# Patient Record
Sex: Male | Born: 1955 | Race: Black or African American | Hispanic: No | Marital: Single | Smoking: Never smoker
Health system: Southern US, Community
[De-identification: ages and names within clinical notes are randomized; demographics above are authoritative.]

## PROBLEM LIST (undated history)

## (undated) DIAGNOSIS — K219 Gastro-esophageal reflux disease without esophagitis: Secondary | ICD-10-CM

## (undated) DIAGNOSIS — G709 Myoneural disorder, unspecified: Secondary | ICD-10-CM

## (undated) DIAGNOSIS — E119 Type 2 diabetes mellitus without complications: Secondary | ICD-10-CM

## (undated) DIAGNOSIS — E785 Hyperlipidemia, unspecified: Secondary | ICD-10-CM

## (undated) DIAGNOSIS — M109 Gout, unspecified: Secondary | ICD-10-CM

## (undated) DIAGNOSIS — I10 Essential (primary) hypertension: Secondary | ICD-10-CM

## (undated) DIAGNOSIS — B20 Human immunodeficiency virus [HIV] disease: Secondary | ICD-10-CM

## (undated) DIAGNOSIS — G629 Polyneuropathy, unspecified: Secondary | ICD-10-CM

## (undated) DIAGNOSIS — R7301 Impaired fasting glucose: Secondary | ICD-10-CM

## (undated) DIAGNOSIS — R011 Cardiac murmur, unspecified: Secondary | ICD-10-CM

## (undated) DIAGNOSIS — I440 Atrioventricular block, first degree: Secondary | ICD-10-CM

## (undated) DIAGNOSIS — R739 Hyperglycemia, unspecified: Secondary | ICD-10-CM

## (undated) DIAGNOSIS — K429 Umbilical hernia without obstruction or gangrene: Secondary | ICD-10-CM

## (undated) DIAGNOSIS — N189 Chronic kidney disease, unspecified: Secondary | ICD-10-CM

## (undated) DIAGNOSIS — N342 Other urethritis: Secondary | ICD-10-CM

## (undated) DIAGNOSIS — Q8901 Asplenia (congenital): Secondary | ICD-10-CM

## (undated) HISTORY — DX: Asplenia (congenital): Q89.01

## (undated) HISTORY — DX: Human immunodeficiency virus (HIV) disease: B20

## (undated) HISTORY — DX: Impaired fasting glucose: R73.01

## (undated) HISTORY — DX: Hyperglycemia, unspecified: R73.9

## (undated) HISTORY — DX: Other urethritis: N34.2

## (undated) HISTORY — PX: COLONOSCOPY: SHX174

## (undated) HISTORY — DX: Hyperlipidemia, unspecified: E78.5

## (undated) HISTORY — DX: Type 2 diabetes mellitus without complications: E11.9

## (undated) HISTORY — DX: Chronic kidney disease, unspecified: N18.9

## (undated) HISTORY — DX: Gastro-esophageal reflux disease without esophagitis: K21.9

## (undated) HISTORY — DX: Essential (primary) hypertension: I10

## (undated) HISTORY — DX: Myoneural disorder, unspecified: G70.9

---

## 1991-11-24 ENCOUNTER — Encounter (INDEPENDENT_AMBULATORY_CARE_PROVIDER_SITE_OTHER): Payer: Self-pay | Admitting: *Deleted

## 1991-11-24 ENCOUNTER — Encounter: Payer: Self-pay | Admitting: Infectious Disease

## 1991-11-24 LAB — CONVERTED CEMR LAB
CD4 Count: 30 microliters
CD4 T Cell Abs: 30

## 1992-01-24 DIAGNOSIS — Z21 Asymptomatic human immunodeficiency virus [HIV] infection status: Secondary | ICD-10-CM

## 1992-01-24 DIAGNOSIS — B20 Human immunodeficiency virus [HIV] disease: Secondary | ICD-10-CM

## 1992-01-24 HISTORY — DX: Human immunodeficiency virus (HIV) disease: B20

## 1992-01-24 HISTORY — DX: Asymptomatic human immunodeficiency virus (hiv) infection status: Z21

## 1997-05-06 ENCOUNTER — Encounter: Admission: RE | Admit: 1997-05-06 | Discharge: 1997-05-06 | Payer: Self-pay | Admitting: Infectious Diseases

## 1997-05-18 ENCOUNTER — Encounter: Admission: RE | Admit: 1997-05-18 | Discharge: 1997-05-18 | Payer: Self-pay | Admitting: Infectious Diseases

## 1997-06-03 ENCOUNTER — Encounter: Admission: RE | Admit: 1997-06-03 | Discharge: 1997-06-03 | Payer: Self-pay | Admitting: Infectious Diseases

## 1997-08-12 ENCOUNTER — Encounter: Admission: RE | Admit: 1997-08-12 | Discharge: 1997-08-12 | Payer: Self-pay | Admitting: Infectious Diseases

## 1997-08-26 ENCOUNTER — Encounter: Admission: RE | Admit: 1997-08-26 | Discharge: 1997-08-26 | Payer: Self-pay | Admitting: Infectious Diseases

## 1997-10-07 ENCOUNTER — Encounter: Admission: RE | Admit: 1997-10-07 | Discharge: 1997-10-07 | Payer: Self-pay | Admitting: Infectious Diseases

## 1997-10-28 ENCOUNTER — Encounter: Admission: RE | Admit: 1997-10-28 | Discharge: 1997-10-28 | Payer: Self-pay | Admitting: Infectious Diseases

## 1997-12-09 ENCOUNTER — Encounter: Admission: RE | Admit: 1997-12-09 | Discharge: 1997-12-09 | Payer: Self-pay | Admitting: Infectious Diseases

## 1998-01-23 HISTORY — PX: SPLENECTOMY: SUR1306

## 1998-01-25 ENCOUNTER — Encounter: Admission: RE | Admit: 1998-01-25 | Discharge: 1998-01-25 | Payer: Self-pay | Admitting: Infectious Diseases

## 1998-02-22 ENCOUNTER — Encounter: Admission: RE | Admit: 1998-02-22 | Discharge: 1998-02-22 | Payer: Self-pay | Admitting: Infectious Diseases

## 1998-04-12 ENCOUNTER — Encounter: Admission: RE | Admit: 1998-04-12 | Discharge: 1998-04-12 | Payer: Self-pay | Admitting: Infectious Diseases

## 1998-05-31 ENCOUNTER — Ambulatory Visit (HOSPITAL_COMMUNITY): Admission: RE | Admit: 1998-05-31 | Discharge: 1998-05-31 | Payer: Self-pay | Admitting: Infectious Diseases

## 1998-06-14 ENCOUNTER — Encounter: Admission: RE | Admit: 1998-06-14 | Discharge: 1998-06-14 | Payer: Self-pay | Admitting: Infectious Diseases

## 1998-08-24 ENCOUNTER — Encounter: Admission: RE | Admit: 1998-08-24 | Discharge: 1998-08-24 | Payer: Self-pay | Admitting: Infectious Diseases

## 1998-08-24 ENCOUNTER — Ambulatory Visit (HOSPITAL_COMMUNITY): Admission: RE | Admit: 1998-08-24 | Discharge: 1998-08-24 | Payer: Self-pay | Admitting: Infectious Diseases

## 1998-09-06 ENCOUNTER — Encounter: Admission: RE | Admit: 1998-09-06 | Discharge: 1998-09-06 | Payer: Self-pay | Admitting: Internal Medicine

## 1998-11-04 ENCOUNTER — Ambulatory Visit (HOSPITAL_COMMUNITY): Admission: RE | Admit: 1998-11-04 | Discharge: 1998-11-04 | Payer: Self-pay | Admitting: Infectious Diseases

## 1998-11-04 ENCOUNTER — Encounter: Admission: RE | Admit: 1998-11-04 | Discharge: 1998-11-04 | Payer: Self-pay | Admitting: Hematology and Oncology

## 1998-11-22 ENCOUNTER — Encounter: Admission: RE | Admit: 1998-11-22 | Discharge: 1998-11-22 | Payer: Self-pay | Admitting: Infectious Diseases

## 1999-01-24 HISTORY — PX: LIPOSUCTION HEAD / NECK: SUR831

## 1999-01-31 ENCOUNTER — Ambulatory Visit (HOSPITAL_COMMUNITY): Admission: RE | Admit: 1999-01-31 | Discharge: 1999-01-31 | Payer: Self-pay | Admitting: Internal Medicine

## 1999-01-31 ENCOUNTER — Encounter: Admission: RE | Admit: 1999-01-31 | Discharge: 1999-01-31 | Payer: Self-pay | Admitting: Infectious Diseases

## 1999-03-21 ENCOUNTER — Encounter: Admission: RE | Admit: 1999-03-21 | Discharge: 1999-03-21 | Payer: Self-pay | Admitting: Infectious Diseases

## 1999-03-23 ENCOUNTER — Encounter: Admission: RE | Admit: 1999-03-23 | Discharge: 1999-03-23 | Payer: Self-pay | Admitting: Infectious Diseases

## 1999-04-18 ENCOUNTER — Ambulatory Visit (HOSPITAL_COMMUNITY): Admission: RE | Admit: 1999-04-18 | Discharge: 1999-04-18 | Payer: Self-pay | Admitting: Infectious Diseases

## 1999-04-18 ENCOUNTER — Encounter: Admission: RE | Admit: 1999-04-18 | Discharge: 1999-04-18 | Payer: Self-pay | Admitting: Infectious Diseases

## 1999-05-11 ENCOUNTER — Encounter: Admission: RE | Admit: 1999-05-11 | Discharge: 1999-05-11 | Payer: Self-pay | Admitting: Infectious Diseases

## 1999-07-18 ENCOUNTER — Ambulatory Visit (HOSPITAL_COMMUNITY): Admission: RE | Admit: 1999-07-18 | Discharge: 1999-07-18 | Payer: Self-pay | Admitting: Infectious Diseases

## 1999-07-18 ENCOUNTER — Encounter: Admission: RE | Admit: 1999-07-18 | Discharge: 1999-07-18 | Payer: Self-pay | Admitting: Infectious Diseases

## 1999-08-01 ENCOUNTER — Encounter: Admission: RE | Admit: 1999-08-01 | Discharge: 1999-08-01 | Payer: Self-pay | Admitting: Infectious Diseases

## 1999-09-28 ENCOUNTER — Encounter: Admission: RE | Admit: 1999-09-28 | Discharge: 1999-09-28 | Payer: Self-pay | Admitting: Infectious Diseases

## 1999-09-28 ENCOUNTER — Ambulatory Visit (HOSPITAL_COMMUNITY): Admission: RE | Admit: 1999-09-28 | Discharge: 1999-09-28 | Payer: Self-pay | Admitting: Infectious Diseases

## 1999-10-10 ENCOUNTER — Encounter: Admission: RE | Admit: 1999-10-10 | Discharge: 1999-10-10 | Payer: Self-pay | Admitting: Infectious Diseases

## 1999-11-21 ENCOUNTER — Encounter: Admission: RE | Admit: 1999-11-21 | Discharge: 1999-11-21 | Payer: Self-pay | Admitting: Infectious Diseases

## 1999-12-12 ENCOUNTER — Ambulatory Visit (HOSPITAL_COMMUNITY): Admission: RE | Admit: 1999-12-12 | Discharge: 1999-12-12 | Payer: Self-pay | Admitting: Infectious Diseases

## 1999-12-12 ENCOUNTER — Encounter: Admission: RE | Admit: 1999-12-12 | Discharge: 1999-12-12 | Payer: Self-pay | Admitting: Infectious Diseases

## 1999-12-26 ENCOUNTER — Encounter: Admission: RE | Admit: 1999-12-26 | Discharge: 1999-12-26 | Payer: Self-pay | Admitting: Infectious Diseases

## 2000-01-02 ENCOUNTER — Encounter: Admission: RE | Admit: 2000-01-02 | Discharge: 2000-01-02 | Payer: Self-pay | Admitting: Infectious Diseases

## 2000-01-09 ENCOUNTER — Encounter: Admission: RE | Admit: 2000-01-09 | Discharge: 2000-01-09 | Payer: Self-pay | Admitting: Internal Medicine

## 2000-01-18 ENCOUNTER — Encounter: Admission: RE | Admit: 2000-01-18 | Discharge: 2000-01-18 | Payer: Self-pay | Admitting: Internal Medicine

## 2000-02-06 ENCOUNTER — Ambulatory Visit (HOSPITAL_COMMUNITY): Admission: RE | Admit: 2000-02-06 | Discharge: 2000-02-06 | Payer: Self-pay | Admitting: Infectious Diseases

## 2000-02-06 ENCOUNTER — Encounter: Admission: RE | Admit: 2000-02-06 | Discharge: 2000-02-06 | Payer: Self-pay | Admitting: Infectious Diseases

## 2000-02-20 ENCOUNTER — Encounter: Admission: RE | Admit: 2000-02-20 | Discharge: 2000-02-20 | Payer: Self-pay | Admitting: Infectious Diseases

## 2000-04-23 ENCOUNTER — Ambulatory Visit (HOSPITAL_COMMUNITY): Admission: RE | Admit: 2000-04-23 | Discharge: 2000-04-23 | Payer: Self-pay | Admitting: Infectious Diseases

## 2000-04-23 ENCOUNTER — Encounter: Admission: RE | Admit: 2000-04-23 | Discharge: 2000-04-23 | Payer: Self-pay | Admitting: Internal Medicine

## 2000-05-07 ENCOUNTER — Encounter: Admission: RE | Admit: 2000-05-07 | Discharge: 2000-05-07 | Payer: Self-pay | Admitting: Infectious Diseases

## 2000-08-06 ENCOUNTER — Ambulatory Visit (HOSPITAL_COMMUNITY): Admission: RE | Admit: 2000-08-06 | Discharge: 2000-08-06 | Payer: Self-pay | Admitting: Infectious Diseases

## 2000-08-06 ENCOUNTER — Encounter: Admission: RE | Admit: 2000-08-06 | Discharge: 2000-08-06 | Payer: Self-pay | Admitting: Infectious Diseases

## 2000-08-27 ENCOUNTER — Encounter: Admission: RE | Admit: 2000-08-27 | Discharge: 2000-08-27 | Payer: Self-pay | Admitting: Infectious Diseases

## 2000-10-22 ENCOUNTER — Encounter: Admission: RE | Admit: 2000-10-22 | Discharge: 2000-10-22 | Payer: Self-pay | Admitting: Infectious Diseases

## 2000-10-22 ENCOUNTER — Ambulatory Visit (HOSPITAL_COMMUNITY): Admission: RE | Admit: 2000-10-22 | Discharge: 2000-10-22 | Payer: Self-pay | Admitting: Infectious Diseases

## 2000-11-05 ENCOUNTER — Encounter: Admission: RE | Admit: 2000-11-05 | Discharge: 2000-11-05 | Payer: Self-pay | Admitting: Infectious Diseases

## 2000-11-27 ENCOUNTER — Encounter: Admission: RE | Admit: 2000-11-27 | Discharge: 2000-11-27 | Payer: Self-pay | Admitting: Infectious Diseases

## 2000-11-27 ENCOUNTER — Ambulatory Visit (HOSPITAL_COMMUNITY): Admission: RE | Admit: 2000-11-27 | Discharge: 2000-11-27 | Payer: Self-pay | Admitting: Infectious Diseases

## 2001-01-08 ENCOUNTER — Encounter: Admission: RE | Admit: 2001-01-08 | Discharge: 2001-01-08 | Payer: Self-pay | Admitting: Infectious Diseases

## 2001-02-19 ENCOUNTER — Ambulatory Visit (HOSPITAL_COMMUNITY): Admission: RE | Admit: 2001-02-19 | Discharge: 2001-02-19 | Payer: Self-pay | Admitting: Infectious Diseases

## 2001-03-26 ENCOUNTER — Encounter: Admission: RE | Admit: 2001-03-26 | Discharge: 2001-03-26 | Payer: Self-pay | Admitting: Infectious Diseases

## 2001-05-06 ENCOUNTER — Encounter: Admission: RE | Admit: 2001-05-06 | Discharge: 2001-05-06 | Payer: Self-pay | Admitting: Infectious Diseases

## 2001-05-06 ENCOUNTER — Ambulatory Visit (HOSPITAL_COMMUNITY): Admission: RE | Admit: 2001-05-06 | Discharge: 2001-05-06 | Payer: Self-pay | Admitting: Infectious Diseases

## 2001-07-02 ENCOUNTER — Encounter: Admission: RE | Admit: 2001-07-02 | Discharge: 2001-07-02 | Payer: Self-pay | Admitting: Infectious Diseases

## 2001-09-30 ENCOUNTER — Encounter: Admission: RE | Admit: 2001-09-30 | Discharge: 2001-09-30 | Payer: Self-pay | Admitting: Infectious Diseases

## 2001-09-30 ENCOUNTER — Ambulatory Visit (HOSPITAL_COMMUNITY): Admission: RE | Admit: 2001-09-30 | Discharge: 2001-09-30 | Payer: Self-pay | Admitting: Infectious Diseases

## 2001-10-14 ENCOUNTER — Encounter: Admission: RE | Admit: 2001-10-14 | Discharge: 2001-10-14 | Payer: Self-pay | Admitting: Infectious Diseases

## 2001-11-08 ENCOUNTER — Encounter: Admission: RE | Admit: 2001-11-08 | Discharge: 2001-11-08 | Payer: Self-pay | Admitting: Internal Medicine

## 2002-01-13 ENCOUNTER — Ambulatory Visit (HOSPITAL_COMMUNITY): Admission: RE | Admit: 2002-01-13 | Discharge: 2002-01-13 | Payer: Self-pay | Admitting: Infectious Diseases

## 2002-01-13 ENCOUNTER — Encounter: Admission: RE | Admit: 2002-01-13 | Discharge: 2002-01-13 | Payer: Self-pay | Admitting: Infectious Diseases

## 2002-02-10 ENCOUNTER — Encounter: Admission: RE | Admit: 2002-02-10 | Discharge: 2002-02-10 | Payer: Self-pay | Admitting: Infectious Diseases

## 2002-06-10 ENCOUNTER — Encounter: Admission: RE | Admit: 2002-06-10 | Discharge: 2002-06-10 | Payer: Self-pay | Admitting: Infectious Diseases

## 2002-06-10 ENCOUNTER — Encounter (INDEPENDENT_AMBULATORY_CARE_PROVIDER_SITE_OTHER): Payer: Self-pay | Admitting: Infectious Diseases

## 2002-06-30 ENCOUNTER — Encounter: Admission: RE | Admit: 2002-06-30 | Discharge: 2002-06-30 | Payer: Self-pay | Admitting: Infectious Diseases

## 2002-10-06 ENCOUNTER — Encounter: Admission: RE | Admit: 2002-10-06 | Discharge: 2002-10-06 | Payer: Self-pay | Admitting: Infectious Diseases

## 2002-10-06 ENCOUNTER — Ambulatory Visit (HOSPITAL_COMMUNITY): Admission: RE | Admit: 2002-10-06 | Discharge: 2002-10-06 | Payer: Self-pay | Admitting: Infectious Diseases

## 2002-10-06 ENCOUNTER — Encounter (INDEPENDENT_AMBULATORY_CARE_PROVIDER_SITE_OTHER): Payer: Self-pay | Admitting: Infectious Diseases

## 2002-10-13 ENCOUNTER — Encounter: Admission: RE | Admit: 2002-10-13 | Discharge: 2002-10-13 | Payer: Self-pay | Admitting: Infectious Diseases

## 2002-11-07 ENCOUNTER — Encounter: Admission: RE | Admit: 2002-11-07 | Discharge: 2002-11-07 | Payer: Self-pay | Admitting: Internal Medicine

## 2003-01-12 ENCOUNTER — Encounter (INDEPENDENT_AMBULATORY_CARE_PROVIDER_SITE_OTHER): Payer: Self-pay | Admitting: Infectious Diseases

## 2003-01-12 ENCOUNTER — Encounter: Admission: RE | Admit: 2003-01-12 | Discharge: 2003-01-12 | Payer: Self-pay | Admitting: Infectious Diseases

## 2003-01-12 ENCOUNTER — Ambulatory Visit (HOSPITAL_COMMUNITY): Admission: RE | Admit: 2003-01-12 | Discharge: 2003-01-12 | Payer: Self-pay | Admitting: Infectious Diseases

## 2003-01-26 ENCOUNTER — Encounter: Admission: RE | Admit: 2003-01-26 | Discharge: 2003-01-26 | Payer: Self-pay | Admitting: Infectious Diseases

## 2003-05-20 ENCOUNTER — Ambulatory Visit (HOSPITAL_COMMUNITY): Admission: RE | Admit: 2003-05-20 | Discharge: 2003-05-20 | Payer: Self-pay | Admitting: Infectious Diseases

## 2003-06-17 ENCOUNTER — Encounter: Admission: RE | Admit: 2003-06-17 | Discharge: 2003-06-17 | Payer: Self-pay | Admitting: Infectious Diseases

## 2003-10-26 ENCOUNTER — Ambulatory Visit (HOSPITAL_COMMUNITY): Admission: RE | Admit: 2003-10-26 | Discharge: 2003-10-26 | Payer: Self-pay | Admitting: Infectious Diseases

## 2003-10-26 ENCOUNTER — Ambulatory Visit: Payer: Self-pay | Admitting: Infectious Diseases

## 2003-11-09 ENCOUNTER — Ambulatory Visit: Payer: Self-pay | Admitting: Infectious Diseases

## 2004-03-21 ENCOUNTER — Ambulatory Visit (HOSPITAL_COMMUNITY): Admission: RE | Admit: 2004-03-21 | Discharge: 2004-03-21 | Payer: Self-pay | Admitting: Infectious Diseases

## 2004-03-21 ENCOUNTER — Ambulatory Visit: Payer: Self-pay | Admitting: Infectious Diseases

## 2004-04-04 ENCOUNTER — Ambulatory Visit: Payer: Self-pay | Admitting: Infectious Diseases

## 2004-10-10 ENCOUNTER — Encounter (INDEPENDENT_AMBULATORY_CARE_PROVIDER_SITE_OTHER): Payer: Self-pay | Admitting: *Deleted

## 2004-10-10 ENCOUNTER — Ambulatory Visit: Payer: Self-pay | Admitting: Infectious Diseases

## 2004-10-10 ENCOUNTER — Ambulatory Visit (HOSPITAL_COMMUNITY): Admission: RE | Admit: 2004-10-10 | Discharge: 2004-10-10 | Payer: Self-pay | Admitting: Infectious Diseases

## 2004-10-10 LAB — CONVERTED CEMR LAB
CD4 Count: 800 uL
HIV 1 RNA Quant: 153 {copies}/mL

## 2004-10-24 ENCOUNTER — Ambulatory Visit: Payer: Self-pay | Admitting: Infectious Diseases

## 2004-11-29 ENCOUNTER — Emergency Department: Payer: Self-pay | Admitting: Emergency Medicine

## 2005-03-13 ENCOUNTER — Ambulatory Visit: Payer: Self-pay | Admitting: Infectious Diseases

## 2005-03-13 ENCOUNTER — Encounter (INDEPENDENT_AMBULATORY_CARE_PROVIDER_SITE_OTHER): Payer: Self-pay | Admitting: *Deleted

## 2005-03-13 LAB — CONVERTED CEMR LAB
CD4 Count: 860 microliters
HIV 1 RNA Quant: 399 copies/mL

## 2005-03-27 ENCOUNTER — Ambulatory Visit: Payer: Self-pay | Admitting: Infectious Diseases

## 2005-04-17 ENCOUNTER — Ambulatory Visit: Payer: Self-pay | Admitting: Infectious Diseases

## 2005-09-18 ENCOUNTER — Encounter (INDEPENDENT_AMBULATORY_CARE_PROVIDER_SITE_OTHER): Payer: Self-pay | Admitting: *Deleted

## 2005-09-18 ENCOUNTER — Ambulatory Visit: Payer: Self-pay | Admitting: Infectious Diseases

## 2005-09-18 ENCOUNTER — Encounter: Admission: RE | Admit: 2005-09-18 | Discharge: 2005-09-18 | Payer: Self-pay | Admitting: Infectious Diseases

## 2005-09-18 LAB — CONVERTED CEMR LAB
CD4 Count: 700 microliters
HIV 1 RNA Quant: 127 copies/mL

## 2005-10-02 ENCOUNTER — Ambulatory Visit: Payer: Self-pay | Admitting: Infectious Diseases

## 2005-10-03 ENCOUNTER — Emergency Department (HOSPITAL_COMMUNITY): Admission: EM | Admit: 2005-10-03 | Discharge: 2005-10-04 | Payer: Self-pay | Admitting: Emergency Medicine

## 2005-10-06 ENCOUNTER — Ambulatory Visit: Payer: Self-pay | Admitting: Internal Medicine

## 2005-10-06 ENCOUNTER — Encounter (INDEPENDENT_AMBULATORY_CARE_PROVIDER_SITE_OTHER): Payer: Self-pay | Admitting: Infectious Diseases

## 2006-03-19 ENCOUNTER — Encounter (INDEPENDENT_AMBULATORY_CARE_PROVIDER_SITE_OTHER): Payer: Self-pay | Admitting: *Deleted

## 2006-03-19 LAB — CONVERTED CEMR LAB: RPR Titer: 1:4 {titer}

## 2006-04-01 ENCOUNTER — Encounter (INDEPENDENT_AMBULATORY_CARE_PROVIDER_SITE_OTHER): Payer: Self-pay | Admitting: *Deleted

## 2006-04-02 ENCOUNTER — Telehealth (INDEPENDENT_AMBULATORY_CARE_PROVIDER_SITE_OTHER): Payer: Self-pay | Admitting: Infectious Diseases

## 2006-04-02 ENCOUNTER — Ambulatory Visit: Payer: Self-pay | Admitting: Infectious Diseases

## 2006-04-02 ENCOUNTER — Encounter: Admission: RE | Admit: 2006-04-02 | Discharge: 2006-04-02 | Payer: Self-pay | Admitting: Infectious Diseases

## 2006-04-16 ENCOUNTER — Ambulatory Visit (HOSPITAL_COMMUNITY): Admission: RE | Admit: 2006-04-16 | Discharge: 2006-04-16 | Payer: Self-pay | Admitting: Infectious Diseases

## 2006-04-16 ENCOUNTER — Ambulatory Visit: Payer: Self-pay | Admitting: Infectious Diseases

## 2006-04-16 DIAGNOSIS — B20 Human immunodeficiency virus [HIV] disease: Secondary | ICD-10-CM | POA: Insufficient documentation

## 2006-10-01 ENCOUNTER — Ambulatory Visit: Payer: Self-pay | Admitting: Infectious Disease

## 2006-10-01 ENCOUNTER — Encounter: Admission: RE | Admit: 2006-10-01 | Discharge: 2006-10-01 | Payer: Self-pay | Admitting: Infectious Disease

## 2006-10-01 LAB — CONVERTED CEMR LAB
ALT: 14 units/L (ref 0–53)
AST: 19 units/L (ref 0–37)
Albumin: 4 g/dL (ref 3.5–5.2)
Alkaline Phosphatase: 65 units/L (ref 39–117)
BUN: 16 mg/dL (ref 6–23)
Basophils Absolute: 0 10*3/uL (ref 0.0–0.1)
Basophils Relative: 1 % (ref 0–1)
CO2: 25 meq/L (ref 19–32)
Calcium: 9 mg/dL (ref 8.4–10.5)
Chloride: 104 meq/L (ref 96–112)
Creatinine, Ser: 1.3 mg/dL (ref 0.40–1.50)
Eosinophils Absolute: 0.2 10*3/uL (ref 0.0–0.7)
Eosinophils Relative: 3 % (ref 0–5)
Glucose, Bld: 97 mg/dL (ref 70–99)
HCT: 41.1 % (ref 39.0–52.0)
HIV 1 RNA Quant: 654 copies/mL — ABNORMAL HIGH (ref <50)
HIV-1 RNA Quant, Log: 2.82 — ABNORMAL HIGH (ref <1.70)
Hemoglobin: 13.4 g/dL (ref 13.0–17.0)
Lymphocytes Relative: 53 % — ABNORMAL HIGH (ref 12–46)
Lymphs Abs: 3.2 10*3/uL (ref 0.7–3.3)
MCHC: 32.6 g/dL (ref 30.0–36.0)
MCV: 99 fL (ref 78.0–100.0)
Monocytes Absolute: 0.7 10*3/uL (ref 0.2–0.7)
Monocytes Relative: 11 % (ref 3–11)
Neutro Abs: 2 10*3/uL (ref 1.7–7.7)
Neutrophils Relative %: 33 % — ABNORMAL LOW (ref 43–77)
Platelets: 473 10*3/uL — ABNORMAL HIGH (ref 150–400)
Potassium: 3.7 meq/L (ref 3.5–5.3)
RBC: 4.15 M/uL — ABNORMAL LOW (ref 4.22–5.81)
RDW: 14.3 % — ABNORMAL HIGH (ref 11.5–14.0)
Sodium: 141 meq/L (ref 135–145)
Total Bilirubin: 0.4 mg/dL (ref 0.3–1.2)
Total Protein: 8 g/dL (ref 6.0–8.3)
WBC: 6 10*3/uL (ref 4.0–10.5)

## 2006-10-08 ENCOUNTER — Encounter (INDEPENDENT_AMBULATORY_CARE_PROVIDER_SITE_OTHER): Payer: Self-pay | Admitting: *Deleted

## 2006-10-15 ENCOUNTER — Ambulatory Visit: Payer: Self-pay | Admitting: Infectious Disease

## 2006-10-15 DIAGNOSIS — N259 Disorder resulting from impaired renal tubular function, unspecified: Secondary | ICD-10-CM | POA: Insufficient documentation

## 2006-10-15 DIAGNOSIS — I10 Essential (primary) hypertension: Secondary | ICD-10-CM | POA: Insufficient documentation

## 2006-10-15 LAB — CONVERTED CEMR LAB
Creatinine, Urine: 161.6 mg/dL
HIV 1 RNA Quant: 284 copies/mL — ABNORMAL HIGH (ref <50)
HIV-1 RNA Quant, Log: 2.45 — ABNORMAL HIGH (ref <1.70)
Microalb Creat Ratio: 14.4 mg/g (ref 0.0–30.0)
Microalb, Ur: 2.32 mg/dL — ABNORMAL HIGH (ref 0.00–1.89)

## 2006-12-07 LAB — HM COLONOSCOPY: HM Colonoscopy: NORMAL

## 2006-12-14 ENCOUNTER — Encounter: Admission: RE | Admit: 2006-12-14 | Discharge: 2006-12-14 | Payer: Self-pay | Admitting: Infectious Disease

## 2006-12-14 ENCOUNTER — Ambulatory Visit: Payer: Self-pay | Admitting: Infectious Disease

## 2006-12-14 LAB — CONVERTED CEMR LAB
ALT: 15 units/L (ref 0–53)
AST: 19 units/L (ref 0–37)
Albumin: 4.1 g/dL (ref 3.5–5.2)
Alkaline Phosphatase: 79 units/L (ref 39–117)
BUN: 15 mg/dL (ref 6–23)
Basophils Absolute: 0.1 10*3/uL (ref 0.0–0.1)
Basophils Relative: 1 % (ref 0–1)
CO2: 25 meq/L (ref 19–32)
Calcium: 9.1 mg/dL (ref 8.4–10.5)
Chloride: 101 meq/L (ref 96–112)
Cholesterol: 149 mg/dL (ref 0–200)
Creatinine, Ser: 1.25 mg/dL (ref 0.40–1.50)
Eosinophils Absolute: 0.5 10*3/uL (ref 0.2–0.7)
Eosinophils Relative: 8 % — ABNORMAL HIGH (ref 0–5)
Glucose, Bld: 92 mg/dL (ref 70–99)
HCT: 42.3 % (ref 39.0–52.0)
HDL: 35 mg/dL — ABNORMAL LOW (ref >39)
HIV 1 RNA Quant: <50 copies/mL (ref <50)
HIV-1 RNA Quant, Log: <1.7 (ref <1.70)
Hemoglobin: 14 g/dL (ref 13.0–17.0)
LDL Cholesterol: 94 mg/dL (ref 0–99)
Lymphocytes Relative: 44 % (ref 12–46)
Lymphs Abs: 2.9 10*3/uL (ref 0.7–4.0)
MCHC: 33.1 g/dL (ref 30.0–36.0)
MCV: 100.2 fL — ABNORMAL HIGH (ref 78.0–100.0)
Monocytes Absolute: 1 10*3/uL (ref 0.1–1.0)
Monocytes Relative: 15 % — ABNORMAL HIGH (ref 3–12)
Neutro Abs: 2.1 10*3/uL (ref 1.7–7.7)
Neutrophils Relative %: 32 % — ABNORMAL LOW (ref 43–77)
Platelets: 413 10*3/uL — ABNORMAL HIGH (ref 150–400)
Potassium: 4.4 meq/L (ref 3.5–5.3)
RBC: 4.22 M/uL (ref 4.22–5.81)
RDW: 14.8 % (ref 11.5–15.5)
Sodium: 138 meq/L (ref 135–145)
Total Bilirubin: 0.5 mg/dL (ref 0.3–1.2)
Total CHOL/HDL Ratio: 4.3
Total Protein: 8.4 g/dL — ABNORMAL HIGH (ref 6.0–8.3)
Triglycerides: 99 mg/dL (ref <150)
VLDL: 20 mg/dL (ref 0–40)
WBC: 6.5 10*3/uL (ref 4.0–10.5)

## 2006-12-31 ENCOUNTER — Ambulatory Visit: Payer: Self-pay | Admitting: Infectious Disease

## 2006-12-31 DIAGNOSIS — F528 Other sexual dysfunction not due to a substance or known physiological condition: Secondary | ICD-10-CM | POA: Insufficient documentation

## 2007-01-07 ENCOUNTER — Ambulatory Visit: Payer: Self-pay | Admitting: Infectious Disease

## 2007-01-07 LAB — CONVERTED CEMR LAB
BUN: 18 mg/dL (ref 6–23)
CO2: 23 meq/L (ref 19–32)
Calcium: 9.2 mg/dL (ref 8.4–10.5)
Chloride: 104 meq/L (ref 96–112)
Creatinine, Ser: 1.22 mg/dL (ref 0.40–1.50)
Glucose, Bld: 83 mg/dL (ref 70–99)
Potassium: 4.6 meq/L (ref 3.5–5.3)
Sodium: 139 meq/L (ref 135–145)

## 2007-04-24 ENCOUNTER — Telehealth (INDEPENDENT_AMBULATORY_CARE_PROVIDER_SITE_OTHER): Payer: Self-pay | Admitting: *Deleted

## 2007-04-29 ENCOUNTER — Encounter (INDEPENDENT_AMBULATORY_CARE_PROVIDER_SITE_OTHER): Payer: Self-pay | Admitting: *Deleted

## 2007-05-13 ENCOUNTER — Telehealth (INDEPENDENT_AMBULATORY_CARE_PROVIDER_SITE_OTHER): Payer: Self-pay | Admitting: *Deleted

## 2007-05-13 ENCOUNTER — Encounter (INDEPENDENT_AMBULATORY_CARE_PROVIDER_SITE_OTHER): Payer: Self-pay | Admitting: *Deleted

## 2007-06-25 ENCOUNTER — Ambulatory Visit: Payer: Self-pay | Admitting: Infectious Disease

## 2007-06-25 ENCOUNTER — Encounter: Admission: RE | Admit: 2007-06-25 | Discharge: 2007-06-25 | Payer: Self-pay | Admitting: Infectious Disease

## 2007-06-25 LAB — CONVERTED CEMR LAB
ALT: 22 units/L (ref 0–53)
AST: 25 units/L (ref 0–37)
Albumin: 4.3 g/dL (ref 3.5–5.2)
Alkaline Phosphatase: 58 units/L (ref 39–117)
BUN: 18 mg/dL (ref 6–23)
Basophils Absolute: 0.1 10*3/uL (ref 0.0–0.1)
Basophils Relative: 1 % (ref 0–1)
CO2: 22 meq/L (ref 19–32)
Calcium: 9.1 mg/dL (ref 8.4–10.5)
Chloride: 107 meq/L (ref 96–112)
Creatinine, Ser: 1.34 mg/dL (ref 0.40–1.50)
Eosinophils Absolute: 0.3 10*3/uL (ref 0.0–0.7)
Eosinophils Relative: 4 % (ref 0–5)
Glucose, Bld: 86 mg/dL (ref 70–99)
HCT: 39.2 % (ref 39.0–52.0)
HIV 1 RNA Quant: 127 copies/mL — ABNORMAL HIGH (ref <50)
HIV-1 RNA Quant, Log: 2.1 — ABNORMAL HIGH (ref <1.70)
Hemoglobin: 13 g/dL (ref 13.0–17.0)
Lymphocytes Relative: 53 % — ABNORMAL HIGH (ref 12–46)
Lymphs Abs: 4.4 10*3/uL — ABNORMAL HIGH (ref 0.7–4.0)
MCHC: 33.2 g/dL (ref 30.0–36.0)
MCV: 100.5 fL — ABNORMAL HIGH (ref 78.0–100.0)
Monocytes Absolute: 0.8 10*3/uL (ref 0.1–1.0)
Monocytes Relative: 9 % (ref 3–12)
Neutro Abs: 2.7 10*3/uL (ref 1.7–7.7)
Neutrophils Relative %: 33 % — ABNORMAL LOW (ref 43–77)
Platelets: 394 10*3/uL (ref 150–400)
Potassium: 4.2 meq/L (ref 3.5–5.3)
RBC: 3.9 M/uL — ABNORMAL LOW (ref 4.22–5.81)
RDW: 15.1 % (ref 11.5–15.5)
Sodium: 141 meq/L (ref 135–145)
Testosterone: 557.94 ng/dL (ref 350–890)
Total Bilirubin: 0.5 mg/dL (ref 0.3–1.2)
Total Protein: 7.7 g/dL (ref 6.0–8.3)
WBC: 8.3 10*3/uL (ref 4.0–10.5)

## 2007-07-15 ENCOUNTER — Ambulatory Visit: Payer: Self-pay | Admitting: Infectious Disease

## 2007-07-15 LAB — CONVERTED CEMR LAB
Chlamydia, Swab/Urine, PCR: NEGATIVE
GC Probe Amp, Urine: NEGATIVE

## 2007-09-06 ENCOUNTER — Encounter: Payer: Self-pay | Admitting: Infectious Disease

## 2007-10-14 ENCOUNTER — Encounter: Payer: Self-pay | Admitting: Infectious Disease

## 2007-10-21 ENCOUNTER — Ambulatory Visit: Payer: Self-pay | Admitting: Infectious Disease

## 2007-10-30 DIAGNOSIS — A528 Late syphilis, latent: Secondary | ICD-10-CM | POA: Insufficient documentation

## 2007-11-04 ENCOUNTER — Ambulatory Visit: Payer: Self-pay | Admitting: Infectious Disease

## 2007-11-05 ENCOUNTER — Ambulatory Visit (HOSPITAL_COMMUNITY): Admission: RE | Admit: 2007-11-05 | Discharge: 2007-11-05 | Payer: Self-pay | Admitting: Infectious Disease

## 2007-11-06 ENCOUNTER — Ambulatory Visit (HOSPITAL_COMMUNITY): Admission: RE | Admit: 2007-11-06 | Discharge: 2007-11-06 | Payer: Self-pay | Admitting: Infectious Disease

## 2007-11-13 ENCOUNTER — Telehealth (INDEPENDENT_AMBULATORY_CARE_PROVIDER_SITE_OTHER): Payer: Self-pay | Admitting: *Deleted

## 2007-11-18 ENCOUNTER — Ambulatory Visit: Payer: Self-pay | Admitting: Infectious Disease

## 2007-11-25 ENCOUNTER — Ambulatory Visit: Payer: Self-pay | Admitting: Infectious Disease

## 2007-12-02 ENCOUNTER — Ambulatory Visit: Payer: Self-pay | Admitting: Infectious Disease

## 2007-12-25 ENCOUNTER — Ambulatory Visit: Payer: Self-pay | Admitting: Infectious Disease

## 2007-12-25 LAB — CONVERTED CEMR LAB
ALT: 32 units/L (ref 0–53)
AST: 24 units/L (ref 0–37)
Albumin: 4.4 g/dL (ref 3.5–5.2)
Alkaline Phosphatase: 63 units/L (ref 39–117)
BUN: 15 mg/dL (ref 6–23)
Basophils Absolute: 0.1 10*3/uL (ref 0.0–0.1)
Basophils Relative: 1 % (ref 0–1)
CO2: 23 meq/L (ref 19–32)
Calcium: 9.9 mg/dL (ref 8.4–10.5)
Chloride: 105 meq/L (ref 96–112)
Creatinine, Ser: 1.21 mg/dL (ref 0.40–1.50)
Eosinophils Absolute: 0.6 10*3/uL (ref 0.0–0.7)
Eosinophils Relative: 8 % — ABNORMAL HIGH (ref 0–5)
Glucose, Bld: 85 mg/dL (ref 70–99)
HCT: 39.4 % (ref 39.0–52.0)
HIV 1 RNA Quant: 125 copies/mL — ABNORMAL HIGH (ref <48)
HIV-1 RNA Quant, Log: 2.1 — ABNORMAL HIGH (ref <1.68)
Hemoglobin: 13.3 g/dL (ref 13.0–17.0)
Lymphocytes Relative: 54 % — ABNORMAL HIGH (ref 12–46)
Lymphs Abs: 3.8 10*3/uL (ref 0.7–4.0)
MCHC: 33.8 g/dL (ref 30.0–36.0)
MCV: 100.5 fL — ABNORMAL HIGH (ref 78.0–100.0)
Monocytes Absolute: 0.9 10*3/uL (ref 0.1–1.0)
Monocytes Relative: 13 % — ABNORMAL HIGH (ref 3–12)
Neutro Abs: 1.7 10*3/uL (ref 1.7–7.7)
Neutrophils Relative %: 24 % — ABNORMAL LOW (ref 43–77)
Platelets: 351 10*3/uL (ref 150–400)
Potassium: 4.7 meq/L (ref 3.5–5.3)
RBC: 3.92 M/uL — ABNORMAL LOW (ref 4.22–5.81)
RDW: 14.6 % (ref 11.5–15.5)
Sodium: 138 meq/L (ref 135–145)
Total Bilirubin: 0.4 mg/dL (ref 0.3–1.2)
Total Protein: 7.6 g/dL (ref 6.0–8.3)
WBC: 7.1 10*3/uL (ref 4.0–10.5)

## 2008-01-02 ENCOUNTER — Ambulatory Visit: Payer: Self-pay | Admitting: Infectious Disease

## 2008-03-14 ENCOUNTER — Telehealth (INDEPENDENT_AMBULATORY_CARE_PROVIDER_SITE_OTHER): Payer: Self-pay | Admitting: Licensed Clinical Social Worker

## 2008-06-04 ENCOUNTER — Ambulatory Visit: Payer: Self-pay | Admitting: Infectious Disease

## 2008-06-04 LAB — CONVERTED CEMR LAB
ALT: 42 units/L (ref 0–53)
AST: 40 units/L — ABNORMAL HIGH (ref 0–37)
Albumin: 4.4 g/dL (ref 3.5–5.2)
Alkaline Phosphatase: 61 units/L (ref 39–117)
BUN: 15 mg/dL (ref 6–23)
Basophils Absolute: 0.1 10*3/uL (ref 0.0–0.1)
Basophils Relative: 1 % (ref 0–1)
CO2: 23 meq/L (ref 19–32)
Calcium: 9.5 mg/dL (ref 8.4–10.5)
Chloride: 106 meq/L (ref 96–112)
Cholesterol: 181 mg/dL (ref 0–200)
Creatinine, Ser: 1.33 mg/dL (ref 0.40–1.50)
Eosinophils Absolute: 0.4 10*3/uL (ref 0.0–0.7)
Eosinophils Relative: 5 % (ref 0–5)
GFR calc Af Amer: >60 mL/min (ref >60)
GFR calc non Af Amer: 57 mL/min — ABNORMAL LOW (ref >60)
Glucose, Bld: 91 mg/dL (ref 70–99)
HCT: 40.4 % (ref 39.0–52.0)
HDL: 35 mg/dL — ABNORMAL LOW (ref >39)
HIV 1 RNA Quant: 365 copies/mL — ABNORMAL HIGH (ref <48)
HIV-1 RNA Quant, Log: 2.56 — ABNORMAL HIGH (ref <1.68)
Hemoglobin: 13.9 g/dL (ref 13.0–17.0)
LDL Cholesterol: 125 mg/dL — ABNORMAL HIGH (ref 0–99)
Lymphocytes Relative: 60 % — ABNORMAL HIGH (ref 12–46)
Lymphs Abs: 4.8 10*3/uL — ABNORMAL HIGH (ref 0.7–4.0)
MCHC: 34.4 g/dL (ref 30.0–36.0)
MCV: 99.5 fL (ref 78.0–100.0)
Monocytes Absolute: 0.8 10*3/uL (ref 0.1–1.0)
Monocytes Relative: 10 % (ref 3–12)
Neutro Abs: 1.9 10*3/uL (ref 1.7–7.7)
Neutrophils Relative %: 24 % — ABNORMAL LOW (ref 43–77)
Platelets: 349 10*3/uL (ref 150–400)
Potassium: 4.4 meq/L (ref 3.5–5.3)
RBC: 4.06 M/uL — ABNORMAL LOW (ref 4.22–5.81)
RDW: 14.8 % (ref 11.5–15.5)
Sodium: 139 meq/L (ref 135–145)
Total Bilirubin: 0.3 mg/dL (ref 0.3–1.2)
Total CHOL/HDL Ratio: 5.2
Total Protein: 7.8 g/dL (ref 6.0–8.3)
Triglycerides: 106 mg/dL (ref <150)
VLDL: 21 mg/dL (ref 0–40)
WBC: 8 10*3/uL (ref 4.0–10.5)

## 2008-06-18 ENCOUNTER — Ambulatory Visit: Payer: Self-pay | Admitting: Infectious Disease

## 2008-06-18 DIAGNOSIS — R209 Unspecified disturbances of skin sensation: Secondary | ICD-10-CM | POA: Insufficient documentation

## 2008-06-18 LAB — CONVERTED CEMR LAB

## 2008-09-08 ENCOUNTER — Ambulatory Visit: Payer: Self-pay | Admitting: Infectious Disease

## 2008-09-08 LAB — CONVERTED CEMR LAB
ALT: 27 units/L (ref 0–53)
AST: 26 units/L (ref 0–37)
Albumin: 4.4 g/dL (ref 3.5–5.2)
Alkaline Phosphatase: 55 units/L (ref 39–117)
BUN: 16 mg/dL (ref 6–23)
Basophils Absolute: 0 10*3/uL (ref 0.0–0.1)
Basophils Relative: 0 % (ref 0–1)
CO2: 22 meq/L (ref 19–32)
Calcium: 9.8 mg/dL (ref 8.4–10.5)
Chloride: 106 meq/L (ref 96–112)
Cholesterol: 185 mg/dL (ref 0–200)
Creatinine, Ser: 1.31 mg/dL (ref 0.40–1.50)
Eosinophils Absolute: 0.3 10*3/uL (ref 0.0–0.7)
Eosinophils Relative: 3 % (ref 0–5)
Glucose, Bld: 83 mg/dL (ref 70–99)
HCT: 43.9 % (ref 39.0–52.0)
HDL: 39 mg/dL — ABNORMAL LOW (ref >39)
HIV 1 RNA Quant: 404 copies/mL — ABNORMAL HIGH (ref <48)
HIV-1 RNA Quant, Log: 2.61 — ABNORMAL HIGH (ref <1.68)
Hemoglobin: 14 g/dL (ref 13.0–17.0)
LDL Cholesterol: 129 mg/dL — ABNORMAL HIGH (ref 0–99)
Lymphocytes Relative: 45 % (ref 12–46)
Lymphs Abs: 4.3 10*3/uL — ABNORMAL HIGH (ref 0.7–4.0)
MCHC: 31.9 g/dL (ref 30.0–36.0)
MCV: 102.1 fL — ABNORMAL HIGH (ref >=78.0)
Monocytes Absolute: 0.9 10*3/uL (ref 0.1–1.0)
Monocytes Relative: 9 % (ref 3–12)
Neutro Abs: 4.1 10*3/uL (ref 1.7–7.7)
Neutrophils Relative %: 43 % (ref 43–77)
Platelets: 372 10*3/uL (ref 150–400)
Potassium: 5 meq/L (ref 3.5–5.3)
RBC: 4.3 M/uL (ref 4.22–5.81)
RDW: 14.6 % (ref 11.5–15.5)
Sodium: 141 meq/L (ref 135–145)
Total Bilirubin: 0.4 mg/dL (ref 0.3–1.2)
Total CHOL/HDL Ratio: 4.7
Total Protein: 7.8 g/dL (ref 6.0–8.3)
Triglycerides: 83 mg/dL (ref <150)
VLDL: 17 mg/dL (ref 0–40)
WBC: 9.5 10*3/uL (ref 4.0–10.5)

## 2008-09-21 ENCOUNTER — Ambulatory Visit: Payer: Self-pay | Admitting: Infectious Disease

## 2008-09-21 LAB — CONVERTED CEMR LAB
Chlamydia, Swab/Urine, PCR: NEGATIVE
GC Probe Amp, Urine: NEGATIVE

## 2008-09-29 ENCOUNTER — Ambulatory Visit: Payer: Self-pay | Admitting: Internal Medicine

## 2008-10-01 ENCOUNTER — Telehealth: Payer: Self-pay | Admitting: Internal Medicine

## 2008-10-13 ENCOUNTER — Ambulatory Visit: Payer: Self-pay | Admitting: Internal Medicine

## 2008-11-02 ENCOUNTER — Ambulatory Visit: Payer: Self-pay | Admitting: Infectious Disease

## 2008-11-02 LAB — CONVERTED CEMR LAB
ALT: 24 units/L (ref 0–53)
AST: 23 units/L (ref 0–37)
Albumin: 4.3 g/dL (ref 3.5–5.2)
Alkaline Phosphatase: 49 units/L (ref 39–117)
BUN: 19 mg/dL (ref 6–23)
Basophils Absolute: 0.1 10*3/uL (ref 0.0–0.1)
Basophils Relative: 1 % (ref 0–1)
CO2: 21 meq/L (ref 19–32)
Calcium: 9.5 mg/dL (ref 8.4–10.5)
Chloride: 106 meq/L (ref 96–112)
Creatinine, Ser: 1.35 mg/dL (ref 0.40–1.50)
Eosinophils Absolute: 0.4 10*3/uL (ref 0.0–0.7)
Eosinophils Relative: 6 % — ABNORMAL HIGH (ref 0–5)
Glucose, Bld: 148 mg/dL — ABNORMAL HIGH (ref 70–99)
HCT: 42.2 % (ref 39.0–52.0)
HIV 1 RNA Quant: 51 copies/mL — ABNORMAL HIGH (ref <48)
HIV-1 RNA Quant, Log: 1.71 — ABNORMAL HIGH (ref <1.68)
Hemoglobin: 14.1 g/dL (ref 13.0–17.0)
Lymphocytes Relative: 57 % — ABNORMAL HIGH (ref 12–46)
Lymphs Abs: 3.9 10*3/uL (ref 0.7–4.0)
MCHC: 33.4 g/dL (ref 30.0–36.0)
MCV: 99.1 fL (ref >=78.0)
Monocytes Absolute: 0.6 10*3/uL (ref 0.1–1.0)
Monocytes Relative: 9 % (ref 3–12)
Neutro Abs: 2 10*3/uL (ref 1.7–7.7)
Neutrophils Relative %: 29 % — ABNORMAL LOW (ref 43–77)
Platelets: 346 10*3/uL (ref 150–400)
Potassium: 4.5 meq/L (ref 3.5–5.3)
RBC: 4.26 M/uL (ref 4.22–5.81)
RDW: 13.7 % (ref 11.5–15.5)
RPR Ser Ql: REACTIVE — AB
RPR Titer: 1:2 {titer}
Sodium: 141 meq/L (ref 135–145)
T pallidum Antibodies (TP-PA): 29.7 — ABNORMAL HIGH (ref <1.0)
Total Bilirubin: 0.3 mg/dL (ref 0.3–1.2)
Total Protein: 7.5 g/dL (ref 6.0–8.3)
WBC: 6.9 10*3/uL (ref 4.0–10.5)

## 2008-11-26 ENCOUNTER — Ambulatory Visit: Payer: Self-pay | Admitting: Infectious Disease

## 2009-03-01 ENCOUNTER — Ambulatory Visit: Payer: Self-pay | Admitting: Infectious Disease

## 2009-03-01 LAB — CONVERTED CEMR LAB
ALT: 34 units/L (ref 0–53)
AST: 31 units/L (ref 0–37)
Albumin: 4.2 g/dL (ref 3.5–5.2)
Alkaline Phosphatase: 50 units/L (ref 39–117)
BUN: 22 mg/dL (ref 6–23)
Basophils Absolute: 0.1 10*3/uL (ref 0.0–0.1)
Basophils Relative: 1 % (ref 0–1)
CO2: 22 meq/L (ref 19–32)
Calcium: 9.9 mg/dL (ref 8.4–10.5)
Chloride: 105 meq/L (ref 96–112)
Creatinine, Ser: 1.37 mg/dL (ref 0.40–1.50)
Eosinophils Absolute: 0.4 10*3/uL (ref 0.0–0.7)
Eosinophils Relative: 6 % — ABNORMAL HIGH (ref 0–5)
Glucose, Bld: 88 mg/dL (ref 70–99)
HCT: 44.6 % (ref 39.0–52.0)
HIV 1 RNA Quant: 341 copies/mL — ABNORMAL HIGH (ref <48)
HIV-1 RNA Quant, Log: 2.53 — ABNORMAL HIGH (ref <1.68)
Hemoglobin: 14.9 g/dL (ref 13.0–17.0)
Lymphocytes Relative: 59 % — ABNORMAL HIGH (ref 12–46)
Lymphs Abs: 4.6 10*3/uL — ABNORMAL HIGH (ref 0.7–4.0)
MCHC: 33.4 g/dL (ref 30.0–36.0)
MCV: 94.9 fL (ref >=78.0)
Monocytes Absolute: 0.9 10*3/uL (ref 0.1–1.0)
Monocytes Relative: 11 % (ref 3–12)
Neutro Abs: 1.8 10*3/uL (ref 1.7–7.7)
Neutrophils Relative %: 23 % — ABNORMAL LOW (ref 43–77)
Platelets: 365 10*3/uL (ref 150–400)
Potassium: 4.5 meq/L (ref 3.5–5.3)
RBC: 4.7 M/uL (ref 4.22–5.81)
RDW: 14 % (ref 11.5–15.5)
RPR Ser Ql: REACTIVE — AB
RPR Titer: 1:8 {titer}
Sodium: 141 meq/L (ref 135–145)
T pallidum Antibodies (TP-PA): 48.2 — ABNORMAL HIGH (ref <1.0)
Total Bilirubin: 0.3 mg/dL (ref 0.3–1.2)
Total Protein: 7.7 g/dL (ref 6.0–8.3)
WBC: 7.8 10*3/uL (ref 4.0–10.5)

## 2009-03-02 ENCOUNTER — Ambulatory Visit: Payer: Self-pay | Admitting: Infectious Disease

## 2009-03-09 ENCOUNTER — Ambulatory Visit: Payer: Self-pay | Admitting: Infectious Disease

## 2009-03-11 ENCOUNTER — Ambulatory Visit: Payer: Self-pay | Admitting: Infectious Disease

## 2009-03-11 DIAGNOSIS — A523 Neurosyphilis, unspecified: Secondary | ICD-10-CM | POA: Insufficient documentation

## 2009-03-12 ENCOUNTER — Encounter: Payer: Self-pay | Admitting: Infectious Disease

## 2009-03-13 ENCOUNTER — Encounter: Payer: Self-pay | Admitting: Infectious Disease

## 2009-03-16 ENCOUNTER — Ambulatory Visit: Payer: Self-pay | Admitting: Infectious Disease

## 2009-10-08 ENCOUNTER — Encounter: Payer: Self-pay | Admitting: Infectious Disease

## 2009-10-12 ENCOUNTER — Ambulatory Visit: Payer: Self-pay | Admitting: Infectious Disease

## 2009-10-12 LAB — CONVERTED CEMR LAB
ALT: 33 units/L (ref 0–53)
AST: 26 units/L (ref 0–37)
Albumin: 4.4 g/dL (ref 3.5–5.2)
Alkaline Phosphatase: 53 units/L (ref 39–117)
BUN: 12 mg/dL (ref 6–23)
Basophils Absolute: 0.1 10*3/uL (ref 0.0–0.1)
Basophils Relative: 1 % (ref 0–1)
CO2: 25 meq/L (ref 19–32)
Calcium: 9.8 mg/dL (ref 8.4–10.5)
Chloride: 107 meq/L (ref 96–112)
Cholesterol: 237 mg/dL — ABNORMAL HIGH (ref 0–200)
Creatinine, Ser: 1.44 mg/dL (ref 0.40–1.50)
Eosinophils Absolute: 0.4 10*3/uL (ref 0.0–0.7)
Eosinophils Relative: 5 % (ref 0–5)
Glucose, Bld: 76 mg/dL (ref 70–99)
HCT: 40.8 % (ref 39.0–52.0)
HDL: 41 mg/dL (ref >39)
HIV 1 RNA Quant: <20 copies/mL (ref <20)
HIV-1 RNA Quant, Log: <1.3 (ref <1.30)
Hemoglobin: 13.9 g/dL (ref 13.0–17.0)
LDL Cholesterol: 163 mg/dL — ABNORMAL HIGH (ref 0–99)
Lymphocytes Relative: 66 % — ABNORMAL HIGH (ref 12–46)
Lymphs Abs: 5.4 10*3/uL — ABNORMAL HIGH (ref 0.7–4.0)
MCHC: 34.1 g/dL (ref 30.0–36.0)
MCV: 92.5 fL (ref 78.0–100.0)
Monocytes Absolute: 0.8 10*3/uL (ref 0.1–1.0)
Monocytes Relative: 10 % (ref 3–12)
Neutro Abs: 1.6 10*3/uL — ABNORMAL LOW (ref 1.7–7.7)
Neutrophils Relative %: 19 % — ABNORMAL LOW (ref 43–77)
Platelets: 350 10*3/uL (ref 150–400)
Potassium: 4.2 meq/L (ref 3.5–5.3)
RBC: 4.41 M/uL (ref 4.22–5.81)
RDW: 14.6 % (ref 11.5–15.5)
Sodium: 143 meq/L (ref 135–145)
Total Bilirubin: 0.4 mg/dL (ref 0.3–1.2)
Total CHOL/HDL Ratio: 5.8
Total Protein: 7.6 g/dL (ref 6.0–8.3)
Triglycerides: 163 mg/dL — ABNORMAL HIGH (ref <150)
VLDL: 33 mg/dL (ref 0–40)
WBC: 8.3 10*3/uL (ref 4.0–10.5)

## 2009-11-01 ENCOUNTER — Ambulatory Visit: Payer: Self-pay | Admitting: Infectious Disease

## 2009-11-01 DIAGNOSIS — E785 Hyperlipidemia, unspecified: Secondary | ICD-10-CM | POA: Insufficient documentation

## 2009-11-01 LAB — CONVERTED CEMR LAB
Chlamydia, Swab/Urine, PCR: NEGATIVE
Cholesterol, target level: 200 mg/dL
GC Probe Amp, Urine: NEGATIVE
HDL goal, serum: 40 mg/dL
LDL Goal: 100 mg/dL
RPR Ser Ql: REACTIVE — AB
RPR Titer: 1:4 {titer}
T pallidum Antibodies (TP-PA): >8 — ABNORMAL HIGH (ref <0.90)

## 2009-12-14 ENCOUNTER — Encounter (INDEPENDENT_AMBULATORY_CARE_PROVIDER_SITE_OTHER): Payer: Self-pay | Admitting: *Deleted

## 2010-02-20 LAB — CONVERTED CEMR LAB
ALT: 23 units/L (ref 0–53)
ALT: 23 units/L (ref 0–53)
AST: 24 units/L (ref 0–37)
AST: 26 units/L (ref 0–37)
Albumin: 4.3 g/dL (ref 3.5–5.2)
Albumin: 4.4 g/dL (ref 3.5–5.2)
Alkaline Phosphatase: 61 units/L (ref 39–117)
Alkaline Phosphatase: 71 units/L (ref 39–117)
BUN: 16 mg/dL (ref 6–23)
BUN: 18 mg/dL (ref 6–23)
Basophils Absolute: 0 10*3/uL (ref 0.0–0.1)
Basophils Absolute: 0.1 10*3/uL (ref 0.0–0.1)
Basophils Relative: 1 % (ref 0–1)
Basophils Relative: 1 % (ref 0–1)
Bilirubin Urine: NEGATIVE
CD4 % Helper T Cell: 20 %
CD4 Count: 450 microliters
CO2: 21 meq/L (ref 19–32)
CO2: 27 meq/L (ref 19–32)
Calcium: 9.3 mg/dL (ref 8.4–10.5)
Calcium: 9.6 mg/dL (ref 8.4–10.5)
Chloride: 104 meq/L (ref 96–112)
Chloride: 96 meq/L (ref 96–112)
Cholesterol: 168 mg/dL (ref 0–200)
Cholesterol: 198 mg/dL (ref 0–200)
Creatinine, Ser: 1.42 mg/dL (ref 0.40–1.50)
Creatinine, Ser: 1.49 mg/dL (ref 0.40–1.50)
Eosinophils Absolute: 0.1 10*3/uL (ref 0.0–0.7)
Eosinophils Absolute: 0.4 10*3/uL (ref 0.0–0.7)
Eosinophils Relative: 2 % (ref 0–5)
Eosinophils Relative: 3 % (ref 0–5)
Glucose, Bld: 100 mg/dL — ABNORMAL HIGH (ref 70–99)
Glucose, Bld: 89 mg/dL (ref 70–99)
HCT: 39.3 % (ref 39.0–52.0)
HCT: 45.9 % (ref 39.0–52.0)
HDL: 28 mg/dL — ABNORMAL LOW (ref >39)
HDL: 38 mg/dL — ABNORMAL LOW (ref >39)
HIV 1 RNA Quant: 179 copies/mL — ABNORMAL HIGH (ref <50)
HIV 1 RNA Quant: <50 copies/mL (ref <50)
HIV-1 RNA Quant, Log: 2.25 — ABNORMAL HIGH (ref <1.70)
HIV-1 RNA Quant, Log: <1.7 (ref <1.70)
Hemoglobin, Urine: NEGATIVE
Hemoglobin: 13 g/dL (ref 13.0–17.0)
Hemoglobin: 15.3 g/dL (ref 13.0–17.0)
Ketones, ur: NEGATIVE mg/dL
LDL Cholesterol: 109 mg/dL — ABNORMAL HIGH (ref 0–99)
LDL Cholesterol: 77 mg/dL (ref 0–99)
Leukocytes, UA: NEGATIVE
Lymphocytes Relative: 47 % — ABNORMAL HIGH (ref 12–46)
Lymphocytes Relative: 54 % — ABNORMAL HIGH (ref 12–46)
Lymphs Abs: 2.4 10*3/uL (ref 0.7–3.3)
Lymphs Abs: 5.5 10*3/uL — ABNORMAL HIGH (ref 0.7–4.0)
MCHC: 33.1 g/dL (ref 30.0–36.0)
MCHC: 33.3 g/dL (ref 30.0–36.0)
MCV: 98.7 fL (ref 78.0–100.0)
MCV: 98.7 fL (ref 78.0–100.0)
Monocytes Absolute: 0.6 10*3/uL (ref 0.2–0.7)
Monocytes Absolute: 1.1 10*3/uL — ABNORMAL HIGH (ref 0.1–1.0)
Monocytes Relative: 10 % (ref 3–12)
Monocytes Relative: 14 % — ABNORMAL HIGH (ref 3–11)
Neutro Abs: 1.3 10*3/uL — ABNORMAL LOW (ref 1.7–7.7)
Neutro Abs: 4.5 10*3/uL (ref 1.7–7.7)
Neutrophils Relative %: 29 % — ABNORMAL LOW (ref 43–77)
Neutrophils Relative %: 39 % — ABNORMAL LOW (ref 43–77)
Nitrite: NEGATIVE
Platelets: 397 10*3/uL (ref 150–400)
Platelets: 416 10*3/uL — ABNORMAL HIGH (ref 150–400)
Potassium: 4.2 meq/L (ref 3.5–5.3)
Potassium: 4.6 meq/L (ref 3.5–5.3)
Protein, ur: 30 mg/dL — AB
RBC: 3.98 M/uL — ABNORMAL LOW (ref 4.22–5.81)
RBC: 4.65 M/uL (ref 4.22–5.81)
RDW: 13.7 % (ref 11.5–14.0)
RDW: 13.7 % (ref 11.5–15.5)
RPR Ser Ql: REACTIVE — AB
RPR Ser Ql: REACTIVE — AB
RPR Titer: 1:1 {titer}
RPR Titer: 1:8 {titer}
Sodium: 138 meq/L (ref 135–145)
Sodium: 141 meq/L (ref 135–145)
Specific Gravity, Urine: 1.026 (ref 1.005–1.03)
Total Bilirubin: 0.3 mg/dL (ref 0.3–1.2)
Total Bilirubin: 0.7 mg/dL (ref 0.3–1.2)
Total CHOL/HDL Ratio: 5.2
Total CHOL/HDL Ratio: 6
Total Protein: 7.9 g/dL (ref 6.0–8.3)
Total Protein: 8.5 g/dL — ABNORMAL HIGH (ref 6.0–8.3)
Triglycerides: 254 mg/dL — ABNORMAL HIGH (ref <150)
Triglycerides: 313 mg/dL — ABNORMAL HIGH (ref <150)
Urine Glucose: NEGATIVE mg/dL
Urobilinogen, UA: 0.2 (ref 0.0–1.0)
VLDL: 51 mg/dL — ABNORMAL HIGH (ref 0–40)
VLDL: 63 mg/dL — ABNORMAL HIGH (ref 0–40)
WBC: 11.6 10*3/uL — ABNORMAL HIGH (ref 4.0–10.5)
WBC: 4.4 10*3/uL (ref 4.0–10.5)
pH: 6 (ref 5.0–8.0)

## 2010-02-24 NOTE — Miscellaneous (Signed)
Summary: Immunizations  Clinical Lists Changes

## 2010-02-24 NOTE — Assessment & Plan Note (Signed)
Summary: <Adam Becker> F/U/VS   CC:  f/u labs.  History of Present Illness: 55 year old AA male with HIV  very healthy CD4 count but who had persistent low grade viremia in the 100 to 400 range o intensified  regimen to etravirine, ritonavir boosted darunavir and raltegravir. 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 He has had syphiis and titer had come up before and he had had normal WBC on LP, negative VDRL, normal protein but positive CSF FTA-Abs. I had decided then not to treat him for Neurosyphiis but treate him with IM PCN and his RPR had gone down to [facility] but now it is reactive again with titer of 1:8. He has no new neuro ssx other than his crhonic paresthesias in his hands and he has started on IM PCN again. We discussed possibly rx with IV PCN now vs seeeing how titers respond to his 3 IM injections. If they fail to resond or come up again he will need IV PCN x 2 wks. 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 Take 1 Tablet By Mouth Once A Day 2)  Prezista 400 Mg Tabs (Darunavir Ethanolate) .... Take Two Tablets Once Daily With Norvir 3)  Isentress 400 Mg Tabs (Raltegravir Potassium) .... Take 1 Tablet By Mouth Two Times A Day 4)  Norvir 100 Mg  Caps (Ritonavir) .... Take 1 Capsule By Mouth Once A Day With Two Prezista Tablets 5)  Valtrex 1 Gm Tabs (Valacyclovir Hcl) .... Take 1 Tablet By Mouth Two Times A Day For 5 Days 6)  Viagra 50 Mg Tabs (Sildenafil Citrate) .... Take One Half To One Tablet One Hour Prior To Sexual Activity 7)  Intelence 100 Mg Tabs (Etravirine) .... Take Two Tablets Twice Daily 8)  Bicillin L-A 2400000 Unit/32ml Susp (Penicillin G Benzathine) .... 2.18million Units Im in Clinic Once Weekly X 3 Doses  Current Medications (verified): 1)  Lisinopril 20 Mg  Tabs (Lisinopril) .... Take 1 Tablet By Mouth Once A Day 2)  Prezista 400 Mg Tabs (Darunavir Ethanolate) .... Take Two Tablets Once Daily With Norvir 3)  Isentress 400 Mg Tabs (Raltegravir Potassium) .... Take 1 Tablet By Mouth Two Times A Day 4)  Norvir 100 Mg Caps (Ritonavir) .... Take 1 Capsule By Mouth Once A Day With Two Prezista Tablets 5)  Valtrex 1 Gm Tabs (Valacyclovir Hcl) .... Take 1 Tablet By Mouth Two Times A Day For 5 Days 6)  Viagra 50 Mg Tabs (Sildenafil Citrate) .... Take One Half To One Tablet One Hour Prior To Sexual Activity 7)  Intelence 100 Mg Tabs (Etravirine) .... Take Two Tablets Twice Daily 8)  Bicillin L-A 2400000 Unit/42ml Susp (Penicillin G Benzathine) .... 2.40million Units Im in Clinic  Once Weekly X 3 Doses  Allergies (verified): No Known Drug Allergies   Preventive Screening-Counseling & Management  Alcohol-Tobacco     Alcohol drinks/day: 0     Alcohol type: wine     Smoking Status: never     Passive Smoke Exposure: no  Caffeine-Diet-Exercise     Caffeine use/day: 2     Does Patient Exercise: yes     Type of exercise: workout at gym     Times/week: 4  Hep-HIV-STD-Contraception     HIV Risk: no  Safety-Violence-Falls     Seat Belt Use: 100      Sexual History:  currently monogamous.        Drug Use:  never.        Blood Transfusions:  no.     Current Allergies (reviewed today): No known allergies  Vital  Signs:  Patient profile:   55 year old male Height:      73 inches (185.42 cm) Weight:      225 pounds (102.27 kg) BMI:     29.79 Temp:     97.3 degrees F (36.28 degrees C) oral Pulse rate:   80 / minute BP sitting:   139 / 84  (left arm)  Vitals Entered By: Starleen Arms CMA (March 11, 2009 2:31 PM) CC: f/u labs Is Patient Diabetic? No Pain Assessment Patient in pain? no      Nutritional Status BMI of 25 - 29 = overweight Nutritional Status Detail nl  Does patient need assistance? Functional Status Self care Ambulation Normal   Physical Exam  General:  alert.  well-developed, well-nourished, and well-hydrated.   Head:  normocephalic.  atraumatic and no abnormalities observed.   Eyes:  vision grossly intact, pupils equal, pupils round, and pupils reactive to light.   Ears:  no external deformities.   Nose:  no external deformity.   Mouth:  good dentition.  pharynx pink and moist, no erythema, and no exudates.   Neck:  supple and full ROM.   Lungs:  normal respiratory effort, no accessory muscle use, no crackles, and no wheezes.   Heart:  normal rate, regular rhythm, no murmur, no gallop, and no rub.   Abdomen:  soft, non-tender, normal bowel sounds, and no distention.   Msk:  normal ROM.  no joint deformities.   Extremities:  No clubbing, cyanosis, edema, or deformity noted with normal full range of motion of all joints.   Neurologic:  alert & oriented X3,strength normal in all extremitiesgait normal.   Skin:  no rashes.  no suspicious lesions.   Psych:  Oriented X3, memory intact for recent and remote, normally interactive, and good eye contact.          Medication Adherence: 03/11/2009   Adherence to medications reviewed with patient. Counseling to provide adequate adherence provided   Prevention For Positives: 03/11/2009   Safe sex practices discussed with patient. Condoms offered.   Education Materials Provided: 03/11/2009 Safe sex practices discussed with  patient. Condoms offered.                          Impression & Recommendations:  Problem # 1:  HIV INFECTION (ICD-042) STable Viral load, CD4 count very healthy His updated medication list for this problem includes:    Valtrex 1 Gm Tabs (Valacyclovir hcl) .Marland Kitchen... Take 1 tablet by mouth two times a day for 5 days    Bicillin L-a 2400000 Unit/51ml Susp (Penicillin g benzathine) .Marland KitchenMarland KitchenMarland KitchenMarland Kitchen  2.29million units im in clinic once weekly x 3 doses  Orders: Est. Patient Level IV (99214)Future Orders: T-CD4SP ([facility]) (CD4SP) ... 06/14/2009 T-HIV Viral Load [phone]) ... 06/14/2009 T-CBC w/Diff ([id number]) ... 06/14/2009 T-Comprehensive Metabolic Panel [phone]) ... 06/14/2009 T-RPR (Syphilis) [phone]) ... 06/14/2009  Problem # 2:  LATE SYPHILIS, LATENT (ICD-096) See HPi. We will retreat him with 3 IM doses of PCN but if his RPR comes up again he is getting 2 weeks of IV PCN followed by one dose of IM PCN. He is agreeable to this approach. No point in repeating the spinal tap at this point. Orders: Est. Patient Level IV (28413)  Problem # 3:  NEUROSYPHILIS (ICD-094.9) see above discussion. He does not cleraly have neurosyphilis. He id have CSF FTA aB POSITIVE BUT wbc normal, protein nml and VDRL negative. BUt with his RPR having gone up for second time it is high on my radar. If it goes up again he gets rx for NEurosyphilis Orders: Est. Patient Level IV (24401)  Problem # 4:  PARESTHESIA, HANDS (ICD-782.0) Not clearly related to his syphliis, stable. More likely due to his HIV>  Problem # 5:  HYPERTENSION, BENIGN ESSENTIAL (ICD-401.1) Recheck BP next visit, May need to increase his ACEI His updated medication list for this problem includes    Lisinopril 20 Mg Tabs (Lisinopril) .Marland Kitchen... Take 1 tablet by mouth once a day  Orders: Est. Patient Level IV (99214)  BP today: 139/84 Prior BP: 115/75 (11/26/2008)  Prior 10 Yr Risk Heart Disease: 9 % (06/18/2008)  Labs  Reviewed: K+: 4.5 (03/01/2009) Creat: : 1.37 (03/01/2009)   Chol: 185 (09/08/2008)   HDL: 39 (09/08/2008)   LDL: 129 (09/08/2008)   TG: 83 (09/08/2008)  Other Orders: Future Orders: T-Lipid Profile ([id number]) ... 06/14/2009  Patient Instructions: 1)  rtc to see Dr. Zenaida Niece dam in 4 months   Process Orders Check Orders Results:     [facility]: ABN not required for this insurance Tests Sent for requisitioning (March 12, 2009 7:11 PM):     06/14/2009: [facility] -- T-HIV Viral Load [phone] (signed)     06/14/2009: [facility] -- T-CBC w/Diff [[id number]] (signed)     06/14/2009: [facility] -- T-Comprehensive Metabolic Panel [[id number]] (signed)     06/14/2009: [facility] -- T-Lipid Profile [phone] (signed)     06/14/2009: [facility] -- T-RPR (Syphilis) [phone] (signed)   Prevention & Chronic Care Immunizations   Influenza vaccine: Fluvax 3+  (11/26/2008)    Tetanus booster: Not documented    Pneumococcal vaccine: Not documented  Colorectal Screening   Hemoccult: Not documented    Colonoscopy: Location:  [facility].    (10/13/2008)   Colonoscopy due: 10/2013  Other Screening   PSA: Not documented   Smoking status: never  (03/11/2009)  Lipids   Total Cholesterol: 185  (09/08/2008)   LDL: 129  (09/08/2008)   LDL Direct: Not documented   HDL: 39  (09/08/2008)   Triglycerides: 83  (09/08/2008)  Hypertension   Last Blood Pressure: 139 / 84  (03/11/2009)   Serum creatinine: 1.37  (03/01/2009)   Serum potassium 4.5  (03/01/2009) CMP ordered   Self-Management Support :    Hypertension self-management support: Not documented         Medication Adherence: 03/11/2009   Adherence to medications reviewed with patient. Counseling to provide adequate adherence provided    Prevention For Positives: 03/11/2009   Safe sex practices  discussed with patient.  Condoms offered.

## 2010-02-24 NOTE — Progress Notes (Signed)
Summary: Pt request for viagra  Phone Note Call from Patient   Caller: Patient Reason for Call: Talk to Doctor Details for Reason: Patient requested viagra Summary of Call: I will give Mr. Klonowski viagra, provided he practices safe sex and uses condoms with all forms of sexual intercourse. I will give him four pill prescription. Initial call taken by: Acey Lav MD,  March 14, 2008 3:39 PM  Follow-up for Phone Call        Phone Call Completed, Rx Called In    New/Updated Medications: VIAGRA 50 MG TABS (SILDENAFIL CITRATE) take one half to one tablet one hour prior to sexual activity   Prescriptions: VIAGRA 50 MG TABS (SILDENAFIL CITRATE) take one half to one tablet one hour prior to sexual activity  #4 x 3   Entered and Authorized by:   Acey Lav MD   Signed by:   Paulette Blanch Dam MD on 03/14/2008   Method used:   [facility] to        [facility] [phone][facility] (retail)       [address]       [place], [place]  [address]       Ph: [phone]       Fax: [phone]   RxID:   [id number]

## 2010-02-24 NOTE — Assessment & Plan Note (Signed)
Summary: reassigned 042   Chief Complaint:  f/u ov.  History of Present Illness: 55 year old [place] American male on [place] with most recent Cd4 count of  79, hiv viral load of 654 earlier this month. He claims to have been nearly 100% adherent to this regimen. He has not had any GI illness that would have interfered with absorption of his medications per his account. His only other medication is HCTZ.  He is currently single living by himself. He does not use tobacco and rarely drinks alcohol. He is not currently employed. He has been sexually active with two other men during the past two months. They were aware of his HIV status and they used condoms with both receptive and insertive intercourse. The patient otherwise is without complaints today.     Family History:    No hx of early CAD  Social History:    Never Smoked    Alcohol use-yes    Drug use-no    Regular exercise-yes   Risk Factors:  Tobacco use:  never Passive smoke exposure:  no Drug use:  no HIV high-risk behavior:  no Caffeine use:  2 drinks per day Alcohol use:  yes    Type:  wine    Drinks per day:  <1 Exercise:  yes    Times per week:  4    Type:  workout at gym Seatbelt use:  100 %  Family History Risk Factors:    Family History of MI in females < 74 years old:  no    Family History of MI in males < 49 years old:  no   Review of Systems  The patient denies anorexia, fever, weight loss, hoarseness, chest pain, dyspnea on exhertion, peripheral edema, melena, hematochezia, severe indigestion/heartburn, hematuria, muscle weakness, and depression.     Vital Signs:  Patient Profile:   55 Years Old Male Height:     73 inches Weight:      205.3 pounds BMI:     27.18 Temp:     98.6 degrees F oral Pulse rate:   69 / minute BP sitting:   129 / 88  (right arm)  Pt. in pain?   no  Vitals Entered By: Tomasita Morrow RN (October 15, 2006 10:20 AM)              Is Patient Diabetic? No  Have you ever been in a relationship where you felt threatened, hurt or afraid?No  Domestic Violence Intervention none  Does patient need assistance? Functional Status Self care Ambulation Normal     Physical Exam  General:     alert and well-developed.   Head:     normocephalic and atraumatic.   Eyes:     vision grossly intact.   Ears:     no external deformities.   Nose:     no external deformity.   Mouth:     no lesions and no aphthous ulcers.   Neck:     supple.   Chest Wall:     no deformities.   Breasts:     no gynecomastia.   Lungs:     normal respiratory effort, no accessory muscle use, normal breath sounds, no crackles, and no wheezes.   Heart:     normal rate, regular rhythm, no murmur, no gallop, and no rub.   Abdomen:     soft, non-tender, normal bowel sounds, no distention, no masses, and no rebound tenderness.   Extremities:  No clubbing, cyanosis, edema, or deformity noted with normal full range of motion of all joints.   Neurologic:     alert & oriented X3.   Skin:     no rashes.   Psych:     Oriented X3.      Impression & Recommendations:  Problem # 1:  HIV INFECTION (ICD-042) I am concerned by his rising viral load. I suspect that despite what he says that he has had a period of noncompliance that caused a rise in his viral load that we are now seeing. I am nonetheless checking a viral load today and will get a genotype if it is sufficienty elevated in case he is developing resistance. I will also check an HLAB 701 allele to make sure that I can in the future use abacavir with him. The reason for chaning to an abacavir based regmen is his history of borderline renal function which would make tenofovir less desirable. For now I am going to contnue him on kaletra two tabs two times a day.  His updated medication list for this problem includes:    Truvada 200-300 Mg Tabs (Emtricitabine-tenofovir) ..... Once daily    Kaletra 200-50 Mg Tabs  (Lopinavir-ritonavir) .Marland Kitchen..Marland Kitchen Two twice a day  Orders: T-HIV1 Quant rflx Ultra or Genotype ([id number]) T- * Misc. Laboratory test [phone]) T-Urine Microalbumin w/creat. ratio (01027 / [id number]) Consultation Level IV [id number])  Future Orders: T-Comprehensive Metabolic Panel ([id number]) ... 11/26/2006 T-Lipid Profile [id number]) ... 11/26/2006 T-CBC w/Diff ([id number]) ... 11/26/2006 T-CD4 ([id number]) ... 11/26/2006 T-HIV Viral Load [id number]) ... 11/26/2006   Problem # 2:  HYPERTENSION, BENIGN ESSENTIAL (ICD-401.1) I am checking a microalbumin to creatinine ratio today. If he has evidence of microalbunuria I am going to start him on an ACEI (I may do this is any case and if needed titrate downt the HCTZ while upping the ACEI to maximum dose. His updated medication list for this problem include    Hydrochlorothiazide 25 Mg Tabs (Hydrochlorothiazide) ..... Marland Kitchenqd  Orders: T-Urine Microalbumin w/creat. ratio (76283 / [id number]) Consultation Level IV (37106)   Problem # 3:  IMPAIRED RENAL FUNCTION NOS (ICD-588.9) checking microalbumin to cratinine as above and will add in acei  Problem # 4:  PREVENTIVE HEALTH CARE (ICD-V70.0) Will recheck fasting lipid at next visit. May start him on fish oil to try to bring up his HDL which is low. His trigyclerides were also elevated at last check  Problem # 5:  VACCINE AGAINST INFLUENZA (ICD-V04.81) Assessment: New  Orders: Flu Vaccine 100yrs + (26948) Admin 1st Vaccine (54627)    Patient Instructions: 1)  Please schedule a follow-up appointment in 2 months.    ]  Influenza Vaccine    Vaccine Type: Fluvax 3+    Site: right deltoid    Mfr: Novartis    Dose: 0.5 ml    Route: IM    Given by: Tomasita Morrow RN    Exp. Date: 07/23/2007    Lot #: [id number]    VIS given: 07/22/04 version given October 15, 2006.  Flu Vaccine Consent Questions    Do you have a history of severe allergic reactions to this vaccine? no    Any prior  history of allergic reactions to egg and/or gelatin? no    Do you have a sensitivity to the preservative Thimersol? no    Do you have a past history of Guillan-Barre Syndrome? no    Do you currently have an acute febrile illness? no    Have you  ever had a severe reaction to latex? no    Vaccine information given and explained to patient? yes

## 2010-02-24 NOTE — Miscellaneous (Signed)
Summary: Orders Update - labs  Clinical Lists Changes  Problems: Added new problem of ENCOUNTER FOR LONG-TERM USE OF OTHER MEDICATIONS (ICD-V58.69) Orders: Added new Test order of T-Lipid Profile [phone]) - Signed Added new Test order of T-CBC w/Diff [phone]) - Signed Added new Test order of T-CD4SP [facility]) (CD4SP) - Signed Added new Test order of T-Comprehensive Metabolic Panel [phone]) - Signed Added new Test order of T-HIV Viral Load [phone]) - Signed     Process Orders Check Orders Results:     [facility]: ABN not required for this insurance Order queued for requisitioning for [facility]: October 08, 2009 3:07 PM  Tests Sent for requisitioning (October 08, 2009 3:07 PM):     10/12/2009: [facility] -- T-Lipid Profile [phone] (signed)     10/12/2009: [facility] -- T-CBC w/Diff [[id number]] (signed)     10/12/2009: [facility] -- T-Comprehensive Metabolic Panel [[id number]] (signed)     10/12/2009: [facility] -- T-HIV Viral Load [phone] (signed)

## 2010-02-24 NOTE — Miscellaneous (Signed)
Summary: previsit  Clinical Lists Changes  Medications: Added new medication of MOVIPREP 100 GM  SOLR (PEG-KCL-NACL-NASULF-NA ASC-C) As directed - Signed Rx of MOVIPREP 100 GM  SOLR (PEG-KCL-NACL-NASULF-NA ASC-C) As directed;  #1 x 0;  Signed;  Entered by: Clide Cliff RN;  Authorized by: Hart Carwin MD;  Method used: [id number] [address] [place], [place], [place]  [address], Ph: [phone], Fax: [phone]    Prescriptions: MOVIPREP 100 GM  SOLR (PEG-KCL-NACL-NASULF-NA ASC-C) As directed  #1 x 0   Entered by:   Clide Cliff RN   Authorized by:   Hart Carwin MD   Signed by:   Clide Cliff RN on 09/29/2008   Method used:   Electronically to        [facility] [phone]* (retail)       [address]       [place], [place]  [address]       Ph: [phone]       Fax: [phone]   RxID:   [id number]

## 2010-02-24 NOTE — Procedures (Signed)
Summary: Colonoscopy   Colonoscopy  Procedure date:  10/13/2008  Findings:      Location:  [facility].    Procedures Next Due Date:    Colonoscopy: 10/2013 COLONOSCOPY PROCEDURE REPORT  PATIENT:  Adam, Becker  MR#:  [id number] BIRTHDATE:   [birth year], 53 yrs. old   GENDER:   male  ENDOSCOPIST:   Hedwig Morton. Juanda Chance, MD Referred by: Leodis Liverpool, M.D.  PROCEDURE DATE:  10/13/2008 PROCEDURE:  Higher-risk screening colonoscopy G0105   ASA CLASS:   Class I INDICATIONS: Elevated Risk Screening   MEDICATIONS:    Versed 7 mg, Fentanyl 75 mcg  DESCRIPTION OF PROCEDURE:   After the risks benefits and alternatives of the procedure were thoroughly explained, informed consent was obtained.  Digital rectal exam was performed and revealed no rectal masses.   The LB [id number] endoscope was introduced through the anus and advanced to the cecum, which was identified by both the appendix and ileocecal valve, without limitations.  The quality of the prep was good, using MoviPrep.  The instrument was then slowly withdrawn as the colon was fully examined. <<PROCEDUREIMAGES>>          <<OLD IMAGES>>  FINDINGS:  No polyps or cancers were seen (see image1, image2, image3, image4, and image5).  This was otherwise a normal examination of the colon.   Retroflexed views in the rectum revealed no abnormalities.    The scope was then withdrawn from the patient and the procedure completed.  COMPLICATIONS:   None  ENDOSCOPIC IMPRESSION:  1) No polyps or cancers  2) Otherwise normal examination RECOMMENDATIONS:  1) high fiber diet  REPEAT EXAM:   In 5 year(s) for.   _______________________________ Hedwig Morton. Juanda Chance, MD  CC:

## 2010-02-24 NOTE — Progress Notes (Signed)
Summary: Approved for NCADA{  Patient was approved for NCADAP. [facility] to be [facility] to patient for Wednesday. ...................................................................Paulo Fruit  May 13, 2007 9:26 AM

## 2010-02-24 NOTE — Miscellaneous (Signed)
Summary: RW Update  Clinical Lists Changes  Observations: Added new observation of YEARAIDSPOS: 1993  (03/13/2009 10:16)

## 2010-02-24 NOTE — Assessment & Plan Note (Signed)
Summary: FU OV/VS   History of Present Illness: 55 year old AA male with HIV on [place] and truvada  who had developed Resistance (see genotype) with now intermediate S to tenofovir, 184 V with R to emtricitabine but increased sensitivty conferred from this mutation to TNF, no R  to [place]. ON repeat check of his  labs his cd4 count is 670 and his viral load is undetectable.  withHis main complaint today was "losing his nature" i.e. erectile dysfunction. He is able to have an erection but is not able to sustain it very long. This seems to have begun as a problem once he started HCTZ. He claims that he uses condoms when he has any form of sexual intercourse. He denies depression. The remainder of his ROS is negative.    Past Medical History:    HIV with R profile noted in emr    ERectile dysfunction    HTN   Family History:    Reviewed history from 10/15/2006 and no changes required:       No hx of early CAD  Social History:    Reviewed history from 10/15/2006 and no changes required:       Never Smoked       Alcohol use-yes       Drug use-no       Regular exercise-yes   Risk Factors: Tobacco use:  never Passive smoke exposure:  no Drug use:  no HIV high-risk behavior:  no Caffeine use:  2 drinks per day Alcohol use:  yes    Type:  wine    Drinks per day:  <1 Exercise:  yes    Times per week:  4    Type:  workout at gym Seatbelt use:  100 %  Family History Risk Factors:    Family History of MI in females < 53 years old:  no    Family History of MI in males < 42 years old:  no    Vital Signs:  Patient Profile:   55 Years Old Male Height:     73 inches (185.42 cm) Weight:      207 pounds (94.09 kg) BMI:     27.41 Temp:     98.3 degrees F (36.83 degrees C) oral Pulse rate:   79 / minute BP sitting:   141 / 88  (left arm)  Pt. in pain?   no  Vitals Entered By: Starleen Arms (December 31, 2006 9:29 AM)              Is Patient Diabetic? No Nutritional  Status BMI of 25 - 29 = overweight  Does patient need assistance? Functional Status Self care Ambulation Normal     Physical Exam  General:     alert.   Head:     normocephalic.   Eyes:     vision grossly intact, pupils equal, pupils round, and pupils reactive to light.   Ears:     no external deformities.   Nose:     no external deformity.   Mouth:     good dentition.   Chest Wall:     no deformities.   Lungs:     normal respiratory effort, no crackles, and no wheezes.   Heart:     normal rate, regular rhythm, no murmur, no gallop, and no rub.   Abdomen:     soft, non-tender, normal bowel sounds, and no distention.   Genitalia:     uncircumcised, no varicocele,  and no scrotal masses.   Extremities:     No clubbing, cyanosis, edema, or deformity noted with normal full range of motion of all joints.   Neurologic:     alert & oriented X3.   Skin:     turgor normal and no rashes.   Psych:     Oriented X3.      Impression & Recommendations:  Problem # 1:  HIV INFECTION (ICD-042) I have reviewed his case with both Orlene Erm and Hetty Ely from [place]. They had suggested plan of either continuing patient on current regimen vs enrolling him in OPTIONS trial at [facility] vs placing him on salvage regimen, with darunavir boosted with ritonavir, raltegravir and etravirine. Have discussed options with patient and we are encouraged by his undetectagble viral load for now so willcontinue with current ARV for now. His updated medication list for this problem includes:    Truvada 200-300 Mg Tabs (Emtricitabine-tenofovir) ..... Once daily    Kaletra 200-50 Mg Tabs (Lopinavir-ritonavir) .Marland Kitchen..Marland Kitchen Two twice a day  Orders: Est. Patient Level IV (16109)  Future Orders: T-Comprehensive Metabolic Panel ([id number]) ... 03/25/2007 T-CBC w/Diff ([id number]) ... 03/25/2007 T-CD4 ([id number]) ... 03/25/2007 T-HIV Viral Load [id number]) ... 03/25/2007   Problem # 2:  HYPERTENSION, BENIGN  ESSENTIAL (ICD-401.1) While I am unfamilar with ED as SE of hctz there is si some mention of it in the literature. Therefore I will switch him to ACEI which I will titrate up with lab draw and repeat bmp next week. The following medications were removed from the medication list:    Hydrochlorothiazide 25 Mg Tabs (Hydrochlorothiazide) ..... Marland Kitchenqd  His updated medication list for this problem includes:    Lisinopril 20 Mg Tabs (Lisinopril) .Marland Kitchen... Take 1 tablet by mouth once a day  Orders: Est. Patient Level IV (24401)  Future Orders: T-Basic Metabolic Panel [id number]) ... 01/04/2007   Problem # 3:  ERECTILE DYSFUNCTION, MILD (ICD-302.72) POssibly due to hctz, will change to ACEI. Check testosterone at next visit though no dec muscle bulk or dec shaving frequency. He does not wish for urology referral Orders: Est. Patient Level IV (03474)  Future Orders: T-Testosterone; Total [phone]) ... 03/25/2007   Problem # 4:  PREVENTIVE HEALTH CARE (ICD-V70.0) check fasting lipids next visit  gave hep a vaccine today  Medications Added to Medication List This Visit: 1)  Lisinopril 20 Mg Tabs (Lisinopril) .... Take 1 tablet by mouth once a day  Other Orders: Hepatitis A Vaccine (Adult Dose) (43329) Admin 1st Vaccine (51884)   History of Present Illness: 55 year old AA male with HIV on [place] and [place]  who had developed Resistance (see genotype) with now intermediate S to tenofovir, 184 V with R to emtricitabine but increased sensitivty conferred from this mutation to TNF, no R  to [place]. ON repeat check of his  labs his cd4 count is 670 and his viral load is undetectable.  withHis main complaint today was "losing his nature" i.e. erectile dysfunction. He is able to have an erection but is not able to sustain it very long. This seems to have begun as a problem once he started HCTZ. He claims that he uses condoms when he has any form of sexual intercourse. He denies depression. The  remainder of his ROS is negative.   Patient Instructions: 1)  Please schedule a follow-up appointment in 3 months. 2)  Please comein fasting for your next visit.    Prescriptions: LISINOPRIL 20 MG  TABS (LISINOPRIL) Take 1 tablet  by mouth once a day  #31 x 12   Entered and Authorized by:   Acey Lav MD   Signed by:   Paulette Blanch Dam MD on 12/31/2006   Method used:   Print then Give to Patient   RxID:   [id number]  ]    Hepatitis A Vaccine # 1    Vaccine Type: HepA    Site: right deltoid    Mfr: havrix    Dose: 0.5 ml    Route: IM    Given by: Starleen Arms    Exp. Date: [id number]    Lot #: [id number]    VIS given: 04/12/04 version given December 31, 2006.

## 2010-02-24 NOTE — Miscellaneous (Signed)
Summary: RW - AIDS Year  Clinical Lists Changes  Observations: Added new observation of YEARAIDSPOS: 1993  (09/06/2007 14:55) Added new observation of HIV STATUS: CDC-defined AIDS  (09/06/2007 14:55)

## 2010-02-24 NOTE — Assessment & Plan Note (Signed)
Summary: 2:15 resfrom 12/16kam   Chief Complaint:  f/u.  History of Present Illness: 55 year old AA male with HIV  cd4 910, vl 125 on new regimen of truvada, raltegravir, ritonavir boosted darunavir. He claims to not be missing doses. His appetite is better. I was wanting to also swap in etravirine but this was to complicated regimen for his liking. 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 He has been retreated for syphilis as well in interim. He claims to only have sex with one partner, claiming that he is using condoms with all forms of sexual intercourse. Condoms were offered condoms but did not want them today.  He denies depression. He has no other complaints today. Flu Vaccine Consent Questions     Do you have a history of severe allergic reactions to this vaccine? no    Any prior history of allergic reactions to egg and/or gelatin? no    Do you have a sensitivity to the preservative Thimersol? no    Do you have a past history of Guillan-Barre Syndrome? no    Do you currently have an acute febrile illness? no    Have you ever had a severe reaction to latex? no    Vaccine information given and explained to patient? yes    Are you currently pregnant? no   Do you have Asthma? no   Lot Number: [id number]   Exp Date:05/19/2009   Site Given left Deltoid Starleen Arms CMA  January 02, 2008 2:35 PM    Updated Prior Medication List: TRUVADA 200-300 MG TABS (EMTRICITABINE-TENOFOVIR) once daily LISINOPRIL 20 MG  TABS (LISINOPRIL) Take 1 tablet by mouth once a day BICILLIN L-A 2400000 UNIT/4ML SUSP (PENICILLIN G BENZATHINE) 2.4 million UNITS IM once weekly for three doses PREZISTA 400 MG TABS (DARUNAVIR ETHANOLATE) take two  tablets once daily with norvir ISENTRESS 400 MG TABS (RALTEGRAVIR POTASSIUM) Take 1 tablet by mouth two times a day NORVIR 100 MG CAPS (RITONAVIR) Take 1 capsule by mouth once a day with two prezista tablets VALTREX 1 GM TABS (VALACYCLOVIR HCL) Take 1 tablet by mouth two times a day for 5 days  Current Allergies: No known allergies     Risk Factors: Tobacco use:  never Passive smoke exposure:  no Drug use:  no HIV high-risk behavior:  no Caffeine use:  2 drinks per day Alcohol use:  yes    Type:  wine    Drinks per day:  <1 Exercise:  yes    Times per week:  4    Type:  workout at gym Seatbelt use:  100 %  Family History Risk Factors:    Family History of MI in females < 82 years old:  no    Family History of MI in males < 44 years old:  no   Review of Systems  The patient denies anorexia, fever, weight loss, weight gain, vision loss, decreased hearing, hoarseness, chest pain, syncope, dyspnea on exertion, peripheral edema, prolonged cough, headaches, hemoptysis, abdominal pain, melena, hematochezia, severe indigestion/heartburn, hematuria, incontinence, genital sores, muscle weakness, suspicious skin lesions, transient blindness, difficulty walking, depression, unusual weight change, abnormal bleeding, and angioedema.     Vital Signs:  Patient Profile:   54 Years Old Male Height:     73 inches (185.42 cm) Weight:      211.50 pounds (96.14 kg) BMI:     28.00 Temp:     98.4 degrees F (36.89 degrees C) oral Pulse rate:   74 / minute BP sitting:   126 / 80  (left arm)  Pt. in pain?   no  Vitals Entered By: Starleen Arms CMA (January 02, 2008 2:04 PM)              Is Patient Diabetic? No Nutritional Status BMI of 25 - 29 = overweight Nutritional Status Detail nl  Have you ever been in a relationship where you felt threatened, hurt or afraid?No   Does patient need assistance? Functional Status Self care Ambulation Normal     Physical Exam  General:      alert.   Head:     normocephalic.   Eyes:     vision grossly intact, pupils equal, pupils round, and pupils reactive to light.   Ears:     no external deformities.   Nose:     no external deformity.   Mouth:     good dentition.   Chest Wall:     no deformities.   Breasts:     no gynecomastia.   Lungs:     normal respiratory effort, no crackles, and no wheezes.   Heart:     normal rate, regular rhythm, no murmur, no gallop, and no rub.   Abdomen:     soft, non-tender, normal bowel sounds, and no distention.   Neurologic:     alert & oriented X3, cranial nerves II-XII intact, strength normal in all extremities    Impression & Recommendations:  Problem # 1:  HIV INFECTION (ICD-042)  His updated medication list for this problem includes:    Truvada 200-300 Mg Tabs (Emtricitabine-tenofovir) ..... Once daily    Bicillin L-a 2400000 Unit/7ml Susp (Penicillin g benzathine) .Marland Kitchen... 2.4 million units im once weekly for three doses    Prezista 400 Mg Tabs (Darunavir ethanolate) .Marland Kitchen... Take two tablets once daily with norvir    Isentress 400 Mg Tabs (Raltegravir potassium) .Marland Kitchen... Take 1 tablet by mouth two times a day    Norvir 100 Mg Caps (Ritonavir) .Marland Kitchen... Take 1 capsule by mouth once a day with two prezista tablets    Valtrex 1 Gm Tabs (Valacyclovir hcl) .Marland Kitchen... Take 1 tablet by mouth two times a day for 5 days  Orders: Est. Patient Level IV (56213)  Future Orders: T-HIV Viral Load ([id number]) ... 04/01/2008 T-CD4 ([id number]) ... 04/01/2008 T-CBC w/Diff ([id number]) ... 04/01/2008 T-Lipid Profile [id number]) ... 04/01/2008 T-Comprehensive Metabolic Panel [id number]) ... 04/01/2008   Problem # 2:  HYPERTENSION, BENIGN ESSENTIAL (ICD-401.1) Assessment: Improved  His updated medication list for this problem includes:    Lisinopril 20 Mg Tabs (Lisinopril) .Marland Kitchen... Take 1 tablet by mouth once a day  Orders: Est. Patient Level IV (32951)   Problem # 3:  PREVENTIVE HEALTH  CARE (ICD-V70.0) H1n1 and fluvax, hep a vax. Will discuss colonoscopy.  Other Orders: H1N1 vaccine (O8416) Influenza A (H1N1) w/ Phy couseling (S0630)     Patient Instructions: 1)  Please schedule a follow-up appointment in 4 months. 2)  fAST FOR LABS   ]

## 2010-02-24 NOTE — Miscellaneous (Signed)
Summary: Lab Rpt: CD4  Clinical Lists Changes  Observations: Added new observation of CD4 COUNT: 450 microliters (04/02/2006 16:12) Added new observation of CD4 %: 20 % (04/02/2006 16:12)

## 2010-02-24 NOTE — Progress Notes (Signed)
Summary: Prep change  ---- Converted from flag ---- ---- 09/30/2008 6:36 PM, Hart Carwin MD wrote: Miralax prep, please  ---- 09/30/2008 1:50 PM, Wyona Almas RN wrote: Dr. Juanda Chance, Mr. Davoli is requesting a less expensive prep for his colonoscopy on 9/21 at 1:30PM.  Which prep would you like me to order for him.  Originally Movi prep was ordered.  Thank you, Alvino Chapel ------------------------------

## 2010-02-24 NOTE — Miscellaneous (Signed)
Summary: RW  Clinical Lists Changes  Observations: Added new observation of ABNLIPIDS: No (04/02/2006 17:26) Added new observation of LASTLIPIDPRO: 04/02/2006 (04/02/2006 17:26) Added new observation of SYPHL_POS: Yes (04/02/2006 17:26) Added new observation of SYPHLSCREEN: 04/02/2006 (04/02/2006 17:26)

## 2010-02-24 NOTE — Assessment & Plan Note (Signed)
Summary: EST-CK/CFB   Chief Complaint:  f/u.  History of Present Illness: 55 year old AA male with HIV on [place]  who had developed Resistance (see genotype) with now intermediate S to tenofovir, 184 V with R to emtricitabine but increased sensitivty conferred from this mutation to TNF, no R  to [place]. He was with undetectable viral load last visit, this month 127. HIs cd4 stil healthy at near 700. He states that he will occasionally miss an evening do. se of kaletra. He is sexually active with his HIV positive partner with receptive and insertive anal intercourse. He states taht he uses condoms 100% of thetime.Marland Kitchen He denies depression. The remainder of his ROS is negative.    Current Allergies: No known allergies   Past Medical History:    Reviewed history from 12/31/2006 and no changes required:       HIV with R profile noted in emr       ERectile dysfunction       HTN   Family History:    Reviewed history from 10/15/2006 and no changes required:       No hx of early CAD  Social History:    Never Smoked    Alcohol use-yes    Drug use-no    Regular exercise-yes    Occupation: on disability   Risk Factors: Tobacco use:  never Passive smoke exposure:  no Drug use:  no HIV high-risk behavior:  no Caffeine use:  2 drinks per day Alcohol use:  yes    Type:  wine    Drinks per day:  <1 Exercise:  yes    Times per week:  4    Type:  workout at gym Seatbelt use:  100 %  Family History Risk Factors:    Family History of MI in females < 48 years old:  no    Family History of MI in males < 22 years old:  no   Review of Systems  The patient denies anorexia, fever, weight loss, weight gain, vision loss, decreased hearing, chest pain, syncope, dyspnea on exertion, peripheral edema, prolonged cough, headaches, hemoptysis, abdominal pain, melena, hematochezia, and depression.     Vital Signs:  Patient Profile:   55 Years Old Male Height:     73 inches (185.42  cm) Weight:      204.06 pounds (92.75 kg) BMI:     27.02 Temp:     97.2 degrees F (36.22 degrees C) oral Pulse rate:   54 / minute BP sitting:   123 / 81  (left arm)  Pt. in pain?   no  Vitals Entered By: Starleen Arms CMA (July 15, 2007 9:07 AM)              Is Patient Diabetic? No Nutritional Status BMI of 25 - 29 = overweight  Does patient need assistance? Functional Status Self care Ambulation Normal     Physical Exam  General:     alert.   Head:     normocephalic.   Eyes:     vision grossly intact, pupils equal, pupils round, and pupils reactive to light.   Nose:     no external deformity.   Lungs:     normal respiratory effort, no crackles, and no wheezes.   Heart:     normal rate, regular rhythm, no murmur, no gallop, and no rub.   Abdomen:     soft, non-tender, normal bowel sounds, and no distention.   Extremities:  No clubbing, cyanosis, edema, or deformity noted with normal full range of motion of all joints.      Impression & Recommendations:  Problem # 1:  HIV INFECTION (ICD-042)  His updated medication list for this problem includes:    Truvada 200-300 Mg Tabs (Emtricitabine-tenofovir) ..... Once daily    Kaletra 200-50 Mg Tabs (Lopinavir-ritonavir) .Marland Kitchen..Marland Kitchen Two twice a day  Orders: T-RPR (Syphilis) ([id number]) T-GC Probe, urine ([id number]) Est. Patient Level IV (30865)  Future Orders: T-HIV Viral Load ([id number]) ... 10/13/2007 T-CD4 ([id number]) ... 10/13/2007 T-Comprehensive Metabolic Panel [id number]) ... 10/13/2007 T-Lipid Profile [id number]) ... 10/13/2007 T-Lipid Profile [id number]) ... 10/13/2007   Problem # 2:  ERECTILE DYSFUNCTION, MILD (ICD-302.72) Assessment: Improved Patient feels he is back to baseline.  ? truly due to HCTZ Orders: Est. Patient Level IV (18841)   Problem # 3:  HYPERTENSION, BENIGN ESSENTIAL (ICD-401.1) Wel controlled on ACEI His updated medication list for this problem includes:     Lisinopril 20 Mg Tabs (Lisinopril) .Marland Kitchen... Take 1 tablet by mouth once a day  Orders: Est. Patient Level IV (66063)   Problem # 4:  PREVENTIVE HEALTH CARE (ICD-V70.0) will think abouyt colonoscopy. flp next time. Orders: Est. Patient Level IV (01601)   Other Orders: Hepatitis A Vaccine (Adult Dose) (09323) Admin 1st Vaccine (55732) T-Chlamydia  Probe, urine [phone])     Patient Instructions: 1)  Please schedule a follow-up appointment in 4  months. 2)  Please come in fasting 10 hours prior to  your next blood draw.  3)  We will give you a heP A vaccine today.   ]  Hepatitis A Vaccine # 2    Vaccine Type: HepA    Site: right deltoid    Mfr: havrix    Dose: 0.5 ml    Route: IM    Given by: Starleen Arms CMA    Exp. Date: 07/17/2007    Lot #: [id number]    VIS given: 04/12/04 version given July 15, 2007.   Appended Document: Orders Update    Clinical Lists Changes  Orders: Added new Test order of T-CBC w/Diff [id number]) - Signed

## 2010-02-24 NOTE — Letter (Signed)
Summary: Adam Becker  Adam Becker   Imported By: Dorice Lamas 04/10/2006 14:14:47  _____________________________________________________________________  External Attachment:    Type:   Image     Comment:   External Document

## 2010-02-24 NOTE — Assessment & Plan Note (Signed)
Summary: F/U OV/VS   Visit Type:  Follow-up Primary Provider:  Paulette Blanch Dam MD  CC:  follow-up visit, Lipid Management, and Hypertension Management.  History of Present Illness: 55 year old AA male with HIV  very healthy CD4 count but who had persistent low grade viremia in the 100 to 400 range o intensified  regimen to etravirine, ritonavir boosted darunavir and raltegravir. 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 Since move to our new space his VL has become undetectable and his cd4 count has remained quite healthy well above 1000  . He did have syphiis and titer had come up. He had normal WBC on LP, negative VDRL, normal protein but positive CSF FTA-Abs. 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  I spent greater than 45 minutes with this pt including greater than 50% of face to face time counselling the pt. In additon to ARV regimen, HIV we discussed his hypertension and elevated cholesterol and needs to intervene for both of these conditions. He remains sexually inactive for past year after breakup with partner.   Hypertension History:      Positive major cardiovascular risk factors include male age 28 years old or older, hyperlipidemia, and hypertension.  Negative major cardiovascular risk factors include negative family history for ischemic heart disease and non-tobacco-user status.        Further assessment for target organ damage reveals no history of ASHD, stroke/TIA, or peripheral vascular disease.    Lipid Management History:      Positive NCEP/ATP III risk factors include male age 16 years old or older and hypertension.  Negative NCEP/ATP III risk factors include no family  history for ischemic heart disease, non-tobacco-user status, no ASHD (atherosclerotic heart disease), no prior stroke/TIA, no peripheral vascular disease, and no history of aortic aneurysm.    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 Take 1 Tablet By Mouth Once A Day 2)  Prezista 400 Mg Tabs (Darunavir Ethanolate) .... Take Two Tablets Once Daily With Norvir 3)  Isentress 400 Mg Tabs (Raltegravir Potassium) .... Take 1 Tablet By Mouth Two Times A Day 4)  Norvir 100 Mg Caps (Ritonavir) .... Take 1 Capsule By Mouth Once A Day With Two Prezista Tablets 5)  Valtrex 1 Gm Tabs (Valacyclovir Hcl) .... Take 1 Tablet By Mouth Two Times A Day For 5 Days 6)  Viagra 50 Mg Tabs (Sildenafil Citrate) .... Take One Half To One Tablet One Hour Prior To Sexual Activity 7)  Intelence 100 Mg Tabs (Etravirine) .... Take Two Tablets Twice Daily 8)  Bicillin L-A 2400000 Unit/45ml Susp (Penicillin G Benzathine) .... 2.35million  Units Im in Clinic Once Weekly X 3 Doses  Current Medications (verified): 1)  Prezista 400 Mg Tabs (Darunavir Ethanolate) .... Take Two Tablets Once Daily With Norvir 2)  Isentress 400 Mg Tabs (Raltegravir Potassium) .... Take 1 Tablet By Mouth Two Times A Day 3)  Norvir 100 Mg Caps (Ritonavir) .... Take 1 Capsule By Mouth Once A Day With Two Prezista Tablets 4)  Valtrex 1 Gm Tabs (Valacyclovir Hcl) .... Take 1 Tablet By Mouth Two Times A Day For 5  Days 5)  Viagra 50 Mg Tabs (Sildenafil Citrate) .... Take One Half To One Tablet One Hour Prior To Sexual Activity 6)  Intelence 100 Mg Tabs (Etravirine) .... Take Two Tablets Twice Daily 7)  Bicillin L-A 2400000 Unit/13ml Susp (Penicillin G Benzathine) .... 2.77million Units Im in Clinic Once Weekly X 3 Doses 8)  Lisinopril-Hydrochlorothiazide 20-12.5 Mg Tabs (Lisinopril-Hydrochlorothiazide) .... Take 1 Tablet By Mouth Once A Day 9)  Pravachol 40 Mg Tabs (Pravastatin Sodium) .... Take 1 Tablet By Mouth Once A Day  Allergies (verified): No Known Drug Allergies     Preventive Screening-Counseling & Management  Alcohol-Tobacco     Alcohol drinks/day: 0     Alcohol type: wine     Smoking Status: never     Passive Smoke Exposure: no  Caffeine-Diet-Exercise     Caffeine use/day: coffee and tea     Does Patient Exercise: yes     Type of exercise: workout at gym     Times/week: 4  Safety-Violence-Falls     Seat Belt Use: yes   Current Allergies (reviewed today): No known allergies  Review of Systems  The patient denies anorexia, fever, weight loss, weight gain, vision loss, decreased hearing, hoarseness, chest pain, syncope, dyspnea on exertion, peripheral edema, prolonged cough, headaches, hemoptysis, abdominal pain, melena, hematochezia, severe indigestion/heartburn, hematuria, incontinence, genital sores, muscle weakness, suspicious skin lesions, transient blindness, difficulty walking, depression, unusual weight change, abnormal bleeding, and enlarged lymph nodes.    Vital Signs:  Patient profile:   56 year old male Height:      73 inches (185.42 cm) Weight:      227 pounds (103.18 kg) BMI:     30.06 Temp:     97.8 degrees F (36.56 degrees C) oral Pulse rate:   71 / minute BP sitting:   146 / 100  (left arm)  Vitals Entered By: Baxter Hire) (November 01, 2009 3:28 PM) CC: follow-up visit, Lipid Management, Hypertension Management Pain Assessment Patient in pain? no       Nutritional Status BMI of > 30 = obese Nutritional Status Detail appetite is good per patient  Have you ever been in a relationship where you felt threatened, hurt or afraid?No   Does patient need assistance? Functional Status Self care Ambulation Normal        Medication Adherence: 11/01/2009   Adherence to medications reviewed with patient. Counseling to provide adequate adherence provided   Prevention For Positives: 11/01/2009   Safe sex practices discussed with patient. Condoms offered.   Education Materials Provided: 11/01/2009 Safe sex practices discussed with patient. Condoms offered.                          Physical Exam  General:  alert.  well-developed, well-nourished, and well-hydrated.   Head:  normocephalic.  atraumatic and no abnormalities observed.   Eyes:  vision grossly intact, pupils equal, pupils round, and pupils reactive to light.   Ears:  no external deformities.   Nose:  no external deformity.   Mouth:  good dentition.  pharynx pink and moist, no erythema, and no exudates.   Neck:  supple and full ROM.   Lungs:  normal respiratory effort, no accessory muscle use, no crackles, and no wheezes.   Heart:  normal rate, regular rhythm, no murmur, no gallop, and no rub.   Abdomen:  soft, non-tender, normal bowel sounds, and no distention.   Msk:  normal ROM.  no joint deformities.   Extremities:  No clubbing, cyanosis, edema, or deformity noted with normal full range of motion of all joints.   Neurologic:  alert & oriented X3,strength normal in all extremitiesgait normal.   Skin:  no rashes.  no suspicious lesions.   Psych:  Oriented X3, memory intact for recent and remote, normally interactive, and good eye contact.     Impression & Recommendations:  Problem # 1:  HIV INFECTION (ICD-042) Current regimen is wokring great. I told him he can take the etravirine once daily if he so chooses. Could back down to a less intense regimen, but I think  this is not desirable at this point. While our prior lab was imperfect in how it stored blood for viral loads and hence led to higher VL than would have been detected, he did have a gentype with fairly resistant virus found when viral load was in 600s. His updated medication list for this problem includes:    Valtrex 1 Gm Tabs (Valacyclovir hcl) .Marland Kitchen... Take 1 tablet by mouth two times a day for 5 days    Bicillin L-a 2400000 Unit/39ml Susp (Penicillin g benzathine) .Marland Kitchen... 2.77million units im in clinic once weekly x 3 doses  Orders: T-GC Probe, urine ([id number]) T-Chlamydia  Probe, urine ([id number]) Est. Patient Level V (99215)Future Orders: T-HIV Viral Load ([id number]) ... 02/23/2010 T-CBC w/Diff ([id number]) ... 02/23/2010 T-Comprehensive Metabolic Panel [phone]) ... 02/23/2010 T-RPR (Syphilis) [phone]) ... 02/23/2010 T-Lipid Profile [phone]) ... 02/23/2010 T-CD4SP ([facility]) (CD4SP) ... 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 Patient Level V (51884)  Problem # 3:  HYPERTENSION, BENIGN ESSENTIAL (ICD-401.1)  Add in HCTZ as combo pill The following medications were removed from the medication list:    Lisinopril 20 Mg Tabs (Lisinopril) .Marland Kitchen... Take 1 tablet by mouth once a day His updated medication list for this problem includes:    Lisinopril-hydrochlorothiazide 20-12.5 Mg Tabs (Lisinopril-hydrochlorothiazide) .Marland Kitchen... Take 1 tablet by mouth once a day  Orders: Est. Patient Level V (16606)  Problem # 4:  HYPERLIPIDEMIA (ICD-272.4)  add in statin to opitimize his LDL His updated medication list for this problem includes:    Pravachol 40 Mg Tabs (Pravastatin sodium) .Marland Kitchen... Take 1 tablet by mouth once a day  Orders: Est. Patient Level V (30160)  Medications Added to Medication List This Visit: 1)  Lisinopril-hydrochlorothiazide 20-12.5 Mg  Tabs (Lisinopril-hydrochlorothiazide) .... Take 1 tablet by mouth once a day 2)  Pravachol 40 Mg Tabs (Pravastatin sodium) .... Take 1 tablet by mouth once a day  Other Orders: Influenza Vaccine NON MCR (10932)  Hypertension Assessment/Plan:      The patient's hypertensive risk group is category B: At least one risk factor (excluding diabetes) with no target organ damage.  His calculated 10 year risk of coronary heart disease is 22 %.  Today's blood pressure is 146/100.  His blood pressure goal is < 140/90.  Lipid Assessment/Plan:      Based on NCEP/ATP III, the patient's risk factor category is "0-1 risk factors".  The patient's lipid goals are as follows: Total cholesterol goal is 200; LDL cholesterol goal is 100; HDL cholesterol goal is 40; Triglyceride goal is 150.  His LDL cholesterol goal has not been met.    Patient Instructions: 1)  rtc in February    Immunizations Administered:  Influenza Vaccine # 1:    Vaccine Type: Fluvax Non-MCR    Site: right deltoid    Mfr: Novartis    Dose: 0.5 ml    Route: IM    Given by: Kathi Simpers CMA(AAMA)    Exp. Date: 04/24/2010    Lot #: [id number]    VIS given: 08/17/09 version given November 01, 2009.  Flu Vaccine Consent Questions:    Do you have a history of severe allergic reactions to this vaccine? no    Any prior history of allergic reactions to egg and/or gelatin? no    Do you have a sensitivity to the preservative Thimersol? no    Do you have a past history of Guillan-Barre Syndrome? no    Do you currently have an acute febrile illness? no    Have you ever had a severe reaction to latex? no    Vaccine information given and explained to patient? yes Prescriptions: INTELENCE 100 MG TABS (ETRAVIRINE) take two tablets twice daily  #360 Tablet x 4   Entered and Authorized by:   Acey Lav MD   Signed by:   Paulette Blanch Dam MD on 11/01/2009   Method used:   Electronically to        [facility] [phone]* (retail)       [address]       [place], [place]  [address]       Ph: [phone]       Fax: [phone]   RxID:   [id number] VALTREX 1 GM TABS (VALACYCLOVIR HCL) Take 1 tablet by mouth two times a day for 5 days  #10 Tablet x 2   Entered and Authorized by:   Acey Lav MD   Signed by:   Paulette Blanch Dam MD on 11/01/2009   Method used:   Electronically to        [facility] [phone]* (retail)       [address]       [place], [place]  [address]       Ph: [phone]       Fax: [phone]   RxID:   [id number] NORVIR 100 MG CAPS (RITONAVIR) Take 1 capsule by mouth once a day with two prezista tablets  #90 x 4   Entered and Authorized by:   Acey Lav MD   Signed by:   Paulette Blanch Dam MD on 11/01/2009   Method used:   Electronically to        [facility] [phone]* (retail)       [address]       [place], [place]  [address]       Ph: [phone]       Fax: [phone]   RxID:   [id number] ISENTRESS 400 MG TABS (RALTEGRAVIR POTASSIUM) Take 1 tablet by mouth two times a day  #180 x  3   Entered and Authorized by:   Acey Lav MD   Signed by:   Paulette Blanch Dam MD on 11/01/2009   Method used:   Electronically to        [facility] [phone]* (retail)       [address]       [place], [place]  [address]       Ph: [phone]       Fax: [phone]   RxID:   [id number] PREZISTA 400 MG TABS (DARUNAVIR ETHANOLATE) take two tablets once daily with norvir  #180 x 3   Entered and Authorized by:   Acey Lav MD   Signed by:   Paulette Blanch Dam MD on 11/01/2009   Method used:   Electronically to        [facility] [phone]* (retail)       [address]       [place], [place]  [address]       Ph: [phone]       Fax: [phone]   RxID:   [id number] PRAVACHOL 40 MG TABS (PRAVASTATIN SODIUM) Take 1 tablet by mouth once a day  #30 x 11   Entered and Authorized by:   Acey Lav MD   Signed by:   Paulette Blanch Dam MD on 11/01/2009   Method used:   Electronically to        [facility] [phone]* (retail)       [address]       [place], [place]  [address]       Ph: [phone]       Fax: [phone]   RxID:   [id number] LISINOPRIL-HYDROCHLOROTHIAZIDE 20-12.5 MG TABS (LISINOPRIL-HYDROCHLOROTHIAZIDE) Take 1 tablet by mouth once a day  #30 x 11   Entered and Authorized by:   Acey Lav MD   Signed by:   Paulette Blanch Dam MD on 11/01/2009   Method used:   Electronically to        [facility] [phone]* (retail)       [address]       [place], [place]  [address]       Ph: [phone]       Fax: [phone]   RxID:   [id number]

## 2010-02-24 NOTE — Assessment & Plan Note (Signed)
Summary: Bicillin -LA #2   Nurse Visit    Prior Medications: TRUVADA 200-300 MG TABS (EMTRICITABINE-TENOFOVIR) once daily KALETRA 200-50 MG TABS (LOPINAVIR-RITONAVIR) two twice a day LISINOPRIL 20 MG  TABS (LISINOPRIL) Take 1 tablet by mouth once a day BICILLIN L-A 2400000 UNIT/4ML SUSP (PENICILLIN G BENZATHINE) 2.4 million UNITS IM once weekly for three doses Current Allergies: No known allergies     Medication Administration  Injection # 1:    Medication: Penicillin G 1.2 million units    Diagnosis: LATE SYPHILIS, LATENT (ICD-096)    Route: IM    Site: RUOQ gluteus    Exp Date: 07/12    Lot #: [id number]    Mfr: Dylynn Ketner-pharm    Patient tolerated injection without complications    Given by: Tomasita Morrow RN (November 25, 2007 10:51 AM)  Injection # 2:    Medication: Penicillin G 1.2 million units    Diagnosis: LATE SYPHILIS, LATENT (ICD-096)    Route: IM    Site: LUOQ gluteus    Exp Date: 7-12    Lot #: [id number]    Mfr: Chemika Nightengale-Pharm    Given by: Tomasita Morrow RN (November 25, 2007 10:52 AM)  Orders Added: 1)  Penicillin G 1.2 million units [J0570] 2)  Penicillin G 1.2 million units [J0570] 3)  Admin of Therapeutic Inj  intramuscular or subcutaneous Lepidus.Putnam    ]  Medication Administration  Injection # 1:    Medication: Penicillin G 1.2 million units    Diagnosis: LATE SYPHILIS, LATENT (ICD-096)    Route: IM    Site: RUOQ gluteus    Exp Date: 07/12    Lot #: [id number]    Mfr: Chaynce Schafer-pharm    Patient tolerated injection without complications    Given by: Tomasita Morrow RN (November 25, 2007 10:51 AM)  Injection # 2:    Medication: Penicillin G 1.2 million units    Diagnosis: LATE SYPHILIS, LATENT (ICD-096)    Route: IM    Site: LUOQ gluteus    Exp Date: 7-12    Lot #: [id number]    Mfr: Varsha Knock-Pharm    Given by: Tomasita Morrow RN (November 25, 2007 10:52 AM)  Orders Added: 1)  Penicillin G 1.2 million units [J0570] 2)  Penicillin G 1.2 million units [J0570] 3)  Admin of Therapeutic  Inj  intramuscular or subcutaneous [29562]

## 2010-02-24 NOTE — Assessment & Plan Note (Signed)
Summary: resfrom 9/1kam   History of Present Illness: 55 year old AA male with HIV  very healthy CD4 count but with persistent low grade viremia in the 100 to 400 range on on of truvada, raltegravir, once daily ritonavir boosted darunavir. . 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 We again discussed intensifying his regien and he agreed to have etravirine added to his raltegravir and norvir boosted prezista. We also discussed going potentially to twice daily prezista but he wished to intensify with etravirine, raltegravir and prezista/norvir once daily for now. He had no other significant complaints. He did wish to proceed with screening colonoscopy referral.  Hypertension History:      Positive major cardiovascular risk factors include male age 58 years old or older and hypertension.  Negative major cardiovascular risk factors include negative family history for ischemic heart disease and non-tobacco-user status.    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 Once Daily 2)  Lisinopril 20 Mg  Tabs (Lisinopril) .... Take 1 Tablet By Mouth Once A Day 3)  Bicillin L-A 2400000 Unit/68ml Susp (Penicillin G Benzathine) .... 2.4 Million Units Im Once Weekly For Three Doses 4)  Prezista 400 Mg Tabs (Darunavir  Ethanolate) .... Take Two Tablets Once Daily With Norvir 5)  Isentress 400 Mg Tabs (Raltegravir Potassium) .... Take 1 Tablet By Mouth Two Times A Day 6)  Norvir 100 Mg Caps (Ritonavir) .... Take 1 Capsule By Mouth Once A Day With Two Prezista Tablets 7)  Valtrex 1 Gm Tabs (Valacyclovir Hcl) .... Take 1 Tablet By Mouth Two Times A Day For 5 Days 8)  Viagra 50 Mg Tabs (Sildenafil Citrate) .... Take One Half To One Tablet One Hour Prior To Sexual Activity  Current Medications (verified): 1)  Lisinopril 20 Mg  Tabs (Lisinopril) .... Take 1 Tablet By Mouth Once A Day 2)  Prezista 400 Mg Tabs (Darunavir Ethanolate) .... Take Two Tablets Once Daily With Norvir 3)  Isentress 400 Mg Tabs (Raltegravir Potassium) .... Take 1 Tablet By Mouth Two Times A Day 4)  Norvir 100 Mg Caps (Ritonavir) .... Take 1 Capsule By Mouth Once A Day With Two Prezista Tablets 5)  Valtrex 1 Gm Tabs (Valacyclovir Hcl) .... Take 1 Tablet By Mouth Two Times A Day For 5 Days 6)  Viagra 50 Mg Tabs (Sildenafil Citrate) .... Take One Half To One Tablet One Hour Prior To Sexual Activity 7)  Intelence 100 Mg Tabs (Etravirine) .... Take Two Tablets Twice Daily  Allergies (verified): No Known Drug Allergies     Current Allergies (reviewed today): No known allergies  Family History: No hx of early CAD Dad with colon cancer in 5s  Social History: Reviewed history from 07/15/2007 and no changes required. Never Smoked Alcohol use-yes Drug use-no Regular exercise-yes Occupation: on  disability  Review of Systems  The patient denies anorexia, fever, weight loss, weight gain, vision loss, decreased hearing, hoarseness, chest pain, syncope, dyspnea on exertion, peripheral edema, prolonged cough, headaches, hemoptysis, abdominal pain, melena, hematochezia, muscle weakness, suspicious skin lesions, transient blindness, and depression.    Vital Signs:  Patient profile:   55 year old male Height:      73 inches (185.42 cm)  Weight:      213.38 pounds (96.99 kg) BMI:     28.25 Temp:     97.1 degrees F (36.17 degrees C) oral Pulse rate:   72 / minute BP sitting:   126 / 79  (left arm)  Vitals Entered By: Starleen Arms CMA (September 21, 2008 4:20 PM)  Physical Exam  General:  alert.   Head:  normocephalic.   Eyes:  vision grossly intact, pupils equal, pupils round, and pupils reactive to light.   Ears:  no external deformities.   Nose:  no external deformity.   Mouth:  good dentition.   Lungs:  normal respiratory effort, no crackles, and no wheezes.   Heart:  normal rate, regular rhythm, no murmur, no gallop, and no rub.   Abdomen:  soft, non-tender, normal bowel sounds, and no distention.   Neurologic:  alert & oriented X3, cranial nerves II-XII intact, strength normal in all extremities   Impression & Recommendations:  Problem # 1:  HIV INFECTION (ICD-042) Will intensify therapy with etravirine. He also was not taking his prezista properly taking 400mg  two times a day instead of 800mg  daily with norvir. If he is not suppressed on the new regimen I will switch to twice daily prezista and norvir  in place of his once daily prescribved prezista with norvir The following medications were removed from the medication list:    Bicillin L-a 2400000 Unit/86ml Susp (Penicillin g benzathine) .Marland Kitchen... 2.4 million units im once weekly for three doses His updated medication list for this problem includes:    Valtrex 1 Gm Tabs (Valacyclovir hcl) .Marland Kitchen... Take 1 tablet by mouth two times a day for 5 days  Orders: T-GC Probe, urine [phone]) T-Chlamydia  Probe, urine ([id number]) Est. Patient Level IV (99214)Future Orders: T-HIV Viral Load ([id number]) ... 11/02/2008 T-CBC w/Diff ([id number]) ... 11/02/2008 T-Comprehensive Metabolic Panel [id number]) ... 11/02/2008 T-RPR (Syphilis) [id number]) ... 11/02/2008  Diagnostics Reviewed:  HIV: HIV positive - not AIDS (11/18/2007)   CD4: 980 (09/09/2008)   CD4 %:  20 (04/02/2006) WBC: 9.5 (09/08/2008)   Hgb: 14.0 (09/08/2008)   HCT: 43.9 (09/08/2008)   Platelets: 372 (09/08/2008) HIV genotype: REPORT (10/15/2006)   HIV-1 RNA: 404 (09/08/2008)   HBSAg: No (03/19/2006)  Problem # 2:  NEOPLASM, MALIGNANT, COLON, FAMILY HX, FATHER (ICD-V16.0) refer for colonoscopy Orders: Screening Colonoscopy (Colonscpy - Screen) Est. Patient Level IV (25956)  Problem # 3:  HYPERTENSION, BENIGN ESSENTIAL (ICD-401.1)  AT goal His updated medication list for this problem includes:    Lisinopril 20 Mg Tabs (Lisinopril) .Marland Kitchen... Take 1 tablet by mouth once a day  Orders: Est. Patient Level IV (38756)  Problem # 4:  LATE SYPHILIS, LATENT (ICD-096)  check RPR  Orders: Est. Patient Level IV (43329)  Medications Added to Medication List This Visit: 1)  Intelence 100 Mg Tabs (Etravirine) .... Take two tablets twice daily  Hypertension Assessment/Plan:      The patient's hypertensive risk group is category B: At least one risk factor (excluding diabetes) with no target organ damage.  His calculated 10 year risk of coronary heart  disease is 9 %.  Today's blood pressure is 126/79.  His blood pressure goal is < 140/90.       Medication Adherence: 09/21/2008   Adherence to medications reviewed with patient. Counseling to provide adequate adherence provided   Prevention For Positives: 09/21/2008   Safe sex practices discussed with patient. Condoms offered.   Education Materials Provided: 09/21/2008 Safe sex practices discussed with patient. Condoms offered.                           Patient Instructions: 1)  This is how your new medicines should be taken 2)  Intelence (etravirine) TAKE TWO TABLETS (WHITE ONES) TWICE DAILY 3)  ISENTRESS (raltegravir-Pink ones) TAKE ONE TABLET TWICE  DAILY 4)  PREZISTA 400MG  (Darunavir-Light Orange) TAKE TWO TABLETS ONCE A A DAY WITH 5)  NORVIR (ritonavir) 100mg  (white tablet) ONCE DAILY 6)  Make fu appt with Daiva Eves in 2 months  Prescriptions: INTELENCE 100 MG TABS (ETRAVIRINE) take two tablets twice daily  #360 x 12   Entered and Authorized by:   Acey Lav MD   Signed by:   Paulette Blanch Dam MD on 09/21/2008   Method used:   Print then Give to Patient   RxID:   [id number] NORVIR 100 MG CAPS (RITONAVIR) Take 1 capsule by mouth once a day with two prezista tablets  #90 x 4   Entered and Authorized by:   Acey Lav MD   Signed by:   Paulette Blanch Dam MD on 09/21/2008   Method used:   Electronically to        [facility] [phone]* (retail)       [address]       [place], [place]  [address]       Ph: [phone]       Fax: [phone]   RxID:   [id number] ISENTRESS 400 MG TABS (RALTEGRAVIR POTASSIUM) Take 1 tablet by mouth two times a day  #180 x 4   Entered and Authorized by:   Acey Lav MD   Signed by:   Paulette Blanch Dam MD on 09/21/2008   Method used:   Electronically to        [facility] [phone]* (retail)       [address]       [place], [place]  [address]       Ph: [phone]       Fax: [phone]   RxID:   [id number] PREZISTA 400 MG TABS (DARUNAVIR ETHANOLATE) take two tablets once daily with norvir  #180 x 4   Entered and Authorized by:   Acey Lav MD   Signed by:   Paulette Blanch Dam MD on 09/21/2008   Method used:   Electronically to        [facility] [phone]* (retail)       [address]       [place], [place]  [address]       Ph: [phone]       Fax: [phone]   RxID:   [id number] INTELENCE 100 MG TABS (ETRAVIRINE) take two tablets twice daily  #360 x 12   Entered and Authorized by:   Acey Lav MD   Signed by:  Acey Lav MD on 09/21/2008   Method used:   Electronically to        [facility] [phone]* (retail)       [address]       [place], [place]  [address]       Ph: [phone]       Fax: [phone]   RxID:   [id number]  Process Orders  Check Orders Results:     [facility]: ABN not required for this insurance Tests Sent for requisitioning (September 23, 2008 8:14 PM):     11/02/2008: [facility] -- T-HIV Viral Load [phone] (signed)     11/02/2008: [facility] -- T-CBC w/Diff [[id number]] (signed)     11/02/2008: [facility] -- T-Comprehensive Metabolic Panel [[id number]] (signed)     11/02/2008: [facility] -- T-RPR (Syphilis) [phone] (signed)     09/21/2008: [facility] -- T-GC Probe, urine [phone] (signed)     09/21/2008: [facility] -- [employer], urine [phone] (signed)

## 2010-02-24 NOTE — Miscellaneous (Signed)
Summary: RW Update  Clinical Lists Changes  Observations: Added new observation of YEARAIDSPOS: 2010  (03/12/2009 15:20) Added new observation of HIV STATUS: CDC-defined AIDS  (03/12/2009 15:20)

## 2010-02-24 NOTE — Assessment & Plan Note (Signed)
Summary: INJ/CH  Nurse Visit   Vitals Entered By: Jennet Maduro RN (March 16, 2009 2:55 PM)  Allergies: No Known Drug Allergies  Medication Administration  Injection # 1:    Medication: Bicillin LA 1.2 million units Injection    Diagnosis: LATE SYPHILIS, LATENT (ICD-096)    Route: IM    Site: RUOQ gluteus    Exp Date: 11/24/2011    Lot #: [id number]    Mfr: Brooke Dare    Patient tolerated injection without complications    Given by: Jennet Maduro RN (March 16, 2009 2:56 PM)  Injection # 2:    Medication: Bicillin LA 1.2 million units Injection    Diagnosis: LATE SYPHILIS, LATENT (ICD-096)    Route: IM    Site: LUOQ gluteus    Exp Date: 11/24/2011    Lot #: [id number]    Mfr: Brooke Dare    Patient tolerated injection without complications    Given by: Jennet Maduro RN (March 16, 2009 2:57 PM)  Orders Added: 1)  Bicillin LA 1.2 million units Injection [J0570] 2)  Bicillin LA 1.2 million units Injection [J0570] 3)  Admin of Therapeutic Inj  intramuscular or subcutaneous [96372]   Medication Administration  Injection # 1:    Medication: Bicillin LA 1.2 million units Injection    Diagnosis: LATE SYPHILIS, LATENT (ICD-096)    Route: IM    Site: RUOQ gluteus    Exp Date: 11/24/2011    Lot #: [id number]    Mfr: Brooke Dare    Patient tolerated injection without complications    Given by: Jennet Maduro RN (March 16, 2009 2:56 PM)  Injection # 2:    Medication: Bicillin LA 1.2 million units Injection    Diagnosis: LATE SYPHILIS, LATENT (ICD-096)    Route: IM    Site: LUOQ gluteus    Exp Date: 11/24/2011    Lot #: [id number]    Mfr: Brooke Dare    Patient tolerated injection without complications    Given by: Jennet Maduro RN (March 16, 2009 2:57 PM)  Orders Added: 1)  Bicillin LA 1.2 million units Injection [J0570] 2)  Bicillin LA 1.2 million units Injection [J0570] 3)  Admin of Therapeutic Inj  intramuscular or subcutaneous [[id number]]

## 2010-02-24 NOTE — Letter (Signed)
Summary: OV  OV   Imported By: Dorice Lamas 04/10/2006 14:13:30  _____________________________________________________________________  External Attachment:    Type:   Image     Comment:   External Document

## 2010-02-24 NOTE — Progress Notes (Signed)
Summary: OV 04/16/2006  OV 04/16/2006   Imported By: Olena Leatherwood 04/17/2006 09:22:47  _____________________________________________________________________  External Attachment:    Type:   Image     Comment:   External Document

## 2010-02-24 NOTE — Miscellaneous (Signed)
Summary: HIPAA Restrictions  HIPAA Restrictions   Imported By: Florinda Marker 10/22/2007 15:03:38  _____________________________________________________________________  External Attachment:    Type:   Image     Comment:   External Document

## 2010-02-24 NOTE — Assessment & Plan Note (Signed)
Summary: BICILLIN INJECTION #2  Nurse Visit   Vitals Entered By: Jennet Maduro RN (March 09, 2009 2:36 PM) CC: Bicillin LA #2   Allergies: No Known Drug Allergies  Medication Administration  Injection # 1:    Medication: Bicillin LA 1.2 million units Injection    Diagnosis: LATE SYPHILIS, LATENT (ICD-096)    Route: IM    Site: RUOQ gluteus    Exp Date: 11/24/2011    Lot #: [id number]    Mfr: king    Patient tolerated injection without complications    Given by: Jennet Maduro RN (March 09, 2009 2:40 PM)  Injection # 2:    Medication: Bicillin LA 1.2 million units Injection    Diagnosis: LATE SYPHILIS, LATENT (ICD-096)    Route: IM    Site: LUOQ gluteus    Exp Date: 11/24/2011    Lot #: [id number]    Mfr: king    Patient tolerated injection without complications    Given by: Jennet Maduro RN (March 09, 2009 2:41 PM)  Orders Added: 1)  Bicillin LA 1.2 million units Injection [J0570] 2)  Bicillin LA 1.2 million units Injection [J0570] 3)  Admin of Therapeutic Inj  intramuscular or subcutaneous [96372]   Medication Administration  Injection # 1:    Medication: Bicillin LA 1.2 million units Injection    Diagnosis: LATE SYPHILIS, LATENT (ICD-096)    Route: IM    Site: RUOQ gluteus    Exp Date: 11/24/2011    Lot #: [id number]    Mfr: king    Patient tolerated injection without complications    Given by: Jennet Maduro RN (March 09, 2009 2:40 PM)  Injection # 2:    Medication: Bicillin LA 1.2 million units Injection    Diagnosis: LATE SYPHILIS, LATENT (ICD-096)    Route: IM    Site: LUOQ gluteus    Exp Date: 11/24/2011    Lot #: [id number]    Mfr: king    Patient tolerated injection without complications    Given by: Jennet Maduro RN (March 09, 2009 2:41 PM)  Orders Added: 1)  Bicillin LA 1.2 million units Injection [J0570] 2)  Bicillin LA 1.2 million units Injection [J0570] 3)  Admin of Therapeutic Inj  intramuscular or subcutaneous [29562]

## 2010-02-24 NOTE — Miscellaneous (Signed)
Summary: Orders Update  Clinical Lists Changes  Orders: Added new Test order of T-CD4SP [facility]) (CD4SP) - Signed

## 2010-02-24 NOTE — Assessment & Plan Note (Signed)
Summary: PCN INJ/VS  Nurse Visit    Prior Medications: TRUVADA 200-300 MG TABS (EMTRICITABINE-TENOFOVIR) once daily KALETRA 200-50 MG TABS (LOPINAVIR-RITONAVIR) two twice a day LISINOPRIL 20 MG  TABS (LISINOPRIL) Take 1 tablet by mouth once a day BICILLIN L-A 2400000 UNIT/4ML SUSP (PENICILLIN G BENZATHINE) 2.4 million UNITS IM once weekly for three doses Current Allergies: No known allergies     Medication Administration  Injection # 1:    Medication: Bicillin CR 1.2 million units Injection    Diagnosis: LATE SYPHILIS, LATENT (ICD-096)    Route: IM    Site: RUOQ gluteus    Exp Date: 07/25/2010    Lot #: [id number]    Mfr: king pharm    Patient tolerated injection without complications    Given by: Jennet Maduro RN (December 02, 2007 10:33 AM)  Injection # 2:    Medication: Bicillin CR 1.2 million units Injection    Diagnosis: LATE SYPHILIS, LATENT (ICD-096)    Route: IM    Site: LUOQ gluteus    Exp Date: 07/25/2010    Lot #: [id number]    Mfr: king pharm    Patient tolerated injection without complications    Given by: Jennet Maduro RN (December 02, 2007 10:34 AM)  Orders Added: 1)  Bicillin CR 1.2 million units Injection [J0540] 2)  Bicillin CR 1.2 million units Injection [J0540] 3)  Admin of Therapeutic Inj  intramuscular or subcutaneous Lepidus.[place]    ]

## 2010-02-24 NOTE — Assessment & Plan Note (Signed)
Summary: f/u ov/   Chief Complaint:  f/u ov  lab results.  History of Present Illness: 55 year old AA male with HIV on [place] and truvada  with genotype roughly a year ago showing TAMS, M41L, D67N, K70R, L74V, V75A,  T215Y, K219Q  a M184V, making him I to Tenofovir, R to rest of NRTI, and as well as NNRTI mutation of  G190E, G190Q, making him R to efavirens and nevirapene with low level activity of etravirine, Protease Gene: L10I, D30N, N88D with slight R to ATV. His viral load had been undetectable and then up to 127 in June now 179, his cd4 count is 1180. He claims near 100% adherence to his current regimen. I had previously considered changing him to salvage regimen of darunavir/ritonavir, raltegavir and etravirine but had opted to keep him on his current regimen because of his low viral load. In the interim he had a RPR pop back up from 1:1 to 1:8. Lp failed to show leukocytosis, elevated protein or VDRL. FTA ab was positive but this is high sens but low spec and without the protein being elevated not good enough for neurosyphilis. I am starting him on IM PCN qweekly for this for 3 weeks. I spent 20 minutes discussing changing his ARV from [place] to etravirine, ritonavir boosted darunavir and raltegravir. I also offered him the option of simply adding in the raltegraivr to his [place]. He will consider this change and I will call him again to discuss this. He will make f/u appt with me within the next month.Marland Kitchen    Updated Prior Medication List: TRUVADA 200-300 MG TABS (EMTRICITABINE-TENOFOVIR) once daily KALETRA 200-50 MG TABS (LOPINAVIR-RITONAVIR) two twice a day LISINOPRIL 20 MG  TABS (LISINOPRIL) Take 1 tablet by mouth once a day BICILLIN L-A 2400000 UNIT/4ML SUSP (PENICILLIN G BENZATHINE) 2.4 million UNITS IM once weekly for three doses  Current Allergies (reviewed today): No known allergies   Past Medical History:    HIV with R profile noted in emr    ERectile  dysfunction    HTN    Late Syphilis    Risk Factors: Tobacco use:  never Passive smoke exposure:  no Drug use:  no HIV high-risk behavior:  no Caffeine use:  2 drinks per day Alcohol use:  yes    Type:  wine    Drinks per day:  <1 Exercise:  yes    Times per week:  4    Type:  workout at gym Seatbelt use:  100 %  Family History Risk Factors:    Family History of MI in females < 36 years old:  no    Family History of MI in males < 3 years old:  no   Review of Systems  The patient denies anorexia, fever, weight loss, weight gain, vision loss, decreased hearing, hoarseness, chest pain, syncope, dyspnea on exertion, peripheral edema, prolonged cough, headaches, hemoptysis, abdominal pain, melena, hematochezia, severe indigestion/heartburn, hematuria, incontinence, genital sores, muscle weakness, and suspicious skin lesions.     Vital Signs:  Patient Profile:   55 Years Old Male Height:     73 inches (185.42 cm) Weight:      209.0 pounds BMI:     27.67 Temp:     97.3 degrees F oral Pulse rate:   75 / minute BP sitting:   127 / 82  (left arm)  Pt. in pain?   no  Vitals Entered By: Tomasita Morrow RN (November 18, 2007 3:42 PM)  Is Patient Diabetic? No Nutritional Status Detail normal  Have you ever been in a relationship where you felt threatened, hurt or afraid?No  Domestic Violence Intervention none  Does patient need assistance? Functional Status Self care Ambulation Normal     Physical Exam  General:     alert.   Eyes:     vision grossly intact, pupils equal, pupils round, and pupils reactive to light.   Ears:     no external deformities.   Nose:     no external deformity.   Mouth:     good dentition.   Lungs:     normal respiratory effort, no crackles, and no wheezes.   Heart:     normal rate, regular rhythm, no murmur, no gallop, and no rub.   Abdomen:     soft, non-tender, normal bowel sounds, and no distention.   Neurologic:      alert & oriented X3, cranial nerves II-XII intact, strength normal in all extremities    Impression & Recommendations:  Problem # 1:  HIV INFECTION (ICD-042) AGain options are changing to etravirine, raltegravir and ritonavir boosted darunavir vs simply adding raltegravir to his [place] and truvada regimen. I am concerned that with [place], truvada alone he only has ONE FULLY active drug and a second partially active drug in TNF. I would like to give him 3 active drugs. His updated medication list for this problem includes:    Truvada 200-300 Mg Tabs (Emtricitabine-tenofovir) ..... Once daily    Kaletra 200-50 Mg Tabs (Lopinavir-ritonavir) .Marland Kitchen..Marland Kitchen Two twice a day    Bicillin L-a 2400000 Unit/35ml Susp (Penicillin g benzathine) .Marland Kitchen... 2.4 million units im once weekly for three doses  Orders: Est. Patient Level III (62952)  Future Orders: T-CD4 ([id number]) ... 12/30/2007 T-HIV Viral Load [phone]) ... 12/30/2007 T-CBC w/Diff ([id number]) ... 12/30/2007 T-Comprehensive Metabolic Panel [id number]) ... 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 Patient Level III (18841)   Problem # 3:  HYPERTENSION, BENIGN ESSENTIAL (ICD-401.1)  His updated medication list for this problem includes:    Lisinopril 20 Mg Tabs (Lisinopril) .Marland Kitchen... Take 1 tablet by mouth once a day  Orders: Est. Patient Level III (66063)      Patient Instructions: 1)  I would like to change you to the following regimen that has THREE fully active drugs against your virus: 2)  Raltegravir 400mg  tablet twice daily 3)  Darunavir 400mg  tablet take two tablets once daily with 4)  Ritonavir 100mg  one tablet daily and 5)  Etravirine 100mg  two tablets twice daily (this one could be dissovled in H20 and drank if you then put new water and down second glass) 6)  Please make f.u appt to see Dr Daiva Eves in  4 to 6 weeks 7)  You need shot of PCN today, next week and week after that   ]  Appended Document: Orders Update  I am changin Mr. Maute to darunavir, ritonavir, raltegravir and truvada. 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  Clinical Lists Changes  Medications: Removed medication of KALETRA 200-50 MG TABS (LOPINAVIR-RITONAVIR) two twice a day Added new medication of PREZISTA 400 MG TABS (DARUNAVIR ETHANOLATE) take two tablets once daily with norvir - Signed Added new medication of ISENTRESS 400 MG TABS (RALTEGRAVIR POTASSIUM) Take 1 tablet by mouth two times a  day - Signed Added new medication of NORVIR 100 MG CAPS (RITONAVIR) Take 1 capsule by mouth once a day with two prezista tablets - Signed Added new medication of VALTREX 1 GM TABS (VALACYCLOVIR HCL) Take 1 tablet by mouth two times a day for 5 days - Signed Rx of PREZISTA 400 MG TABS (DARUNAVIR ETHANOLATE) take two tablets once daily with norvir;  #180 x 4;  Signed;  Entered by: Acey Lav MD;  Authorized by: Paulette Blanch Dam MD;  Method used: [facility] to [facility] [address] [place], [place], [place]  [address], Ph: [phone], Fax: [phone] Rx of ISENTRESS 400 MG TABS (RALTEGRAVIR POTASSIUM) Take 1 tablet by mouth two times a day;  #180 x 4;  Signed;  Entered by: Acey Lav MD;  Authorized by: Paulette Blanch Dam MD;  Method used: Electronically to [facility]. [facility]*, [address], [place], [place], [place]  [address], Ph: [phone], Fax: [phone] Rx of TRUVADA 200-300 MG TABS (EMTRICITABINE-TENOFOVIR) once daily;  #90 x 4;  Signed;  Entered by: Acey Lav MD;  Authorized by: Paulette Blanch Dam MD;  Method used: Electronically to [facility]. [facility]*, [address] [place], [place], [place]  [address], Ph: [phone], Fax: [phone] Rx of NORVIR 100 MG CAPS (RITONAVIR) Take 1 capsule by mouth once a day with two prezista  tablets;  #90 x 4;  Signed;  Entered by: Acey Lav MD;  Authorized by: Paulette Blanch Dam MD;  Method used: Electronically to [facility]*, [address], [place], [place], [place]  [address], Ph: [phone], Fax: [phone] Rx of VALTREX 1 GM TABS (VALACYCLOVIR HCL) Take 1 tablet by mouth two times a day for 5 days;  #10 x 4;  Signed;  Entered by: Acey Lav MD;  Authorized by: Paulette Blanch Dam MD;  Method used: Electronically to [facility]. [facility]*, [address] [place], [place], [place]  [address], Ph: [phone], Fax: [phone] Rx of VALTREX 1 GM TABS (VALACYCLOVIR HCL) Take 1 tablet by mouth two times a day for 5 days;  #10 x 4;  Signed;  Entered by: Acey Lav MD;  Authorized by: Paulette Blanch Dam MD;  Method used: Electronically to [facility]. #[facility]*, [address] [place], [place], [place]  [address], Ph: [phone], Fax: [phone]    Prescriptions: VALTREX 1 GM TABS (VALACYCLOVIR HCL) Take 1 tablet by mouth two times a day for 5 days  #10 x 4   Entered and Authorized by:   Acey Lav MD   Signed by:   Paulette Blanch Dam MD on 12/02/2007   Method used:   Electronically to        [facility]. [phone]* (retail)       [address]       [place], [place]  [address]       Ph: [phone]       Fax: [phone]   RxID:   [id number] VALTREX 1 GM TABS (VALACYCLOVIR HCL) Take 1 tablet by mouth two times a day for 5 days  #10 x 4   Entered and Authorized by:   Acey Lav MD   Signed by:   Paulette Blanch Dam MD on 12/02/2007   Method used:   Electronically to  [facility] [phone]* (retail)       [address]       [place], [place]  [address]       Ph: [phone]       Fax: [phone]   RxID:   [id number] NORVIR 100 MG CAPS (RITONAVIR) Take 1 capsule by mouth once a day with two prezista tablets  #90 x 4   Entered and Authorized by:   Acey Lav MD   Signed by:   Paulette Blanch Dam MD on  12/02/2007   Method used:   Electronically to        [facility] [phone]* (retail)       [address]       [place], [place]  [address]       Ph: [phone]       Fax: [phone]   RxID:   [id number] TRUVADA 200-300 MG TABS (EMTRICITABINE-TENOFOVIR) once daily  #90 x 4   Entered and Authorized by:   Acey Lav MD   Signed by:   Paulette Blanch Dam MD on 12/02/2007   Method used:   Electronically to        [facility] [phone]* (retail)       [address]       [place], [place]  [address]       Ph: [phone]       Fax: [phone]   RxID:   [id number] ISENTRESS 400 MG TABS (RALTEGRAVIR POTASSIUM) Take 1 tablet by mouth two times a day  #180 x 4   Entered and Authorized by:   Acey Lav MD   Signed by:   Paulette Blanch Dam MD on 12/02/2007   Method used:   Electronically to        [facility] [phone]* (retail)       [address]       [place], [place]  [address]       Ph: [phone]       Fax: [phone]   RxID:   [id number] PREZISTA 400 MG TABS (DARUNAVIR ETHANOLATE) take two tablets once daily with norvir  #180 x 4   Entered and Authorized by:   Acey Lav MD   Signed by:   Paulette Blanch Dam MD on 12/02/2007   Method used:   Electronically to        [facility] [phone]* (retail)       [address]       [place], [place]  [address]       Ph: [phone]       Fax: [phone]   RxID:   [id number]

## 2010-02-24 NOTE — Miscellaneous (Signed)
  Clinical Lists Changes 

## 2010-02-24 NOTE — Progress Notes (Signed)
Summary: walk in request   Phone Note Call from Patient   Caller: Patient Details for Reason: Walk in Summary of Call: Pt walked into clinic c/o chest congestion with some r sided discomfort with coughing. prod cough  1 1/2 weeks, no fever Initial call taken by: Tomasita Morrow RN,  April 02, 2006 10:26 AM  New/Updated Medications: Sandria Senter ER 8-10 MG/5ML LQCR (CHLORPHENIRAMINE-HYDROCODONE) take 5 cc by mouth every 12 hours as needed for cough  New/Updated Medications: TUSSIONEX PENNKINETIC ER 8-10 MG/5ML LQCR (CHLORPHENIRAMINE-HYDROCODONE) take 5 cc by mouth every 12 hours as needed for cough  Prescriptions: TUSSIONEX PENNKINETIC ER 8-10 MG/5ML LQCR (CHLORPHENIRAMINE-HYDROCODONE) take 5 cc by mouth every 12 hours as needed for cough  #0 x 0   Entered by:   Tomasita Morrow RN   Authorized by:   Lenn Sink MD   Signed by:   Tomasita Morrow RN on 04/02/2006   Method used:   Telephoned to ...       [facility] [address]       [address]       [place], [place]  [address]       Ph: [phone]       Fax: [phone]   RxID:   [id number]

## 2010-02-24 NOTE — Assessment & Plan Note (Signed)
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)    Route: IM    Site: LUOQ gluteus    Exp Date: 09/2011    Lot #: [id number]    Mfr: king    Patient tolerated injection without complications    Given by: Starleen Arms CMA (March 02, 2009 4:01 PM)  Orders Added: 1)  Bicillin LA 1.2 million units Injection [J0570] 2)  Bicillin LA 1.2 million units Injection [J0570] 3)  Admin of Therapeutic Inj  intramuscular or subcutaneous [96372] 4)  Admin of Therapeutic Inj  intramuscular or subcutaneous [96372]   Medication Administration  Injection # 1:    Medication: Bicillin LA 1.2 million units Injection    Diagnosis: LATE SYPHILIS, LATENT (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)    Route: IM    Site: RUOQ gluteus    Exp Date: 09/2011    Lot #: [id number]    Mfr: king    Patient tolerated injection without complications    Given by: Starleen Arms CMA (March 02, 2009 4:00 PM)  Injection # 2:    Medication: Bicillin LA 1.2 million units Injection    Diagnosis: LATE SYPHILIS, 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

## 2010-02-24 NOTE — Miscellaneous (Signed)
Summary: RW - AIDS Years  Clinical Lists Changes  Observations: Added new observation of YEARAIDSPOS: 1993  (11/24/1991 14:28) Added new observation of HIV STATUS: CDC-defined AIDS  (11/24/1991 14:28)

## 2010-02-24 NOTE — Miscellaneous (Signed)
Summary: clinical update/ryan white  Clinical Lists Changes  Observations: Added new observation of AIDSDAP: Yes 2009 (05/13/2007 9:01)

## 2010-02-24 NOTE — Assessment & Plan Note (Signed)
Summary: BP CK ONLY/AN LABS/PER TAMIKA TO CK BP/VS   Chief Complaint:  bp check only.        Vital Signs:  Patient Profile:   55 Years Old Male Height:     73 inches (185.42 cm) BP sitting:   116 / 72  (left arm)  Pt. in pain?   no  Vitals Entered By: Starleen Arms (January 07, 2007 8:59 AM)                    Impression & Recommendations:  Problem # 1:  HYPERTENSION, BENIGN ESSENTIAL (ICD-401.1) Well controlled on current regimen. K 4.6 cr 1.22 tolerating ACEI His updated medication list for this problem includes:    Lisinopril 20 Mg Tabs (Lisinopril) .Marland Kitchen... Take 1 tablet by mouth once a day  Orders: T-Basic Metabolic Panel [phone])      ]

## 2010-04-07 LAB — T-HELPER CELL (CD4) - (RCID CLINIC ONLY)
CD4 % Helper T Cell: 25 % — ABNORMAL LOW (ref 33–55)
CD4 T Cell Abs: 1470 uL (ref 400–2700)

## 2010-04-15 LAB — T-HELPER CELL (CD4) - (RCID CLINIC ONLY)
CD4 % Helper T Cell: 23 % — ABNORMAL LOW (ref 33–55)
CD4 T Cell Abs: 1060 uL (ref 400–2700)

## 2010-04-20 ENCOUNTER — Other Ambulatory Visit: Payer: Medicare Other

## 2010-04-20 DIAGNOSIS — B20 Human immunodeficiency virus [HIV] disease: Secondary | ICD-10-CM

## 2010-04-20 LAB — COMPLETE METABOLIC PANEL WITH GFR
ALT: 30 U/L (ref 0–53)
AST: 31 U/L (ref 0–37)
Albumin: 4.5 g/dL (ref 3.5–5.2)
Alkaline Phosphatase: 63 U/L (ref 39–117)
BUN: 25 mg/dL — ABNORMAL HIGH (ref 6–23)
CO2: 21 mEq/L (ref 19–32)
Calcium: 9.6 mg/dL (ref 8.4–10.5)
Chloride: 108 mEq/L (ref 96–112)
Creat: 1.54 mg/dL — ABNORMAL HIGH (ref 0.40–1.50)
GFR, Est African American: 57 mL/min — ABNORMAL LOW (ref >60)
GFR, Est Non African American: 47 mL/min — ABNORMAL LOW (ref >60)
Glucose, Bld: 96 mg/dL (ref 70–99)
Potassium: 4.8 mEq/L (ref 3.5–5.3)
Sodium: 140 mEq/L (ref 135–145)
Total Bilirubin: 0.4 mg/dL (ref 0.3–1.2)
Total Protein: 7.4 g/dL (ref 6.0–8.3)

## 2010-04-20 LAB — CBC WITH DIFFERENTIAL/PLATELET
Basophils Absolute: 0.1 10*3/uL (ref 0.0–0.1)
Basophils Relative: 1 % (ref 0–1)
Eosinophils Absolute: 0.4 10*3/uL (ref 0.0–0.7)
Eosinophils Relative: 4 % (ref 0–5)
HCT: 37.2 % — ABNORMAL LOW (ref 39.0–52.0)
Hemoglobin: 12.9 g/dL — ABNORMAL LOW (ref 13.0–17.0)
Lymphocytes Relative: 46 % (ref 12–46)
Lymphs Abs: 4.1 10*3/uL — ABNORMAL HIGH (ref 0.7–4.0)
MCH: 32 pg (ref 26.0–34.0)
MCHC: 34.7 g/dL (ref 30.0–36.0)
MCV: 92.3 fL (ref 78.0–100.0)
Monocytes Absolute: 0.9 10*3/uL (ref 0.1–1.0)
Monocytes Relative: 10 % (ref 3–12)
Neutro Abs: 3.4 10*3/uL (ref 1.7–7.7)
Neutrophils Relative %: 39 % — ABNORMAL LOW (ref 43–77)
Platelets: 397 10*3/uL (ref 150–400)
RBC: 4.03 MIL/uL — ABNORMAL LOW (ref 4.22–5.81)
RDW: 14.5 % (ref 11.5–15.5)
WBC: 8.8 10*3/uL (ref 4.0–10.5)

## 2010-04-20 LAB — RPR TITER: RPR Titer: 1:2 {titer}

## 2010-04-20 LAB — LIPID PANEL
Cholesterol: 117 mg/dL (ref 0–200)
HDL: 28 mg/dL — ABNORMAL LOW (ref >39)
LDL Cholesterol: 68 mg/dL (ref 0–99)
Total CHOL/HDL Ratio: 4.2 Ratio
Triglycerides: 105 mg/dL (ref <150)
VLDL: 21 mg/dL (ref 0–40)

## 2010-04-20 LAB — RPR: RPR Ser Ql: REACTIVE — AB

## 2010-04-21 LAB — T-HELPER CELL (CD4) - (RCID CLINIC ONLY)
CD4 % Helper T Cell: 21 % — ABNORMAL LOW (ref 33–55)
CD4 T Cell Abs: 940 uL (ref 400–2700)

## 2010-04-21 LAB — T.PALLIDUM AB, TOTAL: T pallidum Antibodies (TP-PA): >8 S/CO — ABNORMAL HIGH (ref <0.90)

## 2010-04-22 LAB — HIV-1 RNA QUANT-NO REFLEX-BLD
HIV 1 RNA Quant: <20 copies/mL (ref <20)
HIV-1 RNA Quant, Log: <1.3 {Log} (ref <1.30)

## 2010-04-28 LAB — T-HELPER CELL (CD4) - (RCID CLINIC ONLY)
CD4 % Helper T Cell: 24 % — ABNORMAL LOW (ref 33–55)
CD4 T Cell Abs: 1020 uL (ref 400–2700)

## 2010-04-30 LAB — T-HELPER CELL (CD4) - (RCID CLINIC ONLY)
CD4 % Helper T Cell: 23 % — ABNORMAL LOW (ref 33–55)
CD4 T Cell Abs: 980 uL (ref 400–2700)

## 2010-05-03 LAB — T-HELPER CELL (CD4) - (RCID CLINIC ONLY)
CD4 % Helper T Cell: 24 % — ABNORMAL LOW (ref 33–55)
CD4 T Cell Abs: 1100 uL (ref 400–2700)

## 2010-05-04 ENCOUNTER — Ambulatory Visit (INDEPENDENT_AMBULATORY_CARE_PROVIDER_SITE_OTHER): Payer: No Typology Code available for payment source | Admitting: Infectious Disease

## 2010-05-04 ENCOUNTER — Encounter: Payer: Self-pay | Admitting: Infectious Disease

## 2010-05-04 VITALS — BP 134/89 | HR 56 | Temp 97.5°F | Ht 72.0 in | Wt 222.0 lb

## 2010-05-04 DIAGNOSIS — A528 Late syphilis, latent: Secondary | ICD-10-CM

## 2010-05-04 DIAGNOSIS — Z21 Asymptomatic human immunodeficiency virus [HIV] infection status: Secondary | ICD-10-CM

## 2010-05-04 DIAGNOSIS — E785 Hyperlipidemia, unspecified: Secondary | ICD-10-CM

## 2010-05-04 DIAGNOSIS — B20 Human immunodeficiency virus [HIV] disease: Secondary | ICD-10-CM

## 2010-05-04 MED ORDER — ETRAVIRINE 200 MG PO TABS: 200.0000 mg | ORAL_TABLET | Freq: Two times a day (BID) | ORAL | Status: DC

## 2010-05-04 NOTE — Assessment & Plan Note (Signed)
LDL at goal HDL is depressed you work on improving intake of fish and also increasing exercise

## 2010-05-04 NOTE — Progress Notes (Signed)
  Subjective:    Patient ID: Adam Becker, male    DOB: [birth year], 55 y.o.   MRN: [id number]  HPI  55 year old AA male with HIV very healthy CD4 count but who had persistent low grade viremia in the 100 to 400 range o intensified regimen to etravirine, ritonavir boosted darunavir and raltegravir. Marland KitchenHis cumulative genotype showed, M41L, D67N, K70R, L74V, V75A, T215Y, K219Q a M184V, making him I to Tenofovir, R to rest of NRTI, and as well as NNRTI mutation of G190E, G190Q, making him R to efavirens and nevirapene with low level activity of etravirine, Protease Gene: L10I, D30N, N88D with slight R to ATV. Since move to our new space his VL has become undetectable and his cd4 count has remained quite healthy well above 1000 He did have syphiis and titer had come up. He had normal WBC on LP, negative VDRL, normal protein but positive CSF FTA-Abs. I treated him for late syphilis with 3xim PCN. He continues to do quite well and has no specific complaints today.  Review of Systems As per history present also at 12 organisms is negative.    Objective:   Physical Exam [name] returns for previous as his ACT discloses some lipoatrophy changes in them and the face. Oropharynx clear without any lesions. Trivascular exam reveals a regular rate and rhythm no murmurs caps rubs heard lungs clear to auscultation bilaterally without wheezes or rales, his abdomen soft nondistended nontender slightly protuberant his extremities are without edema his neurological exam is nonfocal to       Assessment & Plan:  HIV INFECTION Excellent control. I will change his I. and intelence to two  200 mg tablets twice daily  LATE SYPHILIS, LATENT He was worked up for neurosyphilis and not found no evidence of neurosyphilis. I think his are low RPR titer is his serofast  date  HYPERLIPIDEMIA LDL at goal HDL is depressed you work on improving intake of fish and also increasing exercise

## 2010-05-04 NOTE — Assessment & Plan Note (Signed)
Excellent control. I will change his I. and intelence to two  200 mg tablets twice daily

## 2010-05-04 NOTE — Assessment & Plan Note (Signed)
He was worked up for neurosyphilis and not found no evidence of neurosyphilis. I think his are low RPR titer is his serofast  date

## 2010-05-04 NOTE — Patient Instructions (Signed)
GREAT JOB! RTC IN 6 MONTHS I HAVE CHANGED YOUR INTELENCE TO 200MG  TABLET TWICE DAILY REPLACLING THE TWO 100MG  TABLETS TWICE DAILY

## 2010-07-07 ENCOUNTER — Other Ambulatory Visit: Payer: Self-pay | Admitting: *Deleted

## 2010-07-07 DIAGNOSIS — F528 Other sexual dysfunction not due to a substance or known physiological condition: Secondary | ICD-10-CM

## 2010-07-07 MED ORDER — SILDENAFIL CITRATE 50 MG PO TABS: 25.0000 mg | ORAL_TABLET | ORAL | Status: DC | PRN

## 2010-10-10 ENCOUNTER — Other Ambulatory Visit: Payer: Self-pay | Admitting: *Deleted

## 2010-10-10 DIAGNOSIS — I10 Essential (primary) hypertension: Secondary | ICD-10-CM

## 2010-10-10 DIAGNOSIS — E7800 Pure hypercholesterolemia, unspecified: Secondary | ICD-10-CM

## 2010-10-10 MED ORDER — LISINOPRIL-HYDROCHLOROTHIAZIDE 20-12.5 MG PO TABS: 1.0000 | ORAL_TABLET | Freq: Every day | ORAL | Status: DC

## 2010-10-10 MED ORDER — PRAVASTATIN SODIUM 40 MG PO TABS: 40.0000 mg | ORAL_TABLET | Freq: Every day | ORAL | Status: DC

## 2010-10-20 LAB — T-HELPER CELL (CD4) - (RCID CLINIC ONLY)
CD4 % Helper T Cell: 18 — ABNORMAL LOW
CD4 T Cell Abs: 770

## 2010-10-24 LAB — T-HELPER CELL (CD4) - (RCID CLINIC ONLY)
CD4 % Helper T Cell: 24 — ABNORMAL LOW
CD4 T Cell Abs: 1180

## 2010-10-25 LAB — CSF CULTURE: Culture: NO GROWTH

## 2010-10-25 LAB — CSF CELL COUNT WITH DIFFERENTIAL
RBC Count, CSF: 304 — ABNORMAL HIGH
Tube #: 3
WBC, CSF: 1

## 2010-10-25 LAB — CSF CULTURE W GRAM STAIN

## 2010-10-25 LAB — PROTEIN AND GLUCOSE, CSF
Glucose, CSF: 51
Total  Protein, CSF: 40

## 2010-10-25 LAB — VDRL, CSF: VDRL Quant, CSF: NONREACTIVE

## 2010-10-25 LAB — MISCELLANEOUS TEST: Miscellaneous Test Results: REACTIVE

## 2010-10-28 LAB — T-HELPER CELL (CD4) - (RCID CLINIC ONLY)
CD4 % Helper T Cell: 23 % — ABNORMAL LOW (ref 33–55)
CD4 T Cell Abs: 910 uL (ref 400–2700)

## 2010-11-01 LAB — T-HELPER CELL (CD4) - (RCID CLINIC ONLY)
CD4 % Helper T Cell: 25 — ABNORMAL LOW
CD4 T Cell Abs: 670

## 2010-11-03 ENCOUNTER — Other Ambulatory Visit (INDEPENDENT_AMBULATORY_CARE_PROVIDER_SITE_OTHER): Payer: No Typology Code available for payment source

## 2010-11-03 ENCOUNTER — Other Ambulatory Visit: Payer: Self-pay | Admitting: Infectious Disease

## 2010-11-03 DIAGNOSIS — Z113 Encounter for screening for infections with a predominantly sexual mode of transmission: Secondary | ICD-10-CM

## 2010-11-03 DIAGNOSIS — B20 Human immunodeficiency virus [HIV] disease: Secondary | ICD-10-CM

## 2010-11-04 ENCOUNTER — Telehealth: Payer: Self-pay | Admitting: Licensed Clinical Social Worker

## 2010-11-04 DIAGNOSIS — B20 Human immunodeficiency virus [HIV] disease: Secondary | ICD-10-CM

## 2010-11-04 LAB — CBC WITH DIFFERENTIAL/PLATELET
Basophils Absolute: 0.1 10*3/uL (ref 0.0–0.1)
Basophils Relative: 1 % (ref 0–1)
Eosinophils Absolute: 0.4 10*3/uL (ref 0.0–0.7)
Eosinophils Relative: 6 % — ABNORMAL HIGH (ref 0–5)
HCT: 37.6 % — ABNORMAL LOW (ref 39.0–52.0)
Hemoglobin: 12.4 g/dL — ABNORMAL LOW (ref 13.0–17.0)
Lymphocytes Relative: 54 % — ABNORMAL HIGH (ref 12–46)
Lymphs Abs: 4.2 10*3/uL — ABNORMAL HIGH (ref 0.7–4.0)
MCH: 31.9 pg (ref 26.0–34.0)
MCHC: 33 g/dL (ref 30.0–36.0)
MCV: 96.7 fL (ref 78.0–100.0)
Monocytes Absolute: 0.7 10*3/uL (ref 0.1–1.0)
Monocytes Relative: 9 % (ref 3–12)
Neutro Abs: 2.4 10*3/uL (ref 1.7–7.7)
Neutrophils Relative %: 31 % — ABNORMAL LOW (ref 43–77)
Platelets: 360 10*3/uL (ref 150–400)
RBC: 3.89 MIL/uL — ABNORMAL LOW (ref 4.22–5.81)
RDW: 14.5 % (ref 11.5–15.5)
WBC: 7.8 10*3/uL (ref 4.0–10.5)

## 2010-11-04 LAB — COMPREHENSIVE METABOLIC PANEL
ALT: 27 U/L (ref 0–53)
AST: 26 U/L (ref 0–37)
Albumin: 4.6 g/dL (ref 3.5–5.2)
Alkaline Phosphatase: 49 U/L (ref 39–117)
BUN: 25 mg/dL — ABNORMAL HIGH (ref 6–23)
CO2: 24 mEq/L (ref 19–32)
Calcium: 9.4 mg/dL (ref 8.4–10.5)
Chloride: 106 mEq/L (ref 96–112)
Creat: 1.45 mg/dL — ABNORMAL HIGH (ref 0.50–1.35)
Glucose, Bld: 79 mg/dL (ref 70–99)
Potassium: 4.8 mEq/L (ref 3.5–5.3)
Sodium: 141 mEq/L (ref 135–145)
Total Bilirubin: 0.3 mg/dL (ref 0.3–1.2)
Total Protein: 7.6 g/dL (ref 6.0–8.3)

## 2010-11-04 LAB — RPR: RPR Ser Ql: REACTIVE — AB

## 2010-11-04 LAB — T-HELPER CELL (CD4) - (RCID CLINIC ONLY)
CD4 % Helper T Cell: 22 — ABNORMAL LOW
CD4 % Helper T Cell: 28 % — ABNORMAL LOW (ref 33–55)
CD4 T Cell Abs: 790
CD4 T Cell Abs: 1210 uL (ref 400–2700)

## 2010-11-04 LAB — T.PALLIDUM AB, TOTAL: T pallidum Antibodies (TP-PA): >8 S/CO — ABNORMAL HIGH (ref <0.90)

## 2010-11-04 LAB — RPR TITER: RPR Titer: 1:2 {titer}

## 2010-11-04 MED ORDER — RITONAVIR 100 MG PO CAPS: 100.0000 mg | ORAL_CAPSULE | Freq: Every day | ORAL | Status: DC

## 2010-11-04 NOTE — Telephone Encounter (Signed)
rx

## 2010-11-07 LAB — HIV-1 RNA QUANT-NO REFLEX-BLD
HIV 1 RNA Quant: 29 copies/mL — ABNORMAL HIGH (ref <20)
HIV-1 RNA Quant, Log: 1.46 {Log} — ABNORMAL HIGH (ref <1.30)

## 2010-11-15 ENCOUNTER — Ambulatory Visit (INDEPENDENT_AMBULATORY_CARE_PROVIDER_SITE_OTHER): Payer: No Typology Code available for payment source | Admitting: Infectious Disease

## 2010-11-15 ENCOUNTER — Encounter: Payer: Self-pay | Admitting: Infectious Disease

## 2010-11-15 VITALS — BP 124/83 | HR 63 | Temp 97.7°F | Wt 219.0 lb

## 2010-11-15 DIAGNOSIS — B20 Human immunodeficiency virus [HIV] disease: Secondary | ICD-10-CM

## 2010-11-15 DIAGNOSIS — Z21 Asymptomatic human immunodeficiency virus [HIV] infection status: Secondary | ICD-10-CM

## 2010-11-15 DIAGNOSIS — A528 Late syphilis, latent: Secondary | ICD-10-CM

## 2010-11-15 DIAGNOSIS — E785 Hyperlipidemia, unspecified: Secondary | ICD-10-CM

## 2010-11-15 DIAGNOSIS — Z23 Encounter for immunization: Secondary | ICD-10-CM

## 2010-11-15 MED ORDER — ETRAVIRINE 200 MG PO TABS: 200.0000 mg | ORAL_TABLET | Freq: Two times a day (BID) | ORAL | Status: DC

## 2010-11-15 MED ORDER — RITONAVIR 100 MG PO TABS: 100.0000 mg | ORAL_TABLET | Freq: Every day | ORAL | Status: DC

## 2010-11-15 MED ORDER — DARUNAVIR ETHANOLATE 400 MG PO TABS: 800.0000 mg | ORAL_TABLET | Freq: Every day | ORAL | Status: DC

## 2010-11-16 NOTE — Assessment & Plan Note (Signed)
Sp rx, follow RPR

## 2010-11-16 NOTE — Assessment & Plan Note (Signed)
pravachol

## 2010-11-16 NOTE — Assessment & Plan Note (Signed)
Superb control with twice daily intelence, isentress and boosted prezista

## 2010-11-16 NOTE — Progress Notes (Signed)
  Subjective:    Patient ID: Adam Becker, male    DOB: 1955-08-09, 55 y.o.   MRN: [id number]  HPI  Adam Becker is a 55 y.o. African American male who is doing superbly well on their antiviral salvage regimen, with undetectable viral load and health cd4 count. He is doing very well and withotu specific complaints.   Review of Systems  Constitutional: Negative for fever, chills, diaphoresis, activity change, appetite change, fatigue and unexpected weight change.  HENT: Negative for congestion, sore throat, rhinorrhea, sneezing, trouble swallowing and sinus pressure.   Eyes: Negative for photophobia and visual disturbance.  Respiratory: Negative for cough, chest tightness, shortness of breath, wheezing and stridor.   Cardiovascular: Negative for chest pain, palpitations and leg swelling.  Gastrointestinal: Negative for nausea, vomiting, abdominal pain, diarrhea, constipation, blood in stool, abdominal distention and anal bleeding.  Genitourinary: Negative for dysuria, hematuria, flank pain and difficulty urinating.  Musculoskeletal: Negative for myalgias, back pain, joint swelling, arthralgias and gait problem.  Skin: Negative for color change, pallor, rash and wound.  Neurological: Negative for dizziness, tremors, weakness and light-headedness.  Hematological: Negative for adenopathy. Does not bruise/bleed easily.  Psychiatric/Behavioral: Negative for behavioral problems, confusion, sleep disturbance, dysphoric mood, decreased concentration and agitation.       Objective:   Physical Exam  Constitutional: He is oriented to person, place, and time. He appears well-developed and well-nourished. No distress.  HENT:  Head: Normocephalic and atraumatic.  Mouth/Throat: Oropharynx is clear and moist. No oropharyngeal exudate.  Eyes: Conjunctivae and EOM are normal. Pupils are equal, round, and reactive to light. No scleral icterus.  Neck: Normal range of motion. Neck supple. No JVD  present.  Cardiovascular: Normal rate, regular rhythm and normal heart sounds.  Exam reveals no gallop and no friction rub.   No murmur heard. Pulmonary/Chest: Effort normal and breath sounds normal. No respiratory distress. He has no wheezes. He has no rales. He exhibits no tenderness.  Abdominal: He exhibits no distension and no mass. There is no tenderness. There is no rebound and no guarding.  Musculoskeletal: He exhibits no edema and no tenderness.  Lymphadenopathy:    He has no cervical adenopathy.  Neurological: He is alert and oriented to person, place, and time. He has normal reflexes. He exhibits normal muscle tone. Coordination normal.  Skin: Skin is warm and dry. He is not diaphoretic. No erythema. No pallor.  Psychiatric: He has a normal mood and affect. His behavior is normal. Judgment and thought content normal.          Assessment & Plan:  HIV INFECTION Superb control with twice daily intelence, isentress and boosted prezista  LATE SYPHILIS, LATENT Sp rx, follow RPR  HYPERLIPIDEMIA pravachol

## 2010-12-07 ENCOUNTER — Other Ambulatory Visit: Payer: Self-pay | Admitting: Licensed Clinical Social Worker

## 2010-12-07 DIAGNOSIS — B20 Human immunodeficiency virus [HIV] disease: Secondary | ICD-10-CM

## 2010-12-07 MED ORDER — RALTEGRAVIR POTASSIUM 400 MG PO TABS: 400.0000 mg | ORAL_TABLET | Freq: Two times a day (BID) | ORAL | Status: DC

## 2010-12-07 MED ORDER — DARUNAVIR ETHANOLATE 400 MG PO TABS: 800.0000 mg | ORAL_TABLET | Freq: Every day | ORAL | Status: DC

## 2010-12-14 ENCOUNTER — Other Ambulatory Visit: Payer: Self-pay | Admitting: Licensed Clinical Social Worker

## 2010-12-14 DIAGNOSIS — B20 Human immunodeficiency virus [HIV] disease: Secondary | ICD-10-CM

## 2010-12-14 MED ORDER — RITONAVIR 100 MG PO TABS: 100.0000 mg | ORAL_TABLET | Freq: Every day | ORAL | Status: DC

## 2011-01-10 ENCOUNTER — Other Ambulatory Visit: Payer: Self-pay | Admitting: *Deleted

## 2011-01-10 DIAGNOSIS — B20 Human immunodeficiency virus [HIV] disease: Secondary | ICD-10-CM

## 2011-01-10 MED ORDER — RALTEGRAVIR POTASSIUM 400 MG PO TABS: 400.0000 mg | ORAL_TABLET | Freq: Two times a day (BID) | ORAL | Status: DC

## 2011-01-12 ENCOUNTER — Other Ambulatory Visit: Payer: Self-pay | Admitting: *Deleted

## 2011-01-12 DIAGNOSIS — B20 Human immunodeficiency virus [HIV] disease: Secondary | ICD-10-CM

## 2011-01-12 MED ORDER — ETRAVIRINE 100 MG PO TABS: 200.0000 mg | ORAL_TABLET | Freq: Two times a day (BID) | ORAL | Status: DC

## 2011-01-12 NOTE — Telephone Encounter (Signed)
[facility]  Intelence [facility]

## 2011-01-13 ENCOUNTER — Other Ambulatory Visit: Payer: Self-pay | Admitting: *Deleted

## 2011-01-13 DIAGNOSIS — B20 Human immunodeficiency virus [HIV] disease: Secondary | ICD-10-CM

## 2011-01-13 MED ORDER — DARUNAVIR ETHANOLATE 400 MG PO TABS: 800.0000 mg | ORAL_TABLET | Freq: Every day | ORAL | Status: DC

## 2011-05-22 ENCOUNTER — Other Ambulatory Visit: Payer: Self-pay | Admitting: Licensed Clinical Social Worker

## 2011-05-22 DIAGNOSIS — I10 Essential (primary) hypertension: Secondary | ICD-10-CM

## 2011-05-22 DIAGNOSIS — E7800 Pure hypercholesterolemia, unspecified: Secondary | ICD-10-CM

## 2011-05-22 MED ORDER — PRAVASTATIN SODIUM 40 MG PO TABS: 40.0000 mg | ORAL_TABLET | Freq: Every day | ORAL | Status: DC

## 2011-05-22 MED ORDER — LISINOPRIL-HYDROCHLOROTHIAZIDE 20-12.5 MG PO TABS: 1.0000 | ORAL_TABLET | Freq: Every day | ORAL | Status: DC

## 2011-06-07 ENCOUNTER — Other Ambulatory Visit (HOSPITAL_COMMUNITY)
Admission: RE | Admit: 2011-06-07 | Discharge: 2011-06-07 | Disposition: A | Discharge status: Home / Self Care | Payer: No Typology Code available for payment source | Source: Ambulatory Visit | Attending: Infectious Disease | Admitting: Infectious Disease

## 2011-06-07 ENCOUNTER — Other Ambulatory Visit: Payer: No Typology Code available for payment source

## 2011-06-07 ENCOUNTER — Other Ambulatory Visit: Payer: Self-pay | Admitting: *Deleted

## 2011-06-07 DIAGNOSIS — Z113 Encounter for screening for infections with a predominantly sexual mode of transmission: Secondary | ICD-10-CM

## 2011-06-07 DIAGNOSIS — B20 Human immunodeficiency virus [HIV] disease: Secondary | ICD-10-CM

## 2011-06-08 LAB — CBC WITH DIFFERENTIAL/PLATELET
Basophils Absolute: 0.1 10*3/uL (ref 0.0–0.1)
Basophils Relative: 1 % (ref 0–1)
Eosinophils Absolute: 0.4 10*3/uL (ref 0.0–0.7)
Eosinophils Relative: 4 % (ref 0–5)
HCT: 36.5 % — ABNORMAL LOW (ref 39.0–52.0)
Hemoglobin: 12.1 g/dL — ABNORMAL LOW (ref 13.0–17.0)
Lymphocytes Relative: 57 % — ABNORMAL HIGH (ref 12–46)
Lymphs Abs: 4.6 10*3/uL — ABNORMAL HIGH (ref 0.7–4.0)
MCH: 31 pg (ref 26.0–34.0)
MCHC: 33.2 g/dL (ref 30.0–36.0)
MCV: 93.6 fL (ref 78.0–100.0)
Monocytes Absolute: 0.6 10*3/uL (ref 0.1–1.0)
Monocytes Relative: 7 % (ref 3–12)
Neutro Abs: 2.6 10*3/uL (ref 1.7–7.7)
Neutrophils Relative %: 31 % — ABNORMAL LOW (ref 43–77)
Platelets: 425 10*3/uL — ABNORMAL HIGH (ref 150–400)
RBC: 3.9 MIL/uL — ABNORMAL LOW (ref 4.22–5.81)
RDW: 14.5 % (ref 11.5–15.5)
WBC: 8.2 10*3/uL (ref 4.0–10.5)

## 2011-06-08 LAB — COMPLETE METABOLIC PANEL WITH GFR
ALT: 26 U/L (ref 0–53)
AST: 31 U/L (ref 0–37)
Albumin: 4 g/dL (ref 3.5–5.2)
Alkaline Phosphatase: 50 U/L (ref 39–117)
BUN: 22 mg/dL (ref 6–23)
CO2: 27 mEq/L (ref 19–32)
Calcium: 9.5 mg/dL (ref 8.4–10.5)
Chloride: 103 mEq/L (ref 96–112)
Creat: 1.73 mg/dL — ABNORMAL HIGH (ref 0.50–1.35)
GFR, Est African American: 51 mL/min — ABNORMAL LOW
GFR, Est Non African American: 44 mL/min — ABNORMAL LOW
Glucose, Bld: 88 mg/dL (ref 70–99)
Potassium: 4.2 mEq/L (ref 3.5–5.3)
Sodium: 138 mEq/L (ref 135–145)
Total Bilirubin: 0.3 mg/dL (ref 0.3–1.2)
Total Protein: 7.5 g/dL (ref 6.0–8.3)

## 2011-06-08 LAB — T-HELPER CELL (CD4) - (RCID CLINIC ONLY)
CD4 % Helper T Cell: 26 % — ABNORMAL LOW (ref 33–55)
CD4 T Cell Abs: 1150 uL (ref 400–2700)

## 2011-06-08 LAB — RPR TITER: RPR Titer: 1:64 {titer} — AB

## 2011-06-08 LAB — LIPID PANEL
Cholesterol: 146 mg/dL (ref 0–200)
HDL: 38 mg/dL — ABNORMAL LOW (ref >39)
LDL Cholesterol: 81 mg/dL (ref 0–99)
Total CHOL/HDL Ratio: 3.8 Ratio
Triglycerides: 134 mg/dL (ref <150)
VLDL: 27 mg/dL (ref 0–40)

## 2011-06-08 LAB — HIV-1 RNA QUANT-NO REFLEX-BLD
HIV 1 RNA Quant: 25 copies/mL — ABNORMAL HIGH (ref <20)
HIV-1 RNA Quant, Log: 1.4 {Log} — ABNORMAL HIGH (ref <1.30)

## 2011-06-08 LAB — RPR: RPR Ser Ql: REACTIVE — AB

## 2011-06-09 ENCOUNTER — Telehealth: Payer: Self-pay | Admitting: Infectious Disease

## 2011-06-09 LAB — T.PALLIDUM AB, TOTAL: T pallidum Antibodies (TP-PA): 6.4 S/CO — ABNORMAL HIGH (ref <0.90)

## 2011-06-09 NOTE — Telephone Encounter (Signed)
Tamika can you call Adam Becker and find out if he has been having unprotected sex or not? If he hasnt we need to go down road of ruling out Neurosyphilis. IF he has been UNSAFE then I am OK to repeat 3 IM PCN shots again

## 2011-06-12 ENCOUNTER — Ambulatory Visit (INDEPENDENT_AMBULATORY_CARE_PROVIDER_SITE_OTHER): Payer: No Typology Code available for payment source | Admitting: *Deleted

## 2011-06-12 DIAGNOSIS — A539 Syphilis, unspecified: Secondary | ICD-10-CM

## 2011-06-12 MED ORDER — PENICILLIN G BENZATHINE 1200000 UNIT/2ML IM SUSP
2.4000 10*6.[IU] | Freq: Once | INTRAMUSCULAR | Status: AC
  Administered 2011-06-12: 2.4 10*6.[IU] via INTRAMUSCULAR

## 2011-06-21 ENCOUNTER — Ambulatory Visit (INDEPENDENT_AMBULATORY_CARE_PROVIDER_SITE_OTHER): Payer: No Typology Code available for payment source | Admitting: Infectious Disease

## 2011-06-21 ENCOUNTER — Encounter: Payer: Self-pay | Admitting: Infectious Disease

## 2011-06-21 VITALS — BP 128/81 | HR 87 | Temp 97.4°F | Ht 72.0 in | Wt 222.0 lb

## 2011-06-21 DIAGNOSIS — N259 Disorder resulting from impaired renal tubular function, unspecified: Secondary | ICD-10-CM

## 2011-06-21 DIAGNOSIS — Z0000 Encounter for general adult medical examination without abnormal findings: Secondary | ICD-10-CM

## 2011-06-21 DIAGNOSIS — B20 Human immunodeficiency virus [HIV] disease: Secondary | ICD-10-CM

## 2011-06-21 DIAGNOSIS — A539 Syphilis, unspecified: Secondary | ICD-10-CM

## 2011-06-21 DIAGNOSIS — A528 Late syphilis, latent: Secondary | ICD-10-CM

## 2011-06-21 MED ORDER — PENICILLIN G BENZATHINE 1200000 UNIT/2ML IM SUSP
1.2000 10*6.[IU] | Freq: Once | INTRAMUSCULAR | Status: AC
  Administered 2011-06-21: 1.2 10*6.[IU] via INTRAMUSCULAR

## 2011-06-21 MED ORDER — ETRAVIRINE 200 MG PO TABS: 400.0000 mg | ORAL_TABLET | Freq: Every day | ORAL | Status: DC

## 2011-06-21 MED ORDER — PNEUMOCOCCAL 13-VAL CONJ VACC IM SUSP: 0.5000 mL | INTRAMUSCULAR | Status: AC

## 2011-06-21 NOTE — Assessment & Plan Note (Signed)
Perfect control 

## 2011-06-21 NOTE — Progress Notes (Signed)
  Subjective:    Patient ID: Adam Becker, male    DOB: 03/19/1955, 56 y.o.   MRN: [id number]  HPI  56 year old man with HIV doing Very well on antiviral salvage regimen, of ISENTRESS, INTELENCE AND NORVIR BOOSTED PREZISTA with undetectable viral load and health cd4 count. 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 He states that condoms broke on several occasions with intercourse. He has had two sexual partners since we last saw him this fall. He is otherwise doing well. He has had PCN IM x2 so far. I have emphasized utmost importance of using condoms with partners and knowing status of partners HIV, syphilis and other STDs.    Review of Systems  Constitutional: Negative for fever, chills, diaphoresis, activity change, appetite change, fatigue and unexpected weight change.  HENT: Negative for congestion, sore throat, rhinorrhea, sneezing, trouble swallowing and sinus pressure.   Eyes: Negative for photophobia and visual disturbance.  Respiratory: Negative for cough, chest tightness, shortness of breath, wheezing and stridor.   Cardiovascular: Negative for chest pain, palpitations and leg swelling.  Gastrointestinal: Negative for nausea, vomiting, abdominal pain, diarrhea, constipation, blood in stool, abdominal distention and anal bleeding.  Genitourinary: Negative for dysuria, hematuria, flank pain and difficulty urinating.  Musculoskeletal: Negative for myalgias, back pain, joint swelling, arthralgias and gait problem.  Skin: Negative for color change, pallor, rash and wound.  Neurological: Negative for dizziness, tremors, weakness and light-headedness.  Hematological: Negative for adenopathy. Does not bruise/bleed easily.  Psychiatric/Behavioral: Negative for behavioral problems, confusion, sleep disturbance, dysphoric mood, decreased concentration and agitation.       Objective:   Physical Exam  Constitutional: He is oriented to person, place, and time. He  appears well-developed and well-nourished. No distress.  HENT:  Head: Normocephalic and atraumatic.  Mouth/Throat: Oropharynx is clear and moist. No oropharyngeal exudate.  Eyes: Conjunctivae and EOM are normal. Pupils are equal, round, and reactive to light. No scleral icterus.  Neck: Normal range of motion. Neck supple. No JVD present.  Cardiovascular: Normal rate, regular rhythm and normal heart sounds.  Exam reveals no gallop and no friction rub.   No murmur heard. Pulmonary/Chest: Effort normal and breath sounds normal. No respiratory distress. He has no wheezes. He has no rales. He exhibits no tenderness.  Abdominal: He exhibits no distension and no mass. There is no tenderness. There is no rebound and no guarding.  Musculoskeletal: He exhibits no edema and no tenderness.  Lymphadenopathy:    He has no cervical adenopathy.  Neurological: He is alert and oriented to person, place, and time. He has normal reflexes. He exhibits normal muscle tone. Coordination normal.  Skin: Skin is warm and dry. He is not diaphoretic. No erythema. No pallor.  Psychiatric: He has a normal mood and affect. His behavior is normal. Judgment and thought content normal.          Assessment & Plan:  HIV INFECTION Perfect control!  LATE SYPHILIS, LATENT Treating again with IM PCN.  IMPAIRED RENAL FUNCTION NOS Creatinine a little worse. He is not on any nephrotoxic ARVs

## 2011-06-21 NOTE — Assessment & Plan Note (Signed)
Creatinine a little worse. He is not on any nephrotoxic ARVs

## 2011-06-21 NOTE — Assessment & Plan Note (Signed)
Treating again with IM PCN.

## 2011-06-28 ENCOUNTER — Ambulatory Visit (INDEPENDENT_AMBULATORY_CARE_PROVIDER_SITE_OTHER): Payer: No Typology Code available for payment source | Admitting: *Deleted

## 2011-06-28 DIAGNOSIS — A539 Syphilis, unspecified: Secondary | ICD-10-CM

## 2011-06-28 MED ORDER — PENICILLIN G BENZATHINE 1200000 UNIT/2ML IM SUSP
1.2000 10*6.[IU] | Freq: Once | INTRAMUSCULAR | Status: AC
  Administered 2011-06-28: 1.2 10*6.[IU] via INTRAMUSCULAR

## 2011-06-28 NOTE — Progress Notes (Signed)
I gave him extra large condoms & suggested he use lube to prevent breakage to avoid stds. Tolerated injections well

## 2011-11-20 ENCOUNTER — Telehealth: Payer: Self-pay | Admitting: *Deleted

## 2011-11-20 NOTE — Telephone Encounter (Signed)
Patient c/o vomiting and diarrhea x 3 days, no PCP, given appt for tomorrow with Dr. Orvan Falconer. Wendall Mola CMA

## 2011-11-21 ENCOUNTER — Ambulatory Visit (INDEPENDENT_AMBULATORY_CARE_PROVIDER_SITE_OTHER): Payer: No Typology Code available for payment source | Admitting: Internal Medicine

## 2011-11-21 VITALS — BP 103/69 | HR 92 | Temp 97.6°F | Ht 72.0 in | Wt 213.5 lb

## 2011-11-21 DIAGNOSIS — K529 Noninfective gastroenteritis and colitis, unspecified: Secondary | ICD-10-CM

## 2011-11-21 DIAGNOSIS — B20 Human immunodeficiency virus [HIV] disease: Secondary | ICD-10-CM

## 2011-11-21 DIAGNOSIS — K5289 Other specified noninfective gastroenteritis and colitis: Secondary | ICD-10-CM

## 2011-11-21 LAB — CBC
HCT: 42.2 % (ref 39.0–52.0)
Hemoglobin: 14.5 g/dL (ref 13.0–17.0)
MCH: 31.9 pg (ref 26.0–34.0)
MCHC: 34.4 g/dL (ref 30.0–36.0)
MCV: 92.7 fL (ref 78.0–100.0)
Platelets: 361 10*3/uL (ref 150–400)
RBC: 4.55 MIL/uL (ref 4.22–5.81)
RDW: 13.4 % (ref 11.5–15.5)
WBC: 7.4 10*3/uL (ref 4.0–10.5)

## 2011-11-21 MED ORDER — PROMETHAZINE HCL 12.5 MG PO TABS: 12.5000 mg | ORAL_TABLET | Freq: Four times a day (QID) | ORAL | Status: DC | PRN

## 2011-11-21 NOTE — Progress Notes (Signed)
Patient ID: Adam Becker, male   DOB: 01-01-1956, 55 y.o.   MRN: [id number]     [facility] for Infectious Disease  Patient Active Problem List  Diagnosis  . HIV INFECTION  . NEUROSYPHILIS  . LATE SYPHILIS, LATENT  . HYPERLIPIDEMIA  . ERECTILE DYSFUNCTION, MILD  . HYPERTENSION, BENIGN ESSENTIAL  . IMPAIRED RENAL FUNCTION NOS  . PARESTHESIA, HANDS  . Acute gastroenteritis    Patient's Medications  New Prescriptions   PROMETHAZINE (PHENERGAN) 12.5 MG TABLET    Take 1 tablet (12.5 mg total) by mouth every 6 (six) hours as needed for nausea.  Previous Medications   DARUNAVIR (PREZISTA) 400 MG TABLET    Take 2 tablets (800 mg total) by mouth daily. With Norvir   ETRAVIRINE (INTELENCE) 200 MG TABS    Take 400 mg by mouth daily.   LISINOPRIL-HYDROCHLOROTHIAZIDE (PRINZIDE,ZESTORETIC) 20-12.5 MG PER TABLET    Take 1 tablet by mouth daily.   PRAVASTATIN (PRAVACHOL) 40 MG TABLET    Take 1 tablet (40 mg total) by mouth daily.   RALTEGRAVIR (ISENTRESS) 400 MG TABLET    Take 1 tablet (400 mg total) by mouth 2 (two) times daily.   RITONAVIR (NORVIR) 100 MG TABS    Take 1 tablet (100 mg total) by mouth daily.   SILDENAFIL (VIAGRA) 50 MG TABLET    Take 0.5 tablets (25 mg total) by mouth as needed. Take half of tablet by mouth 1 hour before sexual activity.  Modified Medications   No medications on file  Discontinued Medications   No medications on file    Subjective: Adam Becker is seen on a work in basis. Five days ago he developed frequent watery diarrhea. The following day he also began to have some nausea and intermittent vomiting. He is not aware of any fever, chills or sweats. The diarrhea came about every hour for the first 72 hours and seems to have improved. He had one loose stool about 7:30 AM today. His nausea and vomiting have also improved. He tried eating some chicken last night but felt nauseous and stopped. He was able to eats some biscuits this morning without nausea or  vomiting. He has been able to keep down all of his medications and does not believe he is missed a single dose.  Objective: Temp: 97.6 F (36.4 C) (10/29 1336) Temp src: Oral (10/29 1336) BP: 103/69 mmHg (10/29 1336) Pulse Rate: 92  (10/29 1336)  General: He is alert and in no distress Oral: Moist mucous membranes with no lesions Skin: No rash Lungs: Clear Cor: Regular S1 and S2 and no murmurs Abdomen: Soft and nontender with normal bowel sounds  Lab Results HIV 1 RNA Quant (copies/mL)  Date Value  06/07/2011 25*  11/03/2010 29*  04/20/2010 <20      CD4 T Cell Abs (cmm)  Date Value  06/07/2011 1150   11/03/2010 1210   04/20/2010 940      Assessment: He probably has acute viral gastroenteritis that is resolving spontaneously. I will give him some low-dose Phenergan to take as needed and suggested that he stick with clear liquids, crackers and toast for the next 24 hours before advancing his diet. If severe diarrhea returns I asked him to give [place] a call so we can obtain appropriate stool specimens.  His adherence to his antiretroviral regimen appears to be very good. I will continue his current regimen and repeat lab work today in anticipation of his followup visit with Dr. Daiva Eves next  month.  I will repeat an RPR to check on his response to therapy for syphilis earlier this year.  Plan: 1. Phenergan as needed 2. Advance diet slowly 3. Continue current medications 4. Check lab work today in followup next month   Cliffton Asters, MD [facility] for Infectious Disease [facility] [phone] pager   [phone] cell 11/21/2011, 1:51 PM

## 2011-11-22 ENCOUNTER — Telehealth: Payer: Self-pay | Admitting: *Deleted

## 2011-11-22 ENCOUNTER — Other Ambulatory Visit: Payer: No Typology Code available for payment source

## 2011-11-22 DIAGNOSIS — R197 Diarrhea, unspecified: Secondary | ICD-10-CM

## 2011-11-22 LAB — COMPREHENSIVE METABOLIC PANEL
ALT: 29 U/L (ref 0–53)
AST: 32 U/L (ref 0–37)
Albumin: 4.3 g/dL (ref 3.5–5.2)
Alkaline Phosphatase: 58 U/L (ref 39–117)
BUN: 95 mg/dL — ABNORMAL HIGH (ref 6–23)
CO2: 16 mEq/L — ABNORMAL LOW (ref 19–32)
Calcium: 8.4 mg/dL (ref 8.4–10.5)
Chloride: 98 mEq/L (ref 96–112)
Creat: 6.91 mg/dL — ABNORMAL HIGH (ref 0.50–1.35)
Glucose, Bld: 89 mg/dL (ref 70–99)
Potassium: 4.3 mEq/L (ref 3.5–5.3)
Sodium: 132 mEq/L — ABNORMAL LOW (ref 135–145)
Total Bilirubin: 0.3 mg/dL (ref 0.3–1.2)
Total Protein: 8.4 g/dL — ABNORMAL HIGH (ref 6.0–8.3)

## 2011-11-22 LAB — RPR TITER: RPR Titer: 1:8 {titer}

## 2011-11-22 LAB — T-HELPER CELL (CD4) - (RCID CLINIC ONLY)
CD4 % Helper T Cell: 27 % — ABNORMAL LOW (ref 33–55)
CD4 T Cell Abs: 1010 uL (ref 400–2700)

## 2011-11-22 LAB — HIV-1 RNA QUANT-NO REFLEX-BLD
HIV 1 RNA Quant: <20 copies/mL (ref <20)
HIV-1 RNA Quant, Log: <1.3 {Log} (ref <1.30)

## 2011-11-22 LAB — RPR: RPR Ser Ql: REACTIVE — AB

## 2011-11-22 LAB — T.PALLIDUM AB, TOTAL: T pallidum Antibodies (TP-PA): >8 S/CO — ABNORMAL HIGH (ref <0.90)

## 2011-11-22 NOTE — Telephone Encounter (Signed)
RN requested that pt try to arrange transportation to [facility] to provide Stool Specimen per Dr. Blair Dolphin note from 11/21/11.  Pt to return call after finding transportation. Telephone order from Dr. Orvan Falconer.  Stool culture and sensitivity, C. Diff by PCR.

## 2011-11-23 LAB — CLOSTRIDIUM DIFFICILE BY PCR: Toxigenic C. Difficile by PCR: NOT DETECTED

## 2011-11-24 ENCOUNTER — Telehealth: Payer: Self-pay | Admitting: *Deleted

## 2011-11-24 DIAGNOSIS — N189 Chronic kidney disease, unspecified: Secondary | ICD-10-CM

## 2011-11-24 HISTORY — DX: Chronic kidney disease, unspecified: N18.9

## 2011-11-24 NOTE — Telephone Encounter (Signed)
Pt has tried Immodium and Kayopectate without success.  C.Diff was negative.  Culture results not complete.  RN will call pt on Monday, Nov. 4, 2013.

## 2011-11-26 ENCOUNTER — Inpatient Hospital Stay: Payer: Self-pay | Admitting: Internal Medicine

## 2011-11-26 LAB — CBC
HCT: 41.2 % (ref 40.0–52.0)
HGB: 14.4 g/dL (ref 13.0–18.0)
MCH: 32.2 pg (ref 26.0–34.0)
MCHC: 34.9 g/dL (ref 32.0–36.0)
MCV: 92 fL (ref 80–100)
Platelet: 411 10*3/uL (ref 150–440)
RBC: 4.47 10*6/uL (ref 4.40–5.90)
RDW: 13.5 % (ref 11.5–14.5)
WBC: 11.2 10*3/uL — ABNORMAL HIGH (ref 3.8–10.6)

## 2011-11-26 LAB — CK TOTAL AND CKMB (NOT AT ARMC)
CK, Total: 224 U/L (ref 35–232)
CK-MB: 2.7 ng/mL (ref 0.5–3.6)

## 2011-11-26 LAB — COMPREHENSIVE METABOLIC PANEL
Albumin: 3.9 g/dL (ref 3.4–5.0)
Alkaline Phosphatase: 82 U/L (ref 50–136)
Anion Gap: 13 (ref 7–16)
BUN: 159 mg/dL — ABNORMAL HIGH (ref 7–18)
Bilirubin,Total: 0.3 mg/dL (ref 0.2–1.0)
Calcium, Total: 8.9 mg/dL (ref 8.5–10.1)
Chloride: 109 mmol/L — ABNORMAL HIGH (ref 98–107)
Co2: 9 mmol/L — CL (ref 21–32)
Creatinine: 11.2 mg/dL — ABNORMAL HIGH (ref 0.60–1.30)
EGFR (African American): 5 — ABNORMAL LOW
EGFR (Non-African Amer.): 5 — ABNORMAL LOW
Glucose: 104 mg/dL — ABNORMAL HIGH (ref 65–99)
Osmolality: 315 (ref 275–301)
Potassium: 4.3 mmol/L (ref 3.5–5.1)
SGOT(AST): 37 U/L (ref 15–37)
SGPT (ALT): 34 U/L (ref 12–78)
Sodium: 131 mmol/L — ABNORMAL LOW (ref 136–145)
Total Protein: 9.6 g/dL — ABNORMAL HIGH (ref 6.4–8.2)

## 2011-11-26 LAB — BASIC METABOLIC PANEL
Anion Gap: 8 (ref 7–16)
BUN: 153 mg/dL — ABNORMAL HIGH (ref 7–18)
Calcium, Total: 8 mg/dL — ABNORMAL LOW (ref 8.5–10.1)
Chloride: 115 mmol/L — ABNORMAL HIGH (ref 98–107)
Co2: 13 mmol/L — ABNORMAL LOW (ref 21–32)
Creatinine: 9.03 mg/dL — ABNORMAL HIGH (ref 0.60–1.30)
EGFR (African American): 7 — ABNORMAL LOW
EGFR (Non-African Amer.): 6 — ABNORMAL LOW
Glucose: 103 mg/dL — ABNORMAL HIGH (ref 65–99)
Osmolality: 322 (ref 275–301)
Potassium: 3.9 mmol/L (ref 3.5–5.1)
Sodium: 136 mmol/L (ref 136–145)

## 2011-11-26 LAB — PHOSPHORUS: Phosphorus: 12 mg/dL — ABNORMAL HIGH (ref 2.5–4.9)

## 2011-11-26 LAB — TROPONIN I: Troponin-I: <0.02 ng/mL

## 2011-11-26 LAB — MAGNESIUM: Magnesium: 2.3 mg/dL

## 2011-11-26 LAB — STOOL CULTURE

## 2011-11-26 LAB — CLOSTRIDIUM DIFFICILE BY PCR

## 2011-11-27 ENCOUNTER — Telehealth: Payer: Self-pay | Admitting: *Deleted

## 2011-11-27 LAB — COMPREHENSIVE METABOLIC PANEL
Albumin: 3.3 g/dL — ABNORMAL LOW (ref 3.4–5.0)
Alkaline Phosphatase: 70 U/L (ref 50–136)
Anion Gap: 8 (ref 7–16)
BUN: 139 mg/dL — ABNORMAL HIGH (ref 7–18)
Bilirubin,Total: 0.4 mg/dL (ref 0.2–1.0)
Calcium, Total: 8.1 mg/dL — ABNORMAL LOW (ref 8.5–10.1)
Chloride: 114 mmol/L — ABNORMAL HIGH (ref 98–107)
Co2: 13 mmol/L — ABNORMAL LOW (ref 21–32)
Creatinine: 7.11 mg/dL — ABNORMAL HIGH (ref 0.60–1.30)
EGFR (African American): 9 — ABNORMAL LOW
EGFR (Non-African Amer.): 8 — ABNORMAL LOW
Glucose: 104 mg/dL — ABNORMAL HIGH (ref 65–99)
Osmolality: 316 (ref 275–301)
Potassium: 3.4 mmol/L — ABNORMAL LOW (ref 3.5–5.1)
SGOT(AST): 29 U/L (ref 15–37)
SGPT (ALT): 25 U/L (ref 12–78)
Sodium: 135 mmol/L — ABNORMAL LOW (ref 136–145)
Total Protein: 8.4 g/dL — ABNORMAL HIGH (ref 6.4–8.2)

## 2011-11-27 LAB — CBC WITH DIFFERENTIAL/PLATELET
Basophil #: 0.1 10*3/uL (ref 0.0–0.1)
Basophil %: 0.8 %
Eosinophil #: 0.4 10*3/uL (ref 0.0–0.7)
Eosinophil %: 5.2 %
HCT: 34.1 % — ABNORMAL LOW (ref 40.0–52.0)
HGB: 12.3 g/dL — ABNORMAL LOW (ref 13.0–18.0)
Lymphocyte #: 2.9 10*3/uL (ref 1.0–3.6)
Lymphocyte %: 36.1 %
MCH: 32.9 pg (ref 26.0–34.0)
MCHC: 36.2 g/dL — ABNORMAL HIGH (ref 32.0–36.0)
MCV: 91 fL (ref 80–100)
Monocyte #: 2.2 x10 3/mm — ABNORMAL HIGH (ref 0.2–1.0)
Monocyte %: 26.9 %
Neutrophil #: 2.5 10*3/uL (ref 1.4–6.5)
Neutrophil %: 31 %
Platelet: 391 10*3/uL (ref 150–440)
RBC: 3.75 10*6/uL — ABNORMAL LOW (ref 4.40–5.90)
RDW: 13 % (ref 11.5–14.5)
WBC: 8.1 10*3/uL (ref 3.8–10.6)

## 2011-11-27 NOTE — Telephone Encounter (Signed)
RN called pt to check on status of diarrhea.  Spoke with pt.  Pt hospitalized at [facility] 11/26/11 w/ continuing diarrhea, low blood pressure and renal insufficiency.  RN requested that the pt have the [facility] MD to send the discharge summary to Dr. Daiva Eves.  Pt stated that he would.

## 2011-11-28 LAB — RENAL FUNCTION PANEL
Albumin: 3.3 g/dL — ABNORMAL LOW (ref 3.4–5.0)
Anion Gap: 1 — ABNORMAL LOW (ref 7–16)
BUN: 126 mg/dL — ABNORMAL HIGH (ref 7–18)
Calcium, Total: 8.3 mg/dL — ABNORMAL LOW (ref 8.5–10.1)
Chloride: 116 mmol/L — ABNORMAL HIGH (ref 98–107)
Co2: 21 mmol/L (ref 21–32)
Creatinine: 4.7 mg/dL — ABNORMAL HIGH (ref 0.60–1.30)
EGFR (African American): 15 — ABNORMAL LOW
EGFR (Non-African Amer.): 13 — ABNORMAL LOW
Glucose: 105 mg/dL — ABNORMAL HIGH (ref 65–99)
Osmolality: 317 (ref 275–301)
Phosphorus: 6 mg/dL — ABNORMAL HIGH (ref 2.5–4.9)
Potassium: 3.4 mmol/L — ABNORMAL LOW (ref 3.5–5.1)
Sodium: 138 mmol/L (ref 136–145)

## 2011-11-29 LAB — BASIC METABOLIC PANEL
Anion Gap: 2 — ABNORMAL LOW (ref 7–16)
BUN: 94 mg/dL — ABNORMAL HIGH (ref 7–18)
Calcium, Total: 8.7 mg/dL (ref 8.5–10.1)
Chloride: 113 mmol/L — ABNORMAL HIGH (ref 98–107)
Co2: 26 mmol/L (ref 21–32)
Creatinine: 3.16 mg/dL — ABNORMAL HIGH (ref 0.60–1.30)
EGFR (African American): 24 — ABNORMAL LOW
EGFR (Non-African Amer.): 21 — ABNORMAL LOW
Glucose: 106 mg/dL — ABNORMAL HIGH (ref 65–99)
Osmolality: 311 (ref 275–301)
Potassium: 3.3 mmol/L — ABNORMAL LOW (ref 3.5–5.1)
Sodium: 141 mmol/L (ref 136–145)

## 2011-11-29 LAB — STOOL CULTURE

## 2011-11-29 NOTE — Telephone Encounter (Signed)
We should make sure he has FU with [place] in [facility]. Do we have openings with ANY docs here next 2 weeks?

## 2011-11-30 NOTE — Telephone Encounter (Signed)
Please offer him a follow up visit with me next week.

## 2011-12-04 ENCOUNTER — Other Ambulatory Visit: Payer: Self-pay | Admitting: Infectious Disease

## 2011-12-04 ENCOUNTER — Telehealth: Payer: Self-pay | Admitting: *Deleted

## 2011-12-04 NOTE — Telephone Encounter (Signed)
Pt admitted to [facility] for viral gastroenteritis, dehydration, hypotension and kidney problems.  Pt shared that he was discharged 11/30/11. He is feeling much better.  He stated that he lost approx. 13 pounds while ill.   He has [facility] appts with Dr. Duncan Dull and a nephrologist in [place].  RN advised pt to ask Dr. Darrick Huntsman if she would agree to become his PCP due to the pt living in [place].  Pt stated that he would think about it.  Pt has a f/u appt scheduled with Dr. Daiva Eves for 12/17/11.  RN advised to call [facility] or Dr. Darrick Huntsman if he has a recurrence of symptoms.  Pt verbalized understanding.

## 2011-12-06 ENCOUNTER — Other Ambulatory Visit: Payer: No Typology Code available for payment source

## 2011-12-07 ENCOUNTER — Ambulatory Visit (INDEPENDENT_AMBULATORY_CARE_PROVIDER_SITE_OTHER): Payer: No Typology Code available for payment source | Admitting: Internal Medicine

## 2011-12-07 ENCOUNTER — Encounter: Payer: Self-pay | Admitting: Internal Medicine

## 2011-12-07 VITALS — BP 128/74 | HR 77 | Temp 98.1°F | Resp 12 | Ht 71.5 in | Wt 219.5 lb

## 2011-12-07 DIAGNOSIS — K5289 Other specified noninfective gastroenteritis and colitis: Secondary | ICD-10-CM

## 2011-12-07 DIAGNOSIS — K529 Noninfective gastroenteritis and colitis, unspecified: Secondary | ICD-10-CM

## 2011-12-07 DIAGNOSIS — N189 Chronic kidney disease, unspecified: Secondary | ICD-10-CM

## 2011-12-07 DIAGNOSIS — Z23 Encounter for immunization: Secondary | ICD-10-CM

## 2011-12-07 DIAGNOSIS — I10 Essential (primary) hypertension: Secondary | ICD-10-CM

## 2011-12-07 DIAGNOSIS — N179 Acute kidney failure, unspecified: Secondary | ICD-10-CM

## 2011-12-07 DIAGNOSIS — E669 Obesity, unspecified: Secondary | ICD-10-CM

## 2011-12-07 NOTE — Progress Notes (Signed)
Patient ID: Adam Becker, male   DOB: [birth year], 56 y.o.   MRN: [id number]   Patient Active Problem List  Diagnosis  . HIV INFECTION  . NEUROSYPHILIS  . LATE SYPHILIS, LATENT  . HYPERLIPIDEMIA  . ERECTILE DYSFUNCTION, MILD  . HYPERTENSION, BENIGN ESSENTIAL  . IMPAIRED RENAL FUNCTION NOS  . PARESTHESIA, HANDS  . Acute gastroenteritis  . Acute on chronic renal failure  . Obesity (BMI 30.0-34.9)    Subjective:  CC:   Chief Complaint  Patient presents with  . Follow-up    HPI:   Adam Becker is a 56 y.o. male who presents as a new patient to establish primary care with the chief complaint of hospital discharge. He was recently discharged from [facility] on Nov 8.  He was admitted with dehdyration and acute renal failure on Nov 3 secondary to prolonged illness from a stomach virus.   No recent travel, sick contacts.  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  Treated himself for 6 days before he felt so weak that he went to the ER and was admitted.  His hospital stay was 5 days.  Had an ultrasound of kidneys.  .  Feels fine now,  appetie is good, stools are normal.  No nausea. bp meds were held at time of discharge/. (lisinopril and hctz)  Scheduled to see nephrology tomorrow .   He has a history of HIV diagnosed in 1995, managed by Dr. Daiva Eves.    Past Medical History  Diagnosis Date  . HIV infection   . Hypertension   . Hyperlipidemia     Past Surgical History  Procedure Date  . Splenectomy 2000    reason unclear "stopped working"    Family History  Problem Relation Age of Onset  . Cancer Mother   . Cancer Father     colon CA  . Birth defects Son     Breast     History   Social History  . Marital Status: Single    Spouse Name: N/A    Number of Children: N/A  . Years of Education: N/A   Occupational History  . Not on file.   Social  History Main Topics  . Smoking status: Never Smoker   . Smokeless tobacco: Never Used  . Alcohol Use: No  . Drug Use: No  . Sexually Active: Not on file     Comment: declined condoms   Other Topics Concern  . Not on file   Social History Narrative  . No narrative on file         @ALLHX @    Review of Systems:   The remainder of the review of systems was negative except those addressed in the HPI.       Objective:  BP 128/74  Pulse 77  Temp 98.1 F (36.7 C) (Oral)  Resp 12  Ht 5' 11.5" (1.816 m)  Wt 219 lb 8 oz (99.565 kg)  BMI 30.19 kg/m2  SpO2 99%  General appearance: alert, cooperative and appears stated age Ears: normal TM's and external ear canals both ears Throat: lips, mucosa, and tongue normal; teeth and gums normal Neck: no adenopathy, no carotid bruit, supple, symmetrical, trachea midline and thyroid not enlarged, symmetric, no tenderness/mass/nodules Back: symmetric, no curvature. ROM normal. No CVA tenderness. Lungs: clear to auscultation bilaterally Heart: regular rate and rhythm, S1, S2 normal, no murmur,  click, rub or gallop Abdomen: soft, non-tender; bowel sounds normal; no masses,  no organomegaly Pulses: 2+ and symmetric Skin: Skin color, texture, turgor normal. No rashes or lesions Lymph nodes: Cervical, supraclavicular, and axillary nodes normal.  Assessment and Plan:  Acute on chronic renal failure Secondary to ATN from severe dehydration, continued use of ACE/HCTZ and NSAIds resulting in severe metabolic acidosis requiring 3 days of aggressive hydration and  bicarbonate drip.  BUN/Cr was 159/11  And C02 was 9 at admission and 3.15 at discharge , BUN 94., C02 26.    ACE Inhibitor was not resumed at discharge from [facility],  Follow up with nephrology tomorrow for  Repeat evaluation.   Acute gastroenteritis His diarrhea was investigated by Orson Aloe.  He was treated empirically with cipro/flagyl until all stool studies were negative.   Opportunistic infectison were considered less likely given his high CD4 count. Marland Kitchen  HYPERTENSION, BENIGN ESSENTIAL previously treated with ACE/HCT which was held at admission and not resumed.  There is no indication for alternative treatment currently.   Updated Medication List Outpatient Encounter Prescriptions as of 12/07/2011  Medication Sig Dispense Refill  . darunavir (PREZISTA) 400 MG tablet Take 2 tablets (800 mg total) by mouth daily. With Norvir  60 tablet  11  . Etravirine (INTELENCE) 200 MG TABS Take 400 mg by mouth daily.  60 tablet  11  . pravastatin (PRAVACHOL) 40 MG tablet TAKE 1 TABLET (40 MG TOTAL) BY MOUTH DAILY.  30 tablet  6  . promethazine (PHENERGAN) 12.5 MG tablet Take 1 tablet (12.5 mg total) by mouth every 6 (six) hours as needed for nausea.  30 tablet  0  . raltegravir (ISENTRESS) 400 MG tablet Take 1 tablet (400 mg total) by mouth 2 (two) times daily.  60 tablet  11  . ritonavir (NORVIR) 100 MG TABS Take 1 tablet (100 mg total) by mouth daily.  30 tablet  11  . [DISCONTINUED] lisinopril-hydrochlorothiazide (PRINZIDE,ZESTORETIC) 20-12.5 MG per tablet TAKE 1 TABLET BY MOUTH DAILY.  30 tablet  6  . sildenafil (VIAGRA) 50 MG tablet Take 0.5 tablets (25 mg total) by mouth as needed. Take half of tablet by mouth 1 hour before sexual activity.  4 tablet  2     Orders Placed This Encounter  Procedures  . Flu vaccine greater than or equal to 3yo preservative free IM  . HM COLONOSCOPY    Return in about 6 months (around 06/05/2012).

## 2011-12-08 DIAGNOSIS — E872 Acidosis: Secondary | ICD-10-CM | POA: Diagnosis not present

## 2011-12-08 DIAGNOSIS — M109 Gout, unspecified: Secondary | ICD-10-CM | POA: Diagnosis not present

## 2011-12-08 DIAGNOSIS — I959 Hypotension, unspecified: Secondary | ICD-10-CM | POA: Diagnosis not present

## 2011-12-08 DIAGNOSIS — N170 Acute kidney failure with tubular necrosis: Secondary | ICD-10-CM | POA: Diagnosis not present

## 2011-12-08 DIAGNOSIS — N179 Acute kidney failure, unspecified: Secondary | ICD-10-CM | POA: Diagnosis not present

## 2011-12-08 DIAGNOSIS — E876 Hypokalemia: Secondary | ICD-10-CM | POA: Diagnosis not present

## 2011-12-09 DIAGNOSIS — N179 Acute kidney failure, unspecified: Secondary | ICD-10-CM | POA: Insufficient documentation

## 2011-12-09 DIAGNOSIS — N189 Chronic kidney disease, unspecified: Secondary | ICD-10-CM | POA: Insufficient documentation

## 2011-12-09 DIAGNOSIS — E669 Obesity, unspecified: Secondary | ICD-10-CM | POA: Insufficient documentation

## 2011-12-09 NOTE — Assessment & Plan Note (Addendum)
Secondary to ATN from severe dehydration, continued use of ACE/HCTZ and NSAIds resulting in severe metabolic acidosis requiring 3 days of aggressive hydration and  bicarbonate drip.  BUN/Cr was 159/11  And C02 was 9 at admission and 3.15 at discharge , BUN 94., C02 26.    ACE Inhibitor was not resumed at discharge from [facility],  Follow up with nephrology tomorrow for  Repeat evaluation.

## 2011-12-09 NOTE — Assessment & Plan Note (Signed)
His diarrhea was investigated by Orson Aloe.  He was treated empirically with cipro/flagyl until all stool studies were negative.  Opportunistic infectison were considered less likely given his high CD4 count. Marland Kitchen

## 2011-12-09 NOTE — Assessment & Plan Note (Addendum)
previously treated with ACE/HCT which was held at admission and not resumed.  There is no indication for alternative treatment currently.

## 2011-12-19 ENCOUNTER — Other Ambulatory Visit: Payer: Self-pay | Admitting: Infectious Disease

## 2011-12-20 ENCOUNTER — Ambulatory Visit (INDEPENDENT_AMBULATORY_CARE_PROVIDER_SITE_OTHER): Payer: No Typology Code available for payment source | Admitting: Infectious Disease

## 2011-12-20 VITALS — BP 153/97 | HR 75 | Temp 98.0°F | Wt 213.0 lb

## 2011-12-20 DIAGNOSIS — A084 Viral intestinal infection, unspecified: Secondary | ICD-10-CM

## 2011-12-20 DIAGNOSIS — B20 Human immunodeficiency virus [HIV] disease: Secondary | ICD-10-CM

## 2011-12-20 DIAGNOSIS — A088 Other specified intestinal infections: Secondary | ICD-10-CM

## 2011-12-20 DIAGNOSIS — I10 Essential (primary) hypertension: Secondary | ICD-10-CM

## 2011-12-20 DIAGNOSIS — N179 Acute kidney failure, unspecified: Secondary | ICD-10-CM

## 2011-12-20 DIAGNOSIS — N189 Chronic kidney disease, unspecified: Secondary | ICD-10-CM

## 2011-12-20 MED ORDER — DARUNAVIR ETHANOLATE 800 MG PO TABS: 800.0000 mg | ORAL_TABLET | Freq: Every day | ORAL | Status: DC

## 2011-12-20 MED ORDER — DOLUTEGRAVIR SODIUM 50 MG PO TABS: 50.0000 mg | ORAL_TABLET | Freq: Every day | ORAL | Status: DC

## 2011-12-20 MED ORDER — ETRAVIRINE 200 MG PO TABS: 400.0000 mg | ORAL_TABLET | Freq: Every day | ORAL | Status: DC

## 2011-12-20 NOTE — Patient Instructions (Signed)
YOUR NEW HIV REGIMEN IS AS FOLLOWS  PREZISTA 800MG  ONCE DAILY WITH  NORVIR 100MG  ONCE DAILY  INTELENCE 200MG  TABLET TAKE TWO TABLETS FOR 400MG  ONCE DAILY  AND  TAKE TIVICAY 50MG  ONE TABLET DAILY  STOP THE ISENTRESS

## 2011-12-20 NOTE — Progress Notes (Signed)
  Subjective:    Patient ID: Adam Becker, male    DOB: 04-28-1955, 56 y.o.   MRN: [id number]  HPI  56 year old with HIV perfectly controlled on his salvage regimen of twice daily isentress, intelence and once daily prezista, norvir returns for FU appt post hospitalization after he suffered from apparent viral GE and ARF. Dr. Orvan Falconer had worked him up in our clinic for various bacterial causes and these were all negative. He ultimately was admitted to [facility] and his bp meds were stopped and he was given IVF. His creatinine has returned to baseline and labs faxed to ous today from [facility] in [place] show Cr now at 1.54 , K 4.7, P 3.8. I have decided to simplify his salvage regimen further by subtituting TIVICAY for his isentress and making his regimen a once daily one.    Review of Systems  Constitutional: Negative for fever, chills, diaphoresis, activity change, appetite change, fatigue and unexpected weight change.  HENT: Negative for congestion, sore throat, rhinorrhea, sneezing, trouble swallowing and sinus pressure.   Eyes: Negative for photophobia and visual disturbance.  Respiratory: Negative for cough, chest tightness, shortness of breath, wheezing and stridor.   Cardiovascular: Negative for chest pain, palpitations and leg swelling.  Gastrointestinal: Negative for nausea, vomiting, abdominal pain, diarrhea, constipation, blood in stool, abdominal distention and anal bleeding.  Genitourinary: Negative for dysuria, hematuria, flank pain and difficulty urinating.  Musculoskeletal: Negative for myalgias, back pain, joint swelling, arthralgias and gait problem.  Skin: Negative for color change, pallor, rash and wound.  Neurological: Negative for dizziness, tremors, weakness and light-headedness.  Hematological: Negative for adenopathy. Does not bruise/bleed easily.  Psychiatric/Behavioral: Negative for behavioral problems, confusion, sleep disturbance, dysphoric mood,  decreased concentration and agitation.       Objective:   Physical Exam  Constitutional: He is oriented to person, place, and time. He appears well-developed and well-nourished. No distress.  HENT:  Head: Normocephalic and atraumatic.  Mouth/Throat: Oropharynx is clear and moist. No oropharyngeal exudate.  Eyes: Conjunctivae normal and EOM are normal. Pupils are equal, round, and reactive to light. No scleral icterus.  Neck: Normal range of motion. Neck supple. No JVD present.  Cardiovascular: Normal rate, regular rhythm and normal heart sounds.  Exam reveals no gallop and no friction rub.   No murmur heard. Pulmonary/Chest: Effort normal and breath sounds normal. No respiratory distress. He has no wheezes. He has no rales. He exhibits no tenderness.  Abdominal: He exhibits no distension and no mass. There is no tenderness. There is no rebound and no guarding.  Musculoskeletal: He exhibits no edema and no tenderness.  Lymphadenopathy:    He has no cervical adenopathy.  Neurological: He is alert and oriented to person, place, and time. He has normal reflexes. He exhibits normal muscle tone. Coordination normal.  Skin: Skin is warm and dry. He is not diaphoretic. No erythema. No pallor.  Psychiatric: He has a normal mood and affect. His behavior is normal. Judgment and thought content normal.          Assessment & Plan:  HIV: change to prezista, norvir and 400mg  of intelence once daily with TIvicay 50mg  once daily and dc isentress  Renal failure: resolved was secondary to acute GI illness, does have CKD and is followed by [facility]  HTN: Dr. Darrick Huntsman will decide what meds to reinstitute in  Future  VIral GE: resolved  CKD: see above, fu with Dr. Thedore Mins and Dr. Wynelle Link

## 2011-12-26 ENCOUNTER — Telehealth: Payer: Self-pay | Admitting: Internal Medicine

## 2011-12-26 ENCOUNTER — Other Ambulatory Visit: Payer: Self-pay

## 2011-12-26 DIAGNOSIS — I10 Essential (primary) hypertension: Secondary | ICD-10-CM | POA: Diagnosis not present

## 2011-12-26 DIAGNOSIS — B20 Human immunodeficiency virus [HIV] disease: Secondary | ICD-10-CM | POA: Diagnosis not present

## 2011-12-26 MED ORDER — PREDNISONE (PAK) 10 MG PO TABS: ORAL_TABLET | ORAL | Status: DC

## 2011-12-26 MED ORDER — HYDROCODONE-ACETAMINOPHEN 5-325 MG PO TABS: 1.0000 | ORAL_TABLET | Freq: Four times a day (QID) | ORAL | Status: DC | PRN

## 2011-12-26 NOTE — Telephone Encounter (Signed)
Rx called in to [facility]. 

## 2011-12-26 NOTE — Telephone Encounter (Signed)
Patient Information:  Caller Name: Crystian  Phone: [phone]  Patient: Adam Becker, Adam Becker  Gender: Male  DOB: November 03, 1955  Age: 56 Years  PCP: Duncan Dull (Adults only)   Symptoms  Reason For Call & Symptoms: Gout flare up right great toe, second toe.  States seen in office 12/07/11 and seen by nephrologist 12/08/11; told had elevated uric acid levels.  Reviewed Health History In EMR: Yes  Reviewed Medications In EMR: Yes  Reviewed Allergies In EMR: Yes  Reviewed Surgeries / Procedures: Yes  Date of Onset of Symptoms: Unknown  Guideline(s) Used:  Foot Pain  Disposition Per Guideline:   See Today in Office  Reason For Disposition Reached:   Swollen foot (EXCEPTIONs: localized bump from bunions, calluses, insect bite, sting)  Advice Given:  N/A  Office Follow Up:  Does the office need to follow up with this patient?: Yes  Instructions For The Office: appt management krs/can  RN Note:  Gout flare; taking allopurinol but states pain is intense and would like something for pain.  Advised per protocol to make appt for today; no appts available per Epic.  Info to office for staff/provider appt management.  May reach patient at [phone].  krs/can

## 2011-12-26 NOTE — Telephone Encounter (Signed)
Please call in the prednisone and vicodine rxs i added to EPIc for this patient.  Give him follow up appt.

## 2012-01-11 DIAGNOSIS — N2581 Secondary hyperparathyroidism of renal origin: Secondary | ICD-10-CM | POA: Diagnosis not present

## 2012-01-11 DIAGNOSIS — N179 Acute kidney failure, unspecified: Secondary | ICD-10-CM | POA: Diagnosis not present

## 2012-01-11 DIAGNOSIS — I10 Essential (primary) hypertension: Secondary | ICD-10-CM | POA: Diagnosis not present

## 2012-01-11 DIAGNOSIS — N183 Chronic kidney disease, stage 3 unspecified: Secondary | ICD-10-CM | POA: Diagnosis not present

## 2012-02-14 ENCOUNTER — Other Ambulatory Visit: Payer: No Typology Code available for payment source

## 2012-02-14 ENCOUNTER — Other Ambulatory Visit: Payer: Self-pay | Admitting: Infectious Disease

## 2012-02-14 DIAGNOSIS — B20 Human immunodeficiency virus [HIV] disease: Secondary | ICD-10-CM

## 2012-02-14 LAB — RPR TITER: RPR Titer: 1:8 {titer}

## 2012-02-14 LAB — COMPLETE METABOLIC PANEL WITH GFR
ALT: 23 U/L (ref 0–53)
AST: 28 U/L (ref 0–37)
Albumin: 4.2 g/dL (ref 3.5–5.2)
Alkaline Phosphatase: 54 U/L (ref 39–117)
BUN: 15 mg/dL (ref 6–23)
CO2: 26 mEq/L (ref 19–32)
Calcium: 9.5 mg/dL (ref 8.4–10.5)
Chloride: 106 mEq/L (ref 96–112)
Creat: 1.42 mg/dL — ABNORMAL HIGH (ref 0.50–1.35)
GFR, Est African American: 64 mL/min
GFR, Est Non African American: 55 mL/min — ABNORMAL LOW
Glucose, Bld: 92 mg/dL (ref 70–99)
Potassium: 4.4 mEq/L (ref 3.5–5.3)
Sodium: 140 mEq/L (ref 135–145)
Total Bilirubin: 0.4 mg/dL (ref 0.3–1.2)
Total Protein: 7.7 g/dL (ref 6.0–8.3)

## 2012-02-14 LAB — LIPID PANEL
Cholesterol: 191 mg/dL (ref 0–200)
HDL: 38 mg/dL — ABNORMAL LOW (ref >39)
LDL Cholesterol: 128 mg/dL — ABNORMAL HIGH (ref 0–99)
Total CHOL/HDL Ratio: 5 Ratio
Triglycerides: 124 mg/dL (ref <150)
VLDL: 25 mg/dL (ref 0–40)

## 2012-02-14 LAB — CBC WITH DIFFERENTIAL/PLATELET
Basophils Absolute: 0.1 10*3/uL (ref 0.0–0.1)
Basophils Relative: 1 % (ref 0–1)
Eosinophils Absolute: 0.3 10*3/uL (ref 0.0–0.7)
Eosinophils Relative: 5 % (ref 0–5)
HCT: 40.6 % (ref 39.0–52.0)
Hemoglobin: 14 g/dL (ref 13.0–17.0)
Lymphocytes Relative: 59 % — ABNORMAL HIGH (ref 12–46)
Lymphs Abs: 3.6 10*3/uL (ref 0.7–4.0)
MCH: 32 pg (ref 26.0–34.0)
MCHC: 34.5 g/dL (ref 30.0–36.0)
MCV: 92.9 fL (ref 78.0–100.0)
Monocytes Absolute: 0.8 10*3/uL (ref 0.1–1.0)
Monocytes Relative: 13 % — ABNORMAL HIGH (ref 3–12)
Neutro Abs: 1.3 10*3/uL — ABNORMAL LOW (ref 1.7–7.7)
Neutrophils Relative %: 22 % — ABNORMAL LOW (ref 43–77)
Platelets: 404 10*3/uL — ABNORMAL HIGH (ref 150–400)
RBC: 4.37 MIL/uL (ref 4.22–5.81)
RDW: 13.7 % (ref 11.5–15.5)
WBC: 6.1 10*3/uL (ref 4.0–10.5)

## 2012-02-14 LAB — RPR: RPR Ser Ql: REACTIVE — AB

## 2012-02-15 LAB — T.PALLIDUM AB, TOTAL: T pallidum Antibodies (TP-PA): >8 S/CO — ABNORMAL HIGH (ref <0.90)

## 2012-02-15 LAB — HIV-1 RNA QUANT-NO REFLEX-BLD
HIV 1 RNA Quant: <20 copies/mL (ref <20)
HIV-1 RNA Quant, Log: <1.3 {Log} (ref <1.30)

## 2012-02-15 LAB — T-HELPER CELL (CD4) - (RCID CLINIC ONLY)
CD4 % Helper T Cell: 23 % — ABNORMAL LOW (ref 33–55)
CD4 T Cell Abs: 880 uL (ref 400–2700)

## 2012-02-28 ENCOUNTER — Ambulatory Visit: Payer: No Typology Code available for payment source | Admitting: Infectious Disease

## 2012-02-29 ENCOUNTER — Encounter: Payer: Self-pay | Admitting: Infectious Disease

## 2012-02-29 ENCOUNTER — Ambulatory Visit (INDEPENDENT_AMBULATORY_CARE_PROVIDER_SITE_OTHER): Payer: No Typology Code available for payment source | Admitting: Infectious Disease

## 2012-02-29 VITALS — BP 124/85 | HR 78 | Temp 98.4°F | Ht 71.0 in | Wt 220.0 lb

## 2012-02-29 DIAGNOSIS — B20 Human immunodeficiency virus [HIV] disease: Secondary | ICD-10-CM

## 2012-02-29 DIAGNOSIS — N289 Disorder of kidney and ureter, unspecified: Secondary | ICD-10-CM

## 2012-02-29 DIAGNOSIS — Z23 Encounter for immunization: Secondary | ICD-10-CM

## 2012-02-29 DIAGNOSIS — I10 Essential (primary) hypertension: Secondary | ICD-10-CM

## 2012-02-29 NOTE — Progress Notes (Signed)
  Subjective:    Patient ID: Adam Becker, male    DOB: [birth year], 56 y.o.   MRN: [id number]  HPI  57 yearold man with HIV on prezista, norvir and 400mg  of intelence once daily with TIvicay 50mg  once daily salvage regimen with good virological control and healthy cd4 count. He has no specific complaints today. He is not sexually active. He has no problems with mood. He has followed up with Dr. Darrick Huntsman for PCP.   Review of Systems  Constitutional: Negative for fever, chills, diaphoresis, activity change, appetite change, fatigue and unexpected weight change.  HENT: Negative for congestion, sore throat, rhinorrhea, sneezing, trouble swallowing and sinus pressure.   Eyes: Negative for photophobia and visual disturbance.  Respiratory: Negative for cough, chest tightness, shortness of breath, wheezing and stridor.   Cardiovascular: Negative for chest pain, palpitations and leg swelling.  Gastrointestinal: Negative for nausea, vomiting, abdominal pain, diarrhea, constipation, blood in stool, abdominal distention and anal bleeding.  Genitourinary: Negative for dysuria, hematuria, flank pain and difficulty urinating.  Musculoskeletal: Negative for myalgias, back pain, joint swelling, arthralgias and gait problem.  Skin: Negative for color change, pallor, rash and wound.  Neurological: Negative for dizziness, tremors, weakness and light-headedness.  Hematological: Negative for adenopathy. Does not bruise/bleed easily.  Psychiatric/Behavioral: Negative for behavioral problems, confusion, sleep disturbance, dysphoric mood, decreased concentration and agitation.       Objective:   Physical Exam  Constitutional: He is oriented to person, place, and time. He appears well-developed and well-nourished. No distress.  HENT:  Head: Normocephalic and atraumatic.  Mouth/Throat: Oropharynx is clear and moist. No oropharyngeal exudate.  Eyes: Conjunctivae normal and EOM are normal. Pupils are equal, round,  and reactive to light. No scleral icterus.  Neck: Normal range of motion. Neck supple. No JVD present.  Cardiovascular: Normal rate, regular rhythm and normal heart sounds.  Exam reveals no gallop and no friction rub.   No murmur heard. Pulmonary/Chest: Effort normal and breath sounds normal. No respiratory distress. He has no wheezes. He has no rales. He exhibits no tenderness.  Abdominal: He exhibits no distension and no mass. There is no tenderness. There is no rebound and no guarding.  Musculoskeletal: He exhibits no edema and no tenderness.  Lymphadenopathy:    He has no cervical adenopathy.  Neurological: He is alert and oriented to person, place, and time. He exhibits normal muscle tone. Coordination normal.  Skin: Skin is warm and dry. He is not diaphoretic. No erythema. No pallor.  Psychiatric: He has a normal mood and affect. His behavior is normal. Judgment and thought content normal.          Assessment & Plan:  HIV: continue current salvage regimen. Consider ANY options to simplify regimen  HTN: BP improved

## 2012-08-31 ENCOUNTER — Emergency Department: Payer: Self-pay | Admitting: Emergency Medicine

## 2012-08-31 LAB — URIC ACID: Uric Acid: 6 mg/dL (ref 3.5–7.2)

## 2012-09-12 ENCOUNTER — Other Ambulatory Visit: Payer: Self-pay | Admitting: Infectious Disease

## 2012-09-18 ENCOUNTER — Ambulatory Visit (INDEPENDENT_AMBULATORY_CARE_PROVIDER_SITE_OTHER): Payer: No Typology Code available for payment source | Admitting: Internal Medicine

## 2012-09-18 ENCOUNTER — Encounter: Payer: Self-pay | Admitting: Internal Medicine

## 2012-09-18 VITALS — BP 106/78 | HR 66 | Temp 98.3°F | Resp 14 | Wt 217.8 lb

## 2012-09-18 DIAGNOSIS — E785 Hyperlipidemia, unspecified: Secondary | ICD-10-CM

## 2012-09-18 DIAGNOSIS — E669 Obesity, unspecified: Secondary | ICD-10-CM

## 2012-09-18 DIAGNOSIS — I10 Essential (primary) hypertension: Secondary | ICD-10-CM

## 2012-09-18 DIAGNOSIS — M109 Gout, unspecified: Secondary | ICD-10-CM

## 2012-09-18 DIAGNOSIS — N183 Chronic kidney disease, stage 3 unspecified: Secondary | ICD-10-CM

## 2012-09-18 MED ORDER — ALLOPURINOL 100 MG PO TABS: 100.0000 mg | ORAL_TABLET | Freq: Every day | ORAL | Status: DC

## 2012-09-18 MED ORDER — PREDNISONE (PAK) 10 MG PO TABS: ORAL_TABLET | ORAL | Status: DC

## 2012-09-18 NOTE — Patient Instructions (Addendum)
I am checking your kidney function and your uric acid level today  Continue to take allopurinol until you hear from me  Save the prednisone taper for the next attack.  Your gout attack occurred because of your kidney issues and your lapse in medication

## 2012-09-18 NOTE — Progress Notes (Signed)
Patient ID: Adam Becker, male   DOB: 1955/09/30, 57 y.o.   MRN: [id number]   Patient Active Problem List   Diagnosis Date Noted  . 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 Gout attack 09/20/2012  . Obesity (BMI 30.0-34.9) 12/09/2011  . HYPERLIPIDEMIA 11/01/2009  . NEUROSYPHILIS 03/11/2009  . PARESTHESIA, HANDS 06/18/2008  . LATE SYPHILIS, LATENT 10/30/2007  . ERECTILE DYSFUNCTION, MILD 12/31/2006  . HYPERTENSION, BENIGN ESSENTIAL 10/15/2006  . IMPAIRED RENAL FUNCTION NOS 10/15/2006  . HIV INFECTION 04/16/2006    Subjective:  CC:   Chief Complaint  Patient presents with  . Follow-up    6 months    HPI:   Adam Becker a 57 y.o. male who presents for followup on multiple chronic medical problems. He was last seen in November as a new patient following admission to [facility] for acute renal failure in the setting of dehydration and HIV. He has had nephrology and ID followup since then. He had a recent gout flare involving his left foot and left knee due to lapse in allopurinol use For a week.   Was treated in the ER 3 or 4 weekends ago with prednisone taper. Has resumed allopurinol as of 2 weeks ago. Symptoms have greatly improved.    Past Medical History  Diagnosis Date  . HIV infection   . Hypertension   . Hyperlipidemia   . Chronic kidney disease Nov 2013    admission for ATN cr 11, acidosis    Past Surgical History  Procedure Laterality Date  . Splenectomy  2000    reason unclear "stopped working"       The following portions of the patient's history were reviewed and updated as appropriate: Allergies, current medications, and problem list.    Review of Systems:   12 Pt  review of systems was negative except those addressed in the HPI,     History   Social History  . Marital Status: Single    Spouse Name: N/A    Number of Children: N/A  . Years of Education: N/A   Occupational History  . Not on file.   Social History  Main Topics  . Smoking status: Never Smoker   . Smokeless tobacco: Never Used  . Alcohol Use: No  . Drug Use: No  . Sexual Activity: Not Currently     Comment: declined condoms   Other Topics Concern  . Not on file   Social History Narrative  . No narrative on file    Objective:  Filed Vitals:   09/18/12 1355  BP: 106/78  Pulse: 66  Temp: 98.3 F (36.8 C)  Resp: 14     General appearance: alert, cooperative and appears stated age Neck: no adenopathy, no carotid bruit, supple, symmetrical, trachea midline and thyroid not enlarged, symmetric, no tenderness/mass/nodules Lungs: clear to auscultation bilaterally Heart: regular rate and rhythm, S1, S2 normal, no murmur, click, rub or gallop Abdomen: soft, non-tender; bowel sounds normal; no masses,  no organomegaly Pulses: 2+ and symmetric Skin: Skin color, texture, turgor normal. No rashes or lesions Lymph nodes: Cervical, supraclavicular, and axillary nodes normal. Foot exam:  Nails are well trimmed,  No callouses,  Sensation intact to microfilament.  No cellulitis or synovitis.   Assessment and Plan:  Chronic kidney disease (CKD), stage II (mild) Nephrology has resumed ACE inhibitor and added pravastatin. He has resumed allopurinol for prevention of gout after a temporary lapse. Creatinine appears to be stable at 1.5. Advised to  avoid nonsteroidal anti-inflammatories given his history of acute tubular necrosis.  HYPERLIPIDEMIA Now managed with pravastatin for low HDL mildly elevated LDL. Last LDL was 128 in January. He was not fasting today,  And will have a repea tfasting panel with next set of labs with ID followup  Gout attack His recent gout attack appears to have been triggered by abrupt discontinuation of allopurinol. He has resumed it as of 2-3 weeks ago. He also has mild renal impairment which may contribute to elevated uric acid levels. Diet discussed;  need for daily allopurinol. Advised to avoid nonsteroidal  anti-inflammatories for flares;will use prednisone instead.  HYPERTENSION, BENIGN ESSENTIAL Nephrology has resumed low-dose lisinopril and his blood pressure and kidney function are stable.  Obesity (BMI 30.0-34.9) I have addressed  BMI and recommended wt loss of 10% of body weight over the next 6 months using a low glycemic index diet and regular exercise a minimum of 5 days per week.   A total of 40 minutes was spent with patient more than half of which was spent in counseling, reviewing records from other prviders and coordination of care.   Updated Medication List Outpatient Encounter Prescriptions as of 09/18/2012  Medication Sig Dispense Refill  . allopurinol (ZYLOPRIM) 100 MG tablet Take 1 tablet (100 mg total) by mouth daily.  90 tablet  3  . Darunavir Ethanolate (PREZISTA) 800 MG tablet Take 1 tablet (800 mg total) by mouth daily.  30 tablet  11  . Dolutegravir Sodium (TIVICAY) 50 MG TABS Take 50 mg by mouth daily.  30 tablet  11  . Etravirine (INTELENCE) 200 MG TABS Take 400 mg by mouth daily.  60 tablet  11  . HYDROcodone-acetaminophen (NORCO/VICODIN) 5-325 MG per tablet Take 1 tablet by mouth every 6 (six) hours as needed for pain.  60 tablet  0  . lisinopril (PRINIVIL,ZESTRIL) 5 MG tablet Take 5 mg by mouth daily.      . NORVIR 100 MG capsule TAKE 1 CAPSULE BY MOUTH EVERY DAY  30 capsule  11  . pravastatin (PRAVACHOL) 40 MG tablet TAKE 1 TABLET BY MOUTH EVERY DAY  30 tablet  6  . [DISCONTINUED] allopurinol (ZYLOPRIM) 100 MG tablet Take 100 mg by mouth daily.      . predniSONE (STERAPRED UNI-PAK) 10 MG tablet 6 tablets on Day 1 , then reduce by 1 tablet daily until gone  21 tablet  0  . promethazine (PHENERGAN) 12.5 MG tablet Take 1 tablet (12.5 mg total) by mouth every 6 (six) hours as needed for nausea.  30 tablet  0  . sildenafil (VIAGRA) 50 MG tablet Take 0.5 tablets (25 mg total) by mouth as needed. Take half of tablet by mouth 1 hour before sexual activity.  4 tablet  2  .  [DISCONTINUED] predniSONE (STERAPRED UNI-PAK) 10 MG tablet 6 tablets on Day 1 , then reduce by 1 tablet daily until gone  21 tablet  0  . [DISCONTINUED] predniSONE (STERAPRED UNI-PAK) 10 MG tablet 10 mg. 6 tablets on Day 1 , then reduce by 1 tablet daily until gone       No facility-administered encounter medications on file as of 09/18/2012.     Orders Placed This Encounter  Procedures  . Uric acid  . Comprehensive metabolic panel    No Follow-up on file.

## 2012-09-19 LAB — COMPREHENSIVE METABOLIC PANEL
ALT: 31 U/L (ref 0–53)
AST: 29 U/L (ref 0–37)
Albumin: 4.1 g/dL (ref 3.5–5.2)
Alkaline Phosphatase: 64 U/L (ref 39–117)
BUN: 13 mg/dL (ref 6–23)
CO2: 28 mEq/L (ref 19–32)
Calcium: 10.1 mg/dL (ref 8.4–10.5)
Chloride: 99 mEq/L (ref 96–112)
Creatinine, Ser: 1.5 mg/dL (ref 0.4–1.5)
GFR: 62.27 mL/min (ref >60.00)
Glucose, Bld: 79 mg/dL (ref 70–99)
Potassium: 4 mEq/L (ref 3.5–5.1)
Sodium: 137 mEq/L (ref 135–145)
Total Bilirubin: 0.5 mg/dL (ref 0.3–1.2)
Total Protein: 8.1 g/dL (ref 6.0–8.3)

## 2012-09-19 LAB — URIC ACID: Uric Acid, Serum: 7.2 mg/dL (ref 4.0–7.8)

## 2012-09-20 ENCOUNTER — Encounter: Payer: Self-pay | Admitting: Internal Medicine

## 2012-09-20 DIAGNOSIS — M109 Gout, unspecified: Secondary | ICD-10-CM | POA: Insufficient documentation

## 2012-09-20 DIAGNOSIS — Z8739 Personal history of other diseases of the musculoskeletal system and connective tissue: Secondary | ICD-10-CM | POA: Insufficient documentation

## 2012-09-20 DIAGNOSIS — N182 Chronic kidney disease, stage 2 (mild): Secondary | ICD-10-CM | POA: Insufficient documentation

## 2012-09-20 NOTE — Assessment & Plan Note (Signed)
Now managed with pravastatin for low HDL mildly elevated LDL. Last LDL was 128 in January. He was not fasting today,  And will have a repea tfasting panel with next set of labs with ID followup

## 2012-09-20 NOTE — Assessment & Plan Note (Addendum)
I have addressed  BMI and recommended wt loss of 10% of body weight over the next 6 months using a low glycemic index diet and regular exercise a minimum of 5 days per week.   

## 2012-09-20 NOTE — Assessment & Plan Note (Addendum)
Nephrology has resumed ACE inhibitor and added pravastatin. He has resumed allopurinol for prevention of gout after a temporary lapse. Creatinine appears to be stable at 1.5. Advised to avoid nonsteroidal anti-inflammatories given his history of acute tubular necrosis.

## 2012-09-20 NOTE — Assessment & Plan Note (Signed)
Nephrology has resumed low-dose lisinopril and his blood pressure and kidney function are stable.

## 2012-09-20 NOTE — Assessment & Plan Note (Signed)
His recent gout attack appears to have been triggered by abrupt discontinuation of allopurinol. He has resumed it as of 2-3 weeks ago. He also has mild renal impairment which may contribute to elevated uric acid levels. Diet discussed;  need for daily allopurinol. Advised to avoid nonsteroidal anti-inflammatories for flares;will use prednisone instead.

## 2012-09-24 ENCOUNTER — Other Ambulatory Visit (INDEPENDENT_AMBULATORY_CARE_PROVIDER_SITE_OTHER): Payer: No Typology Code available for payment source

## 2012-09-24 DIAGNOSIS — Z79899 Other long term (current) drug therapy: Secondary | ICD-10-CM

## 2012-09-24 DIAGNOSIS — Z113 Encounter for screening for infections with a predominantly sexual mode of transmission: Secondary | ICD-10-CM

## 2012-09-24 DIAGNOSIS — B20 Human immunodeficiency virus [HIV] disease: Secondary | ICD-10-CM

## 2012-09-24 LAB — CBC WITH DIFFERENTIAL/PLATELET
Basophils Absolute: 0.1 10*3/uL (ref 0.0–0.1)
Basophils Relative: 1 % (ref 0–1)
Eosinophils Absolute: 0.3 10*3/uL (ref 0.0–0.7)
Eosinophils Relative: 5 % (ref 0–5)
HCT: 35.7 % — ABNORMAL LOW (ref 39.0–52.0)
Hemoglobin: 12.1 g/dL — ABNORMAL LOW (ref 13.0–17.0)
Lymphocytes Relative: 57 % — ABNORMAL HIGH (ref 12–46)
Lymphs Abs: 4.1 10*3/uL — ABNORMAL HIGH (ref 0.7–4.0)
MCH: 31 pg (ref 26.0–34.0)
MCHC: 33.9 g/dL (ref 30.0–36.0)
MCV: 91.5 fL (ref 78.0–100.0)
Monocytes Absolute: 0.7 10*3/uL (ref 0.1–1.0)
Monocytes Relative: 11 % (ref 3–12)
Neutro Abs: 1.9 10*3/uL (ref 1.7–7.7)
Neutrophils Relative %: 26 % — ABNORMAL LOW (ref 43–77)
Platelets: 311 10*3/uL (ref 150–400)
RBC: 3.9 MIL/uL — ABNORMAL LOW (ref 4.22–5.81)
RDW: 14.5 % (ref 11.5–15.5)
WBC: 7.1 10*3/uL (ref 4.0–10.5)

## 2012-09-24 LAB — COMPLETE METABOLIC PANEL WITH GFR
ALT: 24 U/L (ref 0–53)
AST: 24 U/L (ref 0–37)
Albumin: 4 g/dL (ref 3.5–5.2)
Alkaline Phosphatase: 58 U/L (ref 39–117)
BUN: 12 mg/dL (ref 6–23)
CO2: 29 mEq/L (ref 19–32)
Calcium: 9.4 mg/dL (ref 8.4–10.5)
Chloride: 105 mEq/L (ref 96–112)
Creat: 1.46 mg/dL — ABNORMAL HIGH (ref 0.50–1.35)
GFR, Est African American: 62 mL/min
GFR, Est Non African American: 53 mL/min — ABNORMAL LOW
Glucose, Bld: 95 mg/dL (ref 70–99)
Potassium: 3.8 mEq/L (ref 3.5–5.3)
Sodium: 141 mEq/L (ref 135–145)
Total Bilirubin: 0.3 mg/dL (ref 0.3–1.2)
Total Protein: 7.2 g/dL (ref 6.0–8.3)

## 2012-09-24 LAB — LIPID PANEL
Cholesterol: 123 mg/dL (ref 0–200)
HDL: 31 mg/dL — ABNORMAL LOW (ref >39)
LDL Cholesterol: 63 mg/dL (ref 0–99)
Total CHOL/HDL Ratio: 4 Ratio
Triglycerides: 143 mg/dL (ref <150)
VLDL: 29 mg/dL (ref 0–40)

## 2012-09-25 LAB — RPR

## 2012-09-25 LAB — HIV-1 RNA QUANT-NO REFLEX-BLD
HIV 1 RNA Quant: 27 copies/mL — ABNORMAL HIGH (ref <20)
HIV-1 RNA Quant, Log: 1.43 {Log} — ABNORMAL HIGH (ref <1.30)

## 2012-09-25 LAB — T-HELPER CELL (CD4) - (RCID CLINIC ONLY)
CD4 % Helper T Cell: 28 % — ABNORMAL LOW (ref 33–55)
CD4 T Cell Abs: 1090 /uL (ref 400–2700)

## 2012-10-03 ENCOUNTER — Encounter: Payer: Self-pay | Admitting: *Deleted

## 2012-10-04 NOTE — Addendum Note (Signed)
Addended by: Sherlene Shams on: 10/04/2012 01:18 PM   Modules accepted: Orders

## 2012-10-07 ENCOUNTER — Ambulatory Visit (INDEPENDENT_AMBULATORY_CARE_PROVIDER_SITE_OTHER): Payer: No Typology Code available for payment source | Admitting: Infectious Disease

## 2012-10-07 ENCOUNTER — Encounter: Payer: Self-pay | Admitting: Infectious Disease

## 2012-10-07 VITALS — BP 133/90 | HR 85 | Temp 98.3°F | Wt 220.0 lb

## 2012-10-07 DIAGNOSIS — B20 Human immunodeficiency virus [HIV] disease: Secondary | ICD-10-CM

## 2012-10-07 DIAGNOSIS — Z23 Encounter for immunization: Secondary | ICD-10-CM

## 2012-10-07 DIAGNOSIS — I10 Essential (primary) hypertension: Secondary | ICD-10-CM

## 2012-10-07 NOTE — Progress Notes (Signed)
  Subjective:    Patient ID: Adam Becker, male    DOB: October 05, 1955, 57 y.o.   MRN: [id number]  HPI   57 year old man with HIV on prezista, norvir and 400mg  of intelence once daily with TIvicay 50mg  once daily salvage regimen with good virological control and healthy cd4 count. He has no specific complaints today. He is not sexually active. He has no problems with mood. He has followed up with Dr. Darrick Huntsman for PCP. HIs BP is up a little today compared to when he saw Dr. Darrick Huntsman. His creatinine is stable back on the ACEI.   Review of Systems  Constitutional: Negative for fever, chills, diaphoresis, activity change, appetite change, fatigue and unexpected weight change.  HENT: Negative for congestion, sore throat, rhinorrhea, sneezing, trouble swallowing and sinus pressure.   Eyes: Negative for photophobia and visual disturbance.  Respiratory: Negative for cough, chest tightness, shortness of breath, wheezing and stridor.   Cardiovascular: Negative for chest pain, palpitations and leg swelling.  Gastrointestinal: Negative for nausea, vomiting, abdominal pain, diarrhea, constipation, blood in stool, abdominal distention and anal bleeding.  Genitourinary: Negative for dysuria, hematuria, flank pain and difficulty urinating.  Musculoskeletal: Negative for myalgias, back pain, joint swelling, arthralgias and gait problem.  Skin: Negative for color change, pallor, rash and wound.  Neurological: Negative for dizziness, tremors, weakness and light-headedness.  Hematological: Negative for adenopathy. Does not bruise/bleed easily.  Psychiatric/Behavioral: Negative for behavioral problems, confusion, sleep disturbance, dysphoric mood, decreased concentration and agitation.       Objective:   Physical Exam  Constitutional: He is oriented to person, place, and time. He appears well-developed and well-nourished. No distress.  HENT:  Head: Normocephalic and atraumatic.  Mouth/Throat: Oropharynx is clear  and moist. No oropharyngeal exudate.  Eyes: Conjunctivae and EOM are normal. Pupils are equal, round, and reactive to light. No scleral icterus.  Neck: Normal range of motion. Neck supple. No JVD present.  Cardiovascular: Normal rate, regular rhythm and normal heart sounds.  Exam reveals no gallop and no friction rub.   No murmur heard. Pulmonary/Chest: Effort normal and breath sounds normal. No respiratory distress. He has no wheezes. He has no rales. He exhibits no tenderness.  Abdominal: He exhibits no distension and no mass. There is no tenderness. There is no rebound and no guarding.  Musculoskeletal: He exhibits no edema and no tenderness.  Lymphadenopathy:    He has no cervical adenopathy.  Neurological: He is alert and oriented to person, place, and time. He exhibits normal muscle tone. Coordination normal.  Skin: Skin is warm and dry. He is not diaphoretic. No erythema. No pallor.  Psychiatric: He has a normal mood and affect. His behavior is normal. Judgment and thought content normal.          Assessment & Plan:  HIV: continue current salvage regimen. Consider ANY options to simplify regimen  HTN: BP improved at last visit with Dr. Darrick Huntsman  HCM: flu shot

## 2012-11-28 ENCOUNTER — Other Ambulatory Visit: Payer: Self-pay | Admitting: Infectious Disease

## 2012-12-06 ENCOUNTER — Other Ambulatory Visit: Payer: Self-pay | Admitting: Infectious Disease

## 2012-12-09 ENCOUNTER — Other Ambulatory Visit: Payer: Self-pay | Admitting: Infectious Disease

## 2012-12-14 ENCOUNTER — Other Ambulatory Visit: Payer: Self-pay | Admitting: Infectious Disease

## 2012-12-20 ENCOUNTER — Encounter (INDEPENDENT_AMBULATORY_CARE_PROVIDER_SITE_OTHER): Payer: Self-pay

## 2012-12-20 ENCOUNTER — Other Ambulatory Visit (INDEPENDENT_AMBULATORY_CARE_PROVIDER_SITE_OTHER): Payer: No Typology Code available for payment source

## 2012-12-20 DIAGNOSIS — N183 Chronic kidney disease, stage 3 unspecified: Secondary | ICD-10-CM

## 2012-12-20 DIAGNOSIS — M109 Gout, unspecified: Secondary | ICD-10-CM

## 2012-12-20 LAB — BASIC METABOLIC PANEL
BUN: 15 mg/dL (ref 6–23)
CO2: 29 mEq/L (ref 19–32)
Calcium: 9.7 mg/dL (ref 8.4–10.5)
Chloride: 104 mEq/L (ref 96–112)
Creatinine, Ser: 1.2 mg/dL (ref 0.4–1.5)
GFR: 80.48 mL/min (ref >60.00)
Glucose, Bld: 93 mg/dL (ref 70–99)
Potassium: 4.2 mEq/L (ref 3.5–5.1)
Sodium: 137 mEq/L (ref 135–145)

## 2012-12-20 LAB — URIC ACID: Uric Acid, Serum: 6.5 mg/dL (ref 4.0–7.8)

## 2012-12-30 ENCOUNTER — Telehealth: Payer: Self-pay | Admitting: Internal Medicine

## 2012-12-30 NOTE — Telephone Encounter (Signed)
Pt had another flare up of Gout and is wanting to know if something could be called into [facility] in [place]

## 2012-12-31 ENCOUNTER — Other Ambulatory Visit: Payer: Self-pay | Admitting: Internal Medicine

## 2012-12-31 MED ORDER — ALLOPURINOL 100 MG PO TABS: 200.0000 mg | ORAL_TABLET | Freq: Every day | ORAL | Status: DC

## 2012-12-31 MED ORDER — PREDNISONE (PAK) 10 MG PO TABS: ORAL_TABLET | ORAL | Status: DC

## 2012-12-31 NOTE — Telephone Encounter (Signed)
Patient stated left foot is not swollen but is waking him up and knows that it is his gout flaring up today. Patient stated can he increase the allopurinol to twice daily please advise?

## 2012-12-31 NOTE — Telephone Encounter (Signed)
Pt notified and verbalized understanding. Rx for prednisone faxed to pharmacy

## 2012-12-31 NOTE — Telephone Encounter (Signed)
He can increase the allopurinol to 200 mg daily (2 tablets daily) whichwill not help the current flare but will lower his uric  acid level.  rx for prednisone taper 6 day  Sent o pharamcy for gout flare.

## 2013-01-03 ENCOUNTER — Other Ambulatory Visit: Payer: Self-pay | Admitting: Infectious Disease

## 2013-01-06 ENCOUNTER — Other Ambulatory Visit: Payer: Self-pay | Admitting: Infectious Disease

## 2013-02-07 ENCOUNTER — Telehealth: Payer: Self-pay | Admitting: Internal Medicine

## 2013-02-07 NOTE — Telephone Encounter (Signed)
Pt came in to office requesting refill on allopurinol.  Rx is for 100 mg but pt states he is to take 200 mg daily.  States rx needs to be corrected.  He only has enough for 3 days.  [facility]

## 2013-02-07 NOTE — Telephone Encounter (Signed)
Script has been at pharmacy since 12/31/12 verified with pharmacy and patient aware.

## 2013-02-07 NOTE — Telephone Encounter (Signed)
Notified patient he has 3 refills and that 180 pills dispensed on 12/31/12 patient should still have medication

## 2013-02-07 NOTE — Telephone Encounter (Signed)
The patient is needing a new prescription the one that was sent to the pharmacy was for once a day and the patient is taking this medication twice daily.   allopurinol (ZYLOPRIM) 100 MG tablet 180 tablet 3 12/31/2012 Sig - Route: Take 2 tablets (200 mg total) by mouth daily. - Oral E-Prescribing Status: Receipt confirmed by pharmacy (12/31/2012 12:24 PM EST

## 2013-02-12 ENCOUNTER — Telehealth: Payer: Self-pay | Admitting: *Deleted

## 2013-02-26 NOTE — Telephone Encounter (Signed)
error 

## 2013-03-06 ENCOUNTER — Other Ambulatory Visit: Payer: Self-pay | Admitting: Internal Medicine

## 2013-03-06 NOTE — Telephone Encounter (Signed)
09/18/12 note states nephrologist lowered Lisinopril to low dose, his med list indicates on 5 mg?

## 2013-03-09 MED ORDER — LISINOPRIL 5 MG PO TABS: 5.0000 mg | ORAL_TABLET | Freq: Every day | ORAL | Status: DC

## 2013-03-25 ENCOUNTER — Other Ambulatory Visit: Payer: Medicare Other

## 2013-03-25 DIAGNOSIS — B20 Human immunodeficiency virus [HIV] disease: Secondary | ICD-10-CM | POA: Diagnosis not present

## 2013-03-25 LAB — CBC WITH DIFFERENTIAL/PLATELET
Basophils Absolute: 0.1 10*3/uL (ref 0.0–0.1)
Basophils Relative: 1 % (ref 0–1)
Eosinophils Absolute: 0.3 10*3/uL (ref 0.0–0.7)
Eosinophils Relative: 4 % (ref 0–5)
HCT: 44.1 % (ref 39.0–52.0)
Hemoglobin: 15.3 g/dL (ref 13.0–17.0)
Lymphocytes Relative: 62 % — ABNORMAL HIGH (ref 12–46)
Lymphs Abs: 5 10*3/uL — ABNORMAL HIGH (ref 0.7–4.0)
MCH: 32.1 pg (ref 26.0–34.0)
MCHC: 34.7 g/dL (ref 30.0–36.0)
MCV: 92.5 fL (ref 78.0–100.0)
Monocytes Absolute: 0.8 10*3/uL (ref 0.1–1.0)
Monocytes Relative: 10 % (ref 3–12)
Neutro Abs: 1.9 10*3/uL (ref 1.7–7.7)
Neutrophils Relative %: 23 % — ABNORMAL LOW (ref 43–77)
Platelets: 475 10*3/uL — ABNORMAL HIGH (ref 150–400)
RBC: 4.77 MIL/uL (ref 4.22–5.81)
RDW: 14.9 % (ref 11.5–15.5)
WBC: 8.1 10*3/uL (ref 4.0–10.5)

## 2013-03-26 LAB — COMPLETE METABOLIC PANEL WITH GFR
ALT: 33 U/L (ref 0–53)
AST: 26 U/L (ref 0–37)
Albumin: 4.5 g/dL (ref 3.5–5.2)
Alkaline Phosphatase: 75 U/L (ref 39–117)
BUN: 16 mg/dL (ref 6–23)
CO2: 28 mEq/L (ref 19–32)
Calcium: 10.2 mg/dL (ref 8.4–10.5)
Chloride: 103 mEq/L (ref 96–112)
Creat: 1.19 mg/dL (ref 0.50–1.35)
GFR, Est African American: 78 mL/min
GFR, Est Non African American: 68 mL/min
Glucose, Bld: 78 mg/dL (ref 70–99)
Potassium: 4.4 mEq/L (ref 3.5–5.3)
Sodium: 139 mEq/L (ref 135–145)
Total Bilirubin: 0.3 mg/dL (ref 0.2–1.2)
Total Protein: 8.6 g/dL — ABNORMAL HIGH (ref 6.0–8.3)

## 2013-03-26 LAB — T-HELPER CELL (CD4) - (RCID CLINIC ONLY)
CD4 % Helper T Cell: 24 % — ABNORMAL LOW (ref 33–55)
CD4 T Cell Abs: 1120 /uL (ref 400–2700)

## 2013-03-27 LAB — HIV-1 RNA QUANT-NO REFLEX-BLD
HIV 1 RNA Quant: <20 copies/mL (ref <20)
HIV-1 RNA Quant, Log: <1.3 {Log} (ref <1.30)

## 2013-04-08 ENCOUNTER — Ambulatory Visit (INDEPENDENT_AMBULATORY_CARE_PROVIDER_SITE_OTHER): Payer: No Typology Code available for payment source | Admitting: Infectious Disease

## 2013-04-08 ENCOUNTER — Encounter: Payer: Self-pay | Admitting: Infectious Disease

## 2013-04-08 VITALS — BP 141/93 | HR 71 | Temp 98.1°F | Ht 71.0 in | Wt 227.0 lb

## 2013-04-08 DIAGNOSIS — Q8901 Asplenia (congenital): Secondary | ICD-10-CM

## 2013-04-08 DIAGNOSIS — B20 Human immunodeficiency virus [HIV] disease: Secondary | ICD-10-CM

## 2013-04-08 DIAGNOSIS — I10 Essential (primary) hypertension: Secondary | ICD-10-CM

## 2013-04-08 DIAGNOSIS — Q8909 Congenital malformations of spleen: Secondary | ICD-10-CM

## 2013-04-08 DIAGNOSIS — E785 Hyperlipidemia, unspecified: Secondary | ICD-10-CM

## 2013-04-08 NOTE — Progress Notes (Signed)
  Subjective:    Patient ID: Adam Becker, male    DOB: [birth year], 58 y.o.   MRN: [id number]  HPI   58 year old man with HIV on prezista, norvir and 400mg  of intelence once daily with TIvicay 50mg  once daily salvage regimen with good virological control and healthy cd4 count. He has no specific complaints today. He is not sexually active. . He has followed up with Dr. Derrel Nip for PCP. HIs BP is up a little today compared to when he saw Dr. Derrel Nip. WE talked about going to [facility] once Medicare starts paying for it.  Review of Systems  Constitutional: Negative for fever, chills, diaphoresis, activity change, appetite change, fatigue and unexpected weight change.  HENT: Negative for congestion, rhinorrhea, sinus pressure, sneezing, sore throat and trouble swallowing.   Eyes: Negative for photophobia and visual disturbance.  Respiratory: Negative for cough, chest tightness, shortness of breath, wheezing and stridor.   Cardiovascular: Negative for chest pain, palpitations and leg swelling.  Gastrointestinal: Negative for nausea, vomiting, abdominal pain, diarrhea, constipation, blood in stool, abdominal distention and anal bleeding.  Genitourinary: Negative for dysuria, hematuria, flank pain and difficulty urinating.  Musculoskeletal: Negative for arthralgias, back pain, gait problem, joint swelling and myalgias.  Skin: Negative for color change, pallor, rash and wound.  Neurological: Negative for dizziness, tremors, weakness and light-headedness.  Hematological: Negative for adenopathy. Does not bruise/bleed easily.  Psychiatric/Behavioral: Negative for behavioral problems, confusion, sleep disturbance, dysphoric mood, decreased concentration and agitation.       Objective:   Physical Exam  Constitutional: He is oriented to person, place, and time. He appears well-developed and well-nourished. No distress.  HENT:  Head: Normocephalic and atraumatic.  Mouth/Throat: Oropharynx is clear  and moist. No oropharyngeal exudate.  Eyes: Conjunctivae and EOM are normal. Pupils are equal, round, and reactive to light. No scleral icterus.  Neck: Normal range of motion. Neck supple. No JVD present.  Cardiovascular: Normal rate, regular rhythm and normal heart sounds.  Exam reveals no gallop and no friction rub.   No murmur heard. Pulmonary/Chest: Effort normal and breath sounds normal. No respiratory distress. He has no wheezes. He has no rales. He exhibits no tenderness.  Abdominal: He exhibits no distension and no mass. There is no tenderness. There is no rebound and no guarding.  Musculoskeletal: He exhibits no edema and no tenderness.  Lymphadenopathy:    He has no cervical adenopathy.  Neurological: He is alert and oriented to person, place, and time. He exhibits normal muscle tone. Coordination normal.  Skin: Skin is warm and dry. He is not diaphoretic. No erythema. No pallor.  Psychiatric: He has a normal mood and affect. His behavior is normal. Judgment and thought content normal.          Assessment & Plan:  HIV: continue current salvage regimen. Change from PRZ/RTV to Prezcobix, Tivicay and Intelence  HTN: followed with Dr. Derrel Nip  Hyperlipidemia: at goal   HCM: flu shot  Asplenia: Needs redose of menactra and will give this at next visit.

## 2013-05-30 ENCOUNTER — Telehealth: Payer: Self-pay | Admitting: *Deleted

## 2013-05-30 ENCOUNTER — Telehealth: Payer: Self-pay | Admitting: Internal Medicine

## 2013-05-30 DIAGNOSIS — M109 Gout, unspecified: Secondary | ICD-10-CM

## 2013-05-30 MED ORDER — PREDNISONE (PAK) 10 MG PO TABS: ORAL_TABLET | ORAL | Status: DC

## 2013-05-30 MED ORDER — ALLOPURINOL 100 MG PO TABS: 200.0000 mg | ORAL_TABLET | Freq: Every day | ORAL | Status: DC

## 2013-05-30 NOTE — Telephone Encounter (Signed)
Refill sent.

## 2013-05-30 NOTE — Telephone Encounter (Signed)
Needing medication for Gout called to the pharmacy.

## 2013-05-30 NOTE — Telephone Encounter (Signed)
Okay to refill? 

## 2013-05-30 NOTE — Telephone Encounter (Signed)
If he is wanting refil on the prednisone for gout,  Ok to fill .

## 2013-05-30 NOTE — Telephone Encounter (Signed)
Refill Request  Prednisone 10 mg tab   #21  Take as directed

## 2013-06-03 ENCOUNTER — Other Ambulatory Visit: Payer: Self-pay | Admitting: *Deleted

## 2013-06-03 DIAGNOSIS — M109 Gout, unspecified: Secondary | ICD-10-CM

## 2013-06-06 ENCOUNTER — Other Ambulatory Visit: Payer: Self-pay | Admitting: Internal Medicine

## 2013-06-06 NOTE — Telephone Encounter (Signed)
Ok to refill prednisone 

## 2013-06-10 NOTE — Telephone Encounter (Signed)
Need to know prednisoen refill is requested.

## 2013-06-15 ENCOUNTER — Other Ambulatory Visit: Payer: Self-pay | Admitting: Infectious Disease

## 2013-07-21 ENCOUNTER — Encounter: Payer: Self-pay | Admitting: *Deleted

## 2013-08-11 ENCOUNTER — Encounter: Payer: Self-pay | Admitting: Internal Medicine

## 2013-09-04 ENCOUNTER — Other Ambulatory Visit: Payer: Self-pay | Admitting: Internal Medicine

## 2013-09-10 ENCOUNTER — Encounter: Payer: Self-pay | Admitting: Internal Medicine

## 2013-10-01 ENCOUNTER — Other Ambulatory Visit: Payer: Self-pay | Admitting: Internal Medicine

## 2013-10-22 ENCOUNTER — Other Ambulatory Visit (HOSPITAL_COMMUNITY)
Admission: RE | Admit: 2013-10-22 | Discharge: 2013-10-22 | Disposition: A | Discharge status: Home / Self Care | Payer: Medicare Other | Source: Ambulatory Visit | Attending: Infectious Diseases | Admitting: Infectious Diseases

## 2013-10-22 ENCOUNTER — Other Ambulatory Visit: Payer: Medicare Other

## 2013-10-22 DIAGNOSIS — E785 Hyperlipidemia, unspecified: Secondary | ICD-10-CM

## 2013-10-22 DIAGNOSIS — Z113 Encounter for screening for infections with a predominantly sexual mode of transmission: Secondary | ICD-10-CM | POA: Insufficient documentation

## 2013-10-22 DIAGNOSIS — Z79899 Other long term (current) drug therapy: Secondary | ICD-10-CM

## 2013-10-22 DIAGNOSIS — B20 Human immunodeficiency virus [HIV] disease: Secondary | ICD-10-CM | POA: Diagnosis not present

## 2013-10-22 LAB — CBC WITH DIFFERENTIAL/PLATELET
Basophils Absolute: 0.1 10*3/uL (ref 0.0–0.1)
Basophils Relative: 1 % (ref 0–1)
Eosinophils Absolute: 0.3 10*3/uL (ref 0.0–0.7)
Eosinophils Relative: 5 % (ref 0–5)
HCT: 40.9 % (ref 39.0–52.0)
Hemoglobin: 14.1 g/dL (ref 13.0–17.0)
Lymphocytes Relative: 58 % — ABNORMAL HIGH (ref 12–46)
Lymphs Abs: 3.5 10*3/uL (ref 0.7–4.0)
MCH: 31.9 pg (ref 26.0–34.0)
MCHC: 34.5 g/dL (ref 30.0–36.0)
MCV: 92.5 fL (ref 78.0–100.0)
Monocytes Absolute: 0.7 10*3/uL (ref 0.1–1.0)
Monocytes Relative: 11 % (ref 3–12)
Neutro Abs: 1.5 10*3/uL — ABNORMAL LOW (ref 1.7–7.7)
Neutrophils Relative %: 25 % — ABNORMAL LOW (ref 43–77)
Platelets: 370 10*3/uL (ref 150–400)
RBC: 4.42 MIL/uL (ref 4.22–5.81)
RDW: 14.7 % (ref 11.5–15.5)
WBC: 6.1 10*3/uL (ref 4.0–10.5)

## 2013-10-22 LAB — COMPLETE METABOLIC PANEL WITH GFR
ALT: 26 U/L (ref 0–53)
AST: 28 U/L (ref 0–37)
Albumin: 4.3 g/dL (ref 3.5–5.2)
Alkaline Phosphatase: 60 U/L (ref 39–117)
BUN: 12 mg/dL (ref 6–23)
CO2: 25 mEq/L (ref 19–32)
Calcium: 9.7 mg/dL (ref 8.4–10.5)
Chloride: 105 mEq/L (ref 96–112)
Creat: 1.28 mg/dL (ref 0.50–1.35)
GFR, Est African American: 71 mL/min
GFR, Est Non African American: 62 mL/min
Glucose, Bld: 93 mg/dL (ref 70–99)
Potassium: 4.1 mEq/L (ref 3.5–5.3)
Sodium: 140 mEq/L (ref 135–145)
Total Bilirubin: 0.6 mg/dL (ref 0.2–1.2)
Total Protein: 7.6 g/dL (ref 6.0–8.3)

## 2013-10-22 LAB — LIPID PANEL
Cholesterol: 163 mg/dL (ref 0–200)
HDL: 41 mg/dL (ref >39)
LDL Cholesterol: 100 mg/dL — ABNORMAL HIGH (ref 0–99)
Total CHOL/HDL Ratio: 4 Ratio
Triglycerides: 111 mg/dL (ref <150)
VLDL: 22 mg/dL (ref 0–40)

## 2013-10-22 LAB — RPR: RPR Ser Ql: REACTIVE — AB

## 2013-10-22 LAB — RPR TITER: RPR Titer: 1:1 {titer}

## 2013-10-22 LAB — HEPATITIS C ANTIBODY: HCV Ab: NEGATIVE

## 2013-10-23 ENCOUNTER — Telehealth: Payer: Self-pay

## 2013-10-23 DIAGNOSIS — M10371 Gout due to renal impairment, right ankle and foot: Secondary | ICD-10-CM

## 2013-10-23 LAB — MICROALBUMIN / CREATININE URINE RATIO
Creatinine, Urine: 137 mg/dL
Microalb Creat Ratio: 48.2 mg/g — ABNORMAL HIGH (ref 0.0–30.0)
Microalb, Ur: 6.6 mg/dL — ABNORMAL HIGH (ref <2.0)

## 2013-10-23 LAB — URINE CYTOLOGY ANCILLARY ONLY
Chlamydia: NEGATIVE
Neisseria Gonorrhea: NEGATIVE

## 2013-10-23 LAB — HIV-1 RNA QUANT-NO REFLEX-BLD
HIV 1 RNA Quant: <20 copies/mL (ref <20)
HIV-1 RNA Quant, Log: <1.3 {Log} (ref <1.30)

## 2013-10-23 LAB — T-HELPER CELL (CD4) - (RCID CLINIC ONLY)
CD4 % Helper T Cell: 24 % — ABNORMAL LOW (ref 33–55)
CD4 T Cell Abs: 920 /uL (ref 400–2700)

## 2013-10-23 LAB — FLUORESCENT TREPONEMAL AB(FTA)-IGG-BLD: Fluorescent Treponemal ABS: REACTIVE — AB

## 2013-10-23 MED ORDER — PREDNISONE (PAK) 10 MG PO TABS: ORAL_TABLET | ORAL | Status: DC

## 2013-10-23 NOTE — Telephone Encounter (Signed)
The patient called and is hoping to get a prescription of prednisone called in for a gout flare up he is experiencing.

## 2013-10-23 NOTE — Telephone Encounter (Signed)
Patient stated that his  right great toe is swollen and hurting in the joint patient stated that he had a couple of episodes per Year. Last prednisone taper was in May.

## 2013-10-23 NOTE — Telephone Encounter (Signed)
Please ask patient to transfer calls like this to Triage or to [facility]. I  Cannot call in medications without more info

## 2013-10-23 NOTE — Telephone Encounter (Signed)
[facility] [place]

## 2013-10-23 NOTE — Telephone Encounter (Signed)
Tried to reach patient for Symptoms unable to reach Please advise

## 2013-10-23 NOTE — Telephone Encounter (Signed)
Pt notified and verbalized understanding.

## 2013-10-24 ENCOUNTER — Ambulatory Visit (AMBULATORY_SURGERY_CENTER): Payer: Medicare Other | Admitting: *Deleted

## 2013-10-24 VITALS — Ht 71.0 in | Wt 230.2 lb

## 2013-10-24 DIAGNOSIS — Z800 Family history of malignant neoplasm of digestive organs: Secondary | ICD-10-CM

## 2013-10-24 MED ORDER — MOVIPREP 100 G PO SOLR: 1.0000 | Freq: Once | ORAL | Status: DC

## 2013-10-24 NOTE — Progress Notes (Signed)
No home 02 use. ewm No egg or soy allergy. ewm No problems with past sedation. ewm emmi video to pt's e mail. ewm

## 2013-10-30 ENCOUNTER — Other Ambulatory Visit: Payer: Self-pay | Admitting: Internal Medicine

## 2013-11-02 ENCOUNTER — Other Ambulatory Visit: Payer: Self-pay | Admitting: Internal Medicine

## 2013-11-05 ENCOUNTER — Encounter: Payer: Self-pay | Admitting: Infectious Disease

## 2013-11-05 ENCOUNTER — Ambulatory Visit (INDEPENDENT_AMBULATORY_CARE_PROVIDER_SITE_OTHER): Payer: No Typology Code available for payment source | Admitting: Infectious Disease

## 2013-11-05 VITALS — BP 152/102 | HR 63 | Temp 98.1°F | Wt 233.0 lb

## 2013-11-05 DIAGNOSIS — I10 Essential (primary) hypertension: Secondary | ICD-10-CM

## 2013-11-05 DIAGNOSIS — Q8901 Asplenia (congenital): Secondary | ICD-10-CM

## 2013-11-05 DIAGNOSIS — E785 Hyperlipidemia, unspecified: Secondary | ICD-10-CM

## 2013-11-05 DIAGNOSIS — B20 Human immunodeficiency virus [HIV] disease: Secondary | ICD-10-CM

## 2013-11-05 DIAGNOSIS — Z23 Encounter for immunization: Secondary | ICD-10-CM

## 2013-11-05 NOTE — Progress Notes (Signed)
  Subjective:    Patient ID: Adam Becker, male    DOB: 25-Nov-1955, 58 y.o.   MRN: [id number]  HPI   58 year old man with HIV on prezista, norvir and 400mg  of intelence once daily with TIvicay 50mg  once daily salvage regimen with good virological control and healthy cd4 count. He has no specific complaints today. He is not sexually active. . He has followed up with Dr. Derrel Nip for PCP. HIs BP is up a little today compared to when he saw Dr. Derrel Nip. WE talked about going to PREZCOBIX once Medicare starts paying for it, but today he preferred to just stay on current regimen.  Lab Results  Component Value Date   HIV1RNAQUANT <20 10/22/2013   Lab Results  Component Value Date   CD4TABS 920 10/22/2013   CD4TABS 1120 03/25/2013   CD4TABS 1090 09/24/2012     Review of Systems  Constitutional: Negative for fever, chills, diaphoresis, activity change, appetite change, fatigue and unexpected weight change.  HENT: Negative for congestion, rhinorrhea, sinus pressure, sneezing, sore throat and trouble swallowing.   Eyes: Negative for photophobia and visual disturbance.  Respiratory: Negative for cough, chest tightness, shortness of breath, wheezing and stridor.   Cardiovascular: Negative for chest pain, palpitations and leg swelling.  Gastrointestinal: Negative for nausea, vomiting, abdominal pain, diarrhea, constipation, blood in stool, abdominal distention and anal bleeding.  Genitourinary: Negative for dysuria, hematuria, flank pain and difficulty urinating.  Musculoskeletal: Negative for arthralgias, back pain, gait problem, joint swelling and myalgias.  Skin: Negative for color change, pallor, rash and wound.  Neurological: Negative for dizziness, tremors, weakness and light-headedness.  Hematological: Negative for adenopathy. Does not bruise/bleed easily.  Psychiatric/Behavioral: Negative for behavioral problems, confusion, sleep disturbance, dysphoric mood, decreased concentration and agitation.        Objective:   Physical Exam  Constitutional: He is oriented to person, place, and time. He appears well-developed and well-nourished. No distress.  HENT:  Head: Normocephalic and atraumatic.  Mouth/Throat: Oropharynx is clear and moist. No oropharyngeal exudate.  Eyes: Conjunctivae and EOM are normal. Pupils are equal, round, and reactive to light. No scleral icterus.  Neck: Normal range of motion. Neck supple. No JVD present.  Cardiovascular: Normal rate, regular rhythm and normal heart sounds.  Exam reveals no gallop and no friction rub.   No murmur heard. Pulmonary/Chest: Effort normal and breath sounds normal. No respiratory distress. He has no wheezes. He has no rales. He exhibits no tenderness.  Abdominal: He exhibits no distension and no mass. There is no tenderness. There is no rebound and no guarding.  Musculoskeletal: He exhibits no edema and no tenderness.  Lymphadenopathy:    He has no cervical adenopathy.  Neurological: He is alert and oriented to person, place, and time. He exhibits normal muscle tone. Coordination normal.  Skin: Skin is warm and dry. He is not diaphoretic. No erythema. No pallor.  Psychiatric: He has a normal mood and affect. His behavior is normal. Judgment and thought content normal.          Assessment & Plan:  HIV: continue current salvage regimen. Change from PRZ/RTV  (reconsider change to Prezcobix in future) Tivicay and Intelence  HTN: followed with Dr. Derrel Nip  Hyperlipidemia: at goal   HCM: flu shot  Asplenia: still Needs redose of menactra and neglected to give this today again

## 2013-11-07 ENCOUNTER — Encounter: Payer: Self-pay | Admitting: Internal Medicine

## 2013-11-07 ENCOUNTER — Ambulatory Visit (AMBULATORY_SURGERY_CENTER): Payer: Medicare Other | Admitting: Internal Medicine

## 2013-11-07 VITALS — BP 124/74 | HR 72 | Temp 96.8°F | Resp 19 | Ht 71.0 in | Wt 230.0 lb

## 2013-11-07 DIAGNOSIS — Z800 Family history of malignant neoplasm of digestive organs: Secondary | ICD-10-CM

## 2013-11-07 DIAGNOSIS — Z1211 Encounter for screening for malignant neoplasm of colon: Secondary | ICD-10-CM

## 2013-11-07 DIAGNOSIS — D125 Benign neoplasm of sigmoid colon: Secondary | ICD-10-CM | POA: Diagnosis not present

## 2013-11-07 DIAGNOSIS — K635 Polyp of colon: Secondary | ICD-10-CM

## 2013-11-07 LAB — HM COLONOSCOPY

## 2013-11-07 MED ORDER — SODIUM CHLORIDE 0.9 % IV SOLN: 500.0000 mL | INTRAVENOUS | Status: DC

## 2013-11-07 NOTE — Patient Instructions (Signed)
YOU HAD AN ENDOSCOPIC PROCEDURE TODAY AT THE [facility]: Refer to the procedure report that was given to you for any specific questions about what was found during the examination.  If the procedure report does not answer your questions, please call your gastroenterologist to clarify.  If you requested that your care partner not be given the details of your procedure findings, then the procedure report has been included in a sealed envelope for you to review at your convenience later.  YOU SHOULD EXPECT: Some feelings of bloating in the abdomen. Passage of more gas than usual.  Walking can help get rid of the air that was put into your GI tract during the procedure and reduce the bloating. If you had a lower endoscopy (such as a colonoscopy or flexible sigmoidoscopy) you may notice spotting of blood in your stool or on the toilet paper. If you underwent a bowel prep for your procedure, then you may not have a normal bowel movement for a few days.  DIET: Your first meal following the procedure should be a light meal and then it is ok to progress to your normal diet.  A half-sandwich or bowl of soup is an example of a good first meal.  Heavy or fried foods are harder to digest and may make you feel nauseous or bloated.  Likewise meals heavy in dairy and vegetables can cause extra gas to form and this can also increase the bloating.  Drink plenty of fluids but you should avoid alcoholic beverages for 24 hours.  ACTIVITY: Your care partner should take you home directly after the procedure.  You should plan to take it easy, moving slowly for the rest of the day.  You can resume normal activity the day after the procedure however you should NOT DRIVE or use heavy machinery for 24 hours (because of the sedation medicines used during the test).    SYMPTOMS TO REPORT IMMEDIATELY: A gastroenterologist can be reached at any hour.  During normal business hours, 8:30 AM to 5:00 PM Monday through Friday,  call [phone].  After hours and on weekends, please call the GI answering service at [phone] who will take a message and have the physician on call contact you.   Following lower endoscopy (colonoscopy or flexible sigmoidoscopy):  Excessive amounts of blood in the stool  Significant tenderness or worsening of abdominal pains  Swelling of the abdomen that is new, acute  Fever of 100F or higher  FOLLOW UP: If any biopsies were taken you will be contacted by phone or by letter within the next 1-3 weeks.  Call your gastroenterologist if you have not heard about the biopsies in 3 weeks.  Our staff will call the home number listed on your records the next business day following your procedure to check on you and address any questions or concerns that you may have at that time regarding the information given to you following your procedure. This is a courtesy call and so if there is no answer at the home number and we have not heard from you through the emergency physician on call, we will assume that you have returned to your regular daily activities without incident.  SIGNATURES/CONFIDENTIALITY: You and/or your care partner have signed paperwork which will be entered into your electronic medical record.  These signatures attest to the fact that that the information above on your After Visit Summary has been reviewed and is understood.  Full responsibility of the confidentiality of this   discharge information lies with you and/or your care-partner.  Recommendations Next colonoscopy in 5 years. Polyp handout provided to patient/caregiver

## 2013-11-07 NOTE — Progress Notes (Signed)
Called to room to assist during endoscopic procedure.  Patient ID and intended procedure confirmed with present staff. Received instructions for my participation in the procedure from the performing physician.  

## 2013-11-07 NOTE — Progress Notes (Signed)
Procedure ends, to recovery, report given and VSS. 

## 2013-11-07 NOTE — Op Note (Signed)
[place]  [address] [place], [id number]   COLONOSCOPY PROCEDURE REPORT  PATIENT: Adam Becker, Adam Becker  MR#: [id number] BIRTHDATE: 1955-04-10 , 67  yrs. old GENDER: male ENDOSCOPIST: Lafayette Dragon, MD REFERRED [id number] Derrel Nip, M.D. PROCEDURE DATE:  11/07/2013 PROCEDURE:   Colonoscopy with cold biopsy polypectomy First Screening Colonoscopy - Avg.  risk and is 50 yrs.  old or older - No.  Prior Negative Screening - Now for repeat screening. N/A Prior Negative Screening - Now for repeat screening.  Less than 10 yrs Prior Negative Screening - Now for repeat screening.  Above average risk  History of Adenoma - Now for follow-up colonoscopy  has been > or = to 3 yrs.  Polyps Removed Today? Yes. ASA CLASS:   Class I INDICATIONS:prior colonoscopy in September 2010.,father with colon cancer MEDICATIONS: Monitored anesthesia care and Propofol 200 mg IV  DESCRIPTION OF PROCEDURE:   After the risks benefits and alternatives of the procedure were thoroughly explained, informed consent was obtained.  The digital rectal exam revealed no abnormalities of the rectum.   The LB [id number]  endoscope was introduced through the anus and advanced to the cecum, which was identified by both the appendix and ileocecal valve. No adverse events experienced.   The quality of the prep was good, using MoviPrep  The instrument was then slowly withdrawn as the colon was fully examined.      COLON FINDINGS: A smooth sessile polyp measuring 4 mm in size was found in the sigmoid colon.  A polypectomy was performed with cold forceps.  The resection was complete, the polyp tissue was completely retrieved and sent to histology.  Retroflexed views revealed no abnormalities. The time to cecum=3 minutes 21 seconds. Withdrawal time=8 minutes 30 seconds.  The scope was withdrawn and the procedure completed. COMPLICATIONS: There were no immediate complications.  ENDOSCOPIC  IMPRESSION: Sessile polyp was found in the sigmoid colon; polypectomy was performed with cold forceps  RECOMMENDATIONS: Await pathology results recall colonoscopy in 5 years  eSigned:  Lafayette Dragon, MD 11/07/2013 12:40 PM   cc:

## 2013-11-07 NOTE — Progress Notes (Signed)
Dr. Olevia Perches informed that the patient ate green beans and cabbage for lunch on 11/06/13. Patient stating he is clear past prep. Rechecked bp at 1200 left manually 136/90.

## 2013-11-10 ENCOUNTER — Telehealth: Payer: Self-pay | Admitting: *Deleted

## 2013-11-10 ENCOUNTER — Telehealth: Payer: Self-pay | Admitting: Internal Medicine

## 2013-11-10 NOTE — Telephone Encounter (Signed)
The patient is completely out of medication.   allopurinol (ZYLOPRIM) 100 MG tablet

## 2013-11-10 NOTE — Telephone Encounter (Signed)
  Follow up Call-  Call back number 11/07/2013  Post procedure Call Back phone  # [phone]  Permission to leave phone message Yes     Patient questions:  Do you have a fever, pain , or abdominal swelling? No. Pain Score  0 *  Have you tolerated food without any problems? Yes.    Have you been able to return to your normal activities? Yes.    Do you have any questions about your discharge instructions: Diet   No. Medications  No. Follow up visit  No.  Do you have questions or concerns about your Care? No.  Actions: * If pain score is 4 or above: No action needed, pain <4.

## 2013-11-10 NOTE — Telephone Encounter (Signed)
Incorrect medication requested.

## 2013-11-11 ENCOUNTER — Encounter: Payer: Self-pay | Admitting: Internal Medicine

## 2013-11-11 ENCOUNTER — Ambulatory Visit (INDEPENDENT_AMBULATORY_CARE_PROVIDER_SITE_OTHER): Payer: No Typology Code available for payment source | Admitting: Internal Medicine

## 2013-11-11 VITALS — BP 144/90 | HR 86 | Temp 98.5°F | Resp 18 | Ht 71.0 in | Wt 231.5 lb

## 2013-11-11 DIAGNOSIS — I10 Essential (primary) hypertension: Secondary | ICD-10-CM

## 2013-11-11 DIAGNOSIS — E785 Hyperlipidemia, unspecified: Secondary | ICD-10-CM

## 2013-11-11 DIAGNOSIS — N182 Chronic kidney disease, stage 2 (mild): Secondary | ICD-10-CM

## 2013-11-11 MED ORDER — LISINOPRIL 10 MG PO TABS: 10.0000 mg | ORAL_TABLET | Freq: Every day | ORAL | Status: DC

## 2013-11-11 NOTE — Patient Instructions (Signed)
I am increasing your lisinopril dose to 10 mg daily   Please return for a nurse visit in one month to have your BP checked .  I will see you again in May/June   You have a viral  Syndrome .  The post nasal drip is causing your sore throat.  Lavage your sinuses twice daily with Simply saline nasal spray.  Use benadryl 25 mg every 8 hours for the post nasal drip and Afrin nasal spray every 12 hours  as needed for the congestion.  Gargle with salt water as needed for the sore throat.  Delsym is available OTC as a cough syrup  If the throat is no better  In 3 to 4 days OR  if you develop T > 100.4,  Green nasal discharge,  Or facial pain,  Call for an antibiotic.

## 2013-11-11 NOTE — Progress Notes (Signed)
Pre-visit discussion using our clinic review tool. No additional management support is needed unless otherwise documented below in the visit note.  

## 2013-11-11 NOTE — Progress Notes (Signed)
Patient ID: Adam Becker, male   DOB: April 07, 1955, 58 y.o.   MRN: [id number]   Patient Active Problem List   Diagnosis Date Noted  . 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 Gout attack 09/20/2012  . Obesity (BMI 30.0-34.9) 12/09/2011  . Hyperlipidemia 11/01/2009  . NEUROSYPHILIS 03/11/2009  . PARESTHESIA, HANDS 06/18/2008  . LATE SYPHILIS, LATENT 10/30/2007  . ERECTILE DYSFUNCTION, MILD 12/31/2006  . HYPERTENSION, BENIGN ESSENTIAL 10/15/2006  . IMPAIRED RENAL FUNCTION NOS 10/15/2006  . Human immunodeficiency virus (HIV) disease 04/16/2006    Subjective:  CC:   Chief Complaint  Patient presents with  . Follow-up    Medication,    HPI:   Adam Becker is a 58 y.o. male who presents for  follow up on chronic conditions including hypertension with CKD and hyperlipidemia, HIV with history of neurosyphilis.  He has been experiencing a Viral  syndrome since Sunday.  She ran out of BP medications several weeks ago,    Past Medical History  Diagnosis Date  . HIV infection   . Hypertension   . Hyperlipidemia   . Chronic kidney disease Nov 2013    admission for [facility] cr 11, acidosis    Past Surgical History  Procedure Laterality Date  . Splenectomy  2000    reason unclear "stopped working"  . Colonoscopy         The following portions of the patient's history were reviewed and updated as appropriate: Allergies, current medications, and problem list.    Review of Systems:   Patient denies headache, fevers, malaise, unintentional weight loss, skin rash, eye pain, sinus congestion and sinus pain, sore throat, dysphagia,  hemoptysis , cough, dyspnea, wheezing, chest pain, palpitations, orthopnea, edema, abdominal pain, nausea, melena, diarrhea, constipation, flank pain, dysuria, hematuria, urinary  Frequency, nocturia, numbness, tingling, seizures,  Focal weakness, Loss of consciousness,  Tremor, insomnia, depression, anxiety, and suicidal ideation.      History   Social History  . Marital Status: Single    Spouse Name: N/A    Number of Children: N/A  . Years of Education: N/A   Occupational History  . Not on file.   Social History Main Topics  . Smoking status: Never Smoker   . Smokeless tobacco: Never Used  . Alcohol Use: No  . Drug Use: No  . Sexual Activity: Not Currently     Comment: declined condoms   Other Topics Concern  . Not on file   Social History Narrative  . No narrative on file    Objective:  Filed Vitals:   11/11/13 1302  BP: 144/90  Pulse: 86  Temp: 98.5 F (36.9 C)  Resp: 18     General appearance: alert, cooperative and appears stated age Ears: normal TM's and external ear canals both ears Throat: lips, mucosa, and tongue normal; teeth and gums normal Neck: no adenopathy, no carotid bruit, supple, symmetrical, trachea midline and thyroid not enlarged, symmetric, no tenderness/mass/nodules Back: symmetric, no curvature. ROM normal. No CVA tenderness. Lungs: clear to auscultation bilaterally Heart: regular rate and rhythm, S1, S2 normal, no murmur, click, rub or gallop Abdomen: soft, non-tender; bowel sounds normal; no masses,  no organomegaly Pulses: 2+ and symmetric Skin: Skin color, texture, turgor normal. No rashes or lesions Lymph nodes: Cervical, supraclavicular, and axillary nodes normal.  Assessment and Plan:  HYPERTENSION, BENIGN ESSENTIAL Resume lisinopril.  Renal functio is improved.    Lab Results  Component Value Date   CREATININE 1.28 10/22/2013  Lab Results  Component Value Date   NA 140 10/22/2013   K 4.1 10/22/2013   CL 105 10/22/2013   CO2 25 10/22/2013     Chronic kidney disease (CKD), stage II (mild) Renal function has nearly normalized.   Hyperlipidemia Now managed with pravastatin for low HDL mildly elevated LDL. Last LDL was 128 in January. He was not fasting today,  And will have a repea tfasting panel with next set of labs with ID followup  Lab  Results  Component Value Date   CHOL 163 10/22/2013   HDL 41 10/22/2013   LDLCALC 100* 10/22/2013   TRIG 111 10/22/2013   CHOLHDL 4.0 10/22/2013   Lab Results  Component Value Date   ALT 26 10/22/2013   AST 28 10/22/2013   ALKPHOS 60 10/22/2013   BILITOT 0.6 10/22/2013      Updated Medication List Outpatient Encounter Prescriptions as of 11/11/2013  Medication Sig  . allopurinol (ZYLOPRIM) 100 MG tablet Take 2 tablets (200 mg total) by mouth daily.  . INTELENCE 200 MG TABS TAKE 2 TABLETS BY MOUTH DAILY  . lisinopril (PRINIVIL,ZESTRIL) 10 MG tablet Take 1 tablet (10 mg total) by mouth daily. NEEDS APPT BEFORE 10/31/13 FOR FURTHER REFILLS  . NORVIR 100 MG capsule TAKE 1 CAPSULE BY MOUTH EVERY DAY  . pravastatin (PRAVACHOL) 40 MG tablet TAKE 1 TABLET BY MOUTH EVERY DAY  . PREZISTA 800 MG tablet TAKE 1 TABLET BY MOUTH DAILY  . TIVICAY 50 MG tablet TAKE 1 TABLET BY MOUTH DAILY  . [DISCONTINUED] lisinopril (PRINIVIL,ZESTRIL) 5 MG tablet Take 1 tablet (5 mg total) by mouth daily. NEEDS APPT BEFORE 10/31/13 FOR FURTHER REFILLS  . predniSONE (STERAPRED UNI-PAK) 10 MG tablet      Orders Placed This Encounter  Procedures  . [facility] COLONOSCOPY    Return in about 4 weeks (around 12/09/2013).

## 2013-11-12 NOTE — Assessment & Plan Note (Signed)
Resume lisinopril.  Renal functio is improved.    Lab Results  Component Value Date   CREATININE 1.28 10/22/2013   Lab Results  Component Value Date   NA 140 10/22/2013   K 4.1 10/22/2013   CL 105 10/22/2013   CO2 25 10/22/2013

## 2013-11-12 NOTE — Assessment & Plan Note (Signed)
Renal function has nearly normalized.

## 2013-11-12 NOTE — Assessment & Plan Note (Signed)
Now managed with pravastatin for low HDL mildly elevated LDL. Last LDL was 128 in January. He was not fasting today,  And will have a repea tfasting panel with next set of labs with ID followup  Lab Results  Component Value Date   CHOL 163 10/22/2013   HDL 41 10/22/2013   LDLCALC 100* 10/22/2013   TRIG 111 10/22/2013   CHOLHDL 4.0 10/22/2013   Lab Results  Component Value Date   ALT 26 10/22/2013   AST 28 10/22/2013   ALKPHOS 60 10/22/2013   BILITOT 0.6 10/22/2013

## 2013-11-18 ENCOUNTER — Other Ambulatory Visit: Payer: Self-pay | Admitting: *Deleted

## 2013-11-18 DIAGNOSIS — B20 Human immunodeficiency virus [HIV] disease: Secondary | ICD-10-CM

## 2013-11-18 MED ORDER — DOLUTEGRAVIR SODIUM 50 MG PO TABS: ORAL_TABLET | ORAL | Status: DC

## 2013-11-18 MED ORDER — DARUNAVIR ETHANOLATE 800 MG PO TABS: ORAL_TABLET | ORAL | Status: DC

## 2013-11-18 MED ORDER — RITONAVIR 100 MG PO CAPS: ORAL_CAPSULE | ORAL | Status: DC

## 2013-11-18 MED ORDER — ETRAVIRINE 200 MG PO TABS: ORAL_TABLET | ORAL | Status: DC

## 2013-12-01 ENCOUNTER — Other Ambulatory Visit: Payer: Self-pay | Admitting: Licensed Clinical Social Worker

## 2013-12-01 DIAGNOSIS — B20 Human immunodeficiency virus [HIV] disease: Secondary | ICD-10-CM

## 2013-12-01 MED ORDER — RITONAVIR 100 MG PO CAPS: ORAL_CAPSULE | ORAL | Status: DC

## 2013-12-01 MED ORDER — ETRAVIRINE 200 MG PO TABS: ORAL_TABLET | ORAL | Status: DC

## 2013-12-04 ENCOUNTER — Other Ambulatory Visit: Payer: Self-pay | Admitting: Infectious Disease

## 2013-12-04 DIAGNOSIS — B20 Human immunodeficiency virus [HIV] disease: Secondary | ICD-10-CM

## 2013-12-05 ENCOUNTER — Other Ambulatory Visit: Payer: Self-pay | Admitting: *Deleted

## 2013-12-05 NOTE — Telephone Encounter (Signed)
Pharmacy mistake.  Already had refills for this medication.

## 2013-12-08 ENCOUNTER — Other Ambulatory Visit: Payer: Self-pay | Admitting: Internal Medicine

## 2013-12-08 ENCOUNTER — Ambulatory Visit: Payer: Medicare Other | Admitting: *Deleted

## 2013-12-08 VITALS — BP 126/76 | HR 72

## 2013-12-08 DIAGNOSIS — I10 Essential (primary) hypertension: Secondary | ICD-10-CM

## 2013-12-08 MED ORDER — LISINOPRIL 10 MG PO TABS: 10.0000 mg | ORAL_TABLET | Freq: Every day | ORAL | Status: DC

## 2013-12-08 NOTE — Progress Notes (Signed)
Pt presents for BP check. Doing well without complaints. Taking Lisinopril 10 mg daily. BP 126/76, HR 72. Advised pt to continue current regimen, that I would notify Dr. Derrel Nip with readings, and contact him if any changes needed,  verbalized understanding.

## 2013-12-09 ENCOUNTER — Telehealth: Payer: Self-pay | Admitting: Internal Medicine

## 2013-12-09 NOTE — Telephone Encounter (Signed)
emmi mailed  °

## 2013-12-10 ENCOUNTER — Other Ambulatory Visit: Payer: Self-pay | Admitting: Infectious Disease

## 2013-12-26 ENCOUNTER — Encounter: Payer: No Typology Code available for payment source | Admitting: Internal Medicine

## 2013-12-28 ENCOUNTER — Other Ambulatory Visit: Payer: Self-pay | Admitting: Infectious Disease

## 2013-12-28 DIAGNOSIS — E785 Hyperlipidemia, unspecified: Secondary | ICD-10-CM

## 2014-01-26 ENCOUNTER — Other Ambulatory Visit: Payer: Self-pay | Admitting: *Deleted

## 2014-01-26 MED ORDER — ALLOPURINOL 100 MG PO TABS: 200.0000 mg | ORAL_TABLET | Freq: Every day | ORAL | Status: DC

## 2014-04-01 ENCOUNTER — Other Ambulatory Visit: Payer: Self-pay | Admitting: *Deleted

## 2014-04-01 DIAGNOSIS — E785 Hyperlipidemia, unspecified: Secondary | ICD-10-CM

## 2014-04-01 MED ORDER — PRAVASTATIN SODIUM 40 MG PO TABS: 40.0000 mg | ORAL_TABLET | Freq: Every day | ORAL | Status: DC

## 2014-04-02 ENCOUNTER — Other Ambulatory Visit: Payer: Self-pay | Admitting: *Deleted

## 2014-04-02 MED ORDER — LISINOPRIL 10 MG PO TABS: 10.0000 mg | ORAL_TABLET | Freq: Every day | ORAL | Status: DC

## 2014-04-21 ENCOUNTER — Encounter: Payer: Self-pay | Admitting: Internal Medicine

## 2014-04-21 ENCOUNTER — Ambulatory Visit (INDEPENDENT_AMBULATORY_CARE_PROVIDER_SITE_OTHER): Payer: Medicare Other | Admitting: Internal Medicine

## 2014-04-21 VITALS — BP 124/82 | HR 74 | Temp 98.4°F | Resp 16 | Ht 71.0 in | Wt 225.0 lb

## 2014-04-21 DIAGNOSIS — N183 Chronic kidney disease, stage 3 (moderate): Secondary | ICD-10-CM

## 2014-04-21 DIAGNOSIS — M79671 Pain in right foot: Secondary | ICD-10-CM | POA: Diagnosis not present

## 2014-04-21 DIAGNOSIS — N189 Chronic kidney disease, unspecified: Secondary | ICD-10-CM

## 2014-04-21 DIAGNOSIS — N185 Chronic kidney disease, stage 5: Secondary | ICD-10-CM

## 2014-04-21 DIAGNOSIS — E669 Obesity, unspecified: Secondary | ICD-10-CM | POA: Diagnosis not present

## 2014-04-21 DIAGNOSIS — N181 Chronic kidney disease, stage 1: Secondary | ICD-10-CM

## 2014-04-21 DIAGNOSIS — I129 Hypertensive chronic kidney disease with stage 1 through stage 4 chronic kidney disease, or unspecified chronic kidney disease: Secondary | ICD-10-CM

## 2014-04-21 DIAGNOSIS — I10 Essential (primary) hypertension: Secondary | ICD-10-CM | POA: Diagnosis not present

## 2014-04-21 DIAGNOSIS — Z0000 Encounter for general adult medical examination without abnormal findings: Secondary | ICD-10-CM

## 2014-04-21 DIAGNOSIS — Z125 Encounter for screening for malignant neoplasm of prostate: Secondary | ICD-10-CM

## 2014-04-21 DIAGNOSIS — N182 Chronic kidney disease, stage 2 (mild): Secondary | ICD-10-CM

## 2014-04-21 DIAGNOSIS — J301 Allergic rhinitis due to pollen: Secondary | ICD-10-CM | POA: Diagnosis not present

## 2014-04-21 DIAGNOSIS — E785 Hyperlipidemia, unspecified: Secondary | ICD-10-CM

## 2014-04-21 DIAGNOSIS — N184 Chronic kidney disease, stage 4 (severe): Secondary | ICD-10-CM

## 2014-04-21 MED ORDER — MELOXICAM 15 MG PO TABS: 15.0000 mg | ORAL_TABLET | Freq: Every day | ORAL | Status: DC

## 2014-04-21 NOTE — Assessment & Plan Note (Addendum)
Improved control on current regimen.  Repeat renal function is due  Lab Results  Component Value Date   CREATININE 1.28 10/22/2013   Lab Results  Component Value Date   NA 140 10/22/2013   K 4.1 10/22/2013   CL 105 10/22/2013   CO2 25 10/22/2013

## 2014-04-21 NOTE — Progress Notes (Signed)
Patient ID: Adam Becker, male   DOB: 09/16/1955, 59 y.o.   MRN: [id number]  The patient is here for annual  wellness examination and management of other chronic and acute problems including 6 MONTH FOLLOW UP ON Hypertension hyperlipidemia and obesity. He is scheduled  for fasting labs next week , prior to his next ID appt.  He feels generally well except for foot pain.  He is taking his medications as prescribed and denies LE edema.  orthostasis and muscle pain.  Has lost weight intentionally through reduction in portion size and increased vegetable intake .  He has not been exercising regularly but has a physically active job and has been having some foot pain. Having some plantar foot pain at the anterior edge of heel . Most tender when he first gets up and puts weight on it.  , better once he is up and walking around.  There is no history of fall or unusual activity.      The risk factors are reflected in the social history.  The roster of all physicians providing medical care to patient - is listed in the Snapshot section of the chart.  Activities of daily living:  The patient is 100% independent in all ADLs: dressing, toileting, feeding as well as independent mobility  Home safety : The patient has smoke detectors in the home. They wear seatbelts.  There are no firearms at home. There is no violence in the home.   There is no risks for hepatitis, STDs or HIV. There is no   history of blood transfusion. They have no travel history to infectious disease endemic areas of the world.  The patient has seen their dentist in the last six month. They have seen their eye doctor in the last year. They admit to slight hearing difficulty with regard to whispered voices and some television programs.  They have deferred audiologic testing in the last year.  They do not  have excessive sun exposure. Discussed the need for sun protection: hats, long sleeves and use of sunscreen if there is significant sun  exposure.   Diet: the importance of a healthy diet is discussed. They do have a healthy diet.  The benefits of regular aerobic exercise were discussed. She walks 4 times per week ,  20 minutes.   Depression screen: there are no signs or vegative symptoms of depression- irritability, change in appetite, anhedonia, sadness/tearfullness.  Cognitive assessment: the patient manages all their financial and personal affairs and is actively engaged. They could relate day,date,year and events; recalled 2/3 objects at 3 minutes; performed clock-face test normally.  The following portions of the patient's history were reviewed and updated as appropriate: allergies, current medications, past family history, past medical history,  past surgical history, past social history  and problem list.  Visual acuity was not assessed per patient preference since she has regular follow up with her ophthalmologist. Hearing and body mass index were assessed and reviewed.   During the course of the visit the patient was educated and counseled about appropriate screening and preventive services including : fall prevention , diabetes screening, nutrition counseling, colorectal cancer screening, and recommended immunizations.   Review of Systems:  Patient denies headache, fevers, malaise, unintentional weight loss, skin rash, eye pain, sinus congestion and sinus pain, sore throat, dysphagia,  hemoptysis , cough, dyspnea, wheezing, chest pain, palpitations, orthopnea, edema, abdominal pain, nausea, melena, diarrhea, constipation, flank pain, dysuria, hematuria, urinary  Frequency, nocturia, numbness, tingling, seizures,  Focal weakness,  Loss of consciousness,  Tremor, insomnia, depression, anxiety, and suicidal ideation.      Objective:  BP 124/82 mmHg  Pulse 74  Temp(Src) 98.4 F (36.9 C) (Oral)  Resp 16  Ht 5\' 11"  (1.803 m)  Wt 225 lb (102.059 kg)  BMI 31.39 kg/m2  SpO2 96%  General appearance: alert,  cooperative and appears stated age Ears: normal TM's and external ear canals both ears Throat: lips, mucosa, and tongue normal; teeth and gums normal Neck: no adenopathy, no carotid bruit, supple, symmetrical, trachea midline and thyroid not enlarged, symmetric, no tenderness/mass/nodules Back: symmetric, no curvature. ROM normal. No CVA tenderness. Lungs: clear to auscultation bilaterally Heart: regular rate and rhythm, S1, S2 normal, no murmur, click, rub or gallop Abdomen: soft, non-tender; bowel sounds normal; no masses,  no organomegaly Pulses: 2+ and symmetric Skin: Skin color, texture, turgor normal. No rashes or lesions Lymph nodes: Cervical, supraclavicular, and axillary nodes normal.   Assessment and Plan:    Problem List Items Addressed This Visit      Unprioritized   Visit for preventive health examination    Annual wellness  exam was done as well as a comprehensive physical exam and management of acute and chronic conditions .  During the course of the visit the patient was educated and counseled about appropriate screening and preventive services including :  diabetes screening, lipid analysis with projected  10 year  risk for CAD , nutrition counseling, colorectal cancer screening, and recommended immunizations.  Printed recommendations for health maintenance screenings was given.       Pain, heel    Suspect bone spur vs early plantar fasciitis.  Trial of meloxicam.  If not tolerated or if cr changes with next weeks labs..  Will dc NSAID, order plain films vs  podiatry evaluation offered.   Lab Results  Component Value Date   CREATININE 1.28 10/22/2013         Obesity (BMI 30.0-34.9)    I have congratulated him in reduction of   BMI and encouraged  Continued weight loss with goal of 10% of body weight over the next 6 months using a low glycemic index diet and regular exercise a minimum of 5 days per week.  Body mass index is 31.39 kg/(m^2).  Wt Readings from Last 3  Encounters:  04/21/14 225 lb (102.059 kg)  11/11/13 231 lb 8 oz (105.008 kg)  11/07/13 230 lb (104.327 kg)        Nephropathy hypertensive    Vs other secondary causes including HIV>  Continue  Lisinopril and tight BP control      HYPERTENSION, BENIGN ESSENTIAL - Primary    Improved control on current regimen.  Repeat renal function is due  Lab Results  Component Value Date   CREATININE 1.28 10/22/2013   Lab Results  Component Value Date   NA 140 10/22/2013   K 4.1 10/22/2013   CL 105 10/22/2013   CO2 25 10/22/2013         Hyperlipidemia    Now managed with pravastatin for low HDL mildly elevated LDL. Last LDL was 128 in January. He was not fasting today,  And will have a repeat fasting panel with next set of labs with ID followup  Lab Results  Component Value Date   CHOL 163 10/22/2013   HDL 41 10/22/2013   LDLCALC 100* 10/22/2013   TRIG 111 10/22/2013   CHOLHDL 4.0 10/22/2013   Lab Results  Component Value Date   ALT 26 10/22/2013  AST 28 10/22/2013   ALKPHOS 60 10/22/2013   BILITOT 0.6 10/22/2013           Allergic rhinitis due to allergen    Trial of zyrtec        Other Visit Diagnoses    Screening for prostate cancer        Relevant Orders    PSA, Medicare

## 2014-04-21 NOTE — Patient Instructions (Addendum)
Your heel pain  May be due to a bone spur or due to plantar fasciitis   Let's try meloxicam once  Daily  IN THE EVENING, (ANTI INFLAMMATORY)   OK TO ADD UP TO 2000 MG TYLENOL DAILY BUT O MOTRIN OR ALEVE   OK TO USE ZYRTEC DAILY FOR ALLERGIC RHINITIS (THE GENERIC IS CETIRIZINE) AND SAVE THE TYLENOL SINUS FOR WHEN YOU ARE CONGESTED   Health Maintenance A healthy lifestyle and preventative care can promote health and wellness.  Maintain regular health, dental, and eye exams.  Eat a healthy diet. Foods like vegetables, fruits, whole grains, low-fat dairy products, and lean protein foods contain the nutrients you need and are low in calories. Decrease your intake of foods high in solid fats, added sugars, and salt. Get information about a proper diet from your health care provider, if necessary.  Regular physical exercise is one of the most important things you can do for your health. Most adults should get at least 150 minutes of moderate-intensity exercise (any activity that increases your heart rate and causes you to sweat) each week. In addition, most adults need muscle-strengthening exercises on 2 or more days a week.   Maintain a healthy weight. The body mass index (BMI) is a screening tool to identify possible weight problems. It provides an estimate of body fat based on height and weight. Your health care provider can find your BMI and can help you achieve or maintain a healthy weight. For males 20 years and older:  A BMI below 18.5 is considered underweight.  A BMI of 18.5 to 24.9 is normal.  A BMI of 25 to 29.9 is considered overweight.  A BMI of 30 and above is considered obese.  Maintain normal blood lipids and cholesterol by exercising and minimizing your intake of saturated fat. Eat a balanced diet with plenty of fruits and vegetables. Blood tests for lipids and cholesterol should begin at age 46 and be repeated every 5 years. If your lipid or cholesterol levels are high, you are  over age 3, or you are at high risk for heart disease, you may need your cholesterol levels checked more frequently.Ongoing high lipid and cholesterol levels should be treated with medicines if diet and exercise are not working.  If you smoke, find out from your health care provider how to quit. If you do not use tobacco, do not start.  Lung cancer screening is recommended for adults aged 66-80 years who are at high risk for developing lung cancer because of a history of smoking. A yearly low-dose CT scan of the lungs is recommended for people who have at least a 30-pack-year history of smoking and are current smokers or have quit within the past 15 years. A pack year of smoking is smoking an average of 1 pack of cigarettes a day for 1 year (for example, a 30-pack-year history of smoking could mean smoking 1 pack a day for 30 years or 2 packs a day for 15 years). Yearly screening should continue until the smoker has stopped smoking for at least 15 years. Yearly screening should be stopped for people who develop a health problem that would prevent them from having lung cancer treatment.  If you choose to drink alcohol, do not have more than 2 drinks per day. One drink is considered to be 12 oz (360 mL) of beer, 5 oz (150 mL) of wine, or 1.5 oz (45 mL) of liquor.  Avoid the use of street drugs. Do not  share needles with anyone. Ask for help if you need support or instructions about stopping the use of drugs.  High blood pressure causes heart disease and increases the risk of stroke. Blood pressure should be checked at least every 1-2 years. Ongoing high blood pressure should be treated with medicines if weight loss and exercise are not effective.  If you are 49-67 years old, ask your health care provider if you should take aspirin to prevent heart disease.  Diabetes screening involves taking a blood sample to check your fasting blood sugar level. This should be done once every 3 years after age 83 if  you are at a normal weight and without risk factors for diabetes. Testing should be considered at a younger age or be carried out more frequently if you are overweight and have at least 1 risk factor for diabetes.  Colorectal cancer can be detected and often prevented. Most routine colorectal cancer screening begins at the age of 58 and continues through age 66. However, your health care provider may recommend screening at an earlier age if you have risk factors for colon cancer. On a yearly basis, your health care provider may provide home test kits to check for hidden blood in the stool. A small camera at the end of a tube may be used to directly examine the colon (sigmoidoscopy or colonoscopy) to detect the earliest forms of colorectal cancer. Talk to your health care provider about this at age 66 when routine screening begins. A direct exam of the colon should be repeated every 5-10 years through age 86, unless early forms of precancerous polyps or small growths are found.  People who are at an increased risk for hepatitis B should be screened for this virus. You are considered at high risk for hepatitis B if:  You were born in a country where hepatitis B occurs often. Talk with your health care provider about which countries are considered high risk.  Your parents were born in a high-risk country and you have not received a shot to protect against hepatitis B (hepatitis B vaccine).  You have HIV or AIDS.  You use needles to inject street drugs.  You live with, or have sex with, someone who has hepatitis B.  You are a man who has sex with other men (MSM).  You get hemodialysis treatment.  You take certain medicines for conditions like cancer, organ transplantation, and autoimmune conditions.  Hepatitis C blood testing is recommended for all people born from [birth year] through 1964 and any individual with known risk factors for hepatitis C.  Healthy men should no longer receive prostate-specific  antigen (PSA) blood tests as part of routine cancer screening. Talk to your health care provider about prostate cancer screening.  Testicular cancer screening is not recommended for adolescents or adult males who have no symptoms. Screening includes self-exam, a health care provider exam, and other screening tests. Consult with your health care provider about any symptoms you have or any concerns you have about testicular cancer.  Practice safe sex. Use condoms and avoid high-risk sexual practices to reduce the spread of sexually transmitted infections (STIs).  You should be screened for STIs, including gonorrhea and chlamydia if:  You are sexually active and are younger than 24 years.  You are older than 24 years, and your health care provider tells you that you are at risk for this type of infection.  Your sexual activity has changed since you were last screened, and you are at an  increased risk for chlamydia or gonorrhea. Ask your health care provider if you are at risk.  If you are at risk of being infected with HIV, it is recommended that you take a prescription medicine daily to prevent HIV infection. This is called pre-exposure prophylaxis (PrEP). You are considered at risk if:  You are a man who has sex with other men (MSM).  You are a heterosexual man who is sexually active with multiple partners.  You take drugs by injection.  You are sexually active with a partner who has HIV.  Talk with your health care provider about whether you are at high risk of being infected with HIV. If you choose to begin PrEP, you should first be tested for HIV. You should then be tested every 3 months for as long as you are taking PrEP.  Use sunscreen. Apply sunscreen liberally and repeatedly throughout the day. You should seek shade when your shadow is shorter than you. Protect yourself by wearing long sleeves, pants, a wide-brimmed hat, and sunglasses year round whenever you are outdoors.  Tell  your health care provider of new moles or changes in moles, especially if there is a change in shape or color. Also, tell your health care provider if a mole is larger than the size of a pencil eraser.  A one-time screening for abdominal aortic aneurysm (AAA) and surgical repair of large AAAs by ultrasound is recommended for men aged 31-75 years who are current or former smokers.  Stay current with your vaccines (immunizations). Document Released: 07/08/2007 Document Revised: 01/14/2013 Document Reviewed: 06/06/2010 [facility] Patient Information 2015 [place], [place]. This information is not intended to replace advice given to you by your health care provider. Make sure you discuss any questions you have with your health care provider.

## 2014-04-23 ENCOUNTER — Encounter: Payer: Self-pay | Admitting: Internal Medicine

## 2014-04-23 DIAGNOSIS — I129 Hypertensive chronic kidney disease with stage 1 through stage 4 chronic kidney disease, or unspecified chronic kidney disease: Secondary | ICD-10-CM | POA: Insufficient documentation

## 2014-04-23 DIAGNOSIS — Z0000 Encounter for general adult medical examination without abnormal findings: Secondary | ICD-10-CM | POA: Insufficient documentation

## 2014-04-23 DIAGNOSIS — M79673 Pain in unspecified foot: Secondary | ICD-10-CM | POA: Insufficient documentation

## 2014-04-23 DIAGNOSIS — J309 Allergic rhinitis, unspecified: Secondary | ICD-10-CM | POA: Insufficient documentation

## 2014-04-23 NOTE — Assessment & Plan Note (Signed)

## 2014-04-23 NOTE — Assessment & Plan Note (Signed)
Suspect bone spur vs early plantar fasciitis.  Trial of meloxicam.  If not tolerated or if cr changes with next weeks labs..  Will dc NSAID, order plain films vs  podiatry evaluation offered.   Lab Results  Component Value Date   CREATININE 1.28 10/22/2013

## 2014-04-23 NOTE — Assessment & Plan Note (Signed)
I have congratulated him in reduction of   BMI and encouraged  Continued weight loss with goal of 10% of body weight over the next 6 months using a low glycemic index diet and regular exercise a minimum of 5 days per week.  Body mass index is 31.39 kg/(m^2).  Wt Readings from Last 3 Encounters:  04/21/14 225 lb (102.059 kg)  11/11/13 231 lb 8 oz (105.008 kg)  11/07/13 230 lb (104.327 kg)

## 2014-04-23 NOTE — Assessment & Plan Note (Signed)
Trial of zyrtec.  

## 2014-04-23 NOTE — Assessment & Plan Note (Signed)
Vs other secondary causes including HIV>  Continue  Lisinopril and tight BP control

## 2014-04-23 NOTE — Assessment & Plan Note (Signed)
Now managed with pravastatin for low HDL mildly elevated LDL. Last LDL was 128 in January. He was not fasting today,  And will have a repeat fasting panel with next set of labs with ID followup  Lab Results  Component Value Date   CHOL 163 10/22/2013   HDL 41 10/22/2013   LDLCALC 100* 10/22/2013   TRIG 111 10/22/2013   CHOLHDL 4.0 10/22/2013   Lab Results  Component Value Date   ALT 26 10/22/2013   AST 28 10/22/2013   ALKPHOS 60 10/22/2013   BILITOT 0.6 10/22/2013

## 2014-04-28 ENCOUNTER — Other Ambulatory Visit: Payer: Medicare Other

## 2014-04-28 ENCOUNTER — Other Ambulatory Visit (HOSPITAL_COMMUNITY)
Admission: RE | Admit: 2014-04-28 | Discharge: 2014-04-28 | Disposition: A | Discharge status: Home / Self Care | Payer: Medicare Other | Source: Ambulatory Visit | Attending: Infectious Disease | Admitting: Infectious Disease

## 2014-04-28 DIAGNOSIS — Z113 Encounter for screening for infections with a predominantly sexual mode of transmission: Secondary | ICD-10-CM | POA: Diagnosis not present

## 2014-04-28 DIAGNOSIS — B20 Human immunodeficiency virus [HIV] disease: Secondary | ICD-10-CM

## 2014-04-28 DIAGNOSIS — Z79899 Other long term (current) drug therapy: Secondary | ICD-10-CM

## 2014-04-28 LAB — CBC WITH DIFFERENTIAL/PLATELET
Basophils Absolute: 0.1 10*3/uL (ref 0.0–0.1)
Basophils Relative: 1 % (ref 0–1)
Eosinophils Absolute: 0.5 10*3/uL (ref 0.0–0.7)
Eosinophils Relative: 7 % — ABNORMAL HIGH (ref 0–5)
HCT: 40.4 % (ref 39.0–52.0)
Hemoglobin: 13.9 g/dL (ref 13.0–17.0)
Lymphocytes Relative: 60 % — ABNORMAL HIGH (ref 12–46)
Lymphs Abs: 4.4 10*3/uL — ABNORMAL HIGH (ref 0.7–4.0)
MCH: 32.3 pg (ref 26.0–34.0)
MCHC: 34.4 g/dL (ref 30.0–36.0)
MCV: 93.7 fL (ref 78.0–100.0)
MPV: 8.8 fL (ref 8.6–12.4)
Monocytes Absolute: 0.7 10*3/uL (ref 0.1–1.0)
Monocytes Relative: 10 % (ref 3–12)
Neutro Abs: 1.6 10*3/uL — ABNORMAL LOW (ref 1.7–7.7)
Neutrophils Relative %: 22 % — ABNORMAL LOW (ref 43–77)
Platelets: 380 10*3/uL (ref 150–400)
RBC: 4.31 MIL/uL (ref 4.22–5.81)
RDW: 14.5 % (ref 11.5–15.5)
WBC: 7.4 10*3/uL (ref 4.0–10.5)

## 2014-04-28 LAB — RPR: RPR Ser Ql: REACTIVE — AB

## 2014-04-28 LAB — COMPLETE METABOLIC PANEL WITH GFR
ALT: 31 U/L (ref 0–53)
AST: 29 U/L (ref 0–37)
Albumin: 4.3 g/dL (ref 3.5–5.2)
Alkaline Phosphatase: 58 U/L (ref 39–117)
BUN: 13 mg/dL (ref 6–23)
CO2: 24 mEq/L (ref 19–32)
Calcium: 9.2 mg/dL (ref 8.4–10.5)
Chloride: 107 mEq/L (ref 96–112)
Creat: 1.26 mg/dL (ref 0.50–1.35)
GFR, Est African American: 73 mL/min
GFR, Est Non African American: 63 mL/min
Glucose, Bld: 90 mg/dL (ref 70–99)
Potassium: 4.5 mEq/L (ref 3.5–5.3)
Sodium: 139 mEq/L (ref 135–145)
Total Bilirubin: 0.4 mg/dL (ref 0.2–1.2)
Total Protein: 7.5 g/dL (ref 6.0–8.3)

## 2014-04-28 LAB — LIPID PANEL
Cholesterol: 150 mg/dL (ref 0–200)
HDL: 41 mg/dL (ref >=40)
LDL Cholesterol: 92 mg/dL (ref 0–99)
Total CHOL/HDL Ratio: 3.7 Ratio
Triglycerides: 86 mg/dL (ref <150)
VLDL: 17 mg/dL (ref 0–40)

## 2014-04-28 LAB — RPR TITER: RPR Titer: 1:2 {titer}

## 2014-04-28 NOTE — Addendum Note (Signed)
Addended by: Janyce Llanos F on: 04/28/2014 01:41 PM   Modules accepted: Orders

## 2014-04-29 LAB — URINE CYTOLOGY ANCILLARY ONLY
Chlamydia: NEGATIVE
Neisseria Gonorrhea: NEGATIVE

## 2014-04-29 LAB — T-HELPER CELL (CD4) - (RCID CLINIC ONLY)
CD4 % Helper T Cell: 29 % — ABNORMAL LOW (ref 33–55)
CD4 T Cell Abs: 1310 /uL (ref 400–2700)

## 2014-04-29 LAB — HIV-1 RNA QUANT-NO REFLEX-BLD
HIV 1 RNA Quant: <20 copies/mL (ref <20)
HIV-1 RNA Quant, Log: <1.3 {Log} (ref <1.30)

## 2014-04-29 LAB — MICROALBUMIN / CREATININE URINE RATIO
Creatinine, Urine: 155.6 mg/dL
Microalb Creat Ratio: 37.9 mg/g — ABNORMAL HIGH (ref 0.0–30.0)
Microalb, Ur: 5.9 mg/dL — ABNORMAL HIGH (ref <2.0)

## 2014-04-29 LAB — FLUORESCENT TREPONEMAL AB(FTA)-IGG-BLD: Fluorescent Treponemal ABS: REACTIVE — AB

## 2014-05-12 ENCOUNTER — Ambulatory Visit (INDEPENDENT_AMBULATORY_CARE_PROVIDER_SITE_OTHER): Payer: Medicare Other | Admitting: Infectious Disease

## 2014-05-12 ENCOUNTER — Encounter: Payer: Self-pay | Admitting: Infectious Disease

## 2014-05-12 VITALS — BP 135/93 | HR 78 | Temp 98.0°F | Wt 232.0 lb

## 2014-05-12 DIAGNOSIS — E785 Hyperlipidemia, unspecified: Secondary | ICD-10-CM | POA: Diagnosis not present

## 2014-05-12 DIAGNOSIS — I10 Essential (primary) hypertension: Secondary | ICD-10-CM

## 2014-05-12 DIAGNOSIS — B20 Human immunodeficiency virus [HIV] disease: Secondary | ICD-10-CM

## 2014-05-12 NOTE — Consult Note (Signed)
Impression: 59yo BM w/ h/o HIV infection with CD4 count >900 admitted with severe diarrhea and acute renal function.  His CD4 count is very high.  There is no particular risk for opportunistic infections.  C. diff PCR was negative.  Stool cultures are negative so far.  Giardia and O&P are pending.  Viral etiologies are also possible.  Given that he has been on the same HAART for 4 years, it is highly unlikely that his antiretrovirals are causing his diarrhea.  He has no fever and his WBC is not particularly elevated.  He is unable to get a contrasted CT due to his renal function. Will continue the cipro and metronidazole for now.  If his stool cultures remain negative, will stop the cipro.  If the Giardia EIA is negative, will stop the metronidazole. Continue hydration and following renal function. Severe lactic acidosis has been associated with HAART, but mainly in the setting of nucleoside reverse transcripase inhibitors.  It has occasionally be associated with protease inhibitors.  Typically this would cause an anion gap metabolic acidosis and his anion gap is normal.  Will check his lactic acid level. Will restart his HAART if his lactic acid level is ok.  Electronic Signatures: Asif Muchow, Heinz Knuckles (MD)  (Signed on 04-Nov-13 18:24)  Authored  Last Updated: 04-Nov-13 18:24 by Ambermarie Honeyman, Heinz Knuckles (MD)

## 2014-05-12 NOTE — Consult Note (Signed)
PATIENT NAME:  Adam Becker, Adam Becker MR#:  [id number] DATE OF BIRTH:  08-Jul-1955  DATE OF CONSULTATION:  11/26/2011  REFERRING PHYSICIAN:  Dr. Vianne Bulls CONSULTING PHYSICIAN:  Jheremy Boger Lilian Kapur, MD  REASON FOR CONSULTATION: Severe acute renal failure.   HISTORY OF PRESENT ILLNESS: The patient is a pleasant 59 year old African American male with past medical history of hypertension, HIV, splenectomy, hyperlipidemia who presented to [facility] today with a nine-day history of persistent diarrhea. The patient is followed by Dr. [name] in [place] for underlying HIV. He reports that approximately nine days ago he developed diarrhea. It was quite heavy early on, approximately 10 episodes of diarrhea per day. He denied bloody stools or melena. Denies any known sick contacts. He also had some nausea and vomiting earlier. However, this has abated over the past several days. He saw Dr. [name] for these symptoms and was prescribed Phenergan. The patient is also noted to be on hydrochlorothiazide which he has taken periodically over this illness. He has also taken some intermittent Advil. He now presents with severe acute renal failure. His BUN is 159 and his creatinine is quite high at 11.2. He also has quite severe metabolic acidosis with a serum bicarbonate of 9. Anion gap was noted as being 13. Phosphorus is also quite high at 12. He denies ingestion of any other nephrotoxins.   PAST MEDICAL HISTORY:  1. Hypertension.  2. HIV followed by Dr. [name] in [place].  3. Hyperlipidemia.   PAST SURGICAL HISTORY: Splenectomy.   ALLERGIES: No known drug allergies.   HOME MEDICATIONS:  1. Phenergan 12.5 mg p.o. every six hours.  2. Prezista 800 mg p.o. daily. 3. Pravastatin 40 mg daily. 4. Norvir 100 mg daily. 5. Isentress 400 mg p.o. b.i.d.  6. Intelence 200 mg p.o. daily.  7. Hydrochlorothiazide/lisinopril 12.5/20 mg p.o. daily.   SOCIAL HISTORY: The patient lives in [place].  He is single. He has no children. He reports he is on disability. He denies tobacco, alcohol, or illicit drug use.   FAMILY HISTORY: Mother deceased secondary to breast cancer. Father deceased secondary to colon cancer. No known family history of end-stage renal disease.   REVIEW OF SYSTEMS: CONSTITUTIONAL: Currently denies fevers or chills. EYES: Denies diplopia or blurry vision. HEENT: Denies headaches, hearing loss, tinnitus, epistaxis, or sore throat. CARDIOVASCULAR: Denies chest pain, palpitations, PND. RESPIRATORY: Denies cough, shortness of breath, or hemoptysis. GASTROINTESTINAL: Has had some nausea and vomiting, also reports significant diarrhea, approximately 10 episodes some days. GU: Denies frequency or urgency. Reports diminished urine output. MUSCULOSKELETAL: Denies joint pain, swelling or redness. INTEGUMENTARY: Denies skin rashes or lesions. NEUROLOGIC: Denies focal extremity numbness or weakness, but does have generalized weakness. PSYCHIATRIC: Denies depression or bipolar disorder. ENDOCRINE: Denies polyuria, polydipsia or polyphagia. HEMATOLOGIC/LYMPHATIC: Denies easy bruisability, bleeding or swollen lymph nodes. ALLERGY/IMMUNOLOGIC: Denies seasonal allergies or history of immunodeficiency.   PHYSICAL EXAMINATION:  VITAL SIGNS: Temperature 97.5, pulse 86, respirations 18, blood pressure 95/60.   GENERAL: Well-developed, well-nourished African American male who appears his stated age, currently in no acute distress.   HEENT: Normocephalic, atraumatic. Extraocular movements are intact. Pupils equal, round, and reactive to light. No scleral icterus. Conjunctivae are pink. No epistaxis noted. Gross hearing intact. Oral mucosa are dry.   NECK: Supple without JVD or lymphadenopathy.   LUNGS: Clear to auscultation bilaterally with normal respiratory effort.   HEART: S1, S2, regular rate and rhythm. No obvious pericardial friction rub heard at this time.   ABDOMEN: Soft,  nontender,  nondistended. Bowel sounds positive. No rebound or guarding. No gross organomegaly appreciated.   EXTREMITIES: No clubbing, cyanosis, or edema.   NEUROLOGIC: The patient is alert and oriented to time, person, and place. Strength is five out of five in both upper and lower extremities.   SKIN: Warm and dry. No rashes noted.   MUSCULOSKELETAL: No joint redness, swelling or tenderness appreciated.   GU: No suprapubic tenderness is noted at this time.   PSYCHIATRIC: The patient is with appropriate affect and appears to have good insight into his current illness.   LABORATORY DATA: CBC shows WBC 11.2, hemoglobin 14, hematocrit 41, platelets 411. Troponin less than 0.02. CK-MB 2.7. Complete metabolic panel shows sodium 131, potassium 4.3, chloride 109, CO2 9, BUN 159, creatinine 11, glucose 104, total protein 9.6. Anion gap 13, magnesium 2.3, phosphorus 12.   IMPRESSION: This is a 59 year old African American male with past medical history of hypertension, HIV, splenectomy, and hyperlipidemia who presented to [facility] today with ongoing diarrhea for the past 10 days. The patient was also intermittently taking Advil as well as lisinopril/hydrochlorothiazide. The patient now found to have quite severe acute renal failure.  1. Acute renal failure. Multifactorial with contributions from severe volume depletion from persistent diarrhea, use of NSAID in the form of Advil, and continued use of hydrochlorothiazide/lisinopril. Most likely, the patient has acute tubular necrosis at this time. We are currently awaiting a renal ultrasound to exclude obstruction though the patient states he is making some urine. There is some possibility that the patient may end up requiring hemodialysis. However, at this point in time since potassium is acceptable, we prefer to start with gentle hydration. I have discussed the case with Dr. Vianne Bulls and we agreed to start the patient on sodium bicarbonate drip  150 mEq at 75 mL/h. We will currently avoid any further nephrotoxins including NSAIDs or oral contrast dye. As above, however, I did discuss with the patient that there is some potential he may need some short term hemodialysis, however, we will follow labs closely for now.  2. Metabolic acidosis. Anion gap was actually found to be normal. The patient is being started on sodium bicarbonate drip as above.  3. Hypotension. Blood pressure was noted to be quite low initially in the Emergency Department. He was initially administered normal saline. Sodium bicarbonate solution that we are giving has the same tenacity as normal saline so it should provide some volume support as well.  4. HIV. Many of the medicines that the patient is on are known to cause metabolic acidosis. However, the patient reports that he has been on these medicines for quite some time and has not had issue in the past. We would recommend infectious disease consultation; however, I defer this to the hospitalist.   I would like to thank Dr. Vianne Bulls for this kind referral. Further plan as the patient progresses.  ____________________________ Tama High, MD mnl:ap D: 11/26/2011 12:01:39 ET T: 11/26/2011 12:32:41 ET JOB#: [id number]  cc: Tama High, MD, <Dictator> Tama High MD ELECTRONICALLY SIGNED 12/05/2011 10:47

## 2014-05-12 NOTE — H&P (Signed)
PATIENT NAME:  Adam Becker, Adam Becker MR#:  [id number] DATE OF BIRTH:  October 26, 1955  DATE OF ADMISSION:  11/26/2011  ADDENDUM   MEDICATION: We got the patient's medicines from pharmacy.  1. HCTZ/lisinopril 12.5/20 mg p.o. daily.  2. Intelence 200 mg p.o. 2 tablets daily.  3. Isentress 400 mg p.o. b.i.d.  4. Norvir 100 mg p.o. daily.  5. Pravastatin 40 milligrams p.o. daily.  6. Prezista 800 mg p.o. daily. 7. Promethazine 12.5 mg p.o. q.6 hours p.r.n.   Hold HCTZ/lisinopril due to renal failure. The patient is on Norvir and also Prezista. Both are protease inhibitors. We will hold them at this time because of acute diarrhea but he can be on Intelence and Isentress. We will get the list of his recent viral load and all that from the patient's family doctor.   ____________________________ Epifanio Lesches, MD sk:ap D: 11/26/2011 10:54:41 ET T: 11/26/2011 12:16:09 ET JOB#: [id number]  cc: Epifanio Lesches, MD, <Dictator>  Epifanio Lesches MD ELECTRONICALLY SIGNED 01/09/2012 14:52

## 2014-05-12 NOTE — Discharge Summary (Signed)
PATIENT NAME:  Adam Becker, SMESTAD MR#:  [id number] DATE OF BIRTH:  1956/01/10  DATE OF ADMISSION:  11/26/2011 DATE OF DISCHARGE:  11/30/2011  DISPOSITION: Home.  PRIMARY CARE PHYSICIAN: Deborra Medina, MD  NEPHROLOGIST: Murlean Iba, MD    CONSULTANTS: 1. Lanae Boast, MD - Infectious Disease.  2. Murlean Iba, MD - Nephrology.  DISCHARGE DIAGNOSES:  1. Viral gastroenteritis. 2. HIV infection. 3. Severe metabolic acidosis. 4. Acute renal failure.  5. Hypertension.   DISCHARGE MEDICATIONS:  1. Intelence 10 mg two tablets daily. 2. Prezista 800 mg p.o. daily.  3. Isentress 400 mg p.o. twice a day. 4. Norvir 100 mg p.o. daily. 5. Pravastatin 40 mg daily. 6. Imodium 2 mg tablets four times daily.  NOTE: The patient is advised to stop HCTZ with lisinopril because of recovery from acute renal failure.  HOSPITAL COURSE: The patient is a 59 year old male with history of hypertension on HAART medications for HIV who came in because of severe diarrhea. The patient has been having diarrhea like about 7 to 9 days before the admission. The patient had been to primary doctor where he had cultures done, but because of persistent diarrhea with weakness he came. The patient's blood pressure was 71/53 on admission, heart rate 103, and the patient's BUN was 159 and creatinine 11. The patient was admitted for gastroenteritis. The patient did have stool cultures for ova and parasites and Giardia. Initially started on Cipro and Flagyl and stool for Clostridium difficile also was sent. Human immunodeficiency virus medications were held because of diarrhea. The patient was seen by Dr. Clayborn Bigness. The patient has been on HIV medications for a long time so Dr. Clayborn Bigness said it is unlikely the cause of her diarrhea, so HIV medications were restarted. The patient was started on IV fluids. The patient's initial BUN and creatinine showed BUN 59 and creatinine 11 and bicarbonate was 9. He was started on IV fluids  with bicarbonate drip. The patient received D5 with 3 amps of bicarbonate, 75 mL/h from 11/03 to 11/29/2011 then changed to normal saline. The patient's stool cultures have been followed. Stool for C. difficile is negative. Giardia and ova and parasite negative. Stool cultures did not show any salmonella or trigonella or Campylobacter. The patient started to have viral gastroenteritis because his Helper cell count is 963 and opportunistic infections are less likely the cause of diarrhea. Even his diarrhea improved much and the patient improved. Hemodynamically his kidney function improved and most recent BUN and creatinine yesterday; BUN was 94 and creatinine 3.16. Bicarbonate also improved to 26. I spoke with Dr. Candiss Norse and he suggested the patient can be discharged home and he will follow up with him in a week or so regarding repeat kidney function. The patient does have a renal ultrasound which showed no acute kidney obstruction. The patient has no diarrhea and is able to tolerate a regular diet. His blood pressure medications have been stopped because of renal failure. He was on HCTZ with lisinopril, but they are stopped, and blood pressure has been around 129/80 so I told him that these medications can be held for some time and he needs to see Dr. Candiss Norse for follow-up BNP. Dr. Derrel Nip is his primary doctor. He already has a scheduled appointment to see her in like two weeks. His hypotension, due to diarrhea and dehydration, resolved.  The patient is stable for discharge. He can resume his HAART medications as he is doing now.  TIME SPENT ON DISCHARGE PREPARATION: More than 30  minutes. ____________________________ Epifanio Lesches, MD sk:slb D: 11/30/2011 11:13:25 ET     T: 12/01/2011 10:36:09 ET       JOB#: [id number] cc: Epifanio Lesches, MD, <Dictator> Deborra Medina, MD Epifanio Lesches MD ELECTRONICALLY SIGNED 12/16/2011 15:09

## 2014-05-12 NOTE — Consult Note (Signed)
PATIENT NAME:  Adam Becker, Adam Becker MR#:  [id number] DATE OF BIRTH:  [birth year]  DATE OF CONSULTATION:  11/27/2011  REFERRING PHYSICIAN:  Dr. Max Sane  CONSULTING PHYSICIAN:  Heinz Knuckles. Hagar Sadiq, MD  REASON FOR CONSULTATION: Diarrhea and renal failure in an HIV positive patient.   HISTORY OF PRESENT ILLNESS: Patient is a 59 year old black man with a past history significant for HIV infection who was admitted on 11/03 with diarrhea for approximately nine days. Patient states that he had had fish one night prior to admission and that evening began having diarrhea. He felt initially that it was related to the fish that he had eaten although he does not know of anyone else who had eaten the fish who had become sick. He did not have fevers, chills, or sweats but he continued to have diarrhea multiple times overnight. He felt somewhat better the next day although he did have some nausea. He was seen by Dr. [name] who is his HIV provider in [place] and was given Phenergan. He continued to have loose stools over the next several days mainly in the morning and evening than during the day but oftentimes keeping him up much of the night. He had no blood, no purulence in the stool. He did not have significant abdominal pain. He has not had any fevers, chills, or sweats. He has had decreased p.o. intake and has lost about 15 pounds in the last few weeks. He continued to take his blood pressure medicines, however. He came to the Emergency Room, was found to be hypotensive and tachycardic. He also was found to have elevated creatinine. He has been given IV fluids and his HIV medicines have been held. The patient states that he has been on his same HIV medicines for approximately four years. He was diagnosed approximately 20 years ago and has had several regimens. He does not recall if any of his regimens were changed due to resistance. He has not had any diarrhea that he relates to his medications over the last four  years. He was started on metronidazole and Cipro. His white count on admission was 11.2 and has come down today to 8.1. His CD4 count was 963.   ALLERGIES: None.   PAST MEDICAL HISTORY:  1. HIV infection. Patient is on chronic stable HAART for the past four years. He denies ever having been on sulfa drugs for prophylaxis. He does not know his current CD4 count but was told that it was very high and that his virus level was undetectable.  2. Hypertension.  3. Hypercholesterolemia.   SOCIAL HISTORY: The patient lives alone. He does not smoke. He does not drink. No history of injecting drug use. He has no pets at home.   FAMILY HISTORY: Positive for hypertension, colon cancer and breast cancer.    REVIEW OF SYSTEMS: GENERAL: No fevers, chills, or sweats. He has had some generalized fatigue and weakness in the later days of his illness. HEENT: No headaches. No sinus congestion. No sore throat. He has had some dizziness. NECK: No stiffness. No swollen glands. RESPIRATORY: No cough. No shortness of breath. No sputum production. CARDIAC: No chest pains or palpitations. No peripheral edema. GASTROINTESTINAL: Positive nausea, some vomiting. No abdominal pain. Positive diarrhea. No hematuria. No hematochezia. No hematemesis. No coffee-ground emesis. GENITOURINARY: No change in his urine. MUSCULOSKELETAL: He has had generalized weakness. No focal joint or muscle pain. NEUROLOGIC: No focal weakness. No confusion. PSYCHIATRIC: No complaints. All other systems are negative.   PHYSICAL  EXAMINATION:  VITAL SIGNS: T-max 98.7, T-current 97.8, pulse 91, blood pressure 90/59, 97% on room air.   GENERAL: 59 year old black man in no acute distress.   HEENT: Normocephalic, atraumatic. Pupils equal and reactive to light. Extraocular motion intact. Sclerae, conjunctivae, and lids are without evidence for emboli or petechiae. Oropharynx shows no erythema or exudate. Teeth and gums are in good condition.   NECK: Supple.  Full range of motion. Midline trachea. No lymphadenopathy. No thyromegaly.   CHEST: Clear to auscultation bilaterally with good air movement. No focal consolidation.   CARDIAC: Regular rate and rhythm without murmur, rub, or gallop.   ABDOMEN: Soft, nontender, nondistended. No hepatosplenomegaly. No hernia is noted.   EXTREMITIES: No evidence for tenosynovitis.   SKIN: No rashes. No stigmata of endocarditis, specifically no Janeway lesions nor Osler nodes.   NEUROLOGIC: The patient is awake and interactive, moving all four extremities.   PSYCHIATRIC: Mood and affect appeared normal.   LABORATORY, DIAGNOSTIC, AND RADIOLOGICAL DATA: BUN 139, creatinine 7.17, bicarbonate 13, anion gap 8. LFTs are unremarkable. White count 8.1 with a hemoglobin 12.3, platelet count of 391, ANC of 2.5. White count on admission was 11.2. C. difficile PCR is negative. Stool cultures show no growth in 18 to 24 hours. Giardia EIA and O and P are pending. A CD4 count was 963 (24.7%). A renal ultrasound shows a left renal cyst but otherwise normal.   IMPRESSION: A 59 year old black man with a history of HIV infection with a CD4 count greater than 900 who was admitted with severe diarrhea, non-anion gap metabolic acidosis and acute renal failure.   RECOMMENDATIONS:  1. His CD4 count is very high. There is no particular risk for opportunistic infection. C. difficile PCR is negative and stool cultures are negative so far. Giardia and O and P are pending. Viral etiologies are also possible. Given that he has been on the same HAART for four years it is highly unlikely that his antiretrovirals are causing his diarrhea. He has no fever and his white count is not particularly elevated. He is unable to get a contrasted CT due to his renal function.  2. Will continue the Cipro and metronidazole for now. If stool cultures remain negative will stop the Cipro. If the Giardia EIA is negative will stop the metronidazole.  3. Would  continue hydration and following his renal function.  4. Severe lactic acidosis has been associated with HAART, but mainly in the setting of nucleoside reverse transcriptase inhibitors. It has occasionally been associated with proteus inhibitors. Typically this would cause an anion gap metabolic acidosis and his anion gap is normal. Will check a lactic acid level, however, I suspect that his metabolic acidosis is related to his chronic diarrhea and bicarbonate loss. This is currently being replaced.  5. Will restart his HAART if his lactic acid level is okay.   This is a moderately complex infectious disease consult. Thank you very much for involving me in Mr. Dimperio care.   ____________________________ Heinz Knuckles. Ivan Maskell, MD meb:cms D: 11/27/2011 18:33:40 ET T: 11/28/2011 08:37:43 ET  JOB#: [id number] cc: Heinz Knuckles. Amaro Mangold, MD, <Dictator> Hawa Henly E Liridona Mashaw MD ELECTRONICALLY SIGNED 11/30/2011 12:25

## 2014-05-12 NOTE — H&P (Signed)
PATIENT NAME:  Adam Becker, HICKLIN MR#:  [id number] DATE OF BIRTH:  1955/06/18  DATE OF ADMISSION:  11/26/2011  PRIMARY CARE PHYSICIAN: Located in [place].  CHIEF COMPLAINT: Diarrhea for nine days.   HISTORY OF PRESENT ILLNESS: The patient is a 59 year old male with HIV on antiretroviral medication for the last three to four years who came in because the patient has diarrhea for nine days. The patient went to her primary doctor in [place], had stool cultures done, and the results are unknown. The patient sees   ID  dr  in [place]. The patient has been having diarrhea for nine days. Initially the patient also had vomiting for the first three days, but no more vomiting now. Diarrhea has been going on for nine days. He only had six to nine episodes of loose stools, no blood in there, no abdominal pain, and no fever. The patient is unable to keep anything down because of diarrhea. The patient has lost about 15 to 16 pounds in the last two weeks because of poor p.o. intake. The patient denies any recent travel or recent antibiotic use. He denies any recent sick contacts. No fever. Initial blood pressure was 71/53 and heart rate 103. The patient's blood pressure improved during my visit to 109/59 and heart rate around 85. The patient also has acute renal failure with BUN 159 and creatinine 11.20. The patient is unaware of any kidney problems.   PAST MEDICAL HISTORY: HIV, taking medications for the past 3 to 4 years. According to past medical history, the patient says his HIV load is undetectable.   ALLERGIES: No known drug allergies.   SOCIAL HISTORY: No smoking, no drinking, and no drugs. The patient denies any IV drug use. The patient denies any other medical problems, except HIV.   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 The patient does not know why he had a splenectomy.   FAMILY HISTORY: Hypertension in sister.  MEDICATIONS: The patient does not remember his medication list so  pharmacy is going to get the information because he goes to [facility] in [place]. We will update that. The patient denies any recent medication adjustment for HIV.   REVIEW OF SYSTEMS: CONSTITUTIONAL: Has fatigue. Denies any fever. Feels very weak and short of breath for the past 2 to 3 days, but not hypoxic here. EYES: No blurred vision. ENT: No tinnitus. No epistaxis. No difficulty swallowing. RESPIRATORY: No cough. No wheezing. CARDIOVASCULAR: No chest pain. No orthopnea. GI: Has diarrhea ongoing for the past nine days. No nausea. No vomiting. GENITOURINARY: No dysuria, but complains of decreased urination for the past 3 to 4 days. ENDOCRINE: No polydipsia or nocturia. INTEGUMENTARY: No skin rashes. MUSCULOSKELETAL: No joint pains. NEUROLOGIC: No numbness or weakness. PSYCH: No anxiety and insomnia.   PHYSICAL EXAMINATION:   VITAL SIGNS: Temperature 97.5, pulse 98, and respirations 18. Initial blood pressure was 71/53 and repeat blood pressure 101/59.   GENERAL: The patient a well-nourished male with no muscle wasting. The is oriented and able to monitor questions appropriately.   HEENT: Head atraumatic, normocephalic. Pupils are equally reacting to light. Extraocular movements are intact. Mucous membranes are dry.   NECK: No thyroid enlargement. No JVD. No carotids bruits.   CARDIOVASCULAR: S1 and S2 regular. No murmurs.   RESPIRATORY: Lungs are clear to auscultation. No wheeze. No rales. Good pedal pulse, femoral pulse. No extremity edema.   ABDOMEN: Soft, nontender. Bowel sounds present. No hepatomegaly. The patient has had splenectomy. Abdomen is  nontender.   MUSCULOSKELETAL: Strength is five out of five in upper and lower extremities.   SKIN: I do not see any skin rashes.   LYMPH NODES: No lymphadenopathy in cervical or axillary region.  NEURO: Cranial nerves II through XII grossly intact. Power is 5/5 in upper and lower extremities. Sensation is intact. Deep tendon reflexes are 2+  bilaterally.   PSYCH: Mood and affect are within normal limits.   LABORATORY/DIAGNOSTIC DATA: Stool guaiacs were not done. We are going to order that.  White count 11.2, hemoglobin 14.4, hematocrit 41.2, and platelets 411. Troponin less than 0.02. CK total 224. CPK-MB 2.7.   Electrolytes: Sodium 131, potassium 4.3, chloride 109, bicarbonate 9, BUN 159, creatinine 11.20, and glucose 104. LFTs within normal limits. Magnesium 2.3. Phosphorus 12.   EKG shows sinus tach at 101 beats per minute, no ST-T changes.   ASSESSMENT AND PLAN: The patient is a 59 year old male with HIV who came in with acute diarrhea. The patient has no recent travel history, no unprotected sexual intercourse as he lives alone and also has recently done viral load which was undetectable, on HAART medication, and has acute diarrhea with acute renal failure. We need to get the recent HIV viral load from his primary doctor. The patient has acute renal failure secondary to diarrhea, has no kidney problems before, so admit the patient to telemetry, continue IV fluids, check the BMP in the morning, and obtain nephrology consultation. For his diarrhea, we have to look into  comprehensive stool cultures for cryptosporidiosis and specimen is sent for stool comprehensive cultures including ova and parasites. hold PI that because medication induced diarrhea also is a possibility. Check stool for C. difficile as well. Hold off on the CAT scan to evaluate for colitis because of renal insufficiency at this time. If the patient has persistent diarrhea, we will get gastroenterology consultation for endoscopy to evaluate for any lymphoma or mass.  Empirically he is started on Levaquin and Flagyl to treat for possible Giardia or small bowel growth. This is a complex case.  TIME SPENT: About 65 minutes.  ____________________________ Epifanio Lesches, MD sk:slb D: 11/26/2011 10:41:43 ET     T: 11/26/2011 12:19:01 ET         JOB#: [id number] cc: Epifanio Lesches, MD, <Dictator> Epifanio Lesches MD ELECTRONICALLY SIGNED 01/09/2012 14:54

## 2014-05-12 NOTE — Progress Notes (Signed)
  Subjective:    Patient ID: Adam Becker, male    DOB: 29-Jul-1955, 59 y.o.   MRN: [id number]  HPI   59 year old man with HIV on prezista, norvir and 400mg  of intelence once daily with TIvicay 50mg  once daily salvage regimen with good virological control and healthy cd4 count. He has no specific complaints today. He is not sexually active. . He has followed up with Dr. Derrel Nip for PCP.regimen.  Lab Results  Component Value Date   HIV1RNAQUANT <20 04/28/2014   Lab Results  Component Value Date   CD4TABS 1310 04/28/2014   CD4TABS 920 10/22/2013   CD4TABS 1120 03/25/2013     Review of Systems  Constitutional: Negative for fever, chills, diaphoresis, activity change, appetite change, fatigue and unexpected weight change.  HENT: Negative for congestion, rhinorrhea, sinus pressure, sneezing, sore throat and trouble swallowing.   Eyes: Negative for photophobia and visual disturbance.  Respiratory: Negative for cough, chest tightness, shortness of breath, wheezing and stridor.   Cardiovascular: Negative for chest pain, palpitations and leg swelling.  Gastrointestinal: Negative for nausea, vomiting, abdominal pain, diarrhea, constipation, blood in stool, abdominal distention and anal bleeding.  Genitourinary: Negative for dysuria, hematuria, flank pain and difficulty urinating.  Musculoskeletal: Negative for myalgias, back pain, joint swelling, arthralgias and gait problem.  Skin: Negative for color change, pallor, rash and wound.  Neurological: Negative for dizziness, tremors, weakness and light-headedness.  Hematological: Negative for adenopathy. Does not bruise/bleed easily.  Psychiatric/Behavioral: Negative for behavioral problems, confusion, sleep disturbance, dysphoric mood, decreased concentration and agitation.       Objective:   Physical Exam  Constitutional: He is oriented to person, place, and time. He appears well-developed and well-nourished. No distress.  HENT:  Head:  Normocephalic and atraumatic.  Mouth/Throat: Oropharynx is clear and moist. No oropharyngeal exudate.  Eyes: Conjunctivae and EOM are normal. Pupils are equal, round, and reactive to light. No scleral icterus.  Neck: Normal range of motion. Neck supple. No JVD present.  Cardiovascular: Normal rate, regular rhythm and normal heart sounds.  Exam reveals no gallop and no friction rub.   No murmur heard. Pulmonary/Chest: Effort normal and breath sounds normal. No respiratory distress. He has no wheezes. He has no rales. He exhibits no tenderness.  Abdominal: He exhibits no distension and no mass. There is no tenderness. There is no rebound and no guarding.  Musculoskeletal: He exhibits no edema or tenderness.  Lymphadenopathy:    He has no cervical adenopathy.  Neurological: He is alert and oriented to person, place, and time. He exhibits normal muscle tone. Coordination normal.  Skin: Skin is warm and dry. He is not diaphoretic. No erythema. No pallor.  Psychiatric: He has a normal mood and affect. His behavior is normal. Judgment and thought content normal.          Assessment & Plan:  HIV: continue current salvage regimen.prezista, Norvir Tivicay and Intelence. Due to intelence lowering of DTG we need the Norvir to raise its levels and COBI not enough to suffice here.  Once Descovy becomes available may change him instead to  [place]  ,[place] and Descovy and have same number of active agents without messiess of drug drug interactions to deal with.   HTN: followed with Dr. Derrel Nip  Hyperlipidemia: at goal   HCM: flu shot  Asplenia: still Needs redose of menactra and neglected to give this today again

## 2014-05-27 ENCOUNTER — Other Ambulatory Visit: Payer: Self-pay | Admitting: Internal Medicine

## 2014-07-13 ENCOUNTER — Other Ambulatory Visit: Payer: Self-pay | Admitting: Infectious Disease

## 2014-07-17 ENCOUNTER — Other Ambulatory Visit: Payer: Self-pay | Admitting: Internal Medicine

## 2014-07-20 ENCOUNTER — Other Ambulatory Visit: Payer: Self-pay | Admitting: Internal Medicine

## 2014-08-13 ENCOUNTER — Other Ambulatory Visit: Payer: Self-pay | Admitting: Internal Medicine

## 2014-08-13 NOTE — Telephone Encounter (Signed)
Last OV 3.29.16, last refill 6.23.16.  Please advise refill

## 2014-08-14 NOTE — Telephone Encounter (Signed)
Ok to refill,  Refill sent  

## 2014-08-16 ENCOUNTER — Other Ambulatory Visit: Payer: Self-pay | Admitting: Internal Medicine

## 2014-08-16 ENCOUNTER — Other Ambulatory Visit: Payer: Self-pay | Admitting: Infectious Disease

## 2014-08-17 NOTE — Telephone Encounter (Signed)
Ok to fil meloxicam?

## 2014-08-19 NOTE — Telephone Encounter (Signed)
Ok to refill,  Refill sent  

## 2014-09-27 ENCOUNTER — Other Ambulatory Visit: Payer: Self-pay | Admitting: Infectious Disease

## 2014-09-27 DIAGNOSIS — B20 Human immunodeficiency virus [HIV] disease: Secondary | ICD-10-CM

## 2014-09-29 ENCOUNTER — Telehealth: Payer: Self-pay | Admitting: *Deleted

## 2014-09-29 NOTE — Telephone Encounter (Signed)
Pt requesting refills - approved. Left message, patient needs an appointment. Landis Gandy, RN

## 2014-10-29 ENCOUNTER — Other Ambulatory Visit: Payer: Medicare Other

## 2014-10-29 ENCOUNTER — Other Ambulatory Visit (HOSPITAL_COMMUNITY)
Admission: RE | Admit: 2014-10-29 | Discharge: 2014-10-29 | Disposition: A | Discharge status: Home / Self Care | Payer: Medicare Other | Source: Ambulatory Visit | Attending: Infectious Disease | Admitting: Infectious Disease

## 2014-10-29 DIAGNOSIS — B20 Human immunodeficiency virus [HIV] disease: Secondary | ICD-10-CM | POA: Diagnosis not present

## 2014-10-29 DIAGNOSIS — E785 Hyperlipidemia, unspecified: Secondary | ICD-10-CM

## 2014-10-29 DIAGNOSIS — Z113 Encounter for screening for infections with a predominantly sexual mode of transmission: Secondary | ICD-10-CM | POA: Insufficient documentation

## 2014-10-29 LAB — CBC WITH DIFFERENTIAL/PLATELET
Basophils Absolute: 0.1 10*3/uL (ref 0.0–0.1)
Basophils Relative: 1 % (ref 0–1)
Eosinophils Absolute: 0.4 10*3/uL (ref 0.0–0.7)
Eosinophils Relative: 6 % — ABNORMAL HIGH (ref 0–5)
HCT: 40.5 % (ref 39.0–52.0)
Hemoglobin: 14.4 g/dL (ref 13.0–17.0)
Lymphocytes Relative: 53 % — ABNORMAL HIGH (ref 12–46)
Lymphs Abs: 3.2 10*3/uL (ref 0.7–4.0)
MCH: 33.3 pg (ref 26.0–34.0)
MCHC: 35.6 g/dL (ref 30.0–36.0)
MCV: 93.5 fL (ref 78.0–100.0)
MPV: 9 fL (ref 8.6–12.4)
Monocytes Absolute: 0.5 10*3/uL (ref 0.1–1.0)
Monocytes Relative: 9 % (ref 3–12)
Neutro Abs: 1.9 10*3/uL (ref 1.7–7.7)
Neutrophils Relative %: 31 % — ABNORMAL LOW (ref 43–77)
Platelets: 377 10*3/uL (ref 150–400)
RBC: 4.33 MIL/uL (ref 4.22–5.81)
RDW: 14.1 % (ref 11.5–15.5)
WBC: 6 10*3/uL (ref 4.0–10.5)

## 2014-10-29 LAB — LIPID PANEL
Cholesterol: 141 mg/dL (ref 125–200)
HDL: 36 mg/dL — ABNORMAL LOW (ref >=40)
LDL Cholesterol: 89 mg/dL (ref <130)
Total CHOL/HDL Ratio: 3.9 Ratio (ref <=5.0)
Triglycerides: 81 mg/dL (ref <150)
VLDL: 16 mg/dL (ref <30)

## 2014-10-29 LAB — COMPLETE METABOLIC PANEL WITH GFR
ALT: 41 U/L (ref 9–46)
AST: 33 U/L (ref 10–35)
Albumin: 4.3 g/dL (ref 3.6–5.1)
Alkaline Phosphatase: 59 U/L (ref 40–115)
BUN: 11 mg/dL (ref 7–25)
CO2: 28 mmol/L (ref 20–31)
Calcium: 9.4 mg/dL (ref 8.6–10.3)
Chloride: 104 mmol/L (ref 98–110)
Creat: 1.21 mg/dL (ref 0.70–1.33)
GFR, Est African American: 76 mL/min (ref >=60)
GFR, Est Non African American: 66 mL/min (ref >=60)
Glucose, Bld: 84 mg/dL (ref 65–99)
Potassium: 4.4 mmol/L (ref 3.5–5.3)
Sodium: 140 mmol/L (ref 135–146)
Total Bilirubin: 0.4 mg/dL (ref 0.2–1.2)
Total Protein: 7.5 g/dL (ref 6.1–8.1)

## 2014-10-30 LAB — URINE CYTOLOGY ANCILLARY ONLY
Chlamydia: NEGATIVE
Neisseria Gonorrhea: NEGATIVE

## 2014-10-30 LAB — HIV-1 RNA QUANT-NO REFLEX-BLD
HIV 1 RNA Quant: <20 copies/mL (ref <20)
HIV-1 RNA Quant, Log: <1.3 {Log} (ref <1.30)

## 2014-10-30 LAB — RPR TITER: RPR Titer: 1:2 {titer}

## 2014-10-30 LAB — MICROALBUMIN / CREATININE URINE RATIO
Creatinine, Urine: 388.6 mg/dL
Microalb Creat Ratio: 121.2 mg/g — ABNORMAL HIGH (ref 0.0–30.0)
Microalb, Ur: 47.1 mg/dL — ABNORMAL HIGH (ref <2.0)

## 2014-10-30 LAB — T-HELPER CELL (CD4) - (RCID CLINIC ONLY)
CD4 % Helper T Cell: 27 % — ABNORMAL LOW (ref 33–55)
CD4 T Cell Abs: 930 /uL (ref 400–2700)

## 2014-10-30 LAB — FLUORESCENT TREPONEMAL AB(FTA)-IGG-BLD: Fluorescent Treponemal ABS: REACTIVE — AB

## 2014-10-30 LAB — RPR: RPR Ser Ql: REACTIVE — AB

## 2014-10-30 NOTE — Addendum Note (Signed)
Addended by: Dolan Amen D on: 10/30/2014 10:48 AM   Modules accepted: Orders

## 2014-11-11 ENCOUNTER — Telehealth: Payer: Self-pay | Admitting: *Deleted

## 2014-11-11 ENCOUNTER — Encounter: Payer: Self-pay | Admitting: Infectious Disease

## 2014-11-11 ENCOUNTER — Ambulatory Visit (INDEPENDENT_AMBULATORY_CARE_PROVIDER_SITE_OTHER): Payer: Medicare Other | Admitting: Infectious Disease

## 2014-11-11 VITALS — BP 165/102 | HR 68 | Temp 97.9°F | Ht 71.0 in | Wt 234.0 lb

## 2014-11-11 DIAGNOSIS — B20 Human immunodeficiency virus [HIV] disease: Secondary | ICD-10-CM | POA: Diagnosis not present

## 2014-11-11 DIAGNOSIS — I10 Essential (primary) hypertension: Secondary | ICD-10-CM | POA: Diagnosis not present

## 2014-11-11 DIAGNOSIS — N182 Chronic kidney disease, stage 2 (mild): Secondary | ICD-10-CM | POA: Diagnosis not present

## 2014-11-11 DIAGNOSIS — A523 Neurosyphilis, unspecified: Secondary | ICD-10-CM | POA: Diagnosis not present

## 2014-11-11 DIAGNOSIS — Q8901 Asplenia (congenital): Secondary | ICD-10-CM

## 2014-11-11 DIAGNOSIS — Z23 Encounter for immunization: Secondary | ICD-10-CM

## 2014-11-11 HISTORY — DX: Asplenia (congenital): Q89.01

## 2014-11-11 MED ORDER — ELVITEG-COBIC-EMTRICIT-TENOFAF 150-150-200-10 MG PO TABS: 1.0000 | ORAL_TABLET | Freq: Every day | ORAL | Status: DC

## 2014-11-11 NOTE — Patient Instructions (Signed)
We are switching your regimen to GENVOYA and PRezista  I would liketo have you repeat your labs in 4 weeks and then see Cassie or Minh with PHARMACY about a week afterwards  RTC to See Dr. [name] in 6 months

## 2014-11-11 NOTE — Progress Notes (Signed)
Chief complaint followup for HIV on medications Subjective:    Patient ID: Adam Becker, male    DOB: 07/29/1955, 59 y.o.   MRN: [id number]  HPI   59 year old man with HIV on prezista, norvir and 400mg  of intelence once daily with TIvicay 50mg  once daily salvage regimen with good virological control and healthy cd4 count. He has no specific complaints today. He has followed up with Dr. Derrel Nip for PCP.regimen.  Lab Results  Component Value Date   HIV1RNAQUANT <20 10/29/2014   Lab Results  Component Value Date   CD4TABS 930 10/29/2014   CD4TABS 1310 04/28/2014   CD4TABS 920 10/22/2013   Past Medical History  Diagnosis Date  . HIV infection ([place])   . Hypertension   . Hyperlipidemia   . Chronic kidney disease Nov 2013    admission for ATN cr 11, acidosis    Past Surgical History  Procedure Laterality Date  . Splenectomy  2000    reason unclear "stopped working"  . Colonoscopy      Family History  Problem Relation Age of Onset  . Cancer Mother   . Breast cancer Mother   . Colon cancer Father   . Birth defects Son     Breast       Social History   Social History  . Marital Status: Single    Spouse Name: N/A  . Number of Children: N/A  . Years of Education: N/A   Social History Main Topics  . Smoking status: Never Smoker   . Smokeless tobacco: Never Used  . Alcohol Use: No  . Drug Use: No  . Sexual Activity: Not Currently     Comment: declined condoms   Other Topics Concern  . None   Social History Narrative    No Known Allergies   Current outpatient prescriptions:  .  allopurinol (ZYLOPRIM) 100 MG tablet, TAKE 2 TABLETS (200 MG TOTAL) BY MOUTH DAILY., Disp: 180 tablet, Rfl: 1 .  lisinopril (PRINIVIL,ZESTRIL) 10 MG tablet, TAKE 1 TABLET (10 MG TOTAL) BY MOUTH DAILY. NEEDS APPT BEFORE 10/31/13 FOR FURTHER REFILLS, Disp: 30 tablet, Rfl: 0 .  pravastatin (PRAVACHOL) 40 MG tablet, TAKE 1 TABLET BY MOUTH EVERY DAY, Disp: 30 tablet, Rfl: 6 .  PREZISTA  800 MG tablet, TAKE 1 TABLET BY MOUTH DAILY, Disp: 30 tablet, Rfl: 5 .  elvitegravir-cobicistat-emtricitabine-tenofovir (GENVOYA) 150-150-200-10 MG TABS tablet, Take 1 tablet by mouth daily with breakfast., Disp: 30 tablet, Rfl: 11   Review of Systems  Constitutional: Negative for fever, chills, diaphoresis, activity change, appetite change, fatigue and unexpected weight change.  HENT: Negative for congestion, rhinorrhea, sinus pressure, sneezing, sore throat and trouble swallowing.   Eyes: Negative for photophobia and visual disturbance.  Respiratory: Negative for cough, chest tightness, shortness of breath, wheezing and stridor.   Cardiovascular: Negative for chest pain, palpitations and leg swelling.  Gastrointestinal: Negative for nausea, vomiting, abdominal pain, diarrhea, constipation, blood in stool, abdominal distention and anal bleeding.  Genitourinary: Negative for dysuria, hematuria, flank pain and difficulty urinating.  Musculoskeletal: Negative for myalgias, back pain, joint swelling, arthralgias and gait problem.  Skin: Negative for color change, pallor, rash and wound.  Neurological: Negative for dizziness, tremors, weakness and light-headedness.  Hematological: Negative for adenopathy. Does not bruise/bleed easily.  Psychiatric/Behavioral: Negative for behavioral problems, confusion, sleep disturbance, dysphoric mood, decreased concentration and agitation.       Objective:   Physical Exam  Constitutional: He is oriented to person, place, and time. He appears  well-developed and well-nourished. No distress.  HENT:  Head: Normocephalic and atraumatic.  Mouth/Throat: Oropharynx is clear and moist. No oropharyngeal exudate.  Eyes: Conjunctivae and EOM are normal. Pupils are equal, round, and reactive to light. No scleral icterus.  Neck: Normal range of motion. Neck supple. No JVD present.  Cardiovascular: Normal rate, regular rhythm and normal heart sounds.  Exam reveals no  gallop and no friction rub.   No murmur heard. Pulmonary/Chest: Effort normal and breath sounds normal. No respiratory distress. He has no wheezes. He has no rales. He exhibits no tenderness.  Abdominal: He exhibits no distension and no mass. There is no tenderness. There is no rebound and no guarding.  Musculoskeletal: He exhibits no edema or tenderness.  Lymphadenopathy:    He has no cervical adenopathy.  Neurological: He is alert and oriented to person, place, and time. He exhibits normal muscle tone. Coordination normal.  Skin: Skin is warm and dry. He is not diaphoretic. No erythema. No pallor.  Psychiatric: He has a normal mood and affect. His behavior is normal. Judgment and thought content normal.          Assessment & Plan:  HIV: [name], Pharm D and I reviewed his prior R as well as drug drug interactions. INtelence was only partially active and was lowering the levels of DTG. DTG was fully active. Prezista also fully active.   We opted to switch to [place] with fully active Elvitegravir, fully active Prezista and partially active Tenofovir TAF with sensitization to the drug by giving it with emtricitabine. There are also no deleterious drug intreactions here. It also reduces his pill burden from 5 pills to 2 pills. I fhe does not toerate this regimen then switch to Tivicay, Descovy and Prezcobix. Recheck labs in 4 weeks and fu with ID pharmacy in 5 weeks.   HTN: followed with Dr. Derrel Nip  Hyperlipidemia: at goal  Syphilis: titers appropriate:    Asplenia: still Needs redose of menactra  CKD with AKI in the past an episode where we took away TNF TDF. TNF-TAF will not have renal risk  We  spent greater than 25 minutes with the patient including greater than 50% of time in face to face counsel of the patient's HIV, and his new ARV regimen and in coordination of their care.

## 2014-11-11 NOTE — Telephone Encounter (Signed)
Patient called for clarification of his HIV medication. He advised when he went to the pharmacy he was only given 1 medication and just wants to make sure that was correct. Advised will ask his doctor and give him a call back asap.  Verified Rx with the Dr [name] and patient should be taking Prezista and Genvoya.  Patient notified.

## 2014-11-12 ENCOUNTER — Other Ambulatory Visit: Payer: Self-pay | Admitting: Internal Medicine

## 2014-11-12 ENCOUNTER — Telehealth: Payer: Self-pay | Admitting: Internal Medicine

## 2014-11-12 DIAGNOSIS — I10 Essential (primary) hypertension: Secondary | ICD-10-CM

## 2014-11-12 MED ORDER — LISINOPRIL-HYDROCHLOROTHIAZIDE 20-25 MG PO TABS: 1.0000 | ORAL_TABLET | Freq: Every day | ORAL | Status: DC

## 2014-11-12 NOTE — Telephone Encounter (Signed)
Pt called about his BP being high today BP was 159/108 [facility]. Pt wants to know if his BP medication needs to raise or what he needs to do to lower his BP? Thank You!

## 2014-11-12 NOTE — Telephone Encounter (Signed)
Med changed to lisinopril hct.  One tablet  daily in am,.  [facility]  For NV and Basic labs in 1 week

## 2014-11-12 NOTE — Telephone Encounter (Signed)
Spoke with patient and scheduled labs.

## 2014-11-12 NOTE — Telephone Encounter (Signed)
Per chart patient's BP runs 120-160's SBP.  Last OV with infectious disease it was high 165/102.   Please advise?

## 2014-11-16 ENCOUNTER — Encounter: Payer: Self-pay | Admitting: Internal Medicine

## 2014-11-19 ENCOUNTER — Other Ambulatory Visit: Payer: Medicare Other

## 2014-11-19 ENCOUNTER — Other Ambulatory Visit (INDEPENDENT_AMBULATORY_CARE_PROVIDER_SITE_OTHER): Payer: Medicare Other

## 2014-11-19 ENCOUNTER — Ambulatory Visit (INDEPENDENT_AMBULATORY_CARE_PROVIDER_SITE_OTHER): Payer: Medicare Other

## 2014-11-19 VITALS — BP 138/78 | HR 72 | Resp 18

## 2014-11-19 DIAGNOSIS — I10 Essential (primary) hypertension: Secondary | ICD-10-CM

## 2014-11-19 DIAGNOSIS — Z125 Encounter for screening for malignant neoplasm of prostate: Secondary | ICD-10-CM

## 2014-11-19 LAB — MICROALBUMIN / CREATININE URINE RATIO
Creatinine,U: 125.9 mg/dL
Microalb Creat Ratio: 3.8 mg/g (ref 0.0–30.0)
Microalb, Ur: 4.8 mg/dL — ABNORMAL HIGH (ref 0.0–1.9)

## 2014-11-19 LAB — BASIC METABOLIC PANEL
BUN: 16 mg/dL (ref 6–23)
CO2: 27 mEq/L (ref 19–32)
Calcium: 9.6 mg/dL (ref 8.4–10.5)
Chloride: 102 mEq/L (ref 96–112)
Creatinine, Ser: 1.43 mg/dL (ref 0.40–1.50)
GFR: 65.29 mL/min (ref >60.00)
Glucose, Bld: 94 mg/dL (ref 70–99)
Potassium: 4.6 mEq/L (ref 3.5–5.1)
Sodium: 139 mEq/L (ref 135–145)

## 2014-11-19 LAB — PSA, MEDICARE: PSA: 0.61 ng/ml (ref 0.10–4.00)

## 2014-11-19 NOTE — Progress Notes (Addendum)
Patient came in for a BP recheck.  Was started on Lisinopril-HCT last week.  Patient reports no issues since taking it.  Has checked his BP intermittently and it was different each time, no recorded numbers for me today.  Checked in bilaterally upper extremities.  See vitals for documentation.  Please advise any changes.  Patient also had labs today,as a FYI.     I have reviewed the above information and agree with above.  No changes today  Deborra Medina, MD

## 2014-11-21 ENCOUNTER — Other Ambulatory Visit: Payer: Self-pay | Admitting: Internal Medicine

## 2014-11-21 ENCOUNTER — Other Ambulatory Visit: Payer: Self-pay | Admitting: Infectious Disease

## 2014-11-23 ENCOUNTER — Encounter: Payer: Self-pay | Admitting: *Deleted

## 2014-11-23 NOTE — Telephone Encounter (Signed)
Last OV was 3/29? Please advise?

## 2014-11-25 NOTE — Telephone Encounter (Signed)
Ok to refill,  Refill sent  

## 2014-12-10 ENCOUNTER — Other Ambulatory Visit (HOSPITAL_COMMUNITY)
Admission: RE | Admit: 2014-12-10 | Discharge: 2014-12-10 | Disposition: A | Discharge status: Home / Self Care | Payer: Medicare Other | Source: Ambulatory Visit | Attending: Infectious Disease | Admitting: Infectious Disease

## 2014-12-10 ENCOUNTER — Other Ambulatory Visit: Payer: Medicare Other

## 2014-12-10 DIAGNOSIS — Z113 Encounter for screening for infections with a predominantly sexual mode of transmission: Secondary | ICD-10-CM | POA: Diagnosis not present

## 2014-12-10 DIAGNOSIS — B20 Human immunodeficiency virus [HIV] disease: Secondary | ICD-10-CM

## 2014-12-10 LAB — COMPLETE METABOLIC PANEL WITH GFR
ALT: 37 U/L (ref 9–46)
AST: 31 U/L (ref 10–35)
Albumin: 4.1 g/dL (ref 3.6–5.1)
Alkaline Phosphatase: 59 U/L (ref 40–115)
BUN: 22 mg/dL (ref 7–25)
CO2: 26 mmol/L (ref 20–31)
Calcium: 9.8 mg/dL (ref 8.6–10.3)
Chloride: 102 mmol/L (ref 98–110)
Creat: 1.33 mg/dL (ref 0.70–1.33)
GFR, Est African American: 68 mL/min (ref >=60)
GFR, Est Non African American: 59 mL/min — ABNORMAL LOW (ref >=60)
Glucose, Bld: 121 mg/dL — ABNORMAL HIGH (ref 65–99)
Potassium: 4.2 mmol/L (ref 3.5–5.3)
Sodium: 138 mmol/L (ref 135–146)
Total Bilirubin: 0.3 mg/dL (ref 0.2–1.2)
Total Protein: 7.8 g/dL (ref 6.1–8.1)

## 2014-12-10 LAB — CBC WITH DIFFERENTIAL/PLATELET
Basophils Absolute: 0.1 10*3/uL (ref 0.0–0.1)
Basophils Relative: 1 % (ref 0–1)
Eosinophils Absolute: 0.6 10*3/uL (ref 0.0–0.7)
Eosinophils Relative: 7 % — ABNORMAL HIGH (ref 0–5)
HCT: 42 % (ref 39.0–52.0)
Hemoglobin: 14.4 g/dL (ref 13.0–17.0)
Lymphocytes Relative: 49 % — ABNORMAL HIGH (ref 12–46)
Lymphs Abs: 4.1 10*3/uL — ABNORMAL HIGH (ref 0.7–4.0)
MCH: 32 pg (ref 26.0–34.0)
MCHC: 34.3 g/dL (ref 30.0–36.0)
MCV: 93.3 fL (ref 78.0–100.0)
MPV: 8.7 fL (ref 8.6–12.4)
Monocytes Absolute: 0.8 10*3/uL (ref 0.1–1.0)
Monocytes Relative: 10 % (ref 3–12)
Neutro Abs: 2.8 10*3/uL (ref 1.7–7.7)
Neutrophils Relative %: 33 % — ABNORMAL LOW (ref 43–77)
Platelets: 405 10*3/uL — ABNORMAL HIGH (ref 150–400)
RBC: 4.5 MIL/uL (ref 4.22–5.81)
RDW: 14.1 % (ref 11.5–15.5)
WBC: 8.4 10*3/uL (ref 4.0–10.5)

## 2014-12-10 LAB — RPR TITER: RPR Titer: 1:2 {titer}

## 2014-12-10 LAB — RPR: RPR Ser Ql: REACTIVE — AB

## 2014-12-11 LAB — URINE CYTOLOGY ANCILLARY ONLY
Chlamydia: NEGATIVE
Neisseria Gonorrhea: NEGATIVE

## 2014-12-11 LAB — FLUORESCENT TREPONEMAL AB(FTA)-IGG-BLD: Fluorescent Treponemal ABS: REACTIVE — AB

## 2014-12-11 LAB — T-HELPER CELL (CD4) - (RCID CLINIC ONLY)
CD4 % Helper T Cell: 31 % — ABNORMAL LOW (ref 33–55)
CD4 T Cell Abs: 1290 /uL (ref 400–2700)

## 2014-12-13 LAB — HIV-1 RNA QUANT-NO REFLEX-BLD
HIV 1 RNA Quant: <20 copies/mL (ref <20)
HIV-1 RNA Quant, Log: <1.3 Log copies/mL (ref <1.30)

## 2014-12-24 ENCOUNTER — Ambulatory Visit (INDEPENDENT_AMBULATORY_CARE_PROVIDER_SITE_OTHER): Payer: Medicare Other | Admitting: Pharmacist Clinician (PhC)/ Clinical Pharmacy Specialist

## 2014-12-24 DIAGNOSIS — B20 Human immunodeficiency virus [HIV] disease: Secondary | ICD-10-CM | POA: Diagnosis not present

## 2014-12-24 NOTE — Progress Notes (Signed)
Patient ID: Adam Becker, male   DOB: [birth year], 59 y.o.   MRN: [id number] HPI: JARET BEHN is a 59 y.o. male who is here for his pharmacy f/u visit.  Allergies: No Known Allergies  Vitals:    Past Medical History: Past Medical History  Diagnosis Date  . HIV infection ([place])   . Hypertension   . Hyperlipidemia   . Chronic kidney disease Nov 2013    admission for ATN cr 11, acidosis  . Asplenia 11/11/2014    Social History: Social History   Social History  . Marital Status: Single    Spouse Name: N/A  . Number of Children: N/A  . Years of Education: N/A   Social History Main Topics  . Smoking status: Never Smoker   . Smokeless tobacco: Never Used  . Alcohol Use: No  . Drug Use: No  . Sexual Activity: Not Currently     Comment: declined condoms   Other Topics Concern  . Not on file   Social History Narrative    Previous Regimen: ETR,DRV/r,DTG  Current Regimen: Genvoya + DRV  Labs: HIV 1 RNA QUANT (copies/mL)  Date Value  12/10/2014 <20  10/29/2014 <20  04/28/2014 <20   CD4 T CELL ABS (/uL)  Date Value  12/10/2014 1290  10/29/2014 930  04/28/2014 1310   HEP B S AB (no units)  Date Value  03/19/2006 No   HEPATITIS B SURFACE AG (no units)  Date Value  03/19/2006 No   HCV AB (no units)  Date Value  10/22/2013 NEGATIVE    CrCl: CrCl cannot be calculated (Unknown ideal weight.).  Lipids:    Component Value Date/Time   CHOL 141 10/29/2014 1037   TRIG 81 10/29/2014 1037   HDL 36* 10/29/2014 1037   CHOLHDL 3.9 10/29/2014 1037   VLDL 16 10/29/2014 1037   LDLCALC 89 10/29/2014 1037   HIV Genotype Composite Data Genotype Dates:   Mutations in Bold impact drug susceptibility RT Mutations G190EQ, M41L, D67N, K70R, L74V, V75A, M184V, T215Y, K219Q  PI Mutations D30N, N88D  Integrase Mutations    Interpretation of Genotype Data per [place] HIV Database Nucleoside RTIs  abacavir (ABC) High-Level Resistance zidovudine  (AZT) High-Level Resistance stavudine (D4T) High-Level Resistance didanosine (DDI) High-Level Resistance emtricitabine (FTC) High-Level Resistance lamivudine (3TC) High-Level Resistance tenofovir (TDF) Intermediate Resistance   Non-Nucleoside RTIs  efavirenz (EFV) High-Level Resistance etravirine (ETR) Intermediate Resistance nevirapine (NVP) High-Level Resistance rilpivirine (RPV) High-Level Resistance   Protease Inhibitors  atazanavir/r (ATV/r) Potential Low-Level Resistance darunavir/r (DRV/r) Susceptible fosamprenavir/r (FPV/r) Susceptible indinavir/r (IDV/r) Susceptible lopinavir/r (LPV/r) Susceptible saquinavir/r (SQV/r) Potential Low-Level Resistance tipranavir/r (TPV/r) Susceptible   Integrase Inhibitors  None   Assessment: 59 yo who saw Dr. [name] in mid Oct and his ART was simplify to [facility] + DRV. I believe there was some issue with Genvoya with his insurance at the time and concerns were raised about whether he is taking on or both. He has a tremendous amount of resistance. See table above. He is very well controlled now. He stated that he has not missed any doses of meds. I cont to urge him to be compliance and show him his controlled VL in chart. [facility] and confirm that he is on Genvoya + DRV.   Recommendations: Cont Genvova 1 PO qday Cont DRV 800mg  PO qday  Wilfred Lacy, PharmD Clinical Infectious [facility] 12/24/2014, 9:25 AM

## 2015-01-12 ENCOUNTER — Other Ambulatory Visit: Payer: Self-pay | Admitting: Internal Medicine

## 2015-01-17 ENCOUNTER — Other Ambulatory Visit: Payer: Self-pay | Admitting: Infectious Disease

## 2015-02-09 ENCOUNTER — Telehealth: Payer: Self-pay | Admitting: *Deleted

## 2015-02-09 ENCOUNTER — Other Ambulatory Visit: Payer: Self-pay | Admitting: Infectious Disease

## 2015-02-09 DIAGNOSIS — Z21 Asymptomatic human immunodeficiency virus [HIV] infection status: Secondary | ICD-10-CM

## 2015-02-09 NOTE — Telephone Encounter (Signed)
Patient called c/o penile discharge. Has an appointment for labs on Thursday and I added a urine cytology to check for STD. Myrtis Hopping

## 2015-02-11 ENCOUNTER — Encounter: Payer: Self-pay | Admitting: Infectious Disease

## 2015-02-11 ENCOUNTER — Ambulatory Visit (INDEPENDENT_AMBULATORY_CARE_PROVIDER_SITE_OTHER): Payer: Medicare Other | Admitting: Infectious Disease

## 2015-02-11 ENCOUNTER — Other Ambulatory Visit (HOSPITAL_COMMUNITY)
Admission: RE | Admit: 2015-02-11 | Discharge: 2015-02-11 | Disposition: A | Discharge status: Home / Self Care | Payer: Medicare Other | Source: Ambulatory Visit | Attending: Infectious Disease | Admitting: Infectious Disease

## 2015-02-11 ENCOUNTER — Other Ambulatory Visit: Payer: Medicare Other

## 2015-02-11 VITALS — BP 130/85 | HR 85 | Temp 97.5°F | Wt 239.0 lb

## 2015-02-11 DIAGNOSIS — I129 Hypertensive chronic kidney disease with stage 1 through stage 4 chronic kidney disease, or unspecified chronic kidney disease: Secondary | ICD-10-CM

## 2015-02-11 DIAGNOSIS — B20 Human immunodeficiency virus [HIV] disease: Secondary | ICD-10-CM | POA: Diagnosis not present

## 2015-02-11 DIAGNOSIS — A528 Late syphilis, latent: Secondary | ICD-10-CM

## 2015-02-11 DIAGNOSIS — A64 Unspecified sexually transmitted disease: Secondary | ICD-10-CM

## 2015-02-11 DIAGNOSIS — Z113 Encounter for screening for infections with a predominantly sexual mode of transmission: Secondary | ICD-10-CM | POA: Diagnosis present

## 2015-02-11 DIAGNOSIS — N182 Chronic kidney disease, stage 2 (mild): Secondary | ICD-10-CM | POA: Diagnosis not present

## 2015-02-11 DIAGNOSIS — E785 Hyperlipidemia, unspecified: Secondary | ICD-10-CM

## 2015-02-11 DIAGNOSIS — I10 Essential (primary) hypertension: Secondary | ICD-10-CM

## 2015-02-11 DIAGNOSIS — N342 Other urethritis: Secondary | ICD-10-CM

## 2015-02-11 DIAGNOSIS — A523 Neurosyphilis, unspecified: Secondary | ICD-10-CM | POA: Diagnosis not present

## 2015-02-11 HISTORY — DX: Other urethritis: N34.2

## 2015-02-11 NOTE — Patient Instructions (Signed)
Labs today  Antibiotics today  We will need you to come back for repeat urine test in 2 months after todays  Fu appt with full lab panel and seeing Dr. [name] in 6 months

## 2015-02-11 NOTE — Progress Notes (Signed)
Subjective:  Chief complaint: urethral discharge   Patient ID: Adam Becker, male    DOB: [birth year], 60 y.o.   MRN: [id number]  HPI   60 year old man with HIV currently well controlled on Genvoya with Prezista.   Lab Results  Component Value Date   HIV1RNAQUANT <20 12/10/2014   Lab Results  Component Value Date   CD4TABS 1290 12/10/2014   CD4TABS 930 10/29/2014   CD4TABS 1310 04/28/2014     He has had recent urethral discharge. When he has sex he is a "top" and never a "bottom." He has not performed much oral sex over several years.  We discussed rechecking urine for GC and chlamydia today and give him rocephin and azithromycin and also check him for syphilis titer.    Past Medical History  Diagnosis Date  . HIV infection ([place])   . Hypertension   . Hyperlipidemia   . Chronic kidney disease Nov 2013    admission for [facility] cr 11, acidosis  . 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 Urethritis 02/11/2015    Past Surgical History  Procedure Laterality Date  . Splenectomy  2000    reason unclear "stopped working"  . Colonoscopy      Family History  Problem Relation Age of Onset  . Cancer Mother   . Breast cancer Mother   . Colon cancer Father   . Birth defects Son     Breast       Social History   Social History  . Marital Status: Single    Spouse Name: N/A  . Number of Children: N/A  . Years of Education: N/A   Social History Main Topics  . Smoking status: Never Smoker   . Smokeless tobacco: Never Used  . Alcohol Use: No  . Drug Use: No  . Sexual Activity: Not Currently     Comment: declined condoms   Other Topics Concern  . Not on file   Social History Narrative    No Known Allergies   Current outpatient prescriptions:  .  allopurinol (ZYLOPRIM) 100 MG tablet, TAKE 2 TABLETS (200 MG TOTAL) BY MOUTH DAILY., Disp: 180 tablet, Rfl: 1 .  allopurinol (ZYLOPRIM) 100 MG tablet, TAKE 2 TABLETS (200 MG TOTAL) BY MOUTH DAILY., Disp: 180 tablet, Rfl: 1 .   elvitegravir-cobicistat-emtricitabine-tenofovir (GENVOYA) 150-150-200-10 MG TABS tablet, Take 1 tablet by mouth daily with breakfast., Disp: 30 tablet, Rfl: 11 .  lisinopril-hydrochlorothiazide (PRINZIDE,ZESTORETIC) 20-25 MG tablet, Take 1 tablet by mouth daily., Disp: 30 tablet, Rfl: 3 .  meloxicam (MOBIC) 15 MG tablet, TAKE 1 TABLET (15 MG TOTAL) BY MOUTH DAILY., Disp: 30 tablet, Rfl: 2 .  pravastatin (PRAVACHOL) 40 MG tablet, TAKE 1 TABLET BY MOUTH EVERY DAY, Disp: 30 tablet, Rfl: 5 .  PREZISTA 800 MG tablet, TAKE 1 TABLET BY MOUTH DAILY, Disp: 30 tablet, Rfl: 5   Review of Systems  Constitutional: Negative for fever, chills, diaphoresis, activity change, appetite change, fatigue and unexpected weight change.  HENT: Negative for congestion, rhinorrhea, sinus pressure, sneezing, sore throat and trouble swallowing.   Eyes: Negative for photophobia and visual disturbance.  Respiratory: Negative for cough, chest tightness, shortness of breath, wheezing and stridor.   Cardiovascular: Negative for chest pain, palpitations and leg swelling.  Gastrointestinal: Negative for nausea, vomiting, abdominal pain, diarrhea, constipation, blood in stool, abdominal distention and anal bleeding.  Genitourinary: Negative for dysuria, hematuria, flank pain and difficulty urinating.  Musculoskeletal: Negative for myalgias, back pain, joint swelling, arthralgias and gait problem.  Skin:  Negative for color change, pallor, rash and wound.  Neurological: Negative for dizziness, tremors, weakness and light-headedness.  Hematological: Negative for adenopathy. Does not bruise/bleed easily.  Psychiatric/Behavioral: Negative for behavioral problems, confusion, sleep disturbance, dysphoric mood, decreased concentration and agitation.       Objective:   Physical Exam  Constitutional: He is oriented to person, place, and time. He appears well-developed and well-nourished. No distress.  HENT:  Head: Normocephalic and  atraumatic.  Mouth/Throat: Oropharynx is clear and moist. No oropharyngeal exudate.  Eyes: Conjunctivae and EOM are normal. Pupils are equal, round, and reactive to light. No scleral icterus.  Neck: Normal range of motion. Neck supple. No JVD present.  Cardiovascular: Normal rate, regular rhythm and normal heart sounds.  Exam reveals no gallop and no friction rub.   No murmur heard. Pulmonary/Chest: Effort normal and breath sounds normal. No respiratory distress. He has no wheezes. He has no rales. He exhibits no tenderness.  Abdominal: He exhibits no distension and no mass. There is no tenderness. There is no rebound and no guarding.  Musculoskeletal: He exhibits no edema or tenderness.  Lymphadenopathy:    He has no cervical adenopathy.  Neurological: He is alert and oriented to person, place, and time. He exhibits normal muscle tone. Coordination normal.  Skin: Skin is warm and dry. He is not diaphoretic. No erythema. No pallor.  Psychiatric: He has a normal mood and affect. His behavior is normal. Judgment and thought content normal.          Assessment & Plan:  HIV: continue Genvoya and Prezista  HTN: followed with Dr. Derrel Nip  Hyperlipidemia: at goal  Syphilis: titers appropriate b ut rechecking given testing for STDS  Urethritis: check urine GC an chlamydia. Dose with rocephin and azithromycin   Asplenia: still Needs redose of menactra  CKD with AKI in the past an episode where we took away TNF TDF. TNF-TAF will not have renal risk  We  spent greater than 25 minutes with the patient including greater than 50% of time in face to face counsel of the patient's HIV, his urethritis, STD screening and prevention, HTN nd in coordination of his care.

## 2015-02-12 LAB — URINE CYTOLOGY ANCILLARY ONLY
Chlamydia: NEGATIVE
Neisseria Gonorrhea: POSITIVE — AB

## 2015-02-12 LAB — RPR

## 2015-02-12 MED ORDER — AZITHROMYCIN 250 MG PO TABS
1000.0000 mg | ORAL_TABLET | Freq: Once | ORAL | Status: AC
  Administered 2015-02-11: 1000 mg via ORAL

## 2015-02-12 MED ORDER — CEFTRIAXONE SODIUM 1 G IJ SOLR
250.0000 mg | Freq: Once | INTRAMUSCULAR | Status: AC
  Administered 2015-02-11: 250 mg via INTRAMUSCULAR

## 2015-02-12 NOTE — Addendum Note (Signed)
Addended by: Reggy Eye on: 02/12/2015 12:14 PM   Modules accepted: Orders

## 2015-02-16 NOTE — Progress Notes (Signed)
Excellent

## 2015-03-01 ENCOUNTER — Other Ambulatory Visit: Payer: Self-pay | Admitting: Internal Medicine

## 2015-03-02 NOTE — Telephone Encounter (Signed)
Medication last refilled in November with two refills. Please advise?

## 2015-03-02 NOTE — Telephone Encounter (Signed)
Refill for 30 days only.  OFFICE VISIT NEEDED prior to any more refills 

## 2015-03-22 ENCOUNTER — Other Ambulatory Visit: Payer: Self-pay | Admitting: *Deleted

## 2015-03-22 MED ORDER — PRAVASTATIN SODIUM 40 MG PO TABS: 40.0000 mg | ORAL_TABLET | Freq: Every day | ORAL | Status: DC

## 2015-05-03 ENCOUNTER — Other Ambulatory Visit: Payer: Self-pay | Admitting: Infectious Disease

## 2015-05-25 ENCOUNTER — Other Ambulatory Visit: Payer: Self-pay | Admitting: Internal Medicine

## 2015-05-25 NOTE — Telephone Encounter (Signed)
Last OV 3/16 ok to fill? 

## 2015-05-26 NOTE — Telephone Encounter (Signed)
Refill for 30 days only.  OFFICE VISIT NEEDED prior to any more refills 

## 2015-06-21 ENCOUNTER — Other Ambulatory Visit: Payer: Self-pay | Admitting: Internal Medicine

## 2015-06-29 ENCOUNTER — Telehealth: Payer: Self-pay | Admitting: Internal Medicine

## 2015-06-29 MED ORDER — LISINOPRIL-HYDROCHLOROTHIAZIDE 20-25 MG PO TABS: 1.0000 | ORAL_TABLET | Freq: Every day | ORAL | Status: DC

## 2015-06-29 MED ORDER — MELOXICAM 15 MG PO TABS: ORAL_TABLET | ORAL | Status: DC

## 2015-06-29 NOTE — Telephone Encounter (Signed)
Refilled for 30 days only, must keep OV for more. thanks

## 2015-06-29 NOTE — Telephone Encounter (Signed)
Pt called about needing refills for his medication. Pt has to come in before he can get a refill. Pt is scheduled for 07/03.  Medication is lisinopril-hydrochlorothiazide (PRINZIDE,ZESTORETIC) 20-25 MG tablet and meloxicam (MOBIC) 15 MG tablet.  Pharmacy is [id number] - [place], [address] [place] [address]  Call pt @ [phone]. Thank you!

## 2015-07-07 ENCOUNTER — Other Ambulatory Visit: Payer: Self-pay | Admitting: Internal Medicine

## 2015-07-22 ENCOUNTER — Other Ambulatory Visit: Payer: Self-pay | Admitting: Infectious Disease

## 2015-07-26 ENCOUNTER — Ambulatory Visit (INDEPENDENT_AMBULATORY_CARE_PROVIDER_SITE_OTHER): Payer: Medicare Other | Admitting: Internal Medicine

## 2015-07-26 ENCOUNTER — Encounter: Payer: Self-pay | Admitting: Internal Medicine

## 2015-07-26 VITALS — BP 106/70 | HR 89 | Temp 98.3°F | Resp 12 | Ht 71.0 in | Wt 241.2 lb

## 2015-07-26 DIAGNOSIS — R7301 Impaired fasting glucose: Secondary | ICD-10-CM

## 2015-07-26 DIAGNOSIS — N182 Chronic kidney disease, stage 2 (mild): Secondary | ICD-10-CM | POA: Diagnosis not present

## 2015-07-26 DIAGNOSIS — E785 Hyperlipidemia, unspecified: Secondary | ICD-10-CM

## 2015-07-26 DIAGNOSIS — E559 Vitamin D deficiency, unspecified: Secondary | ICD-10-CM | POA: Diagnosis not present

## 2015-07-26 DIAGNOSIS — I10 Essential (primary) hypertension: Secondary | ICD-10-CM | POA: Diagnosis not present

## 2015-07-26 DIAGNOSIS — Z0000 Encounter for general adult medical examination without abnormal findings: Secondary | ICD-10-CM | POA: Diagnosis not present

## 2015-07-26 NOTE — Progress Notes (Signed)
Pre-visit discussion using our clinic review tool. No additional management support is needed unless otherwise documented below in the visit note.  

## 2015-07-26 NOTE — Patient Instructions (Addendum)
We will check non asting labs today.  will repeat if lipids are elevated prior to next visit   Health Maintenance, Male A healthy lifestyle and preventative care can promote health and wellness.  Maintain regular health, dental, and eye exams.  Eat a healthy diet. Foods like vegetables, fruits, whole grains, low-fat dairy products, and lean protein foods contain the nutrients you need and are low in calories. Decrease your intake of foods high in solid fats, added sugars, and salt. Get information about a proper diet from your health care provider, if necessary.  Regular physical exercise is one of the most important things you can do for your health. Most adults should get at least 150 minutes of moderate-intensity exercise (any activity that increases your heart rate and causes you to sweat) each week. In addition, most adults need muscle-strengthening exercises on 2 or more days a week.   Maintain a healthy weight. The body mass index (BMI) is a screening tool to identify possible weight problems. It provides an estimate of body fat based on height and weight. Your health care provider can find your BMI and can help you achieve or maintain a healthy weight. For males 20 years and older:  A BMI below 18.5 is considered underweight.  A BMI of 18.5 to 24.9 is normal.  A BMI of 25 to 29.9 is considered overweight.  A BMI of 30 and above is considered obese.  Maintain normal blood lipids and cholesterol by exercising and minimizing your intake of saturated fat. Eat a balanced diet with plenty of fruits and vegetables. Blood tests for lipids and cholesterol should begin at age 65 and be repeated every 5 years. If your lipid or cholesterol levels are high, you are over age 3, or you are at high risk for heart disease, you may need your cholesterol levels checked more frequently.Ongoing high lipid and cholesterol levels should be treated with medicines if diet and exercise are not working.  If  you smoke, find out from your health care provider how to quit. If you do not use tobacco, do not start.  Lung cancer screening is recommended for adults aged 50-80 years who are at high risk for developing lung cancer because of a history of smoking. A yearly low-dose CT scan of the lungs is recommended for people who have at least a 30-pack-year history of smoking and are current smokers or have quit within the past 15 years. A pack year of smoking is smoking an average of 1 pack of cigarettes a day for 1 year (for example, a 30-pack-year history of smoking could mean smoking 1 pack a day for 30 years or 2 packs a day for 15 years). Yearly screening should continue until the smoker has stopped smoking for at least 15 years. Yearly screening should be stopped for people who develop a health problem that would prevent them from having lung cancer treatment.  If you choose to drink alcohol, do not have more than 2 drinks per day. One drink is considered to be 12 oz (360 mL) of beer, 5 oz (150 mL) of wine, or 1.5 oz (45 mL) of liquor.  Avoid the use of street drugs. Do not share needles with anyone. Ask for help if you need support or instructions about stopping the use of drugs.  High blood pressure causes heart disease and increases the risk of stroke. High blood pressure is more likely to develop in:  People who have blood pressure in the end of  the normal range (100-139/85-89 mm Hg).  People who are overweight or obese.  People who are African American.  If you are 54-39 years of age, have your blood pressure checked every 3-5 years. If you are 54 years of age or older, have your blood pressure checked every year. You should have your blood pressure measured twice--once when you are at a hospital or clinic, and once when you are not at a hospital or clinic. Record the average of the two measurements. To check your blood pressure when you are not at a hospital or clinic, you can use:  An automated  blood pressure machine at a pharmacy.  A home blood pressure monitor.  If you are 45-99 years old, ask your health care provider if you should take aspirin to prevent heart disease.  Diabetes screening involves taking a blood sample to check your fasting blood sugar level. This should be done once every 3 years after age 40 if you are at a normal weight and without risk factors for diabetes. Testing should be considered at a younger age or be carried out more frequently if you are overweight and have at least 1 risk factor for diabetes.  Colorectal cancer can be detected and often prevented. Most routine colorectal cancer screening begins at the age of 47 and continues through age 1. However, your health care provider may recommend screening at an earlier age if you have risk factors for colon cancer. On a yearly basis, your health care provider may provide home test kits to check for hidden blood in the stool. A small camera at the end of a tube may be used to directly examine the colon (sigmoidoscopy or colonoscopy) to detect the earliest forms of colorectal cancer. Talk to your health care provider about this at age 18 when routine screening begins. A direct exam of the colon should be repeated every 5-10 years through age 61, unless early forms of precancerous polyps or small growths are found.  People who are at an increased risk for hepatitis B should be screened for this virus. You are considered at high risk for hepatitis B if:  You were born in a country where hepatitis B occurs often. Talk with your health care provider about which countries are considered high risk.  Your parents were born in a high-risk country and you have not received a shot to protect against hepatitis B (hepatitis B vaccine).  You have HIV or AIDS.  You use needles to inject street drugs.  You live with, or have sex with, someone who has hepatitis B.  You are a man who has sex with other men (MSM).  You get  hemodialysis treatment.  You take certain medicines for conditions like cancer, organ transplantation, and autoimmune conditions.  Hepatitis C blood testing is recommended for all people born from [birth year] through 1964 and any individual with known risk factors for hepatitis C.  Healthy men should no longer receive prostate-specific antigen (PSA) blood tests as part of routine cancer screening. Talk to your health care provider about prostate cancer screening.  Testicular cancer screening is not recommended for adolescents or adult males who have no symptoms. Screening includes self-exam, a health care provider exam, and other screening tests. Consult with your health care provider about any symptoms you have or any concerns you have about testicular cancer.  Practice safe sex. Use condoms and avoid high-risk sexual practices to reduce the spread of sexually transmitted infections (STIs).  You should be screened  for STIs, including gonorrhea and chlamydia if:  You are sexually active and are younger than 24 years.  You are older than 24 years, and your health care provider tells you that you are at risk for this type of infection.  Your sexual activity has changed since you were last screened, and you are at an increased risk for chlamydia or gonorrhea. Ask your health care provider if you are at risk.  If you are at risk of being infected with HIV, it is recommended that you take a prescription medicine daily to prevent HIV infection. This is called pre-exposure prophylaxis (PrEP). You are considered at risk if:  You are a man who has sex with other men (MSM).  You are a heterosexual man who is sexually active with multiple partners.  You take drugs by injection.  You are sexually active with a partner who has HIV.  Talk with your health care provider about whether you are at high risk of being infected with HIV. If you choose to begin PrEP, you should first be tested for HIV. You should  then be tested every 3 months for as long as you are taking PrEP.  Use sunscreen. Apply sunscreen liberally and repeatedly throughout the day. You should seek shade when your shadow is shorter than you. Protect yourself by wearing long sleeves, pants, a wide-brimmed hat, and sunglasses year round whenever you are outdoors.  Tell your health care provider of new moles or changes in moles, especially if there is a change in shape or color. Also, tell your health care provider if a mole is larger than the size of a pencil eraser.  A one-time screening for abdominal aortic aneurysm (AAA) and surgical repair of large AAAs by ultrasound is recommended for men aged 51-75 years who are current or former smokers.  Stay current with your vaccines (immunizations).   This information is not intended to replace advice given to you by your health care provider. Make sure you discuss any questions you have with your health care provider.   Document Released: 07/08/2007 Document Revised: 01/30/2014 Document Reviewed: 06/06/2010 Elsevier Interactive Patient Education [employer].

## 2015-07-27 DIAGNOSIS — R7301 Impaired fasting glucose: Secondary | ICD-10-CM

## 2015-07-27 HISTORY — DX: Impaired fasting glucose: R73.01

## 2015-07-27 LAB — COMPREHENSIVE METABOLIC PANEL
ALT: 37 U/L (ref 9–46)
AST: 31 U/L (ref 10–35)
Albumin: 4.3 g/dL (ref 3.6–5.1)
Alkaline Phosphatase: 48 U/L (ref 40–115)
BUN: 25 mg/dL (ref 7–25)
CO2: 24 mmol/L (ref 20–31)
Calcium: 9.4 mg/dL (ref 8.6–10.3)
Chloride: 105 mmol/L (ref 98–110)
Creat: 1.88 mg/dL — ABNORMAL HIGH (ref 0.70–1.33)
Glucose, Bld: 96 mg/dL (ref 65–99)
Potassium: 4.2 mmol/L (ref 3.5–5.3)
Sodium: 139 mmol/L (ref 135–146)
Total Bilirubin: 0.4 mg/dL (ref 0.2–1.2)
Total Protein: 8.1 g/dL (ref 6.1–8.1)

## 2015-07-27 LAB — LIPID PANEL
Cholesterol: 167 mg/dL (ref 125–200)
HDL: 39 mg/dL — ABNORMAL LOW (ref >=40)
LDL Cholesterol: 98 mg/dL (ref <130)
Total CHOL/HDL Ratio: 4.3 Ratio (ref <=5.0)
Triglycerides: 152 mg/dL — ABNORMAL HIGH (ref <150)
VLDL: 30 mg/dL (ref <30)

## 2015-07-27 LAB — TSH: TSH: 0.71 mIU/L (ref 0.40–4.50)

## 2015-07-27 LAB — HEMOGLOBIN A1C
Hgb A1c MFr Bld: 5.7 % — ABNORMAL HIGH (ref <5.7)
Mean Plasma Glucose: 117 mg/dL

## 2015-07-27 LAB — LDL CHOLESTEROL, DIRECT: Direct LDL: 99 mg/dL (ref <130)

## 2015-07-27 MED ORDER — FEBUXOSTAT 40 MG PO TABS: 40.0000 mg | ORAL_TABLET | Freq: Every day | ORAL | Status: DC

## 2015-07-27 NOTE — Assessment & Plan Note (Signed)
Well controlled on current regimen. Renal function has worsened.  no changes today.  Lab Results  Component Value Date   CREATININE 1.88* 07/26/2015   Lab Results  Component Value Date   NA 139 07/26/2015   K 4.2 07/26/2015   CL 105 07/26/2015   CO2 24 07/26/2015

## 2015-07-27 NOTE — Assessment & Plan Note (Signed)
A1c is close to prediabetes. ecommended a low glycemic index diet utilizing smaller more frequent meals to increase metabolism.  I have also recommended that patient start exercising with a goal of 30 minutes of aerobic exercise a minimum of 5 days per week.   Lab Results  Component Value Date   HGBA1C 5.7* 07/26/2015

## 2015-07-27 NOTE — Assessment & Plan Note (Signed)
Annual comprehensive preventive exam was done as well as an evaluation and management of chronic conditions .  During the course of the visit the patient was educated and counseled about appropriate screening and preventive services including :  diabetes screening, lipid analysis with projected  10 year  risk for CAD  Which is 4.7 % using the Framingham risk calculator for men, , nutrition counseling, colorectal cancer screening, and recommended immunizations.  Printed recommendations for health maintenance screenings was given.

## 2015-07-27 NOTE — Assessment & Plan Note (Addendum)
Renal function has nearly worsened again .  He has been taking meloxicam and allopurinol . Will suspend meloxicam and substitute Uloric for allopurinol and repeat renal function in 2 weeks.  Lab Results  Component Value Date   CREATININE 1.88* 07/26/2015   CREATININE 1.33 12/10/2014   CREATININE 1.43 11/19/2014     Lab Results  Component Value Date   CREATININE 1.88* 07/26/2015     Lab Results  Component Value Date   NA 139 07/26/2015   K 4.2 07/26/2015   CL 105 07/26/2015   CO2 24 07/26/2015

## 2015-07-27 NOTE — Progress Notes (Signed)
Patient ID: Adam Becker, male    DOB: September 14, 1955  Age: 60 y.o. MRN: [id number]  The patient is here for annual wellness examination and management of other chronic and acute problems.   The risk factors are reflected in the social history.  The roster of all physicians providing medical care to patient - is listed in the Snapshot section of the chart. Home safety : The patient has smoke detectors in the home. They wear seatbelts.  There are no firearms at home. There is no violence in the home.   There is no risks for hepatitis, STDs or HIV. There is no   history of blood transfusion. They have no travel history to infectious disease endemic areas of the world.  The patient has seen their dentist in the last six month. They have seen their eye doctor in the last year. T\They do not  have excessive sun exposure. Discussed the need for sun protection: hats, long sleeves and use of sunscreen if there is significant sun exposure.   Diet: the importance of a healthy diet is discussed. They do have a healthy diet.  The benefits of regular aerobic exercise were discussed. She walks 4 times per week ,  20 minutes.   Depression screen: there are no signs or vegative symptoms of depression- irritability, change in appetite, anhedonia, sadness/tearfullness.  The following portions of the patient's history were reviewed and updated as appropriate: allergies, current medications, past family history, past medical history,  past surgical history, past social history  and problem list.  Visual acuity was not assessed per patient preference since she has regular follow up with her ophthalmologist. Hearing and body mass index were assessed and reviewed.   During the course of the visit the patient was educated and counseled about appropriate screening and preventive services including : fall prevention , diabetes screening, nutrition counseling, colorectal cancer screening, and recommended immunizations.     CC: The primary encounter diagnosis was Impaired fasting glucose. Diagnoses of Essential hypertension, Vitamin D deficiency, Hyperlipidemia, Visit for preventive health examination, Chronic kidney disease (CKD), stage II (mild), and HYPERTENSION, BENIGN ESSENTIAL were also pertinent to this visit.  ollow up on hypertension,  Hyperlipidemia, and CKD.  He was last seen in March 2016. He has been following up with ID quarterly for management of HIV and latent syphilis.  His Labs normal in Nov 2016.  Hypertension: patient checks blood pressure twice weekly at home.  Readings have been for the most part < 140/80 at rest . Patient is following a reduced salt diet most days and is taking medications as prescribed.  He is tolerating lisinopril   He had a screening Colonoscopy in 2015.  He is due for follow up in 5 years ,due to a family history of colon CA   Occasional insomnia . Marland KitchenWakes up feeling rested. Using tylenol prn headache,  Avoiding NSAIDs due to CKD.  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   He has past surgical history that includes Splenectomy (2000) and Colonoscopy.   His family history includes Birth defects in his son; Breast cancer in his mother; Cancer in his mother; Colon cancer in his father.He reports that he has never smoked. He has never used smokeless tobacco. He reports that he does not  drink alcohol or use illicit drugs.  Outpatient Prescriptions Prior to Visit  Medication Sig Dispense Refill  . allopurinol (ZYLOPRIM) 100 MG tablet TAKE 2 TABLETS (200 MG TOTAL) BY MOUTH DAILY. 180 tablet 1  . allopurinol (ZYLOPRIM) 100 MG tablet TAKE 2 TABLETS (200 MG TOTAL) BY MOUTH DAILY. 180 tablet 1  . elvitegravir-cobicistat-emtricitabine-tenofovir  (GENVOYA) 150-150-200-10 MG TABS tablet Take 1 tablet by mouth daily with breakfast. 30 tablet 11  . lisinopril-hydrochlorothiazide (PRINZIDE,ZESTORETIC) 20-25 MG tablet Take 1 tablet by mouth daily. 30 tablet 0  . pravastatin (PRAVACHOL) 40 MG tablet TAKE 1 TABLET BY MOUTH EVERY DAY 90 tablet 0  . PREZISTA 800 MG tablet TAKE 1 TABLET BY MOUTH DAILY 30 tablet 5  . meloxicam (MOBIC) 15 MG tablet TAKE 1 TABLET (15 MG TOTAL) BY MOUTH DAILY. 30 tablet 0   No facility-administered medications prior to visit.    Review of Systems   Patient denies headache, fevers, malaise, unintentional weight loss, skin rash, eye pain, sinus congestion and sinus pain, sore throat, dysphagia,  hemoptysis , cough, dyspnea, wheezing, chest pain, palpitations, orthopnea, edema, abdominal pain, nausea, melena, diarrhea, constipation, flank pain, dysuria, hematuria, urinary  Frequency, nocturia, numbness, tingling, seizures,  Focal weakness, Loss of consciousness,  Tremor, insomnia, depression, anxiety, and suicidal ideation.      Objective:  BP 106/70 mmHg  Pulse 89  Temp(Src) 98.3 F (36.8 C) (Oral)  Resp 12  Ht '5\' 11"'  (1.803 m)  Wt 241 lb 4 oz (109.43 kg)  BMI 33.66 kg/m2  SpO2 96%  Physical Exam   General appearance: alert, cooperative and appears stated age Ears: normal TM's and external ear canals both ears Throat: lips, mucosa, and tongue normal; teeth and gums normal Neck: no adenopathy, no carotid bruit, supple, symmetrical, trachea midline and thyroid not enlarged, symmetric, no tenderness/mass/nodules Back: symmetric, no curvature. ROM normal. No CVA tenderness. Lungs: clear to auscultation bilaterally Heart: regular rate and rhythm, S1, S2 normal, no murmur, click, rub or gallop Abdomen: soft, non-tender; bowel sounds normal; no masses,  no organomegaly Pulses: 2+ and symmetric Skin: Skin color, texture, turgor normal. No rashes or lesions Lymph nodes: Cervical, supraclavicular, and axillary  nodes normal.   Assessment & Plan:   Problem List Items Addressed This Visit    HYPERTENSION, BENIGN ESSENTIAL    Well controlled on current regimen. Renal function has worsened.  no changes today.  Lab Results  Component Value Date   CREATININE 1.88* 07/26/2015   Lab Results  Component Value Date   NA 139 07/26/2015   K 4.2 07/26/2015   CL 105 07/26/2015   CO2 24 07/26/2015         Chronic kidney disease (CKD), stage II (mild)    Renal function has nearly worsened again .  He has been taking meloxicam and allopurinol . Will suspend meloxicam and substitute Uloric for allopurinol and repeat renal function in 2 weeks.  Lab Results  Component Value Date   CREATININE 1.88* 07/26/2015   CREATININE 1.33 12/10/2014   CREATININE 1.43 11/19/2014     Lab Results  Component Value Date   CREATININE 1.88* 07/26/2015     Lab Results  Component Value Date   NA 139 07/26/2015   K 4.2 07/26/2015   CL 105 07/26/2015   CO2 24 07/26/2015         Visit for preventive health examination    Annual comprehensive preventive exam was done as well as an evaluation and management of chronic conditions .  During the course of the visit the patient was educated and counseled about appropriate screening and preventive services including :  diabetes screening, lipid analysis with projected  10 year  risk for CAD  Which is 4.7 % using the Framingham risk calculator for men, , nutrition counseling, colorectal cancer screening, and recommended immunizations.  Printed recommendations for health maintenance screenings was given.        Impaired fasting glucose - Primary    A1c is close to prediabetes. ecommended a low glycemic index diet utilizing smaller more frequent meals to increase metabolism.  I have also recommended that patient start exercising with a goal of 30 minutes of aerobic exercise a minimum of 5 days per week.   Lab Results  Component Value Date   HGBA1C 5.7* 07/26/2015          Relevant Orders   Hemoglobin A1c (Completed)   TSH (Completed)   Lipid panel (Completed)   Direct LDL (Completed)   Comp Met (CMET) (Completed)   Hyperlipidemia   Relevant Orders   Hemoglobin A1c (Completed)   TSH (Completed)   Lipid panel (Completed)   Direct LDL (Completed)   Comp Met (CMET) (Completed)    Other Visit Diagnoses    Essential hypertension        Relevant Orders    Hemoglobin A1c (Completed)    TSH (Completed)    Lipid panel (Completed)    Direct LDL (Completed)    Comp Met (CMET) (Completed)    Vitamin D deficiency        Relevant Orders    Hemoglobin A1c (Completed)    TSH (Completed)    Lipid panel (Completed)    Direct LDL (Completed)    Comp Met (CMET) (Completed)       I have discontinued Mr. Maselli meloxicam. I am also having him start on febuxostat. Additionally, I am having him maintain his allopurinol, elvitegravir-cobicistat-emtricitabine-tenofovir, PREZISTA, lisinopril-hydrochlorothiazide, allopurinol, and pravastatin.  Meds ordered this encounter  Medications  . febuxostat (ULORIC) 40 MG tablet    Sig: Take 1 tablet (40 mg total) by mouth daily.    Dispense:  30 tablet    Refill:  2    Medications Discontinued During This Encounter  Medication Reason  . meloxicam (MOBIC) 15 MG tablet     Follow-up: No Follow-up on file.   [name], MD

## 2015-07-29 ENCOUNTER — Other Ambulatory Visit: Payer: Self-pay | Admitting: Internal Medicine

## 2015-07-30 NOTE — Telephone Encounter (Signed)
Ok to refill Mobic in labs you had advised patient to stop all Nsaid's  Will Mobic affect kidney function?

## 2015-07-30 NOTE — Telephone Encounter (Signed)
Patient could not remember if he was using meloxicam for anything. I asked if he was ok with me declining and he stated that it was okay.

## 2015-08-11 ENCOUNTER — Telehealth: Payer: Self-pay

## 2015-08-11 DIAGNOSIS — N179 Acute kidney failure, unspecified: Secondary | ICD-10-CM

## 2015-08-11 NOTE — Telephone Encounter (Signed)
bmet ordered 

## 2015-08-11 NOTE — Telephone Encounter (Signed)
Pt coming for labs 08/12/15. I see standing orders from another office. Need future orders placed from our office. Thank you.

## 2015-08-11 NOTE — Telephone Encounter (Signed)
Noted! Thank you

## 2015-08-12 ENCOUNTER — Other Ambulatory Visit (INDEPENDENT_AMBULATORY_CARE_PROVIDER_SITE_OTHER): Payer: Medicare Other

## 2015-08-12 DIAGNOSIS — N179 Acute kidney failure, unspecified: Secondary | ICD-10-CM | POA: Diagnosis not present

## 2015-08-12 LAB — BASIC METABOLIC PANEL
BUN: 29 mg/dL — ABNORMAL HIGH (ref 6–23)
CO2: 25 mEq/L (ref 19–32)
Calcium: 9.7 mg/dL (ref 8.4–10.5)
Chloride: 105 mEq/L (ref 96–112)
Creatinine, Ser: 1.87 mg/dL — ABNORMAL HIGH (ref 0.40–1.50)
GFR: 47.79 mL/min — ABNORMAL LOW (ref >60.00)
Glucose, Bld: 111 mg/dL — ABNORMAL HIGH (ref 70–99)
Potassium: 4.2 mEq/L (ref 3.5–5.1)
Sodium: 138 mEq/L (ref 135–145)

## 2015-08-13 ENCOUNTER — Other Ambulatory Visit: Payer: Self-pay

## 2015-08-13 MED ORDER — AMLODIPINE BESYLATE 5 MG PO TABS: 5.0000 mg | ORAL_TABLET | Freq: Every day | ORAL | Status: DC

## 2015-08-13 NOTE — Telephone Encounter (Signed)
Please advise refills, last fill he needed appt, he had on 7/3. thanks

## 2015-08-13 NOTE — Telephone Encounter (Signed)
He should NOT be taking meloxicam because I stopped it last week due to decline in kidney function . Find out if he followed directions because his kdiney funciton is no better on repeat .  See lab results note

## 2015-08-13 NOTE — Addendum Note (Signed)
Addended by: [name] on: 08/13/2015 01:05 PM   Modules accepted: Orders

## 2015-08-16 ENCOUNTER — Other Ambulatory Visit: Payer: Self-pay

## 2015-08-16 ENCOUNTER — Telehealth: Payer: Self-pay | Admitting: *Deleted

## 2015-08-16 MED ORDER — PREDNISONE 10 MG PO TABS: ORAL_TABLET | ORAL | 0 refills | Status: DC

## 2015-08-16 MED ORDER — TRAMADOL HCL 50 MG PO TABS
50.0000 mg | ORAL_TABLET | Freq: Three times a day (TID) | ORAL | 0 refills | Status: DC | PRN
Start: 2015-08-16 — End: 2015-10-12

## 2015-08-16 NOTE — Telephone Encounter (Signed)
The answer is still NO.  Somebody please contact the patient,  Since Adam Becker never responded to last refusal.

## 2015-08-16 NOTE — Telephone Encounter (Signed)
Spoke with patient, verbalized understanding and is not taking Meloxicam now.

## 2015-08-16 NOTE — Telephone Encounter (Signed)
Pt requesting refill of Meloxicam. Please advise on Meloxicam refill. I read in latest OV for pt to suspend Meloxicam.

## 2015-08-16 NOTE — Telephone Encounter (Signed)
I spoke with patient, sorry I was in a meeting.

## 2015-08-16 NOTE — Telephone Encounter (Signed)
Spoke with the patient, reviewed orders and he will call if no relief, thanks

## 2015-08-16 NOTE — Telephone Encounter (Signed)
Patient has requested a call to discuss his medication change for his gout. He stated that he's having a flare up Pt contact [phone]

## 2015-08-16 NOTE — Telephone Encounter (Signed)
Spoke with the patient, Gout flare up, he has pain in his toes, he is taking the Uloric which was changed from his allopurinol due to his kidney function test.  He is taking the Uloric once a day and the gout has gotten worse in the past two days, what can he do as the medication doesn't seem to be helping. thanks

## 2015-08-16 NOTE — Telephone Encounter (Signed)
I sent a prednisone taper to his pharmacy to take instead of the meloxicam.  Will not affect kidney function.  I will also send rx for tramadol for pain

## 2015-08-31 ENCOUNTER — Other Ambulatory Visit (INDEPENDENT_AMBULATORY_CARE_PROVIDER_SITE_OTHER): Payer: Medicare Other

## 2015-08-31 DIAGNOSIS — N179 Acute kidney failure, unspecified: Secondary | ICD-10-CM

## 2015-08-31 LAB — BASIC METABOLIC PANEL
BUN: 15 mg/dL (ref 6–23)
CO2: 27 mEq/L (ref 19–32)
Calcium: 9.7 mg/dL (ref 8.4–10.5)
Chloride: 106 mEq/L (ref 96–112)
Creatinine, Ser: 1.25 mg/dL (ref 0.40–1.50)
GFR: 76.05 mL/min (ref >60.00)
Glucose, Bld: 105 mg/dL — ABNORMAL HIGH (ref 70–99)
Potassium: 4.3 mEq/L (ref 3.5–5.1)
Sodium: 140 mEq/L (ref 135–145)

## 2015-09-07 ENCOUNTER — Ambulatory Visit: Payer: Medicare Other

## 2015-09-07 VITALS — BP 138/82 | HR 68

## 2015-09-07 DIAGNOSIS — I10 Essential (primary) hypertension: Secondary | ICD-10-CM

## 2015-09-07 NOTE — Progress Notes (Signed)
Chart has been reviewed. Blood pressure today reviewed. Is less than the recommended goal of 140/90. We'll defer changes to patient's PCP and forward note to her.  Tommi Rumps, M.D.

## 2015-09-07 NOTE — Progress Notes (Signed)
Patient was in today receiving a BP check which was requested by dr.[name]. Patient has stopped HCTZ medication. Message routed to Dr.Sonnenberg due to Dr.[name] out of the office.

## 2015-09-08 ENCOUNTER — Other Ambulatory Visit: Payer: Self-pay | Admitting: Internal Medicine

## 2015-09-08 NOTE — Telephone Encounter (Signed)
Pt was advised at visit on 07/26/15 to not take meloxicam due to renal function. Please advise on refill.

## 2015-09-09 ENCOUNTER — Other Ambulatory Visit: Payer: Self-pay | Admitting: Internal Medicine

## 2015-09-23 ENCOUNTER — Telehealth: Payer: Self-pay | Admitting: *Deleted

## 2015-09-23 DIAGNOSIS — N182 Chronic kidney disease, stage 2 (mild): Secondary | ICD-10-CM

## 2015-09-23 MED ORDER — LISINOPRIL 20 MG PO TABS: 20.0000 mg | ORAL_TABLET | Freq: Every day | ORAL | 3 refills | Status: DC

## 2015-09-23 NOTE — Telephone Encounter (Signed)
He can resume lisinopril ONLY,  NOT THE HCTZ.  I have sent the rx for lisinopril 20 mg to pharmacy.  He will need an RN visit for Repeat BMET and BP check one week .

## 2015-09-23 NOTE — Telephone Encounter (Signed)
Patient stated that he was taken off his blood pressure medication . He took his blood pressure two days ago with a reading of 138/92 and this morning with a reading of 129/90. He questioned if he should continue medication. He would like Dr Derrel Nip advise  Pt contact [phone]

## 2015-09-23 NOTE — Telephone Encounter (Signed)
Please advise, see last BP check visit note. thanks

## 2015-09-23 NOTE — Telephone Encounter (Signed)
Spoke with patient and scheduled for labs and Nurse visit next Thursday, thanks

## 2015-09-28 ENCOUNTER — Other Ambulatory Visit: Payer: Medicare Other

## 2015-09-28 DIAGNOSIS — B20 Human immunodeficiency virus [HIV] disease: Secondary | ICD-10-CM

## 2015-09-28 DIAGNOSIS — N182 Chronic kidney disease, stage 2 (mild): Secondary | ICD-10-CM | POA: Diagnosis not present

## 2015-09-28 DIAGNOSIS — A528 Late syphilis, latent: Secondary | ICD-10-CM | POA: Diagnosis not present

## 2015-09-28 DIAGNOSIS — I10 Essential (primary) hypertension: Secondary | ICD-10-CM | POA: Diagnosis not present

## 2015-09-28 DIAGNOSIS — A523 Neurosyphilis, unspecified: Secondary | ICD-10-CM | POA: Diagnosis not present

## 2015-09-28 DIAGNOSIS — I129 Hypertensive chronic kidney disease with stage 1 through stage 4 chronic kidney disease, or unspecified chronic kidney disease: Secondary | ICD-10-CM

## 2015-09-28 DIAGNOSIS — N342 Other urethritis: Secondary | ICD-10-CM | POA: Diagnosis not present

## 2015-09-28 DIAGNOSIS — E785 Hyperlipidemia, unspecified: Secondary | ICD-10-CM | POA: Diagnosis not present

## 2015-09-28 LAB — CBC WITH DIFFERENTIAL/PLATELET
Basophils Absolute: 60 cells/uL (ref 0–200)
Basophils Relative: 1 %
Eosinophils Absolute: 240 cells/uL (ref 15–500)
Eosinophils Relative: 4 %
HCT: 41.3 % (ref 38.5–50.0)
Hemoglobin: 13.8 g/dL (ref 13.2–17.1)
Lymphocytes Relative: 51 %
Lymphs Abs: 3060 cells/uL (ref 850–3900)
MCH: 32.9 pg (ref 27.0–33.0)
MCHC: 33.4 g/dL (ref 32.0–36.0)
MCV: 98.3 fL (ref 80.0–100.0)
MPV: 9.1 fL (ref 7.5–12.5)
Monocytes Absolute: 780 cells/uL (ref 200–950)
Monocytes Relative: 13 %
Neutro Abs: 1860 cells/uL (ref 1500–7800)
Neutrophils Relative %: 31 %
Platelets: 354 10*3/uL (ref 140–400)
RBC: 4.2 MIL/uL (ref 4.20–5.80)
RDW: 14.1 % (ref 11.0–15.0)
WBC: 6 10*3/uL (ref 3.8–10.8)

## 2015-09-28 LAB — LIPID PANEL
Cholesterol: 147 mg/dL (ref 125–200)
HDL: 38 mg/dL — ABNORMAL LOW (ref >=40)
LDL Cholesterol: 83 mg/dL (ref <130)
Total CHOL/HDL Ratio: 3.9 Ratio (ref <=5.0)
Triglycerides: 128 mg/dL (ref <150)
VLDL: 26 mg/dL (ref <30)

## 2015-09-28 LAB — COMPLETE METABOLIC PANEL WITH GFR
ALT: 41 U/L (ref 9–46)
AST: 50 U/L — ABNORMAL HIGH (ref 10–35)
Albumin: 4.3 g/dL (ref 3.6–5.1)
Alkaline Phosphatase: 52 U/L (ref 40–115)
BUN: 18 mg/dL (ref 7–25)
CO2: 28 mmol/L (ref 20–31)
Calcium: 8.9 mg/dL (ref 8.6–10.3)
Chloride: 105 mmol/L (ref 98–110)
Creat: 1.39 mg/dL — ABNORMAL HIGH (ref 0.70–1.33)
GFR, Est African American: 64 mL/min (ref >=60)
GFR, Est Non African American: 55 mL/min — ABNORMAL LOW (ref >=60)
Glucose, Bld: 128 mg/dL — ABNORMAL HIGH (ref 65–99)
Potassium: 4.4 mmol/L (ref 3.5–5.3)
Sodium: 139 mmol/L (ref 135–146)
Total Bilirubin: 0.4 mg/dL (ref 0.2–1.2)
Total Protein: 7.5 g/dL (ref 6.1–8.1)

## 2015-09-29 LAB — FLUORESCENT TREPONEMAL AB(FTA)-IGG-BLD: Fluorescent Treponemal ABS: REACTIVE — AB

## 2015-09-29 LAB — RPR TITER: RPR Titer: 1:2 {titer}

## 2015-09-29 LAB — T-HELPER CELL (CD4) - (RCID CLINIC ONLY)
CD4 % Helper T Cell: 31 % — ABNORMAL LOW (ref 33–55)
CD4 T Cell Abs: 1000 /uL (ref 400–2700)

## 2015-09-29 LAB — RPR: RPR Ser Ql: REACTIVE — AB

## 2015-09-30 ENCOUNTER — Other Ambulatory Visit (INDEPENDENT_AMBULATORY_CARE_PROVIDER_SITE_OTHER): Payer: Medicare Other

## 2015-09-30 ENCOUNTER — Ambulatory Visit (INDEPENDENT_AMBULATORY_CARE_PROVIDER_SITE_OTHER): Payer: Medicare Other

## 2015-09-30 VITALS — BP 120/80 | HR 75 | Resp 18

## 2015-09-30 DIAGNOSIS — N182 Chronic kidney disease, stage 2 (mild): Secondary | ICD-10-CM

## 2015-09-30 DIAGNOSIS — I10 Essential (primary) hypertension: Secondary | ICD-10-CM | POA: Diagnosis not present

## 2015-09-30 LAB — BASIC METABOLIC PANEL
BUN: 17 mg/dL (ref 6–23)
CO2: 29 mEq/L (ref 19–32)
Calcium: 9.4 mg/dL (ref 8.4–10.5)
Chloride: 104 mEq/L (ref 96–112)
Creatinine, Ser: 1.38 mg/dL (ref 0.40–1.50)
GFR: 67.83 mL/min (ref >60.00)
Glucose, Bld: 108 mg/dL — ABNORMAL HIGH (ref 70–99)
Potassium: 4.2 mEq/L (ref 3.5–5.1)
Sodium: 137 mEq/L (ref 135–145)

## 2015-09-30 LAB — HIV-1 RNA QUANT-NO REFLEX-BLD
HIV 1 RNA Quant: 69 copies/mL — ABNORMAL HIGH (ref <20)
HIV-1 RNA Quant, Log: 1.84 Log copies/mL — ABNORMAL HIGH (ref <1.30)

## 2015-09-30 NOTE — Progress Notes (Addendum)
Patient came in for a BP check.  Patient one week ago (09/23/15: Telephone note) was told to restart the Lisinopril only.  He has been checking his [facility] at [facility] at [facility].  Only the diastolic was higher prior to restarting the medications.  Checked BP in Bilateral upper extremities.  See vitals section for details.    Please advise.   I have reviewed the above information and agree with above. BP is at goal.  Continue lisinopril.  Deborra Medina, MD

## 2015-10-12 ENCOUNTER — Ambulatory Visit (INDEPENDENT_AMBULATORY_CARE_PROVIDER_SITE_OTHER): Payer: Medicare Other | Admitting: Infectious Disease

## 2015-10-12 ENCOUNTER — Other Ambulatory Visit (HOSPITAL_COMMUNITY)
Admission: RE | Admit: 2015-10-12 | Discharge: 2015-10-12 | Disposition: A | Discharge status: Home / Self Care | Payer: Medicare Other | Source: Ambulatory Visit | Attending: Infectious Disease | Admitting: Infectious Disease

## 2015-10-12 ENCOUNTER — Encounter: Payer: Self-pay | Admitting: Infectious Disease

## 2015-10-12 VITALS — Ht 71.0 in | Wt 233.0 lb

## 2015-10-12 DIAGNOSIS — N182 Chronic kidney disease, stage 2 (mild): Secondary | ICD-10-CM | POA: Diagnosis not present

## 2015-10-12 DIAGNOSIS — Z23 Encounter for immunization: Secondary | ICD-10-CM | POA: Diagnosis not present

## 2015-10-12 DIAGNOSIS — Z113 Encounter for screening for infections with a predominantly sexual mode of transmission: Secondary | ICD-10-CM | POA: Diagnosis not present

## 2015-10-12 DIAGNOSIS — B20 Human immunodeficiency virus [HIV] disease: Secondary | ICD-10-CM

## 2015-10-12 DIAGNOSIS — Q8901 Asplenia (congenital): Secondary | ICD-10-CM | POA: Diagnosis not present

## 2015-10-12 DIAGNOSIS — I10 Essential (primary) hypertension: Secondary | ICD-10-CM | POA: Diagnosis not present

## 2015-10-12 DIAGNOSIS — E785 Hyperlipidemia, unspecified: Secondary | ICD-10-CM | POA: Diagnosis not present

## 2015-10-12 NOTE — Progress Notes (Signed)
Subjective:  Chief complaint: followup for HIV disease   Patient ID: Adam Becker, male    DOB: 08/16/1955, 60 y.o.   MRN: [id number]  HPI  60 year old man with HIV currently well controlled on Genvoya with Prezista.   Lab Results  Component Value Date   HIV1RNAQUANT 69 (H) 09/28/2015   Lab Results  Component Value Date   CD4TABS 1,000 09/28/2015   CD4TABS 1,290 12/10/2014   CD4TABS 930 10/29/2014    He is doing well today and gout is well controlled and BP better controlled recently.   Past Medical History:  Diagnosis Date  . 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 Chronic kidney disease Nov 2013   admission for [facility] cr 11, acidosis  . HIV infection ([place])   . Hyperlipidemia   . Hypertension   . Urethritis 02/11/2015    Past Surgical History:  Procedure Laterality Date  . COLONOSCOPY    . SPLENECTOMY  2000   reason unclear "stopped working"    Family History  Problem Relation Age of Onset  . Cancer Mother   . Breast cancer Mother   . Colon cancer Father   . Birth defects Son     Breast       Social History   Social History  . Marital status: Single    Spouse name: N/A  . Number of children: N/A  . Years of education: N/A   Social History Main Topics  . Smoking status: Never Smoker  . Smokeless tobacco: Never Used  . Alcohol use No  . Drug use: No  . Sexual activity: Not Currently     Comment: declined condoms   Other Topics Concern  . None   Social History Narrative  . None    No Known Allergies   Current Outpatient Prescriptions:  .  amLODipine (NORVASC) 5 MG tablet, TAKE 1 TABLET (5 MG TOTAL) BY MOUTH DAILY., Disp: 30 tablet, Rfl: 3 .  elvitegravir-cobicistat-emtricitabine-tenofovir (GENVOYA) 150-150-200-10 MG TABS tablet, Take 1 tablet by mouth daily with breakfast., Disp: 30 tablet, Rfl: 11 .  febuxostat (ULORIC) 40 MG tablet, Take 1 tablet (40 mg total) by mouth daily., Disp: 30 tablet, Rfl: 2 .  lisinopril (PRINIVIL,ZESTRIL) 20 MG tablet, Take  1 tablet (20 mg total) by mouth daily., Disp: 90 tablet, Rfl: 3 .  pravastatin (PRAVACHOL) 40 MG tablet, TAKE 1 TABLET BY MOUTH EVERY DAY, Disp: 90 tablet, Rfl: 0 .  PREZISTA 800 MG tablet, TAKE 1 TABLET BY MOUTH DAILY, Disp: 30 tablet, Rfl: 5   Review of Systems  Constitutional: Negative for activity change, appetite change, chills, diaphoresis, fatigue, fever and unexpected weight change.  HENT: Negative for congestion, rhinorrhea, sinus pressure, sneezing, sore throat and trouble swallowing.   Eyes: Negative for photophobia and visual disturbance.  Respiratory: Negative for cough, chest tightness, shortness of breath, wheezing and stridor.   Cardiovascular: Negative for chest pain, palpitations and leg swelling.  Gastrointestinal: Negative for abdominal distention, abdominal pain, anal bleeding, blood in stool, constipation, diarrhea, nausea and vomiting.  Genitourinary: Negative for difficulty urinating, dysuria, flank pain and hematuria.  Musculoskeletal: Negative for arthralgias, back pain, gait problem, joint swelling and myalgias.  Skin: Negative for color change, pallor, rash and wound.  Neurological: Negative for dizziness, tremors, weakness and light-headedness.  Hematological: Negative for adenopathy. Does not bruise/bleed easily.  Psychiatric/Behavioral: Negative for agitation, behavioral problems, confusion, decreased concentration, dysphoric mood and sleep disturbance.       Objective:   Physical Exam  Constitutional: He is  oriented to person, place, and time. He appears well-developed and well-nourished. No distress.  HENT:  Head: Normocephalic and atraumatic.  Mouth/Throat: Oropharynx is clear and moist. No oropharyngeal exudate.  Eyes: Conjunctivae and EOM are normal. Pupils are equal, round, and reactive to light. No scleral icterus.  Neck: Normal range of motion. Neck supple. No JVD present.  Cardiovascular: Normal rate, regular rhythm and normal heart sounds.  Exam  reveals no gallop and no friction rub.   No murmur heard. Pulmonary/Chest: Effort normal and breath sounds normal. No respiratory distress. He has no wheezes. He has no rales. He exhibits no tenderness.  Abdominal: He exhibits no distension and no mass. There is no tenderness. There is no rebound and no guarding.  Musculoskeletal: He exhibits no edema or tenderness.  Lymphadenopathy:    He has no cervical adenopathy.  Neurological: He is alert and oriented to person, place, and time. He exhibits normal muscle tone. Coordination normal.  Skin: Skin is warm and dry. He is not diaphoretic. No erythema. No pallor.  Psychiatric: He has a normal mood and affect. His behavior is normal. Judgment and thought content normal.          Assessment & Plan:  HIV: continue Genvoya and Prezista, RTC in 6 months and then yearly  HTN: followed with Dr. Derrel Nip  There were no vitals filed for this visit.   Hyperlipidemia: at goal  Lipid Panel     Component Value Date/Time   CHOL 147 09/28/2015 1134   TRIG 128 09/28/2015 1134   HDL 38 (L) 09/28/2015 1134   CHOLHDL 3.9 09/28/2015 1134   VLDL 26 09/28/2015 1134   LDLCALC 83 09/28/2015 1134   LDLDIRECT 99 07/26/2015 1700     Syphilis: titers appropriate b ut rechecking given testing for STDS  STI: screen GU today  Asplenia:  Giving dose of menactra today a d flu vaccine  CKD with AKI in the past an episode: stablized  We  spent greater than 25 minutes with the patient including greater than 50% of time in face to face counsel of the patient's HIV, his , STD screening and prevention, HTN acute on chronic kidney disease, asplenia  nd in coordination of his care.

## 2015-10-14 ENCOUNTER — Other Ambulatory Visit: Payer: Self-pay | Admitting: Infectious Disease

## 2015-10-14 DIAGNOSIS — B20 Human immunodeficiency virus [HIV] disease: Secondary | ICD-10-CM

## 2015-10-14 LAB — URINE CYTOLOGY ANCILLARY ONLY
Chlamydia: NEGATIVE
Neisseria Gonorrhea: NEGATIVE

## 2015-10-18 ENCOUNTER — Other Ambulatory Visit: Payer: Self-pay | Admitting: Internal Medicine

## 2015-10-21 ENCOUNTER — Other Ambulatory Visit: Payer: Self-pay | Admitting: *Deleted

## 2015-10-21 DIAGNOSIS — B20 Human immunodeficiency virus [HIV] disease: Secondary | ICD-10-CM

## 2015-10-21 MED ORDER — DARUNAVIR ETHANOLATE 800 MG PO TABS: 800.0000 mg | ORAL_TABLET | Freq: Every day | ORAL | 11 refills | Status: DC

## 2015-10-22 ENCOUNTER — Other Ambulatory Visit: Payer: Self-pay | Admitting: *Deleted

## 2015-10-22 DIAGNOSIS — B20 Human immunodeficiency virus [HIV] disease: Secondary | ICD-10-CM

## 2015-10-22 MED ORDER — DARUNAVIR ETHANOLATE 800 MG PO TABS: 800.0000 mg | ORAL_TABLET | Freq: Every day | ORAL | 3 refills | Status: DC

## 2015-10-22 MED ORDER — ELVITEG-COBIC-EMTRICIT-TENOFAF 150-150-200-10 MG PO TABS: 1.0000 | ORAL_TABLET | Freq: Every day | ORAL | 3 refills | Status: DC

## 2015-10-28 NOTE — Addendum Note (Signed)
Addended by: Landis Gandy on: 10/28/2015 10:27 AM   Modules accepted: Orders

## 2015-11-11 ENCOUNTER — Other Ambulatory Visit: Payer: Self-pay | Admitting: Infectious Disease

## 2016-01-06 ENCOUNTER — Other Ambulatory Visit: Payer: Self-pay | Admitting: Internal Medicine

## 2016-03-01 ENCOUNTER — Other Ambulatory Visit: Payer: Self-pay | Admitting: *Deleted

## 2016-03-01 MED ORDER — OSELTAMIVIR PHOSPHATE 75 MG PO CAPS: 75.0000 mg | ORAL_CAPSULE | Freq: Every day | ORAL | 0 refills | Status: DC

## 2016-03-01 NOTE — Telephone Encounter (Signed)
Pt scheduled to see Arnett on 03/01/16

## 2016-03-01 NOTE — Telephone Encounter (Signed)
Last OV w/you 07/26/15.  Next OV w/ Denisa 07/25/16.  Please advise.

## 2016-03-01 NOTE — Telephone Encounter (Signed)
I called pt- he states his symptoms started yesterday AM. He still c/o body aches, chest congestion and HA. He states he has been taking Tylenol cold/sinus products.

## 2016-03-01 NOTE — Telephone Encounter (Signed)
Pt currently has a symptoms of the flu consisting of body aches and congestion with cough. Pt has insomnia at random times, he requested a call to discuss the issue of him not sleeping and possibly being prescribed a medication for the flu and his insomnia  Pt contact [phone]

## 2016-03-01 NOTE — Telephone Encounter (Signed)
YOU CAN SEND TAMIFLU 75 MG TWICE DAILY X 5 DAYS IF IT HAS NOT BEEN MORE THAN 48 HOURS SINCE ONSET OF SYMPTOMS.  ASK PATINENT AND SEND IF  APPROPRIATE  NEEDS OV TO DISCUSS INSOMNIA

## 2016-03-01 NOTE — Telephone Encounter (Deleted)
CANCEL MRI PATIENT DECLINED.

## 2016-03-01 NOTE — Telephone Encounter (Signed)
Tamiflu rx sent. See meds. Pt advised to keep OV tom 03/02/16 w/ Joycelyn Schmid.

## 2016-03-02 ENCOUNTER — Encounter: Payer: Self-pay | Admitting: Family

## 2016-03-02 ENCOUNTER — Ambulatory Visit (INDEPENDENT_AMBULATORY_CARE_PROVIDER_SITE_OTHER): Payer: Medicare Other | Admitting: Family

## 2016-03-02 VITALS — BP 120/84 | HR 74 | Temp 98.0°F | Ht 71.0 in | Wt 243.6 lb

## 2016-03-02 DIAGNOSIS — R69 Illness, unspecified: Secondary | ICD-10-CM

## 2016-03-02 DIAGNOSIS — J111 Influenza due to unidentified influenza virus with other respiratory manifestations: Secondary | ICD-10-CM

## 2016-03-02 NOTE — Patient Instructions (Signed)
Finish tamiflu  Pleasure meeting you  Let me know if you don't continue to get better

## 2016-03-02 NOTE — Progress Notes (Signed)
Pre visit review using our clinic review tool, if applicable. No additional management support is needed unless otherwise documented below in the visit note. 

## 2016-03-02 NOTE — Progress Notes (Signed)
Subjective:    Patient ID: MARGUIS DURRER, male    DOB: Aug 05, 1955, 61 y.o.   MRN: [id number]  CC: Adam Becker is a 61 y.o. male who presents today for an acute visit.    HPI: Chief complaint productive cough x 3 days, improved. No wheezing, SOB. Endorses sinus congestion which improved with mucinex. Some relief tyelonol cold and sinus. Feeling better on tamiflu which started today.   No lung disease. History of chronic kidney disease, HIV    HISTORY:  Past Medical History:  Diagnosis Date  . 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 Chronic kidney disease Nov 2013   admission for ATN cr 11, acidosis  . HIV infection ([place])   . Hyperlipidemia   . Hypertension   . Urethritis 02/11/2015   Past Surgical History:  Procedure Laterality Date  . COLONOSCOPY    . SPLENECTOMY  2000   reason unclear "stopped working"   Family History  Problem Relation Age of Onset  . Cancer Mother   . Breast cancer Mother   . Colon cancer Father   . Birth defects Son     Breast     Allergies: Patient has no known allergies. Current Outpatient Prescriptions on File Prior to Visit  Medication Sig Dispense Refill  . amLODipine (NORVASC) 5 MG tablet TAKE 1 TABLET (5 MG TOTAL) BY MOUTH DAILY. 30 tablet 3  . Darunavir Ethanolate (PREZISTA) 800 MG tablet Take 1 tablet (800 mg total) by mouth daily. 90 tablet 3  . elvitegravir-cobicistat-emtricitabine-tenofovir (GENVOYA) 150-150-200-10 MG TABS tablet Take 1 tablet by mouth daily with breakfast. 90 tablet 3  . lisinopril (PRINIVIL,ZESTRIL) 20 MG tablet Take 1 tablet (20 mg total) by mouth daily. 90 tablet 3  . oseltamivir (TAMIFLU) 75 MG capsule Take 1 capsule (75 mg total) by mouth daily. 10 capsule 0  . pravastatin (PRAVACHOL) 40 MG tablet TAKE 1 TABLET BY MOUTH EVERY DAY 90 tablet 1  . ULORIC 40 MG tablet TAKE 1 TABLET (40 MG TOTAL) BY MOUTH DAILY. 30 tablet 2   No current facility-administered medications on file prior to visit.     Social History    Substance Use Topics  . Smoking status: Never Smoker  . Smokeless tobacco: Never Used  . Alcohol use No    Review of Systems  Constitutional: Negative for chills and fever.  Respiratory: Positive for cough.   Cardiovascular: Negative for chest pain and palpitations.  Gastrointestinal: Negative for nausea and vomiting.      Objective:    BP 120/84   Pulse 74   Temp 98 F (36.7 C) (Oral)   Ht 5\' 11"  (1.803 m)   Wt 243 lb 9.6 oz (110.5 kg)   SpO2 96%   BMI 33.98 kg/m    Physical Exam  Constitutional: Vital signs are normal. He appears well-developed and well-nourished.  HENT:  Head: Normocephalic and atraumatic.  Right Ear: Hearing, tympanic membrane, external ear and ear canal normal. No drainage, swelling or tenderness. Tympanic membrane is not injected, not erythematous and not bulging. No middle ear effusion. No decreased hearing is noted.  Left Ear: Hearing, tympanic membrane, external ear and ear canal normal. No drainage, swelling or tenderness. Tympanic membrane is not injected, not erythematous and not bulging.  No middle ear effusion. No decreased hearing is noted.  Nose: Nose normal. Right sinus exhibits no maxillary sinus tenderness and no frontal sinus tenderness. Left sinus exhibits no maxillary sinus tenderness and no frontal sinus tenderness.  Mouth/Throat: Uvula is midline,  oropharynx is clear and moist and mucous membranes are normal. No oropharyngeal exudate, posterior oropharyngeal edema, posterior oropharyngeal erythema or tonsillar abscesses.  Eyes: Conjunctivae are normal.  Cardiovascular: Regular rhythm and normal heart sounds.   Pulmonary/Chest: Effort normal and breath sounds normal. No respiratory distress. He has no wheezes. He has no rhonchi. He has no rales.  Lymphadenopathy:       Head (right side): No submental, no submandibular, no tonsillar, no preauricular, no posterior auricular and no occipital adenopathy present.       Head (left side): No  submental, no submandibular, no tonsillar, no preauricular, no posterior auricular and no occipital adenopathy present.    He has no cervical adenopathy.  Neurological: He is alert.  Skin: Skin is warm and dry.  Psychiatric: He has a normal mood and affect. His speech is normal and behavior is normal.  Vitals reviewed.      Assessment & Plan:   1. Influenza-like illness Patient is well-appearing today. Afebrile. SaO2 96%. Patient was started on Tamiflu yesterday and improving. Advised continued close vigilance and to let us know if not better.     I am having Mr. Kennel maintain his amLODipine, lisinopril, Darunavir Ethanolate, elvitegravir-cobicistat-emtricitabine-tenofovir, pravastatin, ULORIC, and oseltamivir.   No orders of the defined types were placed in this encounter.   Return precautions given.   Risks, benefits, and alternatives of the medications and treatment plan prescribed today were discussed, and patient expressed understanding.   Education regarding symptom management and diagnosis given to patient on AVS.  Continue to follow with [name], [name], MD for routine health maintenance.   Ethel Rana and I agreed with plan.   Mable Paris, FNP

## 2016-03-21 ENCOUNTER — Other Ambulatory Visit: Payer: Medicare Other

## 2016-03-21 ENCOUNTER — Other Ambulatory Visit (HOSPITAL_COMMUNITY)
Admission: RE | Admit: 2016-03-21 | Discharge: 2016-03-21 | Disposition: A | Discharge status: Home / Self Care | Payer: Medicare Other | Source: Ambulatory Visit | Attending: Infectious Disease | Admitting: Infectious Disease

## 2016-03-21 DIAGNOSIS — Z23 Encounter for immunization: Secondary | ICD-10-CM

## 2016-03-21 DIAGNOSIS — Z113 Encounter for screening for infections with a predominantly sexual mode of transmission: Secondary | ICD-10-CM | POA: Diagnosis not present

## 2016-03-21 DIAGNOSIS — B20 Human immunodeficiency virus [HIV] disease: Secondary | ICD-10-CM | POA: Diagnosis not present

## 2016-03-21 LAB — CBC WITH DIFFERENTIAL/PLATELET
Basophils Absolute: 63 cells/uL (ref 0–200)
Basophils Relative: 1 %
Eosinophils Absolute: 252 cells/uL (ref 15–500)
Eosinophils Relative: 4 %
HCT: 42.4 % (ref 38.5–50.0)
Hemoglobin: 14.1 g/dL (ref 13.2–17.1)
Lymphocytes Relative: 50 %
Lymphs Abs: 3150 cells/uL (ref 850–3900)
MCH: 33 pg (ref 27.0–33.0)
MCHC: 33.3 g/dL (ref 32.0–36.0)
MCV: 99.3 fL (ref 80.0–100.0)
MPV: 8.9 fL (ref 7.5–12.5)
Monocytes Absolute: 693 cells/uL (ref 200–950)
Monocytes Relative: 11 %
Neutro Abs: 2142 cells/uL (ref 1500–7800)
Neutrophils Relative %: 34 %
Platelets: 416 10*3/uL — ABNORMAL HIGH (ref 140–400)
RBC: 4.27 MIL/uL (ref 4.20–5.80)
RDW: 14.3 % (ref 11.0–15.0)
WBC: 6.3 10*3/uL (ref 3.8–10.8)

## 2016-03-21 LAB — COMPLETE METABOLIC PANEL WITH GFR
ALT: 44 U/L (ref 9–46)
AST: 40 U/L — ABNORMAL HIGH (ref 10–35)
Albumin: 4 g/dL (ref 3.6–5.1)
Alkaline Phosphatase: 52 U/L (ref 40–115)
BUN: 13 mg/dL (ref 7–25)
CO2: 26 mmol/L (ref 20–31)
Calcium: 9.5 mg/dL (ref 8.6–10.3)
Chloride: 106 mmol/L (ref 98–110)
Creat: 1.3 mg/dL (ref 0.70–1.33)
GFR, Est African American: 69 mL/min (ref >=60)
GFR, Est Non African American: 60 mL/min (ref >=60)
Glucose, Bld: 114 mg/dL — ABNORMAL HIGH (ref 65–99)
Potassium: 4.6 mmol/L (ref 3.5–5.3)
Sodium: 140 mmol/L (ref 135–146)
Total Bilirubin: 0.4 mg/dL (ref 0.2–1.2)
Total Protein: 7.8 g/dL (ref 6.1–8.1)

## 2016-03-22 LAB — RPR

## 2016-03-22 LAB — URINE CYTOLOGY ANCILLARY ONLY
Chlamydia: NEGATIVE
Neisseria Gonorrhea: NEGATIVE

## 2016-03-23 LAB — T-HELPER CELL (CD4) - (RCID CLINIC ONLY)
CD4 % Helper T Cell: 25 % — ABNORMAL LOW (ref 33–55)
CD4 T Cell Abs: 850 /uL (ref 400–2700)

## 2016-03-23 LAB — HIV-1 RNA QUANT-NO REFLEX-BLD
HIV 1 RNA Quant: <20 copies/mL — AB
HIV-1 RNA Quant, Log: <1.3 Log copies/mL — AB

## 2016-03-27 ENCOUNTER — Other Ambulatory Visit: Payer: Self-pay | Admitting: Internal Medicine

## 2016-04-04 ENCOUNTER — Encounter: Payer: Self-pay | Admitting: Infectious Disease

## 2016-04-04 ENCOUNTER — Ambulatory Visit (INDEPENDENT_AMBULATORY_CARE_PROVIDER_SITE_OTHER): Payer: Medicare Other | Admitting: Infectious Disease

## 2016-04-04 VITALS — BP 136/97 | HR 69 | Temp 98.6°F | Ht 71.0 in | Wt 243.0 lb

## 2016-04-04 DIAGNOSIS — N182 Chronic kidney disease, stage 2 (mild): Secondary | ICD-10-CM | POA: Diagnosis not present

## 2016-04-04 DIAGNOSIS — R739 Hyperglycemia, unspecified: Secondary | ICD-10-CM | POA: Diagnosis not present

## 2016-04-04 DIAGNOSIS — B20 Human immunodeficiency virus [HIV] disease: Secondary | ICD-10-CM | POA: Diagnosis not present

## 2016-04-04 DIAGNOSIS — E785 Hyperlipidemia, unspecified: Secondary | ICD-10-CM

## 2016-04-04 DIAGNOSIS — A528 Late syphilis, latent: Secondary | ICD-10-CM

## 2016-04-04 DIAGNOSIS — R7301 Impaired fasting glucose: Secondary | ICD-10-CM

## 2016-04-04 DIAGNOSIS — I10 Essential (primary) hypertension: Secondary | ICD-10-CM

## 2016-04-04 DIAGNOSIS — Q8901 Asplenia (congenital): Secondary | ICD-10-CM

## 2016-04-04 HISTORY — DX: Hyperglycemia, unspecified: R73.9

## 2016-04-04 NOTE — Progress Notes (Signed)
Subjective:  Chief complaint: followup for HIV disease   Patient ID: Adam Becker, male    DOB: 09-Jul-1955, 61 y.o.   MRN: [id number]  HPI  61 year old man with HIV currently well controlled on Genvoya with Prezista. He has no specific complaints today. I had noticed that his blood sugars have been elevated on some of his recent labs.    Lab Results  Component Value Date   HIV1RNAQUANT <20 DETECTED (A) 03/21/2016   Lab Results  Component Value Date   CD4TABS 850 03/21/2016   CD4TABS 1,000 09/28/2015   CD4TABS 1,290 12/10/2014      Past Medical History:  Diagnosis Date  . 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 Chronic kidney disease Nov 2013   admission for ATN cr 11, acidosis  . HIV infection ([place])   . Hyperlipidemia   . Hypertension   . Urethritis 02/11/2015    Past Surgical History:  Procedure Laterality Date  . COLONOSCOPY    . SPLENECTOMY  2000   reason unclear "stopped working"    Family History  Problem Relation Age of Onset  . Cancer Mother   . Breast cancer Mother   . Colon cancer Father   . Birth defects Son     Breast       Social History   Social History  . Marital status: Single    Spouse name: N/A  . Number of children: N/A  . Years of education: N/A   Social History Main Topics  . Smoking status: Never Smoker  . Smokeless tobacco: Never Used  . Alcohol use No  . Drug use: No  . Sexual activity: Not Currently     Comment: declined condoms   Other Topics Concern  . None   Social History Narrative  . None    No Known Allergies   Current Outpatient Prescriptions:  .  amLODipine (NORVASC) 5 MG tablet, TAKE 1 TABLET (5 MG TOTAL) BY MOUTH DAILY., Disp: 30 tablet, Rfl: 3 .  Darunavir Ethanolate (PREZISTA) 800 MG tablet, Take 1 tablet (800 mg total) by mouth daily., Disp: 90 tablet, Rfl: 3 .  elvitegravir-cobicistat-emtricitabine-tenofovir (GENVOYA) 150-150-200-10 MG TABS tablet, Take 1 tablet by mouth daily with breakfast., Disp: 90 tablet,  Rfl: 3 .  lisinopril (PRINIVIL,ZESTRIL) 20 MG tablet, Take 1 tablet (20 mg total) by mouth daily., Disp: 90 tablet, Rfl: 3 .  pravastatin (PRAVACHOL) 40 MG tablet, TAKE 1 TABLET BY MOUTH EVERY DAY, Disp: 90 tablet, Rfl: 1 .  ULORIC 40 MG tablet, TAKE 1 TABLET (40 MG TOTAL) BY MOUTH DAILY., Disp: 30 tablet, Rfl: 2 .  oseltamivir (TAMIFLU) 75 MG capsule, Take 1 capsule (75 mg total) by mouth daily. (Patient not taking: Reported on 04/04/2016), Disp: 10 capsule, Rfl: 0   Review of Systems  Constitutional: Negative for activity change, appetite change, chills, diaphoresis, fatigue, fever and unexpected weight change.  HENT: Negative for congestion, rhinorrhea, sinus pressure, sneezing, sore throat and trouble swallowing.   Eyes: Negative for photophobia and visual disturbance.  Respiratory: Negative for cough, chest tightness, shortness of breath, wheezing and stridor.   Cardiovascular: Negative for chest pain, palpitations and leg swelling.  Gastrointestinal: Negative for abdominal distention, abdominal pain, anal bleeding, blood in stool, constipation, diarrhea, nausea and vomiting.  Genitourinary: Negative for difficulty urinating, dysuria, flank pain and hematuria.  Musculoskeletal: Negative for arthralgias, back pain, gait problem, joint swelling and myalgias.  Skin: Negative for color change, pallor, rash and wound.  Neurological: Negative for dizziness, tremors, weakness and  light-headedness.  Hematological: Negative for adenopathy. Does not bruise/bleed easily.  Psychiatric/Behavioral: Negative for agitation, behavioral problems, confusion, decreased concentration, dysphoric mood and sleep disturbance.       Objective:   Physical Exam  Constitutional: He is oriented to person, place, and time. He appears well-developed and well-nourished. No distress.  HENT:  Head: Normocephalic and atraumatic.  Mouth/Throat: Oropharynx is clear and moist. No oropharyngeal exudate.  Eyes: Conjunctivae  and EOM are normal. Pupils are equal, round, and reactive to light. No scleral icterus.  Neck: Normal range of motion. Neck supple. No JVD present.  Cardiovascular: Normal rate, regular rhythm and normal heart sounds.  Exam reveals no gallop and no friction rub.   No murmur heard. Pulmonary/Chest: Effort normal and breath sounds normal. No respiratory distress. He has no wheezes. He has no rales. He exhibits no tenderness.  Abdominal: He exhibits no distension and no mass. There is no tenderness. There is no rebound and no guarding.  Musculoskeletal: He exhibits no edema or tenderness.  Lymphadenopathy:    He has no cervical adenopathy.  Neurological: He is alert and oriented to person, place, and time. He exhibits normal muscle tone. Coordination normal.  Skin: Skin is warm and dry. He is not diaphoretic. No erythema. No pallor.  Psychiatric: He has a normal mood and affect. His behavior is normal. Judgment and thought content normal.          Assessment & Plan:  HIV: continue Genvoya and Prezista, RTC in 6 months and then yearly  Hyperglycemia: check A1c to ensure not becoming diabetic  HTN: followed with Dr. Derrel Nip  Vitals:   04/04/16 1518  BP: (!) 136/97  Pulse: 69  Temp: 98.6 F (37 C)     Hyperlipidemia: at goal  Lipid Panel     Component Value Date/Time   CHOL 147 09/28/2015 1134   TRIG 128 09/28/2015 1134   HDL 38 (L) 09/28/2015 1134   CHOLHDL 3.9 09/28/2015 1134   VLDL 26 09/28/2015 1134   LDLCALC 83 09/28/2015 1134   LDLDIRECT 99 07/26/2015 1700     Syphilis: titers NR   Asplenia:  Should have had menactra recently  CKD with AKI in the past an episode: stablized  We  spent greater than 25 minutes with the patient including greater than 50% of time in face to face counsel of the patient's HIV, his elevated BG , STD screening and prevention, HTN acute on chronic kidney disease, asplenia  and in coordination of his care.

## 2016-04-05 LAB — HEMOGLOBIN A1C
Hgb A1c MFr Bld: 5.3 % (ref <5.7)
Mean Plasma Glucose: 105 mg/dL

## 2016-05-23 ENCOUNTER — Other Ambulatory Visit: Payer: Self-pay | Admitting: Infectious Disease

## 2016-05-23 DIAGNOSIS — E785 Hyperlipidemia, unspecified: Secondary | ICD-10-CM

## 2016-06-15 ENCOUNTER — Other Ambulatory Visit: Payer: Self-pay | Admitting: Internal Medicine

## 2016-07-25 ENCOUNTER — Ambulatory Visit (INDEPENDENT_AMBULATORY_CARE_PROVIDER_SITE_OTHER): Payer: Medicare Other

## 2016-07-25 VITALS — BP 128/82 | HR 77 | Temp 98.0°F | Resp 14 | Ht 72.0 in | Wt 242.8 lb

## 2016-07-25 DIAGNOSIS — Z0000 Encounter for general adult medical examination without abnormal findings: Secondary | ICD-10-CM | POA: Diagnosis not present

## 2016-07-25 DIAGNOSIS — Z1389 Encounter for screening for other disorder: Secondary | ICD-10-CM

## 2016-07-25 DIAGNOSIS — Z1331 Encounter for screening for depression: Secondary | ICD-10-CM

## 2016-07-25 NOTE — Progress Notes (Signed)
Subjective:   Adam Becker is a 61 y.o. male who presents for Medicare Annual/Subsequent preventive examination.  Review of Systems:  No ROS.  Medicare Wellness Visit. Additional risk factors are reflected in the social history.  Cardiac Risk Factors include: advanced age (>25men, >36 women);male gender     Objective:    Vitals: BP 128/82 (BP Location: Left Arm, Patient Position: Sitting, Cuff Size: Normal)   Pulse 77   Temp 98 F (36.7 C) (Oral)   Resp 14   Ht 6' (1.829 m)   Wt 242 lb 12.8 oz (110.1 kg)   BMI 32.93 kg/m   Body mass index is 32.93 kg/m.  Tobacco History  Smoking Status  . Never Smoker  Smokeless Tobacco  . Never Used     Counseling given: Not Answered   Past Medical History:  Diagnosis Date  . 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 Chronic kidney disease Nov 2013   admission for ATN cr 11, acidosis  . HIV infection ([place])   . Hyperglycemia 04/04/2016  . Hyperlipidemia   . Hypertension   . Urethritis 02/11/2015   Past Surgical History:  Procedure Laterality Date  . COLONOSCOPY    . SPLENECTOMY  2000   reason unclear "stopped working"   Family History  Problem Relation Age of Onset  . Cancer Mother   . Breast cancer Mother   . Colon cancer Father   . Birth defects Son        Breast    History  Sexual Activity  . Sexual activity: Not Currently    Comment: declined condoms    Outpatient Encounter Prescriptions as of 07/25/2016  Medication Sig  . amLODipine (NORVASC) 5 MG tablet TAKE 1 TABLET (5 MG TOTAL) BY MOUTH DAILY.  . Darunavir Ethanolate (PREZISTA) 800 MG tablet Take 1 tablet (800 mg total) by mouth daily.  Marland Kitchen elvitegravir-cobicistat-emtricitabine-tenofovir (GENVOYA) 150-150-200-10 MG TABS tablet Take 1 tablet by mouth daily with breakfast.  . lisinopril (PRINIVIL,ZESTRIL) 20 MG tablet Take 1 tablet (20 mg total) by mouth daily.  . pravastatin (PRAVACHOL) 40 MG tablet TAKE 1 TABLET BY MOUTH EVERY DAY  . ULORIC 40 MG tablet TAKE 1 TABLET BY  MOUTH DAILY  . [DISCONTINUED] oseltamivir (TAMIFLU) 75 MG capsule Take 1 capsule (75 mg total) by mouth daily.   No facility-administered encounter medications on file as of 07/25/2016.     Activities of Daily Living In your present state of health, do you have any difficulty performing the following activities: 07/25/2016  Hearing? N  Vision? N  Difficulty concentrating or making decisions? N  Walking or climbing stairs? N  Dressing or bathing? N  Doing errands, shopping? N  Preparing Food and eating ? N  Using the Toilet? N  In the past six months, have you accidently leaked urine? N  Do you have problems with loss of bowel control? N  Managing your Medications? N  Managing your Finances? N  Housekeeping or managing your Housekeeping? N  Some recent data might be hidden    Patient Care Team: [name], MD as PCP - General (Internal Medicine) [name], [name], MD as PCP - Infectious Diseases (Infectious Diseases)   Assessment:    This is a routine wellness examination for [name]. The goal of the wellness visit is to assist the patient how to close the gaps in care and create a preventative care plan for the patient.   The roster of all physicians providing medical care to patient is listed  in the Snapshot section of the chart.  Osteoporosis risk reviewed.    Safety issues reviewed; Smoke and carbon monoxide detectors in the home. No firearms in the home.  Wears seatbelts when driving or riding with others. Patient does wear sunscreen or protective clothing when in direct sunlight. No violence in the home.  Depression- PHQ 2 &9 complete.  No signs/symptoms or verbal communication regarding little pleasure in doing things, feeling down, depressed or hopeless. No changes in sleeping, energy, eating, concentrating.  No thoughts of self harm or harm towards others.  Time spent on this topic is 10 minutes.   Patient is alert, normal appearance, oriented to person/place/and  time.  Correctly identified the president of the [place], recall of 3/3 words, and performing simple calculations. Displays appropriate judgement and can read correct time from watch face.   No new identified risk were noted.  No failures at ADL's or IADL's.    BMI- discussed the importance of a healthy diet, water intake and the benefits of aerobic exercise. Educational material provided.   24 hour diet recall: Breakfast: none Lunch: franks and crackers Dinner: spaghetti  Daily fluid intake: 2 cups of caffeine, 4 cups of water  Sleep patterns- Sleeps 8 hours at night.  Wakes feeling rested.  TDAP vaccine deferred per patient preference.  Follow up with insurance.  Educational material provided.  Patient Concerns: None at this time. Follow up with PCP as needed.  Exercise Activities and Dietary recommendations Current Exercise Habits: Home exercise routine, Type of exercise: walking, Time (Minutes): 45, Frequency (Times/Week): 5, Weekly Exercise (Minutes/Week): 225, Intensity: Moderate  Goals    . Healthy Lifestyle          Low carb foods Stay hydrated Stay active and continue to walk for exercise Keep all scheduled appointments/labs, follow up regularly      Fall Risk Fall Risk  07/25/2016 04/04/2016 03/02/2016 10/12/2015 02/11/2015  Falls in the past year? No No No No No   Depression Screen PHQ 2/9 Scores 07/25/2016 04/04/2016 03/02/2016 10/12/2015  PHQ - 2 Score 0 0 0 0    Cognitive Function MMSE - Mini Mental State Exam 07/25/2016  Orientation to time 5  Orientation to Place 5  Registration 3  Attention/ Calculation 5  Recall 3  Language- name 2 objects 2  Language- repeat 1  Language- follow 3 step command 3  Language- read & follow direction 1  Write a sentence 1  Copy design 1  Total score 30        Immunization History  Administered Date(s) Administered  . H1N1 01/02/2008  . Hepatitis A 12/31/2006, 07/15/2007  . Hepatitis B 03/26/2001, 05/06/2001, 10/14/2001    . Influenza Split 11/15/2010, 12/07/2011  . Influenza Whole 10/02/2005, 10/15/2006, 11/26/2008, 11/01/2009  . Influenza,inj,Quad PF,36+ Mos 10/07/2012, 11/05/2013, 11/11/2014, 10/12/2015  . Meningococcal Polysaccharide 10/12/2015  . Pneumococcal Conjugate-13 06/21/2011  . Pneumococcal Polysaccharide-23 09/23/2005, 10/02/2005, 02/29/2012   Screening Tests Health Maintenance  Topic Date Due  . TETANUS/TDAP  08/22/2017 (Originally 10/03/1975)  . INFLUENZA VACCINE  08/23/2016  . COLONOSCOPY  11/08/2023  . Hepatitis C Screening  Completed  . HIV Screening  Completed      Plan:    End of life planning; Advance aging; Advanced directives discussed. No HCPOA/Living Will.  Additional information  declined.      I have personally reviewed and noted the following in the patient's chart:   . Medical and social history . Use of alcohol, tobacco or illicit drugs  .  Current medications and supplements . Functional ability and status . Nutritional status . Physical activity . Advanced directives . List of other physicians . Hospitalizations, surgeries, and ER visits in previous 12 months . Vitals . Screenings to include cognitive, depression, and falls . Referrals and appointments  In addition, I have reviewed and discussed with patient certain preventive protocols, quality metrics, and best practice recommendations. A written personalized care plan for preventive services as well as general preventive health recommendations were provided to patient.     Varney Biles, LPN  07/28/7970

## 2016-07-25 NOTE — Patient Instructions (Addendum)
  Adam Becker , Thank you for taking time to come for your Medicare Wellness Visit. I appreciate your ongoing commitment to your health goals. Please review the following plan we discussed and let me know if I can assist you in the future.   Follow up with Dr. Derrel Nip as needed.    Have a great day!  These are the goals we discussed: Goals    . Healthy Lifestyle          Low carb foods Stay hydrated Stay active and continue to walk for exercise Keep all scheduled appointments/labs, follow up regularly       This is a list of the screening recommended for you and due dates:  Health Maintenance  Topic Date Due  . Tetanus Vaccine  08/22/2017*  . Flu Shot  08/23/2016  . Colon Cancer Screening  11/08/2023  .  Hepatitis C: One time screening is recommended by Center for Disease Control  (CDC) for  adults born from [birth year] through 1964.   Completed  . HIV Screening  Completed  *Topic was postponed. The date shown is not the original due date.

## 2016-08-30 ENCOUNTER — Other Ambulatory Visit: Payer: Self-pay | Admitting: Infectious Disease

## 2016-08-30 DIAGNOSIS — B20 Human immunodeficiency virus [HIV] disease: Secondary | ICD-10-CM

## 2016-08-30 MED ORDER — DARUNAVIR ETHANOLATE 800 MG PO TABS: 800.0000 mg | ORAL_TABLET | Freq: Every day | ORAL | 3 refills | Status: DC

## 2016-09-01 ENCOUNTER — Other Ambulatory Visit: Payer: Self-pay | Admitting: Internal Medicine

## 2016-09-04 ENCOUNTER — Other Ambulatory Visit: Payer: Self-pay | Admitting: Internal Medicine

## 2016-09-26 ENCOUNTER — Other Ambulatory Visit: Payer: Medicare Other

## 2016-09-26 ENCOUNTER — Other Ambulatory Visit: Payer: Self-pay | Admitting: Infectious Disease

## 2016-09-26 ENCOUNTER — Other Ambulatory Visit (HOSPITAL_COMMUNITY)
Admission: RE | Admit: 2016-09-26 | Discharge: 2016-09-26 | Disposition: A | Discharge status: Home / Self Care | Payer: Medicare Other | Source: Ambulatory Visit | Attending: Infectious Disease | Admitting: Infectious Disease

## 2016-09-26 DIAGNOSIS — B20 Human immunodeficiency virus [HIV] disease: Secondary | ICD-10-CM

## 2016-09-26 LAB — CBC WITH DIFFERENTIAL/PLATELET
Basophils Absolute: 69 cells/uL (ref 0–200)
Basophils Relative: 1 %
Eosinophils Absolute: 345 cells/uL (ref 15–500)
Eosinophils Relative: 5 %
HCT: 41.9 % (ref 38.5–50.0)
Hemoglobin: 14.3 g/dL (ref 13.2–17.1)
Lymphocytes Relative: 58 %
Lymphs Abs: 4002 cells/uL — ABNORMAL HIGH (ref 850–3900)
MCH: 33.8 pg — ABNORMAL HIGH (ref 27.0–33.0)
MCHC: 34.1 g/dL (ref 32.0–36.0)
MCV: 99.1 fL (ref 80.0–100.0)
MPV: 8.8 fL (ref 7.5–12.5)
Monocytes Absolute: 621 cells/uL (ref 200–950)
Monocytes Relative: 9 %
Neutro Abs: 1863 cells/uL (ref 1500–7800)
Neutrophils Relative %: 27 %
Platelets: 366 10*3/uL (ref 140–400)
RBC: 4.23 MIL/uL (ref 4.20–5.80)
RDW: 14.6 % (ref 11.0–15.0)
WBC: 6.9 10*3/uL (ref 3.8–10.8)

## 2016-09-27 LAB — COMPLETE METABOLIC PANEL WITH GFR
ALT: 43 U/L (ref 9–46)
AST: 40 U/L — ABNORMAL HIGH (ref 10–35)
Albumin: 4.4 g/dL (ref 3.6–5.1)
Alkaline Phosphatase: 55 U/L (ref 40–115)
BUN: 17 mg/dL (ref 7–25)
CO2: 22 mmol/L (ref 20–32)
Calcium: 9.7 mg/dL (ref 8.6–10.3)
Chloride: 103 mmol/L (ref 98–110)
Creat: 1.26 mg/dL (ref 0.70–1.33)
GFR, Est African American: 72 mL/min (ref >=60)
GFR, Est Non African American: 62 mL/min (ref >=60)
Glucose, Bld: 102 mg/dL — ABNORMAL HIGH (ref 65–99)
Potassium: 4.8 mmol/L (ref 3.5–5.3)
Sodium: 138 mmol/L (ref 135–146)
Total Bilirubin: 0.4 mg/dL (ref 0.2–1.2)
Total Protein: 7.8 g/dL (ref 6.1–8.1)

## 2016-09-27 LAB — T-HELPER CELL (CD4) - (RCID CLINIC ONLY)
CD4 % Helper T Cell: 32 % — ABNORMAL LOW (ref 33–55)
CD4 T Cell Abs: 1460 /uL (ref 400–2700)

## 2016-09-27 LAB — FLUORESCENT TREPONEMAL AB(FTA)-IGG-BLD: Fluorescent Treponemal ABS: REACTIVE — AB

## 2016-09-27 LAB — RPR TITER: RPR Titer: 1:2 {titer}

## 2016-09-27 LAB — RPR: RPR Ser Ql: REACTIVE — AB

## 2016-09-28 LAB — HIV-1 RNA QUANT-NO REFLEX-BLD
HIV 1 RNA Quant: <20 copies/mL — AB
HIV-1 RNA Quant, Log: <1.3 Log copies/mL — AB

## 2016-09-28 LAB — URINE CYTOLOGY ANCILLARY ONLY
Chlamydia: NEGATIVE
Neisseria Gonorrhea: NEGATIVE

## 2016-10-10 ENCOUNTER — Ambulatory Visit (INDEPENDENT_AMBULATORY_CARE_PROVIDER_SITE_OTHER): Payer: Medicare Other | Admitting: Infectious Disease

## 2016-10-10 ENCOUNTER — Encounter: Payer: Self-pay | Admitting: Infectious Disease

## 2016-10-10 VITALS — BP 133/76 | HR 81 | Temp 98.3°F | Ht 71.0 in | Wt 243.0 lb

## 2016-10-10 DIAGNOSIS — A528 Late syphilis, latent: Secondary | ICD-10-CM

## 2016-10-10 DIAGNOSIS — I10 Essential (primary) hypertension: Secondary | ICD-10-CM | POA: Diagnosis not present

## 2016-10-10 DIAGNOSIS — Z113 Encounter for screening for infections with a predominantly sexual mode of transmission: Secondary | ICD-10-CM | POA: Diagnosis not present

## 2016-10-10 DIAGNOSIS — Q8901 Asplenia (congenital): Secondary | ICD-10-CM

## 2016-10-10 DIAGNOSIS — B20 Human immunodeficiency virus [HIV] disease: Secondary | ICD-10-CM | POA: Diagnosis present

## 2016-10-10 DIAGNOSIS — A523 Neurosyphilis, unspecified: Secondary | ICD-10-CM

## 2016-10-10 DIAGNOSIS — Z125 Encounter for screening for malignant neoplasm of prostate: Secondary | ICD-10-CM | POA: Insufficient documentation

## 2016-10-10 DIAGNOSIS — Z23 Encounter for immunization: Secondary | ICD-10-CM | POA: Insufficient documentation

## 2016-10-10 DIAGNOSIS — E785 Hyperlipidemia, unspecified: Secondary | ICD-10-CM | POA: Diagnosis not present

## 2016-10-10 MED ORDER — DARUNAVIR ETHANOLATE 800 MG PO TABS: 800.0000 mg | ORAL_TABLET | Freq: Every day | ORAL | 4 refills | Status: DC

## 2016-10-10 MED ORDER — ELVITEG-COBIC-EMTRICIT-TENOFAF 150-150-200-10 MG PO TABS: 1.0000 | ORAL_TABLET | Freq: Every day | ORAL | 4 refills | Status: DC

## 2016-10-10 NOTE — Progress Notes (Signed)
Subjective:  Chief complaint: followup for HIV disease   Patient ID: Adam Becker, male    DOB: 28-Mar-1955, 61 y.o.   MRN: [id number]  HPI  61  year old man with HIV currently well controlled on Genvoya with Prezista.  He is doing well. We reviewed all of his relevant labs.  Lab Results  Component Value Date   HIV1RNAQUANT <20 DETECTED (A) 09/26/2016   HIV1RNAQUANT <20 DETECTED (A) 03/21/2016   HIV1RNAQUANT 69 (H) 09/28/2015    Lab Results  Component Value Date   CD4TABS 1,460 09/26/2016   CD4TABS 850 03/21/2016   CD4TABS 1,000 09/28/2015      Past Medical History:  Diagnosis Date  . 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 Chronic kidney disease Nov 2013   admission for [facility] cr 11, acidosis  . HIV infection ([place])   . Hyperglycemia 04/04/2016  . Hyperlipidemia   . Hypertension   . Urethritis 02/11/2015    Past Surgical History:  Procedure Laterality Date  . COLONOSCOPY    . SPLENECTOMY  2000   reason unclear "stopped working"    Family History  Problem Relation Age of Onset  . Cancer Mother   . Breast cancer Mother   . Colon cancer Father   . Birth defects Son        Breast       Social History   Social History  . Marital status: Single    Spouse name: N/A  . Number of children: N/A  . Years of education: N/A   Social History Main Topics  . Smoking status: Never Smoker  . Smokeless tobacco: Never Used  . Alcohol use No  . Drug use: No  . Sexual activity: Not Currently     Comment: declined condoms   Other Topics Concern  . None   Social History Narrative  . None    No Known Allergies   Current Outpatient Prescriptions:  .  amLODipine (NORVASC) 5 MG tablet, TAKE 1 TABLET (5 MG TOTAL) BY MOUTH DAILY., Disp: 30 tablet, Rfl: 3 .  darunavir (PREZISTA) 800 MG tablet, Take 1 tablet (800 mg total) by mouth daily., Disp: 90 tablet, Rfl: 3 .  elvitegravir-cobicistat-emtricitabine-tenofovir (GENVOYA) 150-150-200-10 MG TABS tablet, Take 1 tablet by mouth daily  with breakfast., Disp: 90 tablet, Rfl: 3 .  GENVOYA 150-150-200-10 MG TABS tablet, TAKE 1 TABLET BY MOUTH DAILY WITH BREAKFAST., Disp: 90 tablet, Rfl: 3 .  lisinopril (PRINIVIL,ZESTRIL) 20 MG tablet, TAKE 1 TABLET (20 MG TOTAL) BY MOUTH DAILY., Disp: 90 tablet, Rfl: 3 .  pravastatin (PRAVACHOL) 40 MG tablet, TAKE 1 TABLET BY MOUTH EVERY DAY, Disp: 90 tablet, Rfl: 3 .  ULORIC 40 MG tablet, TAKE 1 TABLET BY MOUTH EVERY DAY, Disp: 30 tablet, Rfl: 2   Review of Systems  Constitutional: Negative for activity change, appetite change, chills, diaphoresis, fever and unexpected weight change.  HENT: Negative for congestion, rhinorrhea, sinus pressure, sneezing, sore throat and trouble swallowing.   Eyes: Negative for photophobia and visual disturbance.  Respiratory: Negative for cough, chest tightness, shortness of breath, wheezing and stridor.   Cardiovascular: Negative for chest pain, palpitations and leg swelling.  Gastrointestinal: Negative for abdominal distention, abdominal pain, anal bleeding, blood in stool, constipation, diarrhea, nausea and vomiting.  Genitourinary: Negative for difficulty urinating, dysuria, flank pain and hematuria.  Musculoskeletal: Negative for arthralgias, back pain, gait problem, joint swelling and myalgias.  Skin: Negative for color change, pallor, rash and wound.  Neurological: Negative for dizziness, tremors, weakness and light-headedness.  Hematological: Negative for adenopathy. Does not bruise/bleed easily.  Psychiatric/Behavioral: Negative for agitation, behavioral problems, confusion, decreased concentration, dysphoric mood and sleep disturbance.       Objective:   Physical Exam  Constitutional: He is oriented to person, place, and time. He appears well-developed and well-nourished. No distress.  HENT:  Head: Normocephalic and atraumatic.  Mouth/Throat: Oropharynx is clear and moist. No oropharyngeal exudate.  Eyes: Pupils are equal, round, and reactive to  light. Conjunctivae and EOM are normal. No scleral icterus.  Neck: Normal range of motion. Neck supple. No JVD present.  Cardiovascular: Normal rate, regular rhythm and normal heart sounds.   Pulmonary/Chest: Effort normal. No respiratory distress. He has no wheezes.  Abdominal: He exhibits no distension.  Musculoskeletal: He exhibits no edema.  Neurological: He is alert and oriented to person, place, and time. Coordination normal.  Skin: Skin is warm and dry. No rash noted. He is not diaphoretic. No erythema.  Psychiatric: He has a normal mood and affect. His behavior is normal. Judgment and thought content normal.          Assessment & Plan:  HIV: continue Genvoya and Prezista, RTC in one year   Hyperglycemia: a1c was normal  HTN: followed with Dr. Derrel Nip  Vitals:   10/10/16 1516  BP: 133/76  Pulse: 81  Temp: 98.3 F (36.8 C)     Hyperlipidemia: at goal  Lipid Panel     Component Value Date/Time   CHOL 147 09/28/2015 1134   TRIG 128 09/28/2015 1134   HDL 38 (L) 09/28/2015 1134   CHOLHDL 3.9 09/28/2015 1134   VLDL 26 09/28/2015 1134   LDLCALC 83 09/28/2015 1134   LDLDIRECT 99 07/26/2015 1700     Syphilis: titers low NCS   Asplenia:  He has had menactra in Sept 20`17, 13 valent pneumococcal vaccine 2013 and 23 valent pneumococcal vaccine  2014  CKD with AKI in the past an episode: stablized

## 2016-11-16 ENCOUNTER — Other Ambulatory Visit: Payer: Self-pay

## 2016-11-16 MED ORDER — FEBUXOSTAT 40 MG PO TABS: 40.0000 mg | ORAL_TABLET | Freq: Every day | ORAL | 0 refills | Status: DC

## 2016-11-17 ENCOUNTER — Other Ambulatory Visit: Payer: Self-pay | Admitting: *Deleted

## 2016-11-17 DIAGNOSIS — E785 Hyperlipidemia, unspecified: Secondary | ICD-10-CM

## 2016-11-17 MED ORDER — PRAVASTATIN SODIUM 40 MG PO TABS: 40.0000 mg | ORAL_TABLET | Freq: Every day | ORAL | 3 refills | Status: DC

## 2016-12-19 ENCOUNTER — Ambulatory Visit: Payer: Self-pay | Admitting: *Deleted

## 2016-12-19 NOTE — Telephone Encounter (Signed)
Pt has a cold but has had a cough with congestion for over 2 weeks. He has some expectorant that is yellow and dark brown at times. Cough is worst at night. Is taking cold med but not helping the cough. Care advice given with verbal understanding.  Reason for Disposition . Cough has been present for > 3 weeks  Answer Assessment - Initial Assessment Questions 1. ONSET: "When did the cough begin?"      Couple of weeks ago 2. SEVERITY: "How bad is the cough today?"      Not too bad 3. RESPIRATORY DISTRESS: "Describe your breathing."      Breathing ok 4. FEVER: "Do you have a fever?" If so, ask: "What is your temperature, how was it measured, and when did it start?"     no 5. SPUTUM: "Describe the color of your sputum" (clear, white, yellow, green)     Yellow dark brown 6. HEMOPTYSIS: "Are you coughing up any blood?" If so ask: "How much?" (flecks, streaks, tablespoons, etc.)     At first 7. CARDIAC HISTORY: "Do you have any history of heart disease?" (e.g., heart attack, congestive heart failure)      no 8. LUNG HISTORY: "Do you have any history of lung disease?"  (e.g., pulmonary embolus, asthma, emphysema)     no 9. PE RISK FACTORS: "Do you have a history of blood clots?" (or: recent major surgery, recent prolonged travel, bedridden )     no 10. OTHER SYMPTOMS: "Do you have any other symptoms?" (e.g., runny nose, wheezing, chest pain)       no 11. PREGNANCY: "Is there any chance you are pregnant?" "When was your last menstrual period?"       no 12. TRAVEL: "Have you traveled out of the country in the last month?" (e.g., travel history, exposures)       no  Protocols used: [place]

## 2016-12-19 NOTE — Telephone Encounter (Signed)
FYI

## 2017-01-05 DIAGNOSIS — J209 Acute bronchitis, unspecified: Secondary | ICD-10-CM | POA: Diagnosis not present

## 2017-01-05 DIAGNOSIS — R05 Cough: Secondary | ICD-10-CM | POA: Diagnosis not present

## 2017-01-28 DIAGNOSIS — M10071 Idiopathic gout, right ankle and foot: Secondary | ICD-10-CM | POA: Diagnosis not present

## 2017-01-29 ENCOUNTER — Telehealth: Payer: Self-pay | Admitting: Internal Medicine

## 2017-01-29 MED ORDER — PREDNISONE 10 MG PO TABS: ORAL_TABLET | ORAL | 0 refills | Status: DC

## 2017-01-29 NOTE — Telephone Encounter (Signed)
Patient stated that he is having a gout flare up that started Saturday night in his right foot and now is in his right knee also. Patient has a history of gout. He says that he thinks you normally call in prednisone when he has a flare up but he takes uloric daily. Patient denies having any other symptoms. Please advise

## 2017-01-29 NOTE — Telephone Encounter (Signed)
Patient needs to be scheduled for OV with Dr. Derrel Nip, he has not been seen since 07/2015. Please let him know meds have been called in also.

## 2017-01-29 NOTE — Telephone Encounter (Signed)
Prednisone taper called. In   Last OV was July 20017!!! No additonal meds or refills until he is SEEN!@!!!

## 2017-01-29 NOTE — Telephone Encounter (Signed)
Copied from [place]. Topic: Quick Communication - See Telephone Encounter >> Jan 29, 2017  9:37 AM Bea Graff, NT wrote: CRM for notification. See Telephone encounter for: Pt would like a call from Dr. Derrel Nip or her assistant to see if he can get something called in for his gout flare up that began on Saturday.   01/29/17.

## 2017-02-28 NOTE — Telephone Encounter (Signed)
Patient having another flare up, patient is scheduled for 03/15/17. Can something be called in for him till his appt? Please advise Call back [phone]

## 2017-02-28 NOTE — Telephone Encounter (Signed)
Patient notified and voiced understanding.

## 2017-02-28 NOTE — Telephone Encounter (Signed)
Pt would like call back - he states no medication was called pharmacy - please call pt at [phone]

## 2017-03-01 ENCOUNTER — Other Ambulatory Visit: Payer: Self-pay | Admitting: *Deleted

## 2017-03-01 MED ORDER — PREDNISONE 10 MG PO TABS: ORAL_TABLET | ORAL | 0 refills | Status: DC

## 2017-03-01 NOTE — Telephone Encounter (Signed)
Patient notified medication sent to pharmacy

## 2017-03-12 ENCOUNTER — Other Ambulatory Visit: Payer: Self-pay | Admitting: Internal Medicine

## 2017-03-15 ENCOUNTER — Encounter: Payer: Self-pay | Admitting: Internal Medicine

## 2017-03-15 ENCOUNTER — Ambulatory Visit (INDEPENDENT_AMBULATORY_CARE_PROVIDER_SITE_OTHER): Payer: Medicare Other | Admitting: Internal Medicine

## 2017-03-15 VITALS — BP 104/68 | HR 76 | Temp 97.9°F | Resp 15 | Ht 71.0 in | Wt 245.0 lb

## 2017-03-15 DIAGNOSIS — E66811 Obesity, class 1: Secondary | ICD-10-CM

## 2017-03-15 DIAGNOSIS — Z125 Encounter for screening for malignant neoplasm of prostate: Secondary | ICD-10-CM | POA: Diagnosis not present

## 2017-03-15 DIAGNOSIS — M10372 Gout due to renal impairment, left ankle and foot: Secondary | ICD-10-CM

## 2017-03-15 DIAGNOSIS — E7800 Pure hypercholesterolemia, unspecified: Secondary | ICD-10-CM | POA: Diagnosis not present

## 2017-03-15 DIAGNOSIS — I10 Essential (primary) hypertension: Secondary | ICD-10-CM | POA: Diagnosis not present

## 2017-03-15 DIAGNOSIS — E669 Obesity, unspecified: Secondary | ICD-10-CM | POA: Diagnosis not present

## 2017-03-15 DIAGNOSIS — R7301 Impaired fasting glucose: Secondary | ICD-10-CM

## 2017-03-15 DIAGNOSIS — Z0000 Encounter for general adult medical examination without abnormal findings: Secondary | ICD-10-CM

## 2017-03-15 DIAGNOSIS — N182 Chronic kidney disease, stage 2 (mild): Secondary | ICD-10-CM | POA: Diagnosis not present

## 2017-03-15 LAB — COMPREHENSIVE METABOLIC PANEL
ALT: 42 U/L (ref 0–53)
AST: 33 U/L (ref 0–37)
Albumin: 4.1 g/dL (ref 3.5–5.2)
Alkaline Phosphatase: 55 U/L (ref 39–117)
BUN: 14 mg/dL (ref 6–23)
CO2: 26 mEq/L (ref 19–32)
Calcium: 9.9 mg/dL (ref 8.4–10.5)
Chloride: 103 mEq/L (ref 96–112)
Creatinine, Ser: 1.28 mg/dL (ref 0.40–1.50)
GFR: 73.61 mL/min (ref >60.00)
Glucose, Bld: 110 mg/dL — ABNORMAL HIGH (ref 70–99)
Potassium: 4.8 mEq/L (ref 3.5–5.1)
Sodium: 138 mEq/L (ref 135–145)
Total Bilirubin: 0.3 mg/dL (ref 0.2–1.2)
Total Protein: 7.6 g/dL (ref 6.0–8.3)

## 2017-03-15 LAB — LIPID PANEL
Cholesterol: 170 mg/dL (ref 0–200)
HDL: 40.4 mg/dL (ref >39.00)
LDL Cholesterol: 105 mg/dL — ABNORMAL HIGH (ref 0–99)
NonHDL: 129.34
Total CHOL/HDL Ratio: 4
Triglycerides: 122 mg/dL (ref 0.0–149.0)
VLDL: 24.4 mg/dL (ref 0.0–40.0)

## 2017-03-15 LAB — PSA, MEDICARE: PSA: 0.65 ng/ml (ref 0.10–4.00)

## 2017-03-15 LAB — URIC ACID: Uric Acid, Serum: 5.4 mg/dL (ref 4.0–7.8)

## 2017-03-15 MED ORDER — TETANUS-DIPHTH-ACELL PERTUSSIS 5-2.5-18.5 LF-MCG/0.5 IM SUSP: 0.5000 mL | Freq: Once | INTRAMUSCULAR | 0 refills | Status: AC

## 2017-03-15 NOTE — Progress Notes (Signed)
Subjective:  Patient ID: Adam Becker, male    DOB: [birth year]  Age: 62 y.o. MRN: [id number]  CC: The primary encounter diagnosis was Acute gout due to renal impairment involving left foot. Diagnoses of Screening for prostate cancer, Pure hypercholesterolemia, HYPERTENSION, BENIGN ESSENTIAL, Obesity (BMI 30.0-34.9), Impaired fasting glucose, Chronic kidney disease (CKD), stage II (mild), and Routine adult health maintenance were also pertinent to this visit.  HPI Adam Becker presents for follow up on hypertension hyperlipidemia , CKD  And obesity. Last seen July 2017  HIV and history of neurosphyilis:  Managed by [facility] with surveillance labs every 6 months.    Lab Results  Component Value Date   PSA 0.65 03/15/2017   PSA 0.61 11/19/2014   Defers dermatology.  Last eye exam was 3 years ago  Has not seen dentist in years    no longer sexually active.   Active Member of [employer]  occasional insomnia .  Rises at 6:00  Every day despite  Working 3 days per week.  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  Walks daily for at least 30 minutes. Has started cutting back on snacking at night.     Had a gout flare one month ago left foot. Treated with prednisone   Outpatient Medications Prior to Visit  Medication Sig Dispense Refill  . amLODipine (NORVASC) 5 MG tablet TAKE 1 TABLET (5 MG TOTAL) BY MOUTH DAILY. 30 tablet 3  . darunavir (PREZISTA) 800 MG tablet Take 1 tablet (800 mg total) by mouth daily. 90 tablet 4  . elvitegravir-cobicistat-emtricitabine-tenofovir (GENVOYA) 150-150-200-10 MG TABS tablet Take 1 tablet by mouth daily with breakfast. 90 tablet 4  . lisinopril (PRINIVIL,ZESTRIL) 20 MG tablet TAKE 1 TABLET (20 MG TOTAL) BY MOUTH DAILY. 90 tablet 3  . pravastatin (PRAVACHOL) 40 MG tablet Take 1 tablet (40 mg total) by mouth daily. 90 tablet 3  . ULORIC 40 MG tablet TAKE 1 TABLET BY MOUTH EVERY DAY 90 tablet 0  . GENVOYA 150-150-200-10 MG TABS tablet  TAKE 1 TABLET BY MOUTH DAILY WITH BREAKFAST. (Patient not taking: Reported on 03/15/2017) 90 tablet 3  . predniSONE (DELTASONE) 10 MG tablet 6 tablets on Day 1 , then reduce by 1 tablet daily until gone (Patient not taking: Reported on 03/15/2017) 21 tablet 0   No facility-administered medications prior to visit.     Review of Systems;  Patient denies headache, fevers, malaise, unintentional weight loss, skin rash, eye pain, sinus congestion and sinus pain, sore throat, dysphagia,  hemoptysis , cough, dyspnea, wheezing, chest pain, palpitations, orthopnea, edema, abdominal pain, nausea, melena, diarrhea, constipation, flank pain, dysuria, hematuria, urinary  Frequency, nocturia, numbness, tingling, seizures,  Focal weakness, Loss of consciousness,  Tremor, insomnia, depression, anxiety, and suicidal ideation.      Objective:  BP 104/68 (BP Location: Left Arm, Patient Position: Sitting, Cuff Size: Large)   Pulse 76   Temp 97.9 F (36.6 C) (Oral)   Resp 15   Ht 5\' 11"  (1.803 m)   Wt 245 lb (111.1 kg)   SpO2 96%   BMI 34.17 kg/m   BP Readings from Last 3 Encounters:  03/15/17 104/68  10/10/16 133/76  07/25/16 128/82    Wt Readings from Last 3 Encounters:  03/15/17 245 lb (111.1 kg)  10/10/16 243 lb (110.2 kg)  07/25/16 242 lb 12.8 oz (110.1 kg)    General appearance: alert, cooperative and appears stated age Ears: normal TM's and external ear  canals both ears Throat: lips, mucosa, and tongue normal; teeth and gums normal Neck: no adenopathy, no carotid bruit, supple, symmetrical, trachea midline and thyroid not enlarged, symmetric, no tenderness/mass/nodules Back: symmetric, no curvature. ROM normal. No CVA tenderness. Lungs: clear to auscultation bilaterally Heart: regular rate and rhythm, S1, S2 normal, no murmur, click, rub or gallop Abdomen: soft, non-tender; bowel sounds normal; no masses,  no organomegaly Pulses: 2+ and symmetric Skin: Skin color, texture, turgor normal.  No rashes or lesions Lymph nodes: Cervical, supraclavicular, and axillary nodes normal.  Lab Results  Component Value Date   HGBA1C 5.3 04/04/2016   HGBA1C 5.7 (H) 07/26/2015    Lab Results  Component Value Date   CREATININE 1.28 03/15/2017   CREATININE 1.26 09/26/2016   CREATININE 1.30 03/21/2016    Lab Results  Component Value Date   WBC 6.9 09/26/2016   HGB 14.3 09/26/2016   HCT 41.9 09/26/2016   PLT 366 09/26/2016   GLUCOSE 110 (H) 03/15/2017   CHOL 170 03/15/2017   TRIG 122.0 03/15/2017   HDL 40.40 03/15/2017   LDLDIRECT 99 07/26/2015   LDLCALC 105 (H) 03/15/2017   ALT 42 03/15/2017   AST 33 03/15/2017   NA 138 03/15/2017   K 4.8 03/15/2017   CL 103 03/15/2017   CREATININE 1.28 03/15/2017   BUN 14 03/15/2017   CO2 26 03/15/2017   TSH 0.71 07/26/2015   PSA 0.65 03/15/2017   HGBA1C 5.3 04/04/2016   MICROALBUR 4.8 (H) 11/19/2014    No results found.  Assessment & Plan:   Problem List Items Addressed This Visit    Hyperlipidemia    Now managed with pravastatin for low HDL mildly elevated LDL.  :FTS normal.  No changes today   Lab Results  Component Value Date   CHOL 170 03/15/2017   HDL 40.40 03/15/2017   LDLCALC 105 (H) 03/15/2017   LDLDIRECT 99 07/26/2015   TRIG 122.0 03/15/2017   CHOLHDL 4 03/15/2017   Lab Results  Component Value Date   ALT 42 03/15/2017   AST 33 03/15/2017   ALKPHOS 55 03/15/2017   BILITOT 0.3 03/15/2017           Relevant Orders   Lipid panel (Completed)   HYPERTENSION, BENIGN ESSENTIAL    Well controlled on current regimen. Renal function stable, no changes today.  Lab Results  Component Value Date   CREATININE 1.28 03/15/2017   Lab Results  Component Value Date   NA 138 03/15/2017   K 4.8 03/15/2017   CL 103 03/15/2017   CO2 26 03/15/2017         Relevant Orders   Comprehensive metabolic panel (Completed)   Obesity (BMI 30.0-34.9)    I have addressed  BMI and recommended wt loss of 10% of body  weight over the next 6 months using a low fat, fruit/vegetable based [place]iter[place] diet and regular exercise a minimum of 5 days per week.         Chronic kidney disease (CKD), stage II (mild)    Secondary to acute tubular necrosis with severe metabolic acidosis in November 2013 requiring admission to [facility]. creatinine was 11 on admission and 3 at discharge. He was referred to nephrology after discharge. CR has been stable   Lab Results  Component Value Date   CREATININE 1.28 03/15/2017   Lab Results  Component Value Date   NA 138 03/15/2017   K 4.8 03/15/2017   CL 103 03/15/2017   CO2 26 03/15/2017  Gout attack - Primary   Relevant Orders   Uric acid (Completed)   Routine adult health maintenance    Annual comprehensive preventive exam was done as well as an evaluation and management of acute and chronic conditions .  During the course of the visit the patient was educated and counseled about appropriate screening and preventive services including :  diabetes screening, lipid analysis with projected  10 year  risk for CAD , nutrition counseling, prostate and colorectal cancer screening, and recom mended immunizations.  Printed recommendations for health maintenance screenings was given.   Lab Results  Component Value Date   PSA 0.65 03/15/2017   PSA 0.61 11/19/2014   This SmartLink has not been configured with any valid records.   colonoscopy  Due in 2020 for 5 yr follow up       Impaired fasting glucose    a1c is normal.  Low GI diet recommended given obesity       Other Visit Diagnoses    Screening for prostate cancer       Relevant Orders   PSA, Medicare (Completed)     A total of 40 minutes was spent with patient more than half of which was spent in counseling patient on the above mentioned issues , reviewing and explaining recent labs and imaging studies done, and coordination of care.  I have discontinued [place] and predniSONE.  I am also having him start on Tdap. Additionally, I am having him maintain his amLODipine, lisinopril, elvitegravir-cobicistat-emtricitabine-tenofovir, darunavir, pravastatin, and ULORIC.  Meds ordered this encounter  Medications  . Tdap (BOOSTRIX) 5-2.5-18.5 LF-MCG/0.5 injection    Sig: Inject 0.5 mLs into the muscle once for 1 dose.    Dispense:  0.5 mL    Refill:  0    Medications Discontinued During This Encounter  Medication Reason  . GENVOYA 150-150-200-10 MG TABS tablet Duplicate  . predniSONE (DELTASONE) 10 MG tablet Completed Course    Follow-up: Return in about 6 months (around 09/12/2017).   [name], MD

## 2017-03-15 NOTE — Patient Instructions (Addendum)
[employer] carry  Zyrtec in the store  brand,  Called cetirizine .  You can get year's supply for $15 .  I'm glad you are  Focusing on weight loss!  Cutting out sugars is the thing to do!     3 low carb yogurts  That taste great  Dannon lt n Fit [place] [employer] version   [employer]     .

## 2017-03-17 NOTE — Assessment & Plan Note (Signed)
Well controlled on current regimen. Renal function stable, no changes today.  Lab Results  Component Value Date   CREATININE 1.28 03/15/2017   Lab Results  Component Value Date   NA 138 03/15/2017   K 4.8 03/15/2017   CL 103 03/15/2017   CO2 26 03/15/2017

## 2017-03-17 NOTE — Assessment & Plan Note (Addendum)
Annual comprehensive preventive exam was done as well as an evaluation and management of acute and chronic conditions .  During the course of the visit the patient was educated and counseled about appropriate screening and preventive services including :  diabetes screening, lipid analysis with projected  10 year  risk for CAD , nutrition counseling, prostate and colorectal cancer screening, and recom mended immunizations.  Printed recommendations for health maintenance screenings was given.   Lab Results  Component Value Date   PSA 0.65 03/15/2017   PSA 0.61 11/19/2014   This SmartLink has not been configured with any valid records.   colonoscopy  Due in 2020 for 5 yr follow up

## 2017-03-17 NOTE — Assessment & Plan Note (Signed)
a1c is normal.  Low GI diet recommended given obesity

## 2017-03-17 NOTE — Assessment & Plan Note (Signed)
Secondary to acute tubular necrosis with severe metabolic acidosis in November 2013 requiring admission to [facility]. creatinine was 11 on admission and 3 at discharge. He was referred to nephrology after discharge. CR has been stable   Lab Results  Component Value Date   CREATININE 1.28 03/15/2017   Lab Results  Component Value Date   NA 138 03/15/2017   K 4.8 03/15/2017   CL 103 03/15/2017   CO2 26 03/15/2017

## 2017-03-17 NOTE — Assessment & Plan Note (Signed)
Now managed with pravastatin for low HDL mildly elevated LDL.  :FTS normal.  No changes today   Lab Results  Component Value Date   CHOL 170 03/15/2017   HDL 40.40 03/15/2017   LDLCALC 105 (H) 03/15/2017   LDLDIRECT 99 07/26/2015   TRIG 122.0 03/15/2017   CHOLHDL 4 03/15/2017   Lab Results  Component Value Date   ALT 42 03/15/2017   AST 33 03/15/2017   ALKPHOS 55 03/15/2017   BILITOT 0.3 03/15/2017

## 2017-03-17 NOTE — Assessment & Plan Note (Signed)
I have addressed  BMI and recommended wt loss of 10% of body weight over the next 6 months using a low fat, fruit/vegetable based [place]iter[place] diet and regular exercise a minimum of 5 days per week.   

## 2017-03-19 ENCOUNTER — Encounter: Payer: Self-pay | Admitting: *Deleted

## 2017-06-02 ENCOUNTER — Other Ambulatory Visit: Payer: Self-pay | Admitting: Internal Medicine

## 2017-07-10 ENCOUNTER — Telehealth: Payer: Self-pay

## 2017-07-10 NOTE — Telephone Encounter (Signed)
Please advise 

## 2017-07-10 NOTE — Telephone Encounter (Signed)
Copied from [place] [id number]. Topic: General - Other >> Jul 10, 2017  1:08 PM Mcneil, Ja-Kwan wrote: Reason for CRM: Pt states he needs a Rx for Prednisone because gout has flared up in his right foot. Pt request Rx be sent to [id number] - [place], [place] - [address] [phone] (Phone) [phone] (Fax)

## 2017-07-27 ENCOUNTER — Ambulatory Visit: Payer: Medicare Other

## 2017-07-31 ENCOUNTER — Ambulatory Visit (INDEPENDENT_AMBULATORY_CARE_PROVIDER_SITE_OTHER): Payer: Medicare Other

## 2017-07-31 VITALS — BP 110/70 | HR 67 | Temp 98.2°F | Resp 14 | Ht 71.0 in | Wt 248.1 lb

## 2017-07-31 DIAGNOSIS — Z0000 Encounter for general adult medical examination without abnormal findings: Secondary | ICD-10-CM | POA: Diagnosis not present

## 2017-07-31 NOTE — Patient Instructions (Addendum)
  Adam Becker , Thank you for taking time to come for your Medicare Wellness Visit. I appreciate your ongoing commitment to your health goals. Please review the following plan we discussed and let me know if I can assist you in the future.   Schedule eye exam.  Schedule dental exam.  Follow up as needed.    Bring a copy of your [place] and/or Living Will to be scanned into chart once completed.   Have a great day!  These are the goals we discussed: Goals    . Low Carb Foods     Portion Control        This is a list of the screening recommended for you and due dates:  Health Maintenance  Topic Date Due  . Tetanus Vaccine  08/22/2017*  . Flu Shot  08/23/2017  . Colon Cancer Screening  11/08/2018  .  Hepatitis C: One time screening is recommended by Center for Disease Control  (CDC) for  adults born from [birth year] through 1964.   Completed  . HIV Screening  Completed  *Topic was postponed. The date shown is not the original due date.

## 2017-07-31 NOTE — Progress Notes (Signed)
Subjective:   Adam Becker is a 62 y.o. male who presents for Medicare Annual/Subsequent preventive examination.  Review of Systems:  No ROS.  Medicare Wellness Visit. Additional risk factors are reflected in the social history.  Cardiac Risk Factors include: advanced age (>35men, >8 women);male gender;hypertension;obesity (BMI >30kg/m2)     Objective:    Vitals: BP 110/70 (BP Location: Left Arm, Patient Position: Sitting, Cuff Size: Normal)   Pulse 67   Temp 98.2 F (36.8 C) (Oral)   Resp 14   Ht 5\' 11"  (1.803 m)   Wt 248 lb 1.9 oz (112.5 kg)   SpO2 95%   BMI 34.61 kg/m   Body mass index is 34.61 kg/m.  Advanced Directives 07/31/2017 07/25/2016 11/07/2013 10/24/2013  Does Patient Have a Medical Advance Directive? No No No No  Would patient like information on creating a medical advance directive? Yes (MAU/Ambulatory/Procedural Areas - Information given) No - Patient declined - -    Tobacco Social History   Tobacco Use  Smoking Status Never Smoker  Smokeless Tobacco Never Used     Counseling given: Not Answered   Clinical Intake:  Pre-visit preparation completed: Yes  Pain : No/denies pain     Nutritional Status: BMI > 30  Obese Diabetes: No  How often do you need to have someone help you when you read instructions, pamphlets, or other written materials from your doctor or pharmacy?: 1 - Never  Interpreter Needed?: No     Past Medical History:  Diagnosis Date  . 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 Chronic kidney disease Nov 2013   admission for ATN cr 11, acidosis  . HIV infection ([place])   . Hyperglycemia 04/04/2016  . Hyperlipidemia   . Hypertension   . Urethritis 02/11/2015   Past Surgical History:  Procedure Laterality Date  . COLONOSCOPY    . SPLENECTOMY  2000   reason unclear "stopped working"   Family History  Problem Relation Age of Onset  . Cancer Mother   . Breast cancer Mother   . Colon cancer Father   . Birth defects Son        Breast     Social History   Socioeconomic History  . Marital status: Single    Spouse name: Not on file  . Number of children: Not on file  . Years of education: Not on file  . Highest education level: Not on file  Occupational History  . Not on file  Social Needs  . Financial resource strain: Not hard at all  . Food insecurity:    Worry: Never true    Inability: Never true  . Transportation needs:    Medical: No    Non-medical: No  Tobacco Use  . Smoking status: Never Smoker  . Smokeless tobacco: Never Used  Substance and Sexual Activity  . Alcohol use: No  . Drug use: No  . Sexual activity: Not Currently    Comment: declined condoms  Lifestyle  . Physical activity:    Days per week: 7 days    Minutes per session: 30 min  . Stress: Not at all  Relationships  . Social connections:    Talks on phone: Not on file    Gets together: Not on file    Attends religious service: Not on file    Active member of club or organization: Not on file    Attends meetings of clubs or organizations: Not on file    Relationship status: Not on file  Other  Topics Concern  . Not on file  Social History Narrative  . Not on file    Outpatient Encounter Medications as of 07/31/2017  Medication Sig  . amLODipine (NORVASC) 5 MG tablet TAKE 1 TABLET (5 MG TOTAL) BY MOUTH DAILY.  Marland Kitchen darunavir (PREZISTA) 800 MG tablet Take 1 tablet (800 mg total) by mouth daily.  Marland Kitchen elvitegravir-cobicistat-emtricitabine-tenofovir (GENVOYA) 150-150-200-10 MG TABS tablet Take 1 tablet by mouth daily with breakfast.  . lisinopril (PRINIVIL,ZESTRIL) 20 MG tablet TAKE 1 TABLET (20 MG TOTAL) BY MOUTH DAILY.  . pravastatin (PRAVACHOL) 40 MG tablet Take 1 tablet (40 mg total) by mouth daily.  Marland Kitchen ULORIC 40 MG tablet TAKE 1 TABLET BY MOUTH EVERY DAY   No facility-administered encounter medications on file as of 07/31/2017.     Activities of Daily Living In your present state of health, do you have any difficulty performing the  following activities: 07/31/2017 10/10/2016  Hearing? N N  Vision? N N  Difficulty concentrating or making decisions? N N  Walking or climbing stairs? N N  Dressing or bathing? N N  Doing errands, shopping? N N  Preparing Food and eating ? N -  Using the Toilet? N -  In the past six months, have you accidently leaked urine? N -  Do you have problems with loss of bowel control? N -  Managing your Medications? N -  Managing your Finances? N -  Housekeeping or managing your Housekeeping? N -  Some recent data might be hidden    Patient Care Team: [name], MD as PCP - General (Internal Medicine) [name], [name], MD as PCP - Infectious Diseases (Infectious Diseases)   Assessment:   This is a routine wellness examination for Adam Becker.  The goal of the wellness visit is to assist the patient how to close the gaps in care and create a preventative care plan for the patient.   The roster of all physicians providing medical care to patient is listed in the Snapshot section of the chart.  Osteoporosis risk reviewed.    Safety issues reviewed; Smoke and carbon monoxide detectors in the home. No firearms in the home. Wears seatbelts when driving or riding with others. No violence in the home.  They do not have excessive sun exposure.  Discussed the need for sun protection: hats, long sleeves and the use of sunscreen if there is significant sun exposure.  Patient is alert, normal appearance, oriented to person/place/and time. Correctly identified the president of the [place] and recalls of 3/3 words.Performs simple calculations and can read correct time from watch face. Displays appropriate judgement.  No new identified risk were noted.  No failures at ADL's or IADL's.    BMI- discussed the importance of a healthy diet, water intake and the benefits of aerobic exercise. Educational material provided.   24 hour diet recall: Regular diet  Dental- UTD.  Sleep patterns- Sleeps  through the night without issues.    Health maintenance gaps- closed.  Patient Concerns: None at this time. Follow up with PCP as needed.  Exercise Activities and Dietary recommendations Current Exercise Habits: Structured exercise class, Type of exercise: walking;treadmill, Time (Minutes): > 60(1hr 30 minutes), Frequency (Times/Week): 7, Weekly Exercise (Minutes/Week): 0, Intensity: Moderate  Goals    . Low Carb Foods     Portion Control        Fall Risk Fall Risk  07/31/2017 10/10/2016 07/25/2016 04/04/2016 03/02/2016  Falls in the past year? No No No No No  Depression Screen PHQ 2/9 Scores 07/31/2017 10/10/2016 07/25/2016 04/04/2016  PHQ - 2 Score 0 0 0 0    Cognitive Function MMSE - Mini Mental State Exam 07/31/2017 07/25/2016  Orientation to time 5 5  Orientation to Place 5 5  Registration 3 3  Attention/ Calculation 5 5  Recall 3 3  Language- name 2 objects 2 2  Language- repeat 1 1  Language- follow 3 step command 3 3  Language- read & follow direction 1 1  Write a sentence 1 1  Copy design 1 1  Total score 30 30        Immunization History  Administered Date(s) Administered  . H1N1 01/02/2008  . Hepatitis A 12/31/2006, 07/15/2007  . Hepatitis B 03/26/2001, 05/06/2001, 10/14/2001  . Influenza Split 11/15/2010, 12/07/2011  . Influenza Whole 10/02/2005, 10/15/2006, 11/26/2008, 11/01/2009  . Influenza,inj,Quad PF,6+ Mos 10/07/2012, 11/05/2013, 11/11/2014, 10/12/2015, 10/10/2016  . Meningococcal Polysaccharide 10/12/2015  . Pneumococcal Conjugate-13 06/21/2011  . Pneumococcal Polysaccharide-23 09/23/2005, 10/02/2005, 02/29/2012   Screening Tests Health Maintenance  Topic Date Due  . TETANUS/TDAP  08/22/2017 (Originally 10/[birth year]/1975)  . INFLUENZA VACCINE  08/23/2017  . COLONOSCOPY  11/08/2018  . Hepatitis C Screening  Completed  . HIV Screening  Completed       Plan:    End of life planning; Advance aging; Advanced directives discussed. Copy of current HCPOA/Living  Will requested upon completion.    I have personally reviewed and noted the following in the patient's chart:   . Medical and social history . Use of alcohol, tobacco or illicit drugs  . Current medications and supplements . Functional ability and status . Nutritional status . Physical activity . Advanced directives . List of other physicians . Hospitalizations, surgeries, and ER visits in previous 12 months . Vitals . Screenings to include cognitive, depression, and falls . Referrals and appointments  In addition, I have reviewed and discussed with patient certain preventive protocols, quality metrics, and best practice recommendations. A written personalized care plan for preventive services as well as general preventive health recommendations were provided to patient.     OBrien-Blaney, Denisa L, LPN  03/31/9371   Reviewed above information.  Agree with assessment and plan.    Dr Nicki Reaper

## 2017-08-18 ENCOUNTER — Encounter: Payer: Self-pay | Admitting: Emergency Medicine

## 2017-08-18 ENCOUNTER — Observation Stay
Admission: EM | Admit: 2017-08-18 | Discharge: 2017-08-19 | Disposition: A | Discharge status: Home / Self Care | Payer: Medicare Other | Attending: Internal Medicine | Admitting: Internal Medicine

## 2017-08-18 ENCOUNTER — Emergency Department: Payer: Medicare Other

## 2017-08-18 ENCOUNTER — Other Ambulatory Visit: Payer: Self-pay

## 2017-08-18 DIAGNOSIS — I129 Hypertensive chronic kidney disease with stage 1 through stage 4 chronic kidney disease, or unspecified chronic kidney disease: Secondary | ICD-10-CM | POA: Insufficient documentation

## 2017-08-18 DIAGNOSIS — Z6833 Body mass index (BMI) 33.0-33.9, adult: Secondary | ICD-10-CM | POA: Insufficient documentation

## 2017-08-18 DIAGNOSIS — H8110 Benign paroxysmal vertigo, unspecified ear: Secondary | ICD-10-CM | POA: Diagnosis not present

## 2017-08-18 DIAGNOSIS — E785 Hyperlipidemia, unspecified: Secondary | ICD-10-CM | POA: Diagnosis not present

## 2017-08-18 DIAGNOSIS — G459 Transient cerebral ischemic attack, unspecified: Principal | ICD-10-CM | POA: Diagnosis present

## 2017-08-18 DIAGNOSIS — N183 Chronic kidney disease, stage 3 (moderate): Secondary | ICD-10-CM | POA: Diagnosis not present

## 2017-08-18 DIAGNOSIS — Z21 Asymptomatic human immunodeficiency virus [HIV] infection status: Secondary | ICD-10-CM | POA: Insufficient documentation

## 2017-08-18 DIAGNOSIS — R29818 Other symptoms and signs involving the nervous system: Secondary | ICD-10-CM | POA: Diagnosis not present

## 2017-08-18 DIAGNOSIS — Z79899 Other long term (current) drug therapy: Secondary | ICD-10-CM | POA: Diagnosis not present

## 2017-08-18 DIAGNOSIS — E669 Obesity, unspecified: Secondary | ICD-10-CM | POA: Insufficient documentation

## 2017-08-18 DIAGNOSIS — B20 Human immunodeficiency virus [HIV] disease: Secondary | ICD-10-CM | POA: Diagnosis not present

## 2017-08-18 DIAGNOSIS — Z800 Family history of malignant neoplasm of digestive organs: Secondary | ICD-10-CM | POA: Diagnosis not present

## 2017-08-18 DIAGNOSIS — R42 Dizziness and giddiness: Secondary | ICD-10-CM | POA: Diagnosis not present

## 2017-08-18 DIAGNOSIS — I10 Essential (primary) hypertension: Secondary | ICD-10-CM | POA: Diagnosis not present

## 2017-08-18 DIAGNOSIS — R110 Nausea: Secondary | ICD-10-CM | POA: Diagnosis not present

## 2017-08-18 HISTORY — DX: Transient cerebral ischemic attack, unspecified: G45.9

## 2017-08-18 LAB — CBC
HCT: 39.9 % — ABNORMAL LOW (ref 40.0–52.0)
Hemoglobin: 13.8 g/dL (ref 13.0–18.0)
MCH: 34.1 pg — ABNORMAL HIGH (ref 26.0–34.0)
MCHC: 34.7 g/dL (ref 32.0–36.0)
MCV: 98.4 fL (ref 80.0–100.0)
Platelets: 354 10*3/uL (ref 150–440)
RBC: 4.05 MIL/uL — ABNORMAL LOW (ref 4.40–5.90)
RDW: 13.9 % (ref 11.5–14.5)
WBC: 8.7 10*3/uL (ref 3.8–10.6)

## 2017-08-18 LAB — BASIC METABOLIC PANEL
Anion gap: 6 (ref 5–15)
BUN: 16 mg/dL (ref 6–20)
CO2: 28 mmol/L (ref 22–32)
Calcium: 9.2 mg/dL (ref 8.9–10.3)
Chloride: 108 mmol/L (ref 98–111)
Creatinine, Ser: 1.57 mg/dL — ABNORMAL HIGH (ref 0.61–1.24)
GFR calc Af Amer: 54 mL/min — ABNORMAL LOW (ref >60)
GFR calc non Af Amer: 46 mL/min — ABNORMAL LOW (ref >60)
Glucose, Bld: 108 mg/dL — ABNORMAL HIGH (ref 70–99)
Potassium: 4 mmol/L (ref 3.5–5.1)
Sodium: 142 mmol/L (ref 135–145)

## 2017-08-18 LAB — URINALYSIS, COMPLETE (UACMP) WITH MICROSCOPIC
Bacteria, UA: NONE SEEN
Bilirubin Urine: NEGATIVE
Glucose, UA: NEGATIVE mg/dL
Hgb urine dipstick: NEGATIVE
Ketones, ur: NEGATIVE mg/dL
Leukocytes, UA: NEGATIVE
Nitrite: NEGATIVE
Protein, ur: 30 mg/dL — AB
Specific Gravity, Urine: 1.017 (ref 1.005–1.030)
Squamous Epithelial / LPF: NONE SEEN (ref 0–5)
pH: 5 (ref 5.0–8.0)

## 2017-08-18 MED ORDER — MECLIZINE HCL 25 MG PO TABS
25.0000 mg | ORAL_TABLET | Freq: Three times a day (TID) | ORAL | Status: DC | PRN
  Filled 2017-08-18: qty 1

## 2017-08-18 MED ORDER — AMLODIPINE BESYLATE 5 MG PO TABS
5.0000 mg | ORAL_TABLET | Freq: Every day | ORAL | Status: DC
  Administered 2017-08-19: 10:00:00 5 mg via ORAL
  Filled 2017-08-18: qty 1

## 2017-08-18 MED ORDER — ASPIRIN 81 MG PO CHEW
81.0000 mg | CHEWABLE_TABLET | Freq: Every day | ORAL | Status: DC
  Administered 2017-08-18 – 2017-08-19 (×2): 81 mg via ORAL
  Filled 2017-08-18 (×2): qty 1

## 2017-08-18 MED ORDER — STROKE: EARLY STAGES OF RECOVERY BOOK
Freq: Once | Status: AC
  Administered 2017-08-18: 23:00:00

## 2017-08-18 MED ORDER — DARUNAVIR ETHANOLATE 800 MG PO TABS
800.0000 mg | ORAL_TABLET | Freq: Every day | ORAL | Status: DC
  Administered 2017-08-19: 10:00:00 800 mg via ORAL
  Filled 2017-08-18: qty 1

## 2017-08-18 MED ORDER — FEBUXOSTAT 40 MG PO TABS
40.0000 mg | ORAL_TABLET | Freq: Every day | ORAL | Status: DC
  Administered 2017-08-19: 10:00:00 40 mg via ORAL
  Filled 2017-08-18: qty 1

## 2017-08-18 MED ORDER — ELVITEG-COBIC-EMTRICIT-TENOFAF 150-150-200-10 MG PO TABS
1.0000 | ORAL_TABLET | Freq: Every day | ORAL | Status: DC
  Administered 2017-08-19: 10:00:00 1 via ORAL
  Filled 2017-08-18: qty 1

## 2017-08-18 MED ORDER — ONDANSETRON HCL 4 MG PO TABS
4.0000 mg | ORAL_TABLET | Freq: Four times a day (QID) | ORAL | Status: DC | PRN
Start: 2017-08-18 — End: 2017-08-19

## 2017-08-18 MED ORDER — ACETAMINOPHEN 650 MG RE SUPP: 650.0000 mg | Freq: Four times a day (QID) | RECTAL | Status: DC | PRN

## 2017-08-18 MED ORDER — ONDANSETRON HCL 4 MG/2ML IJ SOLN: 4.0000 mg | Freq: Four times a day (QID) | INTRAMUSCULAR | Status: DC | PRN

## 2017-08-18 MED ORDER — ACETAMINOPHEN 325 MG PO TABS: 650.0000 mg | ORAL_TABLET | Freq: Four times a day (QID) | ORAL | Status: DC | PRN

## 2017-08-18 MED ORDER — ENOXAPARIN SODIUM 40 MG/0.4ML ~~LOC~~ SOLN: 40.0000 mg | SUBCUTANEOUS | Status: DC

## 2017-08-18 MED ORDER — LISINOPRIL 20 MG PO TABS
20.0000 mg | ORAL_TABLET | Freq: Every day | ORAL | Status: DC
  Administered 2017-08-19: 20 mg via ORAL
  Filled 2017-08-18: qty 1

## 2017-08-18 MED ORDER — PRAVASTATIN SODIUM 20 MG PO TABS
40.0000 mg | ORAL_TABLET | Freq: Every day | ORAL | Status: DC
  Administered 2017-08-19: 10:00:00 40 mg via ORAL
  Filled 2017-08-18: qty 2

## 2017-08-18 NOTE — ED Notes (Signed)
Pt reports had episode of dizziness felt like the room was spinning a bit  He states the symptoms began several hours ago slight nausea  Pt reports symptoms better now  He is alert and oriented   He reports he had mild episode several days ago that lasted briefly

## 2017-08-18 NOTE — ED Notes (Signed)
Patient is on cardiac monitor.

## 2017-08-18 NOTE — ED Notes (Signed)
Dressing to l hand redressed

## 2017-08-18 NOTE — H&P (Signed)
[facility] [place] NAME: Adam Becker    MR#:  [id number]  DATE OF BIRTH:  1955/06/26  DATE OF ADMISSION:  08/18/2017  PRIMARY CARE PHYSICIAN: [name], MD   REQUESTING/REFERRING PHYSICIAN: Dr. Conni Slipper  CHIEF COMPLAINT:   Chief Complaint  Patient presents with  . Dizziness    HISTORY OF PRESENT ILLNESS:  Isacc Becker  is a 62 y.o. male with a known history of HIV infection, hypertension, CKD presents to hospital secondary to an episode of dizziness this afternoon. Patient is independent and ambulatory at baseline. This afternoon he came from a cookout and laid down. He closed his eyes and was trying to move his head and felt dizzy as if everything was spinning around him. When his eyes were open, he felt blurry for a few seconds. His symptoms resolved. But a similar episode happened couple days ago and so came to the emergency room. 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 Neurology recommended observation for possible TIA. Patient does not take any blood thinners at home.  PAST MEDICAL HISTORY:   Past Medical History:  Diagnosis Date  . 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 Chronic kidney disease Nov 2013   admission for ATN cr 11, acidosis  . HIV infection ([place])   . Hyperglycemia 04/04/2016  . Hyperlipidemia   . Hypertension   . Urethritis 02/11/2015    PAST SURGICAL HISTORY:   Past Surgical History:  Procedure Laterality Date  . COLONOSCOPY    . SPLENECTOMY  2000   reason unclear "stopped working"    SOCIAL HISTORY:   Social History   Tobacco Use  . Smoking status: Never Smoker  . Smokeless tobacco: Never Used  Substance Use Topics  . Alcohol use: No    FAMILY HISTORY:   Family History  Problem Relation Age of Onset  . Cancer Mother   . Breast cancer Mother   . Colon cancer Father   . Birth defects Son        Breast     DRUG ALLERGIES:  No Known  Allergies  REVIEW OF SYSTEMS:   Review of Systems  Constitutional: Negative for chills, fever, malaise/fatigue and weight loss.  HENT: Negative for ear discharge, ear pain, hearing loss, nosebleeds and tinnitus.   Eyes: Negative for blurred vision, double vision and photophobia.  Respiratory: Negative for cough, hemoptysis, shortness of breath and wheezing.   Cardiovascular: Negative for chest pain, palpitations, orthopnea and leg swelling.  Gastrointestinal: Negative for abdominal pain, constipation, diarrhea, heartburn, melena, nausea and vomiting.  Genitourinary: Negative for dysuria, frequency, hematuria and urgency.  Musculoskeletal: Negative for back pain, myalgias and neck pain.  Skin: Negative for rash.  Neurological: Positive for dizziness. Negative for tingling, tremors, sensory change, speech change, focal weakness and headaches.  Endo/Heme/Allergies: Does not bruise/bleed easily.  Psychiatric/Behavioral: Negative for depression.    MEDICATIONS AT HOME:   Prior to Admission medications   Medication Sig Start Date End Date Taking? Authorizing Provider  amLODipine (NORVASC) 5 MG tablet TAKE 1 TABLET (5 MG TOTAL) BY MOUTH DAILY. 09/09/15   [name], MD  darunavir (PREZISTA) 800 MG tablet Take 1 tablet (800 mg total) by mouth daily. 10/10/16   Truman Hayward, MD  elvitegravir-cobicistat-emtricitabine-tenofovir [facility]) 150-150-200-10 MG TABS tablet Take 1 tablet by mouth daily with breakfast. 10/10/16   [name], [name], MD  lisinopril (PRINIVIL,ZESTRIL) 20 MG tablet TAKE 1  TABLET (20 MG TOTAL) BY MOUTH DAILY. 09/04/16   [name], MD  pravastatin (PRAVACHOL) 40 MG tablet Take 1 tablet (40 mg total) by mouth daily. 11/17/16   Truman Hayward, MD  ULORIC 40 MG tablet TAKE 1 TABLET BY MOUTH EVERY DAY 06/04/17   [name], MD      VITAL SIGNS:  Blood pressure (!) 145/88, pulse 78, temperature 98.4 F (36.9 C), temperature source Oral, resp. rate 18,  height 5\' 11"  (1.803 m), weight 108.9 kg (240 lb), SpO2 96 %.  PHYSICAL EXAMINATION:   Physical Exam  GENERAL:  62 y.o.-year-old patient lying in the bed with no acute distress.  EYES: Pupils equal, round, reactive to light and accommodation. No scleral icterus. Extraocular muscles intact.  HEENT: Head atraumatic, normocephalic. Oropharynx and nasopharynx clear.  NECK:  Supple, no jugular venous distention. No thyroid enlargement, no tenderness.  LUNGS: Normal breath sounds bilaterally, no wheezing, rales,rhonchi or crepitation. No use of accessory muscles of respiration.  CARDIOVASCULAR: S1, S2 normal. No murmurs, rubs, or gallops.  ABDOMEN: Soft, nontender, nondistended. Bowel sounds present. No organomegaly or mass.  EXTREMITIES: No pedal edema, cyanosis, or clubbing.  NEUROLOGIC: Cranial nerves II through XII are intact. No nystagmus noted. Muscle strength 5/5 in all extremities. Sensation intact. Gait not checked.  2+ deep tendon reflexes on upper extremities and both lower extremities. PSYCHIATRIC: The patient is alert and oriented x 3.  SKIN: No obvious rash, lesion, or ulcer.   LABORATORY PANEL:   CBC Recent Labs  Lab 08/18/17 1626  WBC 8.7  HGB 13.8  HCT 39.9*  PLT 354   ------------------------------------------------------------------------------------------------------------------  Chemistries  Recent Labs  Lab 08/18/17 1626  NA 142  K 4.0  CL 108  CO2 28  GLUCOSE 108*  BUN 16  CREATININE 1.57*  CALCIUM 9.2   ------------------------------------------------------------------------------------------------------------------  Cardiac Enzymes No results for input(s): TROPONINI in the last 168 hours. ------------------------------------------------------------------------------------------------------------------  RADIOLOGY:  Mr Brain Wo Contrast  Result Date: 08/18/2017 CLINICAL DATA:  Focal neuro deficit, stroke suspected. Dizzy spells. EXAM: MRI HEAD  WITHOUT CONTRAST TECHNIQUE: Multiplanar, multiecho pulse sequences of the brain and surrounding structures were obtained without intravenous contrast. COMPARISON:  11/05/2007 head CT FINDINGS: Brain: No acute infarction, hemorrhage, hydrocephalus, extra-axial collection or mass lesion. 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 Few FLAIR hyperintense foci in the supratentorial white matter, commonly seen by the patient's age. Vascular: Major flow voids are preserved. Skull and upper cervical spine: No evidence of marrow lesion. Sinuses/Orbits: Negative.  No mastoid or middle ear opacification. IMPRESSION: 1. No acute finding. 2. 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 Electronically Signed   By: Monte Fantasia M.D.   On: 08/18/2017 18:42    EKG:   Orders placed or performed during the hospital encounter of 08/18/17  . ED EKG  . ED EKG    IMPRESSION AND PLAN:   Jhovani Griswold  is a 62 y.o. male with a known history of HIV infection, hypertension, CKD presents to hospital secondary to an episode of dizziness this afternoon.  1. Dizziness-either benign positional vertigo or TIA - admit under observation. Neuro checks - neurology consulted. Started on aspirin. Patient on Statin at home -PT/OT consults -carotid Doppler and echocardiogram have been ordered.  2. HIV- stable continue home meds, gets 6 monthly blood work.  3. HTN- lisinopril and norvasc  4. CKD stage 3- stable. Monitor  5. DVT Prophylaxis- lovenox    All the records are reviewed and  case discussed with ED provider. Management plans discussed with the patient, family and they are in agreement.  CODE STATUS: Full Code  TOTAL TIME TAKING CARE OF THIS PATIENT: 50 minutes.    Gladstone Lighter M.D on 08/18/2017 at 8:45 PM  Between 7am to 6pm - Pager - [phone]  After 6pm go to [email or web address] - password [email or web address] [facility]  Office  [phone]  CC: Primary care physician;  [name], MD

## 2017-08-18 NOTE — ED Provider Notes (Signed)
[facility] Emergency Department Provider Note   ____________________________________________   First MD Initiated Contact with Patient 08/18/17 1732     (approximate)  I have reviewed the triage vital signs and the nursing notes.   HISTORY  Chief Complaint Dizziness   HPI Adam Becker is a 62 y.o. male who told me that he has been doing well and does not really have any medical problems except for that he had some dizziness last night when he went to bed he turned his head in bed and felt like his head was still moving throughout the day today he still been having this dizziness and some nausea but no vomiting on the way here he had a few seconds of blurry vision.  Symptoms are now resolved.  Past Medical History:  Diagnosis Date  . 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 Chronic kidney disease Nov 2013   admission for ATN cr 11, acidosis  . HIV infection ([place])   . Hyperglycemia 04/04/2016  . Hyperlipidemia   . Hypertension   . Urethritis 02/11/2015    Patient Active Problem List   Diagnosis Date Noted  . Need for immunization against influenza 10/10/2016  . Routine screening for STI (sexually transmitted infection) 10/10/2016  . Impaired fasting glucose 07/27/2015  . Urethritis 02/11/2015  . 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 Routine adult health maintenance 04/23/2014  . Allergic rhinitis due to allergen 04/23/2014  . 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 Gout attack 09/20/2012  . Obesity (BMI 30.0-34.9) 12/09/2011  . Hyperlipidemia 11/01/2009  . NEUROSYPHILIS 03/11/2009  . PARESTHESIA, HANDS 06/18/2008  . LATE SYPHILIS, LATENT 10/30/2007  . ERECTILE DYSFUNCTION, MILD 12/31/2006  . HYPERTENSION, BENIGN ESSENTIAL 10/15/2006  . Human immunodeficiency virus (HIV) disease ([place]) 04/16/2006    Past Surgical History:  Procedure Laterality Date  . COLONOSCOPY    . SPLENECTOMY  2000   reason unclear  "stopped working"    Prior to Admission medications   Medication Sig Start Date End Date Taking? Authorizing Provider  amLODipine (NORVASC) 5 MG tablet TAKE 1 TABLET (5 MG TOTAL) BY MOUTH DAILY. 09/09/15   [name], MD  darunavir (PREZISTA) 800 MG tablet Take 1 tablet (800 mg total) by mouth daily. 10/10/16   Truman Hayward, MD  elvitegravir-cobicistat-emtricitabine-tenofovir [facility]) 150-150-200-10 MG TABS tablet Take 1 tablet by mouth daily with breakfast. 10/10/16   [name], [name], MD  lisinopril (PRINIVIL,ZESTRIL) 20 MG tablet TAKE 1 TABLET (20 MG TOTAL) BY MOUTH DAILY. 09/04/16   [name], MD  pravastatin (PRAVACHOL) 40 MG tablet Take 1 tablet (40 mg total) by mouth daily. 11/17/16   Truman Hayward, MD  ULORIC 40 MG tablet TAKE 1 TABLET BY MOUTH EVERY DAY 06/04/17   [name], MD    Allergies Patient has no known allergies.  Family History  Problem Relation Age of Onset  . Cancer Mother   . Breast cancer Mother   . Colon cancer Father   . Birth defects Son        Breast     Social History Social History   Tobacco Use  . Smoking status: Never Smoker  . Smokeless tobacco: Never Used  Substance Use Topics  . Alcohol use: No  . Drug use: No    Review of Systems  Constitutional: No fever/chills Eyes: No visual changes. ENT: No sore throat. Cardiovascular: Denies chest pain. Respiratory: Denies shortness of breath. Gastrointestinal: No  abdominal pain.  No nausea, no vomiting.  No diarrhea.  No constipation. Genitourinary: Negative for dysuria. Musculoskeletal: Negative for back pain. Skin: Negative for rash. Neurological: Negative for headaches, focal weakness  ____________________________________________   PHYSICAL EXAM:  VITAL SIGNS: ED Triage Vitals  Enc Vitals Group     BP 08/18/17 1615 (!) 149/92     Pulse Rate 08/18/17 1615 91     Resp 08/18/17 1615 18     Temp 08/18/17 1615 98.4 F (36.9 C)     Temp Source 08/18/17  1615 Oral     SpO2 08/18/17 1615 98 %     Weight 08/18/17 1616 240 lb (108.9 kg)     Height 08/18/17 1616 5\' 11"  (1.803 m)     Head Circumference --      Peak Flow --      Pain Score 08/18/17 1621 0     Pain Loc --      Pain Edu? --      Excl. in [place]? --     Constitutional: Alert and oriented. Well appearing and in no acute distress. Eyes: Conjunctivae are normal. PERRL. EOMI. no nystagmus fundi are normal Head: Atraumatic. Nose: No congestion/rhinnorhea. Mouth/Throat: Mucous membranes are moist.  Oropharynx non-erythematous. Neck: No stridor Cardiovascular: Normal rate, regular rhythm. Grossly normal heart sounds.  Good peripheral circulation. Respiratory: Normal respiratory effort.  No retractions. Lungs CTAB. Gastrointestinal: Soft and nontender. No distention. No abdominal bruits. No CVA tenderness. Musculoskeletal: No lower extremity tenderness nor edema.  No joint effusions. Neurologic:  Normal speech and language. No gross focal neurologic deficits are appreciated.  Cranial nerves II through XII are intact although visual fields were not checked cerebellar finger-nose rapid alternating movements and hands and heel-to-shin are all normal motor strength is 5 out of 5 throughout  Skin:  Skin is warm, dry and intact. No rash noted. Psychiatric: Mood and affect are normal. Speech and behavior are normal.  ____________________________________________   LABS (all labs ordered are listed, but only abnormal results are displayed)  Labs Reviewed  BASIC METABOLIC PANEL - Abnormal; Notable for the following components:      Result Value   Glucose, Bld 108 (*)    Creatinine, Ser 1.57 (*)    GFR calc non Af Amer 46 (*)    GFR calc Af Amer 54 (*)    All other components within normal limits  CBC - Abnormal; Notable for the following components:   RBC 4.05 (*)    HCT 39.9 (*)    MCH 34.1 (*)    All other components within normal limits  URINALYSIS, COMPLETE (UACMP) WITH MICROSCOPIC  - Abnormal; Notable for the following components:   Color, Urine YELLOW (*)    APPearance CLEAR (*)    Protein, ur 30 (*)    All other components within normal limits  CBG MONITORING, ED   ____________________________________________  EKG EKG read and interpreted by me shows normal sinus rhythm rate of 88 left axis computer is reading bifascicular block being right bundle branch block and left anterior hemiblock I agree there does not appear to be any acute ST-T changes.  ____________________________________________  RADIOLOGY  ED MD interpretation: MRI read by radiology is a cluster of remote right cerebellar infarct  Official radiology report(s): Mr Brain Wo Contrast  Result Date: 08/18/2017 CLINICAL DATA:  Focal neuro deficit, stroke suspected. Dizzy spells. EXAM: MRI HEAD WITHOUT CONTRAST TECHNIQUE: Multiplanar, multiecho pulse sequences of the brain and surrounding structures were obtained without intravenous contrast.  COMPARISON:  11/05/2007 head CT FINDINGS: Brain: No acute infarction, hemorrhage, hydrocephalus, extra-axial collection or mass lesion. 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 Few FLAIR hyperintense foci in the supratentorial white matter, commonly seen by the patient's age. Vascular: Major flow voids are preserved. Skull and upper cervical spine: No evidence of marrow lesion. Sinuses/Orbits: Negative.  No mastoid or middle ear opacification. IMPRESSION: 1. No acute finding. 2. 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 Electronically Signed   By: Monte Fantasia M.D.   On: 08/18/2017 18:42    ____________________________________________   PROCEDURES  Procedure(s) performed:   Procedures  Critical Care performed:   ____________________________________________   INITIAL IMPRESSION / ASSESSMENT AND PLAN / ED COURSE  Discussed with neurology this does appear to have been a TIA.  We will put the patient in the hospital and worked him  up.         ____________________________________________   FINAL CLINICAL IMPRESSION(S) / ED DIAGNOSES  Final diagnoses:  Vertigo  TIA (transient ischemic attack)     ED Discharge Orders    None       Note:  This document was prepared using Dragon voice recognition software and may include unintentional dictation errors.    Nena Polio, MD 08/18/17 2010

## 2017-08-18 NOTE — ED Notes (Signed)
Unable to call report at this time due to RN not being available. 

## 2017-08-18 NOTE — ED Triage Notes (Signed)
Pt to ED via POV stating that for the past few nights he has not been sleeping well and when he gets in the bed his head "feels funny". Pt states that when he turns his head it feels like his head is still moving. Pt states that throughout the day he has been having dizziness spells that get worse when he moves his head as well as nausea. Pt states that he has also had some blurred vision that started on his way here.   Pt denies unilateral numbness or weakness. No facial droop or slurred speech noted. Pt is in NAD at this time.

## 2017-08-18 NOTE — ED Notes (Addendum)
Patient was given ED sandwich tray and apple juice per Dr. Tressia Miners. Patient is tolerating eating and drinking without difficulty.

## 2017-08-18 NOTE — ED Notes (Signed)
To MRI with belongings in a bag

## 2017-08-19 ENCOUNTER — Observation Stay: Payer: Medicare Other

## 2017-08-19 ENCOUNTER — Observation Stay
Admit: 2017-08-19 | Discharge: 2017-08-19 | Disposition: A | Discharge status: Home / Self Care | Payer: Medicare Other | Attending: Internal Medicine | Admitting: Internal Medicine

## 2017-08-19 ENCOUNTER — Observation Stay: Admit: 2017-08-19 | Payer: Medicare Other

## 2017-08-19 DIAGNOSIS — B20 Human immunodeficiency virus [HIV] disease: Secondary | ICD-10-CM | POA: Diagnosis not present

## 2017-08-19 DIAGNOSIS — R42 Dizziness and giddiness: Secondary | ICD-10-CM

## 2017-08-19 DIAGNOSIS — I10 Essential (primary) hypertension: Secondary | ICD-10-CM | POA: Diagnosis not present

## 2017-08-19 DIAGNOSIS — G459 Transient cerebral ischemic attack, unspecified: Secondary | ICD-10-CM

## 2017-08-19 LAB — BASIC METABOLIC PANEL
Anion gap: 5 (ref 5–15)
BUN: 16 mg/dL (ref 6–20)
CO2: 27 mmol/L (ref 22–32)
Calcium: 8.7 mg/dL — ABNORMAL LOW (ref 8.9–10.3)
Chloride: 109 mmol/L (ref 98–111)
Creatinine, Ser: 1.28 mg/dL — ABNORMAL HIGH (ref 0.61–1.24)
GFR calc Af Amer: >60 mL/min (ref >60)
GFR calc non Af Amer: 59 mL/min — ABNORMAL LOW (ref >60)
Glucose, Bld: 115 mg/dL — ABNORMAL HIGH (ref 70–99)
Potassium: 4 mmol/L (ref 3.5–5.1)
Sodium: 141 mmol/L (ref 135–145)

## 2017-08-19 LAB — LIPID PANEL
Cholesterol: 175 mg/dL (ref 0–200)
HDL: 34 mg/dL — ABNORMAL LOW (ref >40)
LDL Cholesterol: 114 mg/dL — ABNORMAL HIGH (ref 0–99)
Total CHOL/HDL Ratio: 5.1 RATIO
Triglycerides: 134 mg/dL (ref <150)
VLDL: 27 mg/dL (ref 0–40)

## 2017-08-19 LAB — ECHOCARDIOGRAM COMPLETE
Height: 71 in
Weight: 3840 oz

## 2017-08-19 LAB — CBC
HCT: 38.5 % — ABNORMAL LOW (ref 40.0–52.0)
Hemoglobin: 13.6 g/dL (ref 13.0–18.0)
MCH: 34.6 pg — ABNORMAL HIGH (ref 26.0–34.0)
MCHC: 35.3 g/dL (ref 32.0–36.0)
MCV: 98 fL (ref 80.0–100.0)
Platelets: 334 10*3/uL (ref 150–440)
RBC: 3.93 MIL/uL — ABNORMAL LOW (ref 4.40–5.90)
RDW: 14.1 % (ref 11.5–14.5)
WBC: 8.2 10*3/uL (ref 3.8–10.6)

## 2017-08-19 LAB — HEMOGLOBIN A1C
Hgb A1c MFr Bld: 6.4 % — ABNORMAL HIGH (ref 4.8–5.6)
Mean Plasma Glucose: 136.98 mg/dL

## 2017-08-19 MED ORDER — MECLIZINE HCL 25 MG PO TABS: 25.0000 mg | ORAL_TABLET | Freq: Three times a day (TID) | ORAL | 0 refills | Status: DC | PRN

## 2017-08-19 MED ORDER — PERFLUTREN LIPID MICROSPHERE
1.0000 mL | INTRAVENOUS | Status: AC | PRN
  Administered 2017-08-19: 5 mL via INTRAVENOUS
  Filled 2017-08-19: qty 10

## 2017-08-19 NOTE — Progress Notes (Signed)
Discharge instructions along with home medications and follow up gone over with patient. He verbalized that he understood instructions. 1 prescription given to patient. IV and tele removed. Pt being discharged home on room air, no distress noted. Patient states he feels safe to drive himself home. Ammie Dalton, RN

## 2017-08-19 NOTE — Consult Note (Signed)
Reason for Consult: TIA Referring Physician: Dr. Bridgett Larsson   CC: vertigo   HPI: Adam Becker is an 62 y.o. male with a known history of HIV infection, hypertension, CKD presents to hospital secondary to an episode of dizziness this afternoon. Dizziness/vertigo described as positional and was worse with head movements.  Pt is currently back to baseline.   Past Medical History:  Diagnosis Date  . 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 Chronic kidney disease Nov 2013   admission for ATN cr 11, acidosis  . HIV infection ([place])   . Hyperglycemia 04/04/2016  . Hyperlipidemia   . Hypertension   . Urethritis 02/11/2015    Past Surgical History:  Procedure Laterality Date  . COLONOSCOPY    . SPLENECTOMY  2000   reason unclear "stopped working"    Family History  Problem Relation Age of Onset  . Cancer Mother   . Breast cancer Mother   . Colon cancer Father   . Birth defects Son        Breast     Social History:  reports that he has never smoked. He has never used smokeless tobacco. He reports that he does not drink alcohol or use drugs.  No Known Allergies  Medications: I have reviewed the patient's current medications.  ROS: History obtained from the patient  General ROS: negative for - chills, fatigue, fever, night sweats, weight gain or weight loss Psychological ROS: negative for - behavioral disorder, hallucinations, memory difficulties, mood swings or suicidal ideation Ophthalmic ROS: negative for - blurry vision, double vision, eye pain or loss of vision ENT ROS: negative for - epistaxis, nasal discharge, oral lesions, sore throat, tinnitus or vertigo Allergy and Immunology ROS: negative for - hives or itchy/watery eyes Hematological and Lymphatic ROS: negative for - bleeding problems, bruising or swollen lymph nodes Endocrine ROS: negative for - galactorrhea, hair pattern changes, polydipsia/polyuria or temperature intolerance Respiratory ROS: negative for - cough, hemoptysis, shortness  of breath or wheezing Cardiovascular ROS: negative for - chest pain, dyspnea on exertion, edema or irregular heartbeat Gastrointestinal ROS: negative for - abdominal pain, diarrhea, hematemesis, nausea/vomiting or stool incontinence Genito-Urinary ROS: negative for - dysuria, hematuria, incontinence or urinary frequency/urgency Musculoskeletal ROS: negative for - joint swelling or muscular weakness Neurological ROS: as noted in HPI Dermatological ROS: negative for rash and skin lesion changes  Physical Examination: Blood pressure (!) 140/95, pulse 67, temperature 98.1 F (36.7 C), temperature source Oral, resp. rate 18, height 5\' 11"  (1.803 m), weight 240 lb (108.9 kg), SpO2 97 %.       Neurological Examination   Mental Status: Alert, oriented, thought content appropriate.  Speech fluent without evidence of aphasia.  Able to follow 3 step commands without difficulty. Cranial Nerves: II: Discs flat bilaterally; Visual fields grossly normal, pupils equal, round, reactive to light and accommodation III,IV, VI: ptosis not present, extra-ocular motions intact bilaterally V,VII: smile symmetric, facial light touch sensation normal bilaterally VIII: hearing normal bilaterally IX,X: gag reflex present XI: bilateral shoulder shrug XII: midline tongue extension Motor: Right : Upper extremity   5/5    Left:     Upper extremity   5/5  Lower extremity   5/5     Lower extremity   5/5 Tone and bulk:normal tone throughout; no atrophy noted Sensory: Pinprick and light touch intact throughout, bilaterally Deep Tendon Reflexes: 2+ and symmetric throughout Plantars: Right: downgoing   Left: downgoing Cerebellar: normal finger-to-nose, normal rapid alternating movements and normal heel-to-shin test Gait: not tested  Laboratory Studies:   Basic Metabolic Panel: Recent Labs  Lab 08/18/17 1626 08/19/17 0345  NA 142 141  K 4.0 4.0  CL 108 109  CO2 28 27  GLUCOSE 108* 115*  BUN 16 16   CREATININE 1.57* 1.28*  CALCIUM 9.2 8.7*    Liver Function Tests: No results for input(s): AST, ALT, ALKPHOS, BILITOT, PROT, ALBUMIN in the last 168 hours. No results for input(s): LIPASE, AMYLASE in the last 168 hours. No results for input(s): AMMONIA in the last 168 hours.  CBC: Recent Labs  Lab 08/18/17 1626 08/19/17 0345  WBC 8.7 8.2  HGB 13.8 13.6  HCT 39.9* 38.5*  MCV 98.4 98.0  PLT 354 334    Cardiac Enzymes: No results for input(s): CKTOTAL, CKMB, CKMBINDEX, TROPONINI in the last 168 hours.  BNP: Invalid input(s): POCBNP  CBG: No results for input(s): GLUCAP in the last 168 hours.  Microbiology: Results for orders placed or performed in visit on 11/22/11  Stool Culture     Status: None   Collection Time: 11/22/11 11:41 AM  Result Value Ref Range Status   Organism ID, Bacteria No Salmonella,Shigella,Campylobacter,Yersinia,or  Final   Organism ID, Bacteria No E.coli [id number]:H7 isolated.  Final  Clostridium Difficile by PCR     Status: None   Collection Time: 11/22/11 11:41 AM  Result Value Ref Range Status   Toxigenic C. Difficile by PCR Not Detected Not Detected Final    Comment:   This assay detects the presence of Clostridium difficile DNA coding for toxin B (tcdB) by real-time polymerase chain reaction (PCR) amplification. This test was developed and its performance characteristics have been determined by [employer]. Performance characteristics refer to the analytical performance of the test. This test has not been cleared or approved by the [place] Food and Drug Administration. The FDA has determined that such clearance or approval is not necessary. This laboratory is certified under the [place] as qualified to perform high complexity clinical laboratory testing.    Coagulation Studies: No results for input(s): LABPROT, INR in the last 72 hours.  Urinalysis:  Recent Labs  Lab 08/18/17 1626   COLORURINE YELLOW*  LABSPEC 1.017  PHURINE 5.0  GLUCOSEU NEGATIVE  HGBUR NEGATIVE  BILIRUBINUR NEGATIVE  KETONESUR NEGATIVE  PROTEINUR 30*  NITRITE NEGATIVE  LEUKOCYTESUR NEGATIVE    Lipid Panel:     Component Value Date/Time   CHOL 175 08/19/2017 0345   TRIG 134 08/19/2017 0345   HDL 34 (L) 08/19/2017 0345   CHOLHDL 5.1 08/19/2017 0345   VLDL 27 08/19/2017 0345   LDLCALC 114 (H) 08/19/2017 0345    HgbA1C:  Lab Results  Component Value Date   HGBA1C 6.4 (H) 08/19/2017    Urine Drug Screen:  No results found for: LABOPIA, COCAINSCRNUR, LABBENZ, AMPHETMU, THCU, LABBARB  Alcohol Level: No results for input(s): ETH in the last 168 hours.  Other results: EKG: normal EKG, normal sinus rhythm, unchanged from previous tracings.  Imaging: Mr Brain Wo Contrast  Result Date: 08/18/2017 CLINICAL DATA:  Focal neuro deficit, stroke suspected. Dizzy spells. EXAM: MRI HEAD WITHOUT CONTRAST TECHNIQUE: Multiplanar, multiecho pulse sequences of the brain and surrounding structures were obtained without intravenous contrast. COMPARISON:  11/05/2007 head CT FINDINGS: Brain: No acute infarction, hemorrhage, hydrocephalus, extra-axial collection or mass lesion. 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 Few FLAIR hyperintense foci in the supratentorial white matter, commonly seen by the patient's age. Vascular: Major flow voids are preserved. Skull and  upper cervical spine: No evidence of marrow lesion. Sinuses/Orbits: Negative.  No mastoid or middle ear opacification. IMPRESSION: 1. No acute finding. 2. 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 Electronically Signed   By: Monte Fantasia M.D.   On: 08/18/2017 18:42   US Carotid Bilateral  Result Date: 08/19/2017 CLINICAL DATA:  TIA, hypertension, blurred vision, hyperlipidemia and diabetes. EXAM: BILATERAL CAROTID DUPLEX ULTRASOUND TECHNIQUE: Pearline Cables scale imaging, color Doppler and duplex ultrasound were performed of bilateral  carotid and vertebral arteries in the neck. COMPARISON:  None. FINDINGS: Criteria: Quantification of carotid stenosis is based on velocity parameters that correlate the residual internal carotid diameter with NASCET-based stenosis levels, using the diameter of the distal internal carotid lumen as the denominator for stenosis measurement. The following velocity measurements were obtained: RIGHT ICA:  74/39 cm/sec CCA:  91/69 cm/sec SYSTOLIC ICA/CCA RATIO:  0.8 ECA:  54 cm/sec LEFT ICA:  107/52 cm/sec CCA:  45/03 cm/sec SYSTOLIC ICA/CCA RATIO:  1.3 ECA:  93 cm/sec RIGHT CAROTID ARTERY: Minimal plaque present at the level of the carotid bulb. No evidence of ICA plaque or stenosis. The right ICA is tortuous. RIGHT VERTEBRAL ARTERY: Antegrade flow with normal waveform and velocity. LEFT CAROTID ARTERY: No focal plaque identified. No evidence of carotid stenosis. The left ICA is tortuous. LEFT VERTEBRAL ARTERY: Antegrade flow with normal waveform and velocity. IMPRESSION: No evidence of internal carotid artery stenosis or plaque bilaterally. Minimal plaque present at the level of the left carotid bulb. Electronically Signed   By: Aletta Edouard M.D.   On: 08/19/2017 08:22     Assessment/Plan:  62 y.o. male with a known history of HIV infection, hypertension, CKD presents to hospital secondary to an episode of dizziness this afternoon. Dizziness/vertigo described as positional and was worse with head movements.  Pt is currently back to baseline.   Vertigo is positional not stroke related MRI brain chronic old infarcts D/c planning today Back to baseline.    08/19/2017, 12:54 PM

## 2017-08-19 NOTE — Progress Notes (Signed)
PT Cancellation Note  Patient Details Name: Adam Becker MRN: [id number] DOB: Dec 14, 1955   Cancelled Treatment:    Reason Eval/Treat Not Completed: Other (comment).  Pt feels he is getting around fine.  Conveyed to nursing to assist dc planning.  Will reattempt tomorrow if pt is still here.   Ramond Dial 08/19/2017, 10:03 AM   Mee Hives, PT MS Acute Rehab Dept. Number: [place] [facility] [place]

## 2017-08-19 NOTE — Care Management Obs Status (Signed)
[place] NOTIFICATION   Patient Details  Name: Adam Becker MRN: [id number] Date of Birth: [birth year]   Medicare Observation Status Notification Given:  Yes    Shikara Mcauliffe A Grey Schlauch, RN 08/19/2017, 1:29 PM

## 2017-08-19 NOTE — Care Management Obs Status (Deleted)
[place] NOTIFICATION   Patient Details  Name: SHUAN STATZER MRN: [id number] Date of Birth: 17-Jul-1955   Medicare Observation Status Notification Given:       Latanya Maudlin, RN 08/19/2017, 1:23 PM

## 2017-08-19 NOTE — Care Management Obs Status (Signed)
[place] NOTIFICATION   Patient Details  Name: BALLARD BUDNEY MRN: [id number] Date of Birth: 1955-11-21   Medicare Observation Status Notification Given:       Latanya Maudlin, RN 08/19/2017, 1:27 PM

## 2017-08-21 NOTE — Discharge Summary (Signed)
[facility] NAME: Adam Becker    MR#:  [id number]  DATE OF BIRTH:  1955/10/07  DATE OF ADMISSION:  08/18/2017 ADMITTING PHYSICIAN: Gladstone Lighter, MD  DATE OF DISCHARGE: 08/19/2017  2:10 PM  PRIMARY CARE PHYSICIAN: [name], MD    ADMISSION DIAGNOSIS:  TIA (transient ischemic attack) [G45.9] Vertigo [R42]  DISCHARGE DIAGNOSIS:  Active Problems:   TIA (transient ischemic attack)   SECONDARY DIAGNOSIS:   Past Medical History:  Diagnosis Date  . 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 Chronic kidney disease Nov 2013   admission for ATN cr 11, acidosis  . HIV infection ([place])   . Hyperglycemia 04/04/2016  . Hyperlipidemia   . Hypertension   . Urethritis 02/11/2015    HOSPITAL COURSE:   1. Dizziness- BPPV.   Stroke work up is negative.   Neurologist saw, suggest meclizine, pt felt much better.  2. HIV- stable continue home meds, gets 6 monthly blood work.  3. HTN- lisinopril and norvasc  4. CKD stage 3- stable. Monitor  5. DVT Prophylaxis- lovenox   DISCHARGE CONDITIONS:   Stable.  CONSULTS OBTAINED:  Treatment Team:  Leotis Pain, MD  DRUG ALLERGIES:  No Known Allergies  DISCHARGE MEDICATIONS:   Allergies as of 08/19/2017   No Known Allergies     Medication List    TAKE these medications   amLODipine 5 MG tablet Commonly known as:  NORVASC TAKE 1 TABLET (5 MG TOTAL) BY MOUTH DAILY.   darunavir 800 MG tablet Commonly known as:  PREZISTA Take 1 tablet (800 mg total) by mouth daily.   elvitegravir-cobicistat-emtricitabine-tenofovir 150-150-200-10 MG Tabs tablet Commonly known as:  GENVOYA Take 1 tablet by mouth daily with breakfast.   lisinopril 20 MG tablet Commonly known as:  PRINIVIL,ZESTRIL TAKE 1 TABLET (20 MG TOTAL) BY MOUTH DAILY.   meclizine 25 MG tablet Commonly known as:  ANTIVERT Take 1 tablet (25 mg total) by mouth 3 (three) times daily as needed for dizziness.    pravastatin 40 MG tablet Commonly known as:  PRAVACHOL Take 1 tablet (40 mg total) by mouth daily.   ULORIC 40 MG tablet Generic drug:  febuxostat TAKE 1 TABLET BY MOUTH EVERY DAY        DISCHARGE INSTRUCTIONS:    Follow with PMD in 1-2 weeks.  If you experience worsening of your admission symptoms, develop shortness of breath, life threatening emergency, suicidal or homicidal thoughts you must seek medical attention immediately by calling 911 or calling your MD immediately  if symptoms less severe.  You Must read complete instructions/literature along with all the possible adverse reactions/side effects for all the Medicines you take and that have been prescribed to you. Take any new Medicines after you have completely understood and accept all the possible adverse reactions/side effects.   Please note  You were cared for by a hospitalist during your hospital stay. If you have any questions about your discharge medications or the care you received while you were in the hospital after you are discharged, you can call the unit and asked to speak with the hospitalist on call if the hospitalist that took care of you is not available. Once you are discharged, your primary care physician will handle any further medical issues. Please note that NO REFILLS for any discharge medications will be authorized once you are discharged, as it is imperative that you return to your primary care physician (or establish a relationship with a primary care  physician if you do not have one) for your aftercare needs so that they can reassess your need for medications and monitor your lab values.    Today   CHIEF COMPLAINT:   Chief Complaint  Patient presents with  . Dizziness    HISTORY OF PRESENT ILLNESS:  Adam Becker  is a 62 y.o. male with a known history of hypertension, CKD presents to hospital secondary to an episode of dizziness this afternoon. Patient is independent and ambulatory at  baseline. This afternoon he came from a cookout and laid down. He closed his eyes and was trying to move his head and felt dizzy as if everything was spinning around him. When his eyes were open, he felt blurry for a few seconds. His symptoms resolved. But a similar episode happened couple days ago and so came to the emergency room. 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 Neurology recommended observation for possible TIA. Patient does not take any blood thinners at home.      VITAL SIGNS:  Blood pressure (!) 140/95, pulse 67, temperature 98.1 F (36.7 C), temperature source Oral, resp. rate 18, height 5\' 11"  (1.803 m), weight 108.9 kg (240 lb), SpO2 97 %.  I/O:  No intake or output data in the 24 hours ending 08/21/17 0040  PHYSICAL EXAMINATION:  GENERAL:  62 y.o.-year-old patient lying in the bed with no acute distress.  EYES: Pupils equal, round, reactive to light and accommodation. No scleral icterus. Extraocular muscles intact.  HEENT: Head atraumatic, normocephalic. Oropharynx and nasopharynx clear.  NECK:  Supple, no jugular venous distention. No thyroid enlargement, no tenderness.  LUNGS: Normal breath sounds bilaterally, no wheezing, rales,rhonchi or crepitation. No use of accessory muscles of respiration.  CARDIOVASCULAR: S1, S2 normal. No murmurs, rubs, or gallops.  ABDOMEN: Soft, non-tender, non-distended. Bowel sounds present. No organomegaly or mass.  EXTREMITIES: No pedal edema, cyanosis, or clubbing.  NEUROLOGIC: Cranial nerves II through XII are intact. Muscle strength 5/5 in all extremities. Sensation intact. Gait not checked.  PSYCHIATRIC: The patient is alert and oriented x 3.  SKIN: No obvious rash, lesion, or ulcer.   DATA REVIEW:   CBC Recent Labs  Lab 08/19/17 0345  WBC 8.2  HGB 13.6  HCT 38.5*  PLT 334    Chemistries  Recent Labs  Lab 08/19/17 0345  NA 141  K 4.0  CL 109  CO2 27  GLUCOSE 115*  BUN 16   CREATININE 1.28*  CALCIUM 8.7*    Cardiac Enzymes No results for input(s): TROPONINI in the last 168 hours.  Microbiology Results  Results for orders placed or performed in visit on 11/22/11  Stool Culture     Status: None   Collection Time: 11/22/11 11:41 AM  Result Value Ref Range Status   Organism ID, Bacteria No Salmonella,Shigella,Campylobacter,Yersinia,or  Final   Organism ID, Bacteria No E.coli 0157:H7 isolated.  Final  Clostridium Difficile by PCR     Status: None   Collection Time: 11/22/11 11:41 AM  Result Value Ref Range Status   Toxigenic C. Difficile by PCR Not Detected Not Detected Final    Comment:   This assay detects the presence of Clostridium difficile DNA coding for toxin B (tcdB) by real-time polymerase chain reaction (PCR) amplification. This test was developed and its performance characteristics have been determined by [employer]. Performance characteristics refer to the analytical performance of the test. This test has not been cleared or approved by  the [place] Food and Drug Administration. The FDA has determined that such clearance or approval is not necessary. This laboratory is certified under the [place] as qualified to perform high complexity clinical laboratory testing.    RADIOLOGY:  US Carotid Bilateral  Result Date: 08/19/2017 CLINICAL DATA:  TIA, hypertension, blurred vision, hyperlipidemia and diabetes. EXAM: BILATERAL CAROTID DUPLEX ULTRASOUND TECHNIQUE: Pearline Cables scale imaging, color Doppler and duplex ultrasound were performed of bilateral carotid and vertebral arteries in the neck. COMPARISON:  None. FINDINGS: Criteria: Quantification of carotid stenosis is based on velocity parameters that correlate the residual internal carotid diameter with NASCET-based stenosis levels, using the diameter of the distal internal carotid lumen as the denominator for stenosis measurement. The following velocity  measurements were obtained: RIGHT ICA:  74/39 cm/sec CCA:  54/98 cm/sec SYSTOLIC ICA/CCA RATIO:  0.8 ECA:  54 cm/sec LEFT ICA:  107/52 cm/sec CCA:  26/41 cm/sec SYSTOLIC ICA/CCA RATIO:  1.3 ECA:  93 cm/sec RIGHT CAROTID ARTERY: Minimal plaque present at the level of the carotid bulb. No evidence of ICA plaque or stenosis. The right ICA is tortuous. RIGHT VERTEBRAL ARTERY: Antegrade flow with normal waveform and velocity. LEFT CAROTID ARTERY: No focal plaque identified. No evidence of carotid stenosis. The left ICA is tortuous. LEFT VERTEBRAL ARTERY: Antegrade flow with normal waveform and velocity. IMPRESSION: No evidence of internal carotid artery stenosis or plaque bilaterally. Minimal plaque present at the level of the left carotid bulb. Electronically Signed   By: Aletta Edouard M.D.   On: 08/19/2017 08:22    EKG:   Orders placed or performed during the hospital encounter of 08/18/17  . ED EKG  . ED EKG  . EKG      Management plans discussed with the patient, family and they are in agreement.  CODE STATUS:  Code Status History    Date Active Date Inactive Code Status Order ID Comments User Context   08/18/2017 2223 08/19/2017 1730 Full Code [id number]  Gladstone Lighter, MD Inpatient      TOTAL TIME TAKING CARE OF THIS PATIENT: 35 minutes.    Vaughan Basta M.D on 08/21/2017 at 12:40 AM  Between 7am to 6pm - Pager - [phone]  After 6pm go to [email or web address] - password [email or web address] [facility]  Office  [phone]  CC: Primary care physician; [name], MD   Note: This dictation was prepared with Dragon dictation along with smaller phrase technology. Any transcriptional errors that result from this process are unintentional.

## 2017-08-30 ENCOUNTER — Other Ambulatory Visit: Payer: Self-pay | Admitting: Internal Medicine

## 2017-09-04 ENCOUNTER — Other Ambulatory Visit: Payer: Self-pay | Admitting: Internal Medicine

## 2017-09-10 ENCOUNTER — Ambulatory Visit: Payer: Self-pay | Admitting: *Deleted

## 2017-09-10 NOTE — Telephone Encounter (Signed)
Pt reports hospitalized overnight 08/18/17 for "Vertigo." States MRI, carotid studies "All negative."  States still has "Lightheadedness." Intermittent, mild, positional. RX Antivert PRN which he takes "At least once a day,"  Effective, but  "Wondering why I still have this lightheadedness."   Pt denies any SOB, CP. States is staying hydrated, no congestion, earache, sinus tenderness. Also reports BP trending up; 148/90 yesterday am after taking medications.  Pt has not had a F/U hospitalization appt.. Dr. Derrel Nip unavailable through week; appt made with L. Guse for tomorrow am.  Attempted to reach [facility] to clarify appropriate appt; unable to reach by phone, skyped. Care advise given to pt per protocol. Verbalizes understanding.  Reason for Disposition . [1] MODERATE dizziness (e.g., interferes with normal activities) AND [2] has been evaluated by physician for this  Answer Assessment - Initial Assessment Questions 1. DESCRIPTION: "Describe your dizziness."     Lightheadedness 2. LIGHTHEADED: "Do you feel lightheaded?" (e.g., somewhat faint, woozy, weak upon standing)     yes 3. VERTIGO: "Do you feel like either you or the room is spinning or tilting?" (i.e. vertigo)     no 4. SEVERITY: "How bad is it?"  "Do you feel like you are going to faint?" "Can you stand and walk?"   - MILD - walking normally   - MODERATE - interferes with normal activities (e.g., work, school)    - SEVERE - unable to stand, requires support to walk, feels like passing out now.      moderate 5. ONSET:  "When did the dizziness begin?"     08/18/17 seen in ED 6. AGGRAVATING FACTORS: "Does anything make it worse?" (e.g., standing, change in head position)     Positional 7. HEART RATE: "Can you tell me your heart rate?" "How many beats in 15 seconds?"  (Note: not all patients can do this) no      8. CAUSE: "What do you think is causing the dizziness?"     Unsure 9. RECURRENT SYMPTOM: "Have you had dizziness before?" If so, ask:  "When was the last time?" "What happened that time?"     July 2019 10. OTHER SYMPTOMS: "Do you have any other symptoms?" (e.g., fever, chest pain, vomiting, diarrhea, bleeding)       no  Protocols used: DIZZINESS [facility]

## 2017-09-11 ENCOUNTER — Encounter: Payer: Self-pay | Admitting: Family Medicine

## 2017-09-11 ENCOUNTER — Ambulatory Visit (INDEPENDENT_AMBULATORY_CARE_PROVIDER_SITE_OTHER): Payer: Medicare Other | Admitting: Family Medicine

## 2017-09-11 ENCOUNTER — Telehealth: Payer: Self-pay | Admitting: Family Medicine

## 2017-09-11 VITALS — BP 138/98 | HR 66 | Temp 97.8°F | Ht 71.0 in | Wt 247.0 lb

## 2017-09-11 DIAGNOSIS — G459 Transient cerebral ischemic attack, unspecified: Secondary | ICD-10-CM

## 2017-09-11 DIAGNOSIS — R5383 Other fatigue: Secondary | ICD-10-CM | POA: Diagnosis not present

## 2017-09-11 DIAGNOSIS — R42 Dizziness and giddiness: Secondary | ICD-10-CM | POA: Diagnosis not present

## 2017-09-11 LAB — CBC
HCT: 41.1 % (ref 39.0–52.0)
Hemoglobin: 14 g/dL (ref 13.0–17.0)
MCHC: 34.1 g/dL (ref 30.0–36.0)
MCV: 99.1 fl (ref 78.0–100.0)
Platelets: 367 10*3/uL (ref 150.0–400.0)
RBC: 4.15 Mil/uL — ABNORMAL LOW (ref 4.22–5.81)
RDW: 14.2 % (ref 11.5–15.5)
WBC: 6.1 10*3/uL (ref 4.0–10.5)

## 2017-09-11 LAB — BASIC METABOLIC PANEL
BUN: 18 mg/dL (ref 6–23)
CO2: 28 mEq/L (ref 19–32)
Calcium: 9.9 mg/dL (ref 8.4–10.5)
Chloride: 103 mEq/L (ref 96–112)
Creatinine, Ser: 1.45 mg/dL (ref 0.40–1.50)
GFR: 63.64 mL/min (ref >60.00)
Glucose, Bld: 139 mg/dL — ABNORMAL HIGH (ref 70–99)
Potassium: 4.4 mEq/L (ref 3.5–5.1)
Sodium: 138 mEq/L (ref 135–145)

## 2017-09-11 LAB — TSH: TSH: 1.04 u[IU]/mL (ref 0.35–4.50)

## 2017-09-11 MED ORDER — ASPIRIN EC 81 MG PO TBEC: 81.0000 mg | DELAYED_RELEASE_TABLET | Freq: Every day | ORAL | 3 refills | Status: DC

## 2017-09-11 NOTE — Telephone Encounter (Signed)
Called Pt to give him lab results, also notified the pt that he should start taking a 81 mg Asprin daily and the prescription for the Asprin was already sent to the pharmacy. The Pt said ok he understood and had no questions.

## 2017-09-11 NOTE — Telephone Encounter (Signed)
Please call patient to let him know that his CBC, electrolytes and kidney functions are all stable.  I reviewed his chart further and also discussed case with Dr. Derrel Nip, we agreed that he should be taking low-dose aspirin daily.  I have sent in a prescription for aspirin 81 mg, take 1 tablet daily.

## 2017-09-11 NOTE — Patient Instructions (Signed)
Great to meet you! 

## 2017-09-11 NOTE — Progress Notes (Signed)
Subjective:    Patient ID: Adam Becker, male    DOB: [birth year], 62 y.o.   MRN: [id number]  HPI   Patient presents to clinic for hospital follow up. He was admitted 7/27 to 08/19/2017 for vertigo and TIA.   Hospital H&P copied for inpatient chart:   HISTORY OF PRESENT ILLNESS:  Adam Becker  is a 62 y.o. male with a known history of HIV infection, hypertension, CKD presents to hospital secondary to an episode of dizziness this afternoon. Patient is independent and ambulatory at baseline. This afternoon he came from a cookout and laid down. He closed his eyes and was trying to move his head and felt dizzy as if everything was spinning around him. When his eyes were open, he felt blurry for a few seconds. His symptoms resolved. But a similar episode happened couple days ago and so came to the emergency room. 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 Neurology recommended observation for possible TIA. Patient does not take any blood thinners at home. IMPRESSION AND PLAN:   Adam Becker  is a 62 y.o. male with a known history of HIV infection, hypertension, CKD presents to hospital secondary to an episode of dizziness this afternoon.  1. Dizziness-either benign positional vertigo or TIA - admit under observation. Neuro checks - neurology consulted. Started on aspirin. Patient on Statin at home -PT/OT consults -carotid Doppler and echocardiogram have been ordered.  2. HIV- stable continue home meds, gets 6 monthly blood work.  3. HTN- lisinopril and norvasc  4. CKD stage 3- stable. Monitor  5. DVT Prophylaxis- lovenox   MRI Brain done 08/18/17 FINDINGS: Brain: No acute infarction, hemorrhage, hydrocephalus, extra-axial collection or mass lesion.  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 Few FLAIR hyperintense foci in the supratentorial white matter, commonly seen by the patient's age.  Vascular: Major flow  voids are preserved.  Skull and upper cervical spine: No evidence of marrow lesion.  Sinuses/Orbits: Negative.  No mastoid or middle ear opacification.  IMPRESSION: 1. No acute finding. 2. 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   Carotid Doppler done 08/19/17 FINDINGS: Criteria: Quantification of carotid stenosis is based on velocity parameters that correlate the residual internal carotid diameter with NASCET-based stenosis levels, using the diameter of the distal internal carotid lumen as the denominator for stenosis measurement.  The following velocity measurements were obtained:  RIGHT  ICA:  74/39 cm/sec  CCA:  54/27 cm/sec  SYSTOLIC ICA/CCA RATIO:  0.8  ECA:  54 cm/sec  LEFT  ICA:  107/52 cm/sec  CCA:  06/23 cm/sec  SYSTOLIC ICA/CCA RATIO:  1.3  ECA:  93 cm/sec  RIGHT CAROTID ARTERY: Minimal plaque present at the level of the carotid bulb. No evidence of ICA plaque or stenosis. The right ICA is tortuous.  RIGHT VERTEBRAL ARTERY: Antegrade flow with normal waveform and velocity.  LEFT CAROTID ARTERY: No focal plaque identified. No evidence of carotid stenosis. The left ICA is tortuous.  LEFT VERTEBRAL ARTERY: Antegrade flow with normal waveform and velocity.  IMPRESSION: No evidence of internal carotid artery stenosis or plaque bilaterally. Minimal plaque present at the level of the left carotid Bulb.   Echocardiogram done 08/19/17 Study Conclusions  - Left ventricle: Wall thickness was increased in a pattern of mild   LVH. Systolic function was normal. The estimated ejection   fraction was in the range of 50% to 55%. - Aortic valve: There was mild regurgitation.  Valve area (VTI):   2.71 cm^2. Valve area (Vmax): 2.47 cm^2. Valve area (Vmean): 2.58   cm^2. - Left atrium: The atrium was mildly dilated.   Discharge summary copied from inpatient chart:  1. Dizziness- BPPV.   Stroke work up is negative.    Neurologist saw, suggest meclizine, pt felt much better.  2. HIV- stable continue home meds, gets 6 monthly blood work.  3. HTN- lisinopril and norvasc  4. CKD stage 3- stable. Monitor  5. DVT Prophylaxis- lovenox   DISCHARGE CONDITIONS:   Stable.  CONSULTS OBTAINED:  Treatment Team:  Adam Pain, MD  Discharge medication list: Medication List    TAKE these medications   amLODipine 5 MG tablet Commonly known as:  NORVASC TAKE 1 TABLET (5 MG TOTAL) BY MOUTH DAILY.   darunavir 800 MG tablet Commonly known as:  PREZISTA Take 1 tablet (800 mg total) by mouth daily.   elvitegravir-cobicistat-emtricitabine-tenofovir 150-150-200-10 MG Tabs tablet Commonly known as:  GENVOYA Take 1 tablet by mouth daily with breakfast.   lisinopril 20 MG tablet Commonly known as:  PRINIVIL,ZESTRIL TAKE 1 TABLET (20 MG TOTAL) BY MOUTH DAILY.   meclizine 25 MG tablet Commonly known as:  ANTIVERT Take 1 tablet (25 mg total) by mouth 3 (three) times daily as needed for dizziness.   pravastatin 40 MG tablet Commonly known as:  PRAVACHOL Take 1 tablet (40 mg total) by mouth daily.   ULORIC 40 MG tablet Generic drug:  febuxostat TAKE 1 TABLET BY MOUTH EVERY DAY     Patient Active Problem List   Diagnosis Date Noted  . TIA (transient ischemic attack) 08/18/2017  . Need for immunization against influenza 10/10/2016  . Routine screening for STI (sexually transmitted infection) 10/10/2016  . Impaired fasting glucose 07/27/2015  . Urethritis 02/11/2015  . 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 Routine adult health maintenance 04/23/2014  . Allergic rhinitis due to allergen 04/23/2014  . 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 Gout attack 09/20/2012  . Obesity (BMI 30.0-34.9) 12/09/2011  . Hyperlipidemia 11/01/2009  . NEUROSYPHILIS 03/11/2009  . PARESTHESIA, HANDS 06/18/2008  . LATE SYPHILIS, LATENT 10/30/2007  . ERECTILE  DYSFUNCTION, MILD 12/31/2006  . HYPERTENSION, BENIGN ESSENTIAL 10/15/2006  . Human immunodeficiency virus (HIV) disease ([place]) 04/16/2006   Social History   Tobacco Use  . Smoking status: Never Smoker  . Smokeless tobacco: Never Used  Substance Use Topics  . Alcohol use: No   Review of Systems  Constitutional: Negative for chills, fatigue and fever.  HENT: Negative for congestion, ear Becker, sinus Becker and sore throat.   Eyes: Negative.   Respiratory: Negative for cough, shortness of breath and wheezing.   Cardiovascular: Negative for chest Becker, palpitations and leg swelling.  Gastrointestinal: Negative for abdominal Becker, diarrhea, nausea and vomiting.  Genitourinary: Negative for dysuria, frequency and urgency.  Musculoskeletal: Negative for arthralgias and myalgias.  Skin: Negative for color change, pallor and rash.  Neurological: Negative for syncope, +intermittent episodes of light-headedness. Resolves with meclizine as needed.  Psychiatric/Behavioral: The patient is not nervous/anxious.       Objective:   Physical Exam  Constitutional: He is oriented to person, place, and time. He appears well-developed and well-nourished. No distress.  HENT:  Head: Normocephalic and atraumatic.  Right Ear: External ear normal.  Left Ear: External ear normal.  Mouth/Throat: Oropharynx is clear and moist.  Eyes: Pupils are equal, round, and reactive to light. EOM are normal. No scleral icterus.  Neck: Normal range of motion. Neck supple. No JVD present. No tracheal deviation present.  Cardiovascular: Normal rate, regular rhythm and normal heart sounds.  No murmur heard. No carotid bruit  Pulmonary/Chest: Effort normal and breath sounds normal.  Neurological: He is alert and oriented to person, place, and time. No cranial nerve deficit. Coordination normal.  Gait normal  Skin: Skin is warm and dry. No pallor.  Psychiatric: He has a normal mood and affect. His behavior is normal. Thought  content normal.  Nursing note and vitals reviewed.     Vitals:   09/11/17 0803  BP: (!) 138/98  Pulse: 66  Temp: 97.8 F (36.6 C)  SpO2: 96%    Assessment & Plan:   According to H&P intake while admitted patient was started on aspirin and Lovenox, but patient does not recall getting any Lovenox injections while admitted. It appears that patient should have been discharged home with daily aspirin, but aspirin is not on his discharge medication list. I reviewed case with Dr. Derrel Nip and she agrees that patient should be on daily low-dose aspirin.  His CBC is stable & most recent kidney functions done today 09/11/2017 are BUN 18, creatinine 1.45 and GFR 63.64  Vertigo --continue to use meclizine as needed for episodes of vertigo.  Be sure to keep up good fluid intake and change positions slowly.  TIA --due to findings of old infarct on MRI of brain and TIA during hospital admission, we will refer to neurology for evaluation/management.  HTN --continue lisinopril and amlodipine  Hyperlipidemia --continue pravastatin   Follow-up in approximately 2 months for recheck.  He will be called with information regarding neurology appointment.

## 2017-10-04 ENCOUNTER — Ambulatory Visit (INDEPENDENT_AMBULATORY_CARE_PROVIDER_SITE_OTHER): Payer: Medicare Other | Admitting: Neurology

## 2017-10-04 ENCOUNTER — Encounter: Payer: Self-pay | Admitting: Neurology

## 2017-10-04 ENCOUNTER — Telehealth: Payer: Self-pay | Admitting: Neurology

## 2017-10-04 VITALS — BP 131/84 | HR 83 | Ht 71.0 in | Wt 248.2 lb

## 2017-10-04 DIAGNOSIS — R42 Dizziness and giddiness: Secondary | ICD-10-CM | POA: Diagnosis not present

## 2017-10-04 DIAGNOSIS — H8112 Benign paroxysmal vertigo, left ear: Secondary | ICD-10-CM | POA: Diagnosis not present

## 2017-10-04 DIAGNOSIS — H81399 Other peripheral vertigo, unspecified ear: Secondary | ICD-10-CM

## 2017-10-04 NOTE — Progress Notes (Signed)
[facility] [address] [place]. [place] [address] [phone]       OFFICE CONSULT NOTE  [name] Date of Birth:  30-Jul-1955 Medical Record Number:  [id number]   Referring MD:  Philis Nettle, NP  Reason for Referral:  vertigo  HPI: Mr. Cowsert is a 62 year old pleasant Caucasian male seen today for initial office consultation visit for dizziness and TIA. History is obtained from the patient and review of referral notes. I have personally reviewed imaging films.He states on 08/19/17 he noticed dizziness and feeling of imbalance. While he was lying in bed in tried to turn to the left-sided noticed vertigo with a feeling of the room and had spinning that lasted few minutes.He continues to feel not well for several hours. He was taken to [facility] but by the time he was seen that he was feeling a lot better. He had MRI scan of the brain done on 08/20/17 which I personally reviewed shows no acute abnormality. Small CSF intensity lesions noted in the right cerebellum have been called infarcts but could well represent benign CSF cyst in my opinion. Carotid ultrasound done on 08/19/17 showed no significant eccentric sis. 11 A1c was 6.4 and LDL cholesterol was 119 mg percent. He was started on aspirin. He states his dizziness has resolved and has not recurred again. She does admit to occasional dizziness when he makes a sudden move of his head. He denies any prior history of loss of vision, headache,diplopia, gait or balance problems or focal extremity weakness. He didn't denies any history of significant head injury with loss of consciousness or seizures or migraine headaches.  ROS:   14 system review of systems is positive for  Dizziness,headache, vertigo and all other systems negative. PMH:  Past Medical History:  Diagnosis Date  . 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 Chronic kidney disease Nov 2013   admission for [facility] cr 11, acidosis  . HIV infection ([place])   .  Hyperglycemia 04/04/2016  . Hyperlipidemia   . Hypertension   . Urethritis 02/11/2015    Social History:  Social History   Socioeconomic History  . Marital status: Single    Spouse name: Not on file  . Number of children: Not on file  . Years of education: Not on file  . Highest education level: Not on file  Occupational History  . Not on file  Social Needs  . Financial resource strain: Not hard at all  . Food insecurity:    Worry: Never true    Inability: Never true  . Transportation needs:    Medical: No    Non-medical: No  Tobacco Use  . Smoking status: Never Smoker  . Smokeless tobacco: Never Used  Substance and Sexual Activity  . Alcohol use: No  . Drug use: No  . Sexual activity: Not Currently    Comment: declined condoms  Lifestyle  . Physical activity:    Days per week: 7 days    Minutes per session: 30 min  . Stress: Not at all  Relationships  . Social connections:    Talks on phone: Not on file    Gets together: Not on file    Attends religious service: Not on file    Active member of club or organization: Not on file    Attends meetings of clubs or organizations: Not on file    Relationship status: Not on file  . Intimate partner violence:    Fear of current or ex partner:  Not on file    Emotionally abused: Not on file    Physically abused: Not on file    Forced sexual activity: Not on file  Other Topics Concern  . Not on file  Social History Narrative   Independent baseline. Lives by himself.  Education 12th.  Children none.  Caffeine tea one cup 4 x week.    Medications:   Current Outpatient Medications on File Prior to Visit  Medication Sig Dispense Refill  . amLODipine (NORVASC) 5 MG tablet TAKE 1 TABLET (5 MG TOTAL) BY MOUTH DAILY. 30 tablet 3  . aspirin EC 81 MG tablet Take 1 tablet (81 mg total) by mouth daily. 90 tablet 3  . darunavir (PREZISTA) 800 MG tablet Take 1 tablet (800 mg total) by mouth daily. 90 tablet 4  .  elvitegravir-cobicistat-emtricitabine-tenofovir (GENVOYA) 150-150-200-10 MG TABS tablet Take 1 tablet by mouth daily with breakfast. 90 tablet 4  . lisinopril (PRINIVIL,ZESTRIL) 20 MG tablet TAKE 1 TABLET BY MOUTH EVERY DAY 90 tablet 1  . meclizine (ANTIVERT) 25 MG tablet Take 1 tablet (25 mg total) by mouth 3 (three) times daily as needed for dizziness. 30 tablet 0  . pravastatin (PRAVACHOL) 40 MG tablet Take 1 tablet (40 mg total) by mouth daily. 90 tablet 3  . ULORIC 40 MG tablet TAKE 1 TABLET BY MOUTH EVERY DAY 90 tablet 0   No current facility-administered medications on file prior to visit.     Allergies:  No Known Allergies  Physical Exam General: obese middle-age Caucasian male, seated, in no evident distress Head: head normocephalic and atraumatic.   Neck: supple with no carotid or supraclavicular bruits Cardiovascular: regular rate and rhythm, no murmurs Musculoskeletal: no deformity Skin:  no rash/petichiae Vascular:  Normal pulses all extremities  Neurologic Exam Mental Status: Awake and fully alert. Oriented to place and time. Recent and remote memory intact. Attention span, concentration and fund of knowledge appropriate. Mood and affect appropriate.  Cranial Nerves: Fundoscopic exam reveals sharp disc margins. Pupils equal, briskly reactive to light. Extraocular movements full without nystagmus. Visual fields full to confrontation. Hearing intact. Facial sensation intact. Face, tongue, palate moves normally and symmetrically.  Motor: Normal bulk and tone. Normal strength in all tested extremity muscles. Sensory.: intact to touch , pinprick , position and vibratory sensation.  Coordination: Rapid alternating movements normal in all extremities. Finger-to-nose and heel-to-shin performed accurately bilaterally.head shaking produces no nystagmus or dizziness. Fukuda stepping test is positive with the patient moving off base and turning to the right side. Hallpike maneuver was not  done Gait and Station: Arises from chair without difficulty. Stance is normal. Gait demonstrates normal stride length and balance . Able to heel, toe and tandem walk without difficulty.  Reflexes: 1+ and symmetric. Toes downgoing.   NIHSS  0 Modified Rankin  0   ASSESSMENT: 62 year old Caucasian male with transient episode of positionalvertigo and dizziness likely due to benign paroxysmal positional vertigo. MRI scan of the brain shows small right cerebellar hyperintensities possibilities benign CSF cysts rather than remote age infarcts.     PLAN: I had a long discussion with the patient regarding his episode of vertigo and discussed differential diagnosis and plan for evaluation and treatment. Given his abnormal MRI findings I recommend further evaluation by checking MRA of the brain and neck. Referred to physical therapy for vestibular rehabilitation and dizziness exercises. He will continue on aspirin for stroke prevention and maintain strict control of hypertension with blood pressure goal below 130/90 and lipids  with LDL cholesterol goal below 70 mg percent. He wasl also encouraged to eat a healthy diet and exercise regularly . Greater than 50% time during this 45 minute  consultation visit was spent on counseling and coordination of care about his dizziness and vertigo and unsteady questions.He will return for follow-up in 2 months or call earlier if necessary. Antony Contras, MD  [facility] [address] [place], [place] [address]  Phone [phone] Fax [phone] Note: This document was prepared with digital dictation and possible smart phrase technology. Any transcriptional errors that result from this process are unintentional.

## 2017-10-04 NOTE — Patient Instructions (Signed)
I had a long discussion with the patient regarding his episode of vertigo and discussed differential diagnosis and plan for evaluation and treatment. Given his abnormal MRI findings I recommend further evaluation by checking MRA of the brain and neck. Referred to physical therapy for vestibular rehabilitation and dizziness exercises. He will continue on aspirin for stroke prevention and maintain strict control of hypertension with blood pressure goal below 130/90 and lipids with LDL cholesterol goal below 70 mg percent. He'll also encouraged to eat a healthy diet and exercise regularly and asleep. He will return for follow-up in 2 months or call earlier if necessary.  Vertigo Vertigo means that you feel like you are moving when you are not. Vertigo can also make you feel like things around you are moving when they are not. This feeling can come and go at any time. Vertigo often goes away on its own. Follow these instructions at home:  Avoid making fast movements.  Avoid driving.  Avoid using heavy machinery.  Avoid doing any task or activity that might cause danger to you or other people if you would have a vertigo attack while you are doing it.  Sit down right away if you feel dizzy or have trouble with your balance.  Take over-the-counter and prescription medicines only as told by your doctor.  Follow instructions from your doctor about which positions or movements you should avoid.  Drink enough fluid to keep your pee (urine) clear or pale yellow.  Keep all follow-up visits as told by your doctor. This is important. Contact a doctor if:  Medicine does not help your vertigo.  You have a fever.  Your problems get worse or you have new symptoms.  Your family or friends see changes in your behavior.  You feel sick to your stomach (nauseous) or you throw up (vomit).  You have a "pins and needles" feeling or you are numb in part of your body. Get help right away if:  You have  trouble moving or talking.  You are always dizzy.  You pass out (faint).  You get very bad headaches.  You feel weak or have trouble using your hands, arms, or legs.  You have changes in your hearing.  You have changes in your seeing (vision).  You get a stiff neck.  Bright light starts to bother you. This information is not intended to replace advice given to you by your health care provider. Make sure you discuss any questions you have with your health care provider. Document Released: 10/19/2007 Document Revised: 06/17/2015 Document Reviewed: 05/04/2014 Elsevier Interactive Patient Education  [employer].

## 2017-10-04 NOTE — Telephone Encounter (Signed)
Medicare order sent to GI. No auth they will reach out to the pt to schedule.  °

## 2017-10-12 DIAGNOSIS — Z23 Encounter for immunization: Secondary | ICD-10-CM | POA: Diagnosis not present

## 2017-10-23 ENCOUNTER — Other Ambulatory Visit: Payer: Self-pay | Admitting: Infectious Disease

## 2017-10-23 DIAGNOSIS — B20 Human immunodeficiency virus [HIV] disease: Secondary | ICD-10-CM

## 2017-10-25 ENCOUNTER — Ambulatory Visit
Admission: RE | Admit: 2017-10-25 | Discharge: 2017-10-25 | Disposition: A | Discharge status: Home / Self Care | Payer: Medicare Other | Source: Ambulatory Visit | Attending: Neurology | Admitting: Neurology

## 2017-10-25 DIAGNOSIS — H81399 Other peripheral vertigo, unspecified ear: Secondary | ICD-10-CM

## 2017-10-25 MED ORDER — GADOBENATE DIMEGLUMINE 529 MG/ML IV SOLN
20.0000 mL | Freq: Once | INTRAVENOUS | Status: AC | PRN
  Administered 2017-10-25: 20 mL via INTRAVENOUS

## 2017-10-29 ENCOUNTER — Telehealth: Payer: Self-pay

## 2017-10-29 NOTE — Telephone Encounter (Signed)
Notes recorded by Marval Regal, RN on 10/29/2017 at 2:46 PM EDT Left vm for patient to call back about MRA neck, and MRA brain results

## 2017-10-29 NOTE — Telephone Encounter (Signed)
-----   Message from Garvin Fila, MD sent at 10/26/2017  4:30 PM EDT ----- Mitchell Heir inform the patient that both MRA of the neck and brain were normal and do not show any significant narrowing of the blood vessels in the neck or the brain.

## 2017-10-30 ENCOUNTER — Telehealth: Payer: Self-pay | Admitting: *Deleted

## 2017-10-30 NOTE — Telephone Encounter (Signed)
Pt returning your call need MRI results. Please call [phone]

## 2017-10-30 NOTE — Telephone Encounter (Signed)
Rn call patient that the MRA neck and MRA brain were normal,and do not show any significant narrowing of the blood vessels in the neck or the brain.Pt verbalized understanding.

## 2017-11-06 ENCOUNTER — Other Ambulatory Visit (HOSPITAL_COMMUNITY)
Admission: RE | Admit: 2017-11-06 | Discharge: 2017-11-06 | Disposition: A | Discharge status: Home / Self Care | Payer: Medicare Other | Source: Ambulatory Visit | Attending: Infectious Disease | Admitting: Infectious Disease

## 2017-11-06 ENCOUNTER — Other Ambulatory Visit: Payer: Medicare Other

## 2017-11-06 DIAGNOSIS — A523 Neurosyphilis, unspecified: Secondary | ICD-10-CM | POA: Insufficient documentation

## 2017-11-06 DIAGNOSIS — Z113 Encounter for screening for infections with a predominantly sexual mode of transmission: Secondary | ICD-10-CM

## 2017-11-06 DIAGNOSIS — I10 Essential (primary) hypertension: Secondary | ICD-10-CM | POA: Diagnosis not present

## 2017-11-06 DIAGNOSIS — B20 Human immunodeficiency virus [HIV] disease: Secondary | ICD-10-CM

## 2017-11-06 DIAGNOSIS — E785 Hyperlipidemia, unspecified: Secondary | ICD-10-CM | POA: Insufficient documentation

## 2017-11-06 DIAGNOSIS — A528 Late syphilis, latent: Secondary | ICD-10-CM

## 2017-11-07 LAB — URINE CYTOLOGY ANCILLARY ONLY
Chlamydia: NEGATIVE
Neisseria Gonorrhea: NEGATIVE

## 2017-11-07 LAB — T-HELPER CELL (CD4) - (RCID CLINIC ONLY)
CD4 % Helper T Cell: 28 % — ABNORMAL LOW (ref 33–55)
CD4 T Cell Abs: 1140 /uL (ref 400–2700)

## 2017-11-08 LAB — COMPLETE METABOLIC PANEL WITH GFR
AG Ratio: 1.2 (calc) (ref 1.0–2.5)
ALT: 52 U/L — ABNORMAL HIGH (ref 9–46)
AST: 42 U/L — ABNORMAL HIGH (ref 10–35)
Albumin: 4.5 g/dL (ref 3.6–5.1)
Alkaline phosphatase (APISO): 56 U/L (ref 40–115)
BUN: 12 mg/dL (ref 7–25)
CO2: 26 mmol/L (ref 20–32)
Calcium: 9.8 mg/dL (ref 8.6–10.3)
Chloride: 103 mmol/L (ref 98–110)
Creat: 1.19 mg/dL (ref 0.70–1.25)
GFR, Est African American: 76 mL/min/{1.73_m2} (ref >=60)
GFR, Est Non African American: 66 mL/min/{1.73_m2} (ref >=60)
Globulin: 3.7 g/dL (calc) (ref 1.9–3.7)
Glucose, Bld: 114 mg/dL — ABNORMAL HIGH (ref 65–99)
Potassium: 5 mmol/L (ref 3.5–5.3)
Sodium: 138 mmol/L (ref 135–146)
Total Bilirubin: 0.4 mg/dL (ref 0.2–1.2)
Total Protein: 8.2 g/dL — ABNORMAL HIGH (ref 6.1–8.1)

## 2017-11-08 LAB — CBC WITH DIFFERENTIAL/PLATELET
Basophils Absolute: 83 cells/uL (ref 0–200)
Basophils Relative: 1.1 %
Eosinophils Absolute: 353 cells/uL (ref 15–500)
Eosinophils Relative: 4.7 %
HCT: 40.4 % (ref 38.5–50.0)
Hemoglobin: 14.4 g/dL (ref 13.2–17.1)
Lymphs Abs: 4260 cells/uL — ABNORMAL HIGH (ref 850–3900)
MCH: 33 pg (ref 27.0–33.0)
MCHC: 35.6 g/dL (ref 32.0–36.0)
MCV: 92.4 fL (ref 80.0–100.0)
MPV: 9.8 fL (ref 7.5–12.5)
Monocytes Relative: 9.9 %
Neutro Abs: 2063 cells/uL (ref 1500–7800)
Neutrophils Relative %: 27.5 %
Platelets: 377 10*3/uL (ref 140–400)
RBC: 4.37 10*6/uL (ref 4.20–5.80)
RDW: 12.7 % (ref 11.0–15.0)
Total Lymphocyte: 56.8 %
WBC mixed population: 743 cells/uL (ref 200–950)
WBC: 7.5 10*3/uL (ref 3.8–10.8)

## 2017-11-08 LAB — LIPID PANEL
Cholesterol: 173 mg/dL (ref <200)
HDL: 41 mg/dL (ref >40)
LDL Cholesterol (Calc): 107 mg/dL (calc) — ABNORMAL HIGH
Non-HDL Cholesterol (Calc): 132 mg/dL (calc) — ABNORMAL HIGH (ref <130)
Total CHOL/HDL Ratio: 4.2 (calc) (ref <5.0)
Triglycerides: 136 mg/dL (ref <150)

## 2017-11-08 LAB — FLUORESCENT TREPONEMAL AB(FTA)-IGG-BLD: Fluorescent Treponemal ABS: REACTIVE — AB

## 2017-11-08 LAB — HIV-1 RNA QUANT-NO REFLEX-BLD
HIV 1 RNA Quant: <20 copies/mL
HIV-1 RNA Quant, Log: <1.3 Log copies/mL

## 2017-11-08 LAB — RPR TITER: RPR Titer: 1:2 {titer} — ABNORMAL HIGH

## 2017-11-08 LAB — RPR: RPR Ser Ql: REACTIVE — AB

## 2017-11-15 ENCOUNTER — Other Ambulatory Visit: Payer: Self-pay | Admitting: Infectious Disease

## 2017-11-15 DIAGNOSIS — B20 Human immunodeficiency virus [HIV] disease: Secondary | ICD-10-CM

## 2017-11-21 ENCOUNTER — Other Ambulatory Visit: Payer: Self-pay | Admitting: Internal Medicine

## 2017-11-26 ENCOUNTER — Encounter: Payer: Self-pay | Admitting: Infectious Diseases

## 2017-11-26 ENCOUNTER — Ambulatory Visit (INDEPENDENT_AMBULATORY_CARE_PROVIDER_SITE_OTHER): Payer: Medicare Other | Admitting: Infectious Diseases

## 2017-11-26 VITALS — BP 147/97 | HR 66 | Temp 97.6°F | Ht 71.0 in | Wt 242.0 lb

## 2017-11-26 DIAGNOSIS — E785 Hyperlipidemia, unspecified: Secondary | ICD-10-CM

## 2017-11-26 DIAGNOSIS — R7303 Prediabetes: Secondary | ICD-10-CM

## 2017-11-26 DIAGNOSIS — Z0000 Encounter for general adult medical examination without abnormal findings: Secondary | ICD-10-CM | POA: Diagnosis not present

## 2017-11-26 DIAGNOSIS — Q8901 Asplenia (congenital): Secondary | ICD-10-CM | POA: Diagnosis not present

## 2017-11-26 DIAGNOSIS — B20 Human immunodeficiency virus [HIV] disease: Secondary | ICD-10-CM

## 2017-11-26 DIAGNOSIS — R945 Abnormal results of liver function studies: Secondary | ICD-10-CM | POA: Diagnosis not present

## 2017-11-26 DIAGNOSIS — R7989 Other specified abnormal findings of blood chemistry: Secondary | ICD-10-CM

## 2017-11-26 MED ORDER — ELVITEG-COBIC-EMTRICIT-TENOFAF 150-150-200-10 MG PO TABS: ORAL_TABLET | ORAL | 11 refills | Status: DC

## 2017-11-26 MED ORDER — DARUNAVIR ETHANOLATE 800 MG PO TABS: ORAL_TABLET | ORAL | 11 refills | Status: DC

## 2017-11-26 NOTE — Patient Instructions (Addendum)
Please continue taking your Prezista and Genvoya once a day. You are doing wonderful! Labs for your HIV look excellent.   Please work on the diet changes we spoke about. Your labwork 3 months ago showed you have pre-diabetes. Would work with your primary care team for further need for treatment on this.   Follow up in 1 year with Dr. [name] with labs prior to.

## 2017-11-27 DIAGNOSIS — R7989 Other specified abnormal findings of blood chemistry: Secondary | ICD-10-CM | POA: Insufficient documentation

## 2017-11-27 DIAGNOSIS — R945 Abnormal results of liver function studies: Secondary | ICD-10-CM

## 2017-11-27 NOTE — Progress Notes (Signed)
Name: Adam Becker  DOB: [birth year] MRN: [id number] PCP: [name], MD   Patient Active Problem List   Diagnosis Date Noted  . Elevated liver function tests 11/27/2017  . Vertigo 09/11/2017  . Dizziness 09/11/2017  . TIA (transient ischemic attack) 08/18/2017  . Need for immunization against influenza 10/10/2016  . Routine screening for STI (sexually transmitted infection) 10/10/2016  . Impaired fasting glucose 07/27/2015  . Urethritis 02/11/2015  . 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 Routine adult health maintenance 04/23/2014  . Allergic rhinitis due to allergen 04/23/2014  . 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 Gout attack 09/20/2012  . Obesity (BMI 30.0-34.9) 12/09/2011  . Hyperlipidemia 11/01/2009  . NEUROSYPHILIS 03/11/2009  . PARESTHESIA, HANDS 06/18/2008  . LATE SYPHILIS, LATENT 10/30/2007  . ERECTILE DYSFUNCTION, MILD 12/31/2006  . HYPERTENSION, BENIGN ESSENTIAL 10/15/2006  . Human immunodeficiency virus (HIV) disease ([place]) 04/16/2006     Subjective:  Adam Becker is a 62 y.o. man with HIV disease here today for routine follow up care.   Adam Becker is here today without much complaint/concern. Tells me he has been trying to work on weight loss and reducing foods that are making his blood sugar high. He is not knowingly diabetic but has family history and elevated blood sugars in the past. He tells me he eats fish (salmon) and salads often but does have He continues on his Bath and reports 100% adherence to his regimen and is taking it with food.   Review of Systems  Constitutional: Negative for chills, fever, malaise/fatigue and weight loss.  HENT: Negative for sore throat.        No dental problems  Respiratory: Negative for cough and sputum production.   Cardiovascular: Negative for chest pain and leg swelling.  Gastrointestinal: Negative for abdominal pain, diarrhea and vomiting.    Genitourinary: Negative for dysuria and flank pain.  Musculoskeletal: Negative for joint pain, myalgias and neck pain.  Skin: Negative for rash.  Neurological: Negative for dizziness, tingling and headaches.  Psychiatric/Behavioral: Negative for depression and substance abuse. The patient is not nervous/anxious and does not have insomnia.     Past Medical History:  Diagnosis Date  . 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 Chronic kidney disease Nov 2013   admission for ATN cr 11, acidosis  . HIV infection ([place])   . Hyperglycemia 04/04/2016  . Hyperlipidemia   . Hypertension   . Urethritis 02/11/2015    Outpatient Medications Prior to Visit  Medication Sig Dispense Refill  . amLODipine (NORVASC) 5 MG tablet TAKE 1 TABLET (5 MG TOTAL) BY MOUTH DAILY. 30 tablet 3  . aspirin EC 81 MG tablet Take 1 tablet (81 mg total) by mouth daily. 90 tablet 3  . febuxostat (ULORIC) 40 MG tablet TAKE 1 TABLET BY MOUTH EVERY DAY 90 tablet 0  . lisinopril (PRINIVIL,ZESTRIL) 20 MG tablet TAKE 1 TABLET BY MOUTH EVERY DAY 90 tablet 1  . meclizine (ANTIVERT) 25 MG tablet Take 1 tablet (25 mg total) by mouth 3 (three) times daily as needed for dizziness. 30 tablet 0  . pravastatin (PRAVACHOL) 40 MG tablet Take 1 tablet (40 mg total) by mouth daily. 90 tablet 3  . aspirin 81 MG tablet Take 1 tablet by mouth daily.  3  . GENVOYA 150-150-200-10 MG TABS tablet TAKE 1 TABLET BY MOUTH EVERY DAY WITH BREAKFAST 30 tablet 0  . PREZISTA 800 MG tablet TAKE 1 TABLET (800 MG TOTAL) BY  MOUTH DAILY. 30 tablet 0   No facility-administered medications prior to visit.      No Known Allergies  Social History   Tobacco Use  . Smoking status: Never Smoker  . Smokeless tobacco: Never Used  Substance Use Topics  . Alcohol use: No  . Drug use: No    Family History  Problem Relation Age of Onset  . Cancer Mother   . Breast cancer Mother   . Colon cancer Father   . Birth defects Son        Breast     Social History   Substance  and Sexual Activity  Sexual Activity Not Currently   Comment: declined condoms     Objective:   Vitals:   11/26/17 1059  BP: (!) 147/97  Pulse: 66  Temp: 97.6 F (36.4 C)  Weight: 242 lb (109.8 kg)  Height: 5\' 11"  (1.803 m)   Body mass index is 33.75 kg/m.  Physical Exam  Constitutional: He is oriented to person, place, and time. He appears well-developed and well-nourished.  Seated comfortably in chair during visit.   HENT:  Mouth/Throat: Oropharynx is clear and moist and mucous membranes are normal. Normal dentition. No dental abscesses.  Cardiovascular: Normal rate, regular rhythm and normal heart sounds.  Pulmonary/Chest: Effort normal and breath sounds normal.  Abdominal: Soft. He exhibits no distension. There is no tenderness.  Lymphadenopathy:    He has no cervical adenopathy.  Neurological: He is alert and oriented to person, place, and time.  Skin: Skin is warm and dry. No rash noted.  Psychiatric: He has a normal mood and affect. Judgment normal.  In good spirits today and engaged in care discussion.     Lab Results Lab Results  Component Value Date   WBC 7.5 11/06/2017   HGB 14.4 11/06/2017   HCT 40.4 11/06/2017   MCV 92.4 11/06/2017   PLT 377 11/06/2017    Lab Results  Component Value Date   CREATININE 1.19 11/06/2017   BUN 12 11/06/2017   NA 138 11/06/2017   K 5.0 11/06/2017   CL 103 11/06/2017   CO2 26 11/06/2017    Lab Results  Component Value Date   ALT 52 (H) 11/06/2017   AST 42 (H) 11/06/2017   ALKPHOS 55 03/15/2017   BILITOT 0.4 11/06/2017    Lab Results  Component Value Date   CHOL 173 11/06/2017   HDL 41 11/06/2017   LDLCALC 107 (H) 11/06/2017   LDLDIRECT 99 07/26/2015   TRIG 136 11/06/2017   CHOLHDL 4.2 11/06/2017   HIV 1 RNA Quant (copies/mL)  Date Value  11/06/2017 <20 NOT DETECTED  09/26/2016 <20 DETECTED (A)  03/21/2016 <20 DETECTED (A)   CD4 T Cell Abs (/uL)  Date Value  11/06/2017 1,140  09/26/2016 1,460    03/21/2016 850     Assessment & Plan:   Problem List Items Addressed This Visit      Unprioritized   Asplenia    In accordance with the ACIP guidelines for vaccines in asplenic people - he has received his 2 (3 actually) doses of pneumovax, prevnar and meningococcal series. He will need another booster of Pneumovax @ 62yo. Flu vaccine today.       Elevated liver function tests    Likely due fatty diet / dietary indiscretion. Hep serologies done > 10 years ago - would consider repeating hep b sAg if remains elevated. Counseled.       Human immunodeficiency virus (HIV) disease ([place])  Well controlled. Continue Genvoya + Prezista. May be a good candidate for biktarvy vs juluca in the future to avoid cobicistat and tenofovir regimen with his multiple comorbidities; although I don't see any information with regards to his genotype. Presuming he has some resistance being he is on a salvage regimen. He can return to clinic with Dr. [name] in 1 year.  Reviewed labs in detail.  Refused condoms today - says he "gave that up 4 years ago."       Relevant Medications   darunavir (PREZISTA) 800 MG tablet   elvitegravir-cobicistat-emtricitabine-tenofovir (GENVOYA) 150-150-200-10 MG TABS tablet   Other Relevant Orders   CBC   T-helper cell (CD4)- ([facility] clinic only)   Comprehensive metabolic panel   RPR   HIV-1 RNA quant-no reflex-bld   Hyperlipidemia    Lab Results  Component Value Date   CHOL 173 11/06/2017   HDL 41 11/06/2017   LDLCALC 107 (H) 11/06/2017   LDLDIRECT 99 07/26/2015   TRIG 136 11/06/2017   CHOLHDL 4.2 11/06/2017   On pravastatin. Has had TIAs in the past; also with hypertension and early/impending diabetes (A1C6.4%). Discussed dietary changes and request to consider reducing carbohydrate consumption in the form of flour/sugar and processed products (he is a snacker), no late night eating and eliminating sweet tea (he will not do this).         Relevant Orders    Lipid panel   Routine adult health maintenance    Flu vaccine today. Otherwise up to date as above.        Other Visit Diagnoses    Pre-diabetes    -  Primary   Relevant Orders   Hemoglobin A1c      Janene Madeira, MSN, NP-C [facility] for Infectious [place] Pager: [phone] Office: [phone]  11/27/17  2:08 PM

## 2017-11-27 NOTE — Assessment & Plan Note (Signed)
Lab Results  Component Value Date   CHOL 173 11/06/2017   HDL 41 11/06/2017   LDLCALC 107 (H) 11/06/2017   LDLDIRECT 99 07/26/2015   TRIG 136 11/06/2017   CHOLHDL 4.2 11/06/2017   On pravastatin. Has had TIAs in the past; also with hypertension and early/impending diabetes (A1C6.4%). Discussed dietary changes and request to consider reducing carbohydrate consumption in the form of flour/sugar and processed products (he is a snacker), no late night eating and eliminating sweet tea (he will not do this).

## 2017-11-27 NOTE — Assessment & Plan Note (Signed)
Well controlled. Continue Genvoya + Prezista. May be a good candidate for biktarvy vs juluca in the future to avoid cobicistat and tenofovir regimen with his multiple comorbidities; although I don't see any information with regards to his genotype. Presuming he has some resistance being he is on a salvage regimen. He can return to clinic with Dr. [name] in 1 year.  Reviewed labs in detail.  Refused condoms today - says he "gave that up 4 years ago."

## 2017-11-27 NOTE — Assessment & Plan Note (Signed)
Likely due fatty diet / dietary indiscretion. Hep serologies done > 10 years ago - would consider repeating hep b sAg if remains elevated. Counseled.

## 2017-11-27 NOTE — Assessment & Plan Note (Signed)
In accordance with the ACIP guidelines for vaccines in asplenic people - he has received his 2 (3 actually) doses of pneumovax, prevnar and meningococcal series. He will need another booster of Pneumovax @ 62yo. Flu vaccine today.

## 2017-11-27 NOTE — Assessment & Plan Note (Signed)
Flu vaccine today. Otherwise up to date as above.

## 2017-12-06 ENCOUNTER — Other Ambulatory Visit: Payer: Self-pay | Admitting: Infectious Disease

## 2017-12-06 DIAGNOSIS — E785 Hyperlipidemia, unspecified: Secondary | ICD-10-CM

## 2017-12-11 ENCOUNTER — Ambulatory Visit: Payer: Medicare Other | Admitting: Neurology

## 2017-12-11 ENCOUNTER — Encounter: Payer: Self-pay | Admitting: Neurology

## 2017-12-21 ENCOUNTER — Ambulatory Visit: Payer: Self-pay

## 2017-12-21 NOTE — Telephone Encounter (Signed)
Pt. Reports his right foot started hurting this past Wednesday. Reports he is "pretty sure it is my gout." Offered appointment at [facility] today. Refuses. States "I'll go to [facility] or [facility]."  Reason for Disposition . [1] Redness of the skin AND [2] no fever  Answer Assessment - Initial Assessment Questions 1. ONSET: "When did the pain start?"      Started Wed. 2. LOCATION: "Where is the pain located?"      Right foot - middle of foot 3. PAIN: "How bad is the pain?"    (Scale 1-10; or mild, moderate, severe)   -  MILD (1-3): doesn't interfere with normal activities    -  MODERATE (4-7): interferes with normal activities (e.g., work or school) or awakens from sleep, limping    -  SEVERE (8-10): excruciating pain, unable to do any normal activities, unable to walk     7 4. WORK OR EXERCISE: "Has there been any recent work or exercise that involved this part of the body?"      No 5. CAUSE: "What do you think is causing the foot pain?"     Gout 6. OTHER SYMPTOMS: "Do you have any other symptoms?" (e.g., leg pain, rash, fever, numbness)     No 7. PREGNANCY: "Is there any chance you are pregnant?" "When was your last menstrual period?"     n/a  Protocols used: FOOT PAIN-A-AH

## 2018-01-02 ENCOUNTER — Telehealth: Payer: Self-pay | Admitting: *Deleted

## 2018-01-02 NOTE — Telephone Encounter (Signed)
Copied from [facility] [id number]. Topic: Appointment Scheduling - Scheduling Inquiry for Clinic >> Jan 02, 2018 11:16 AM Adam Becker, NT wrote: Reason for CRM: patient is calling and was triaged on 12/21/17 for foot pain. He states both of his feet has a tingling feeling in it and he would like to be seen by Dr. Derrel Nip. She is booked out a couple weeks, I offered another provider patient requested I send a message to see if he can be worked in. Please advise.

## 2018-01-02 NOTE — Telephone Encounter (Signed)
Patient C/O tingling restless legs mainly at night , scheduled for 01/10/18 . FYI

## 2018-01-10 ENCOUNTER — Ambulatory Visit (INDEPENDENT_AMBULATORY_CARE_PROVIDER_SITE_OTHER): Payer: Medicare Other | Admitting: Internal Medicine

## 2018-01-10 ENCOUNTER — Encounter: Payer: Self-pay | Admitting: Internal Medicine

## 2018-01-10 VITALS — BP 122/86 | HR 76 | Temp 98.6°F | Resp 15 | Ht 71.0 in | Wt 248.8 lb

## 2018-01-10 DIAGNOSIS — E119 Type 2 diabetes mellitus without complications: Secondary | ICD-10-CM

## 2018-01-10 DIAGNOSIS — R42 Dizziness and giddiness: Secondary | ICD-10-CM

## 2018-01-10 DIAGNOSIS — R200 Anesthesia of skin: Secondary | ICD-10-CM | POA: Diagnosis not present

## 2018-01-10 DIAGNOSIS — I10 Essential (primary) hypertension: Secondary | ICD-10-CM | POA: Diagnosis not present

## 2018-01-10 DIAGNOSIS — G629 Polyneuropathy, unspecified: Secondary | ICD-10-CM

## 2018-01-10 DIAGNOSIS — R7301 Impaired fasting glucose: Secondary | ICD-10-CM

## 2018-01-10 DIAGNOSIS — E1121 Type 2 diabetes mellitus with diabetic nephropathy: Secondary | ICD-10-CM

## 2018-01-10 DIAGNOSIS — R202 Paresthesia of skin: Secondary | ICD-10-CM | POA: Diagnosis not present

## 2018-01-10 LAB — TSH: TSH: 0.91 u[IU]/mL (ref 0.35–4.50)

## 2018-01-10 LAB — HEMOGLOBIN A1C: Hgb A1c MFr Bld: 6.5 % (ref 4.6–6.5)

## 2018-01-10 LAB — T4, FREE: Free T4: 0.74 ng/dL (ref 0.60–1.60)

## 2018-01-10 LAB — VITAMIN B12: Vitamin B-12: 388 pg/mL (ref 211–911)

## 2018-01-10 MED ORDER — MECLIZINE HCL 25 MG PO TABS: 25.0000 mg | ORAL_TABLET | Freq: Three times a day (TID) | ORAL | 2 refills | Status: DC | PRN

## 2018-01-10 NOTE — Patient Instructions (Addendum)
i Peripheral Neuropathy Peripheral neuropathy is a type of nerve damage. It affects nerves that carry signals between the spinal cord and the arms, legs, and the rest of the body (peripheral nerves). It does not affect nerves in the spinal cord or brain. In peripheral neuropathy, one nerve or a group of nerves may be damaged. Peripheral neuropathy is a broad category that includes many specific nerve disorders, like diabetic neuropathy, hereditary neuropathy, and carpal tunnel syndrome. What are the causes? This condition may be caused by:  Diabetes. This is the most common cause of peripheral neuropathy.  Nerve injury.  Pressure or stress on a nerve that lasts a long time.  Lack (deficiency) of B vitamins. This can result from alcoholism, poor diet, or a restricted diet.  Infections.  Autoimmune diseases, such as rheumatoid arthritis and systemic lupus erythematosus.  Nerve diseases that are passed from parent to child (inherited).  Some medicines, such as cancer medicines (chemotherapy).  Poisonous (toxic) substances, such as lead and mercury.  Too little blood flowing to the legs.  Kidney disease.  Thyroid disease. In some cases, the cause of this condition is not known. What are the signs or symptoms? Symptoms of this condition depend on which of your nerves is damaged. Common symptoms include:  Loss of feeling (numbness) in the feet, hands, or both.  Tingling in the feet, hands, or both.  Burning pain.  Very sensitive skin.  Weakness.  Not being able to move a part of the body (paralysis).  Muscle twitching.  Clumsiness or poor coordination.  Loss of balance.  Not being able to control your bladder.  Feeling dizzy.  Sexual problems. How is this diagnosed? Diagnosing and finding the cause of peripheral neuropathy can be difficult. Your health care provider will take your medical history and do a physical exam. A neurological exam will also be done. This  involves checking things that are affected by your brain, spinal cord, and nerves (nervous system). For example, your health care provider will check your reflexes, how you move, and what you can feel. You may have other tests, such as:  Blood tests.  Electromyogram (EMG) and nerve conduction tests. These tests check nerve function and how well the nerves are controlling the muscles.  Imaging tests, such as CT scans or MRI to rule out other causes of your symptoms.  Removing a small piece of nerve to be examined in a lab (nerve biopsy). This is rare.  Removing and examining a small amount of the fluid that surrounds the brain and spinal cord (lumbar puncture). This is rare. How is this treated? Treatment for this condition may involve:  Treating the underlying cause of the neuropathy, such as diabetes, kidney disease, or vitamin deficiencies.  Stopping medicines that can cause neuropathy, such as chemotherapy.  Medicine to relieve pain. Medicines may include: ? Prescription or over-the-counter pain medicine. ? Antiseizure medicine. ? Antidepressants. ? Pain-relieving patches that are applied to painful areas of skin.  Surgery to relieve pressure on a nerve or to destroy a nerve that is causing pain.  Physical therapy to help improve movement and balance.  Devices to help you move around (assistive devices). Follow these instructions at home: Medicines  Take over-the-counter and prescription medicines only as told by your health care provider. Do not take any other medicines without first asking your health care provider.  Do not drive or use heavy machinery while taking prescription pain medicine. Lifestyle   Do not use any products that contain  nicotine or tobacco, such as cigarettes and e-cigarettes. Smoking keeps blood from reaching damaged nerves. If you need help quitting, ask your health care provider.  Avoid or limit alcohol. Too much alcohol can cause a vitamin B  deficiency, and vitamin B is needed for healthy nerves.  Eat a healthy diet. This includes: ? Eating foods that are high in fiber, such as fresh fruits and vegetables, whole grains, and beans. ? Limiting foods that are high in fat and processed sugars, such as fried or sweet foods. General instructions   If you have diabetes, work closely with your health care provider to keep your blood sugar under control.  If you have numbness in your feet: ? Check every day for signs of injury or infection. Watch for redness, warmth, and swelling. ? Wear padded socks and comfortable shoes. These help protect your feet.  Develop a good support system. Living with peripheral neuropathy can be stressful. Consider talking with a mental health specialist or joining a support group.  Use assistive devices and attend physical therapy as told by your health care provider. This may include using a walker or a cane.  Keep all follow-up visits as told by your health care provider. This is important. Contact a health care provider if:  You have new signs or symptoms of peripheral neuropathy.  You are struggling emotionally from dealing with peripheral neuropathy.  Your pain is not well-controlled. Get help right away if:  You have an injury or infection that is not healing normally.  You develop new weakness in an arm or leg.  You fall frequently. Summary  Peripheral neuropathy is when the nerves in the arms, or legs are damaged, resulting in numbness, weakness, or pain.  There are many causes of peripheral neuropathy, including diabetes, pinched nerves, vitamin deficiencies, autoimmune disease, and hereditary conditions.  Diagnosing and finding the cause of peripheral neuropathy can be difficult. Your health care provider will take your medical history, do a physical exam, and do tests, including blood tests and nerve function tests.  Treatment involves treating the underlying cause of the  neuropathy and taking medicines to help control pain. Physical therapy and assistive devices may also help. This information is not intended to replace advice given to you by your health care provider. Make sure you discuss any questions you have with your health care provider. Document Released: 12/30/2001 Document Revised: 03/20/2016 Document Reviewed: 03/20/2016 Elsevier Interactive Patient Education  2019 [employer].

## 2018-01-10 NOTE — Progress Notes (Signed)
Subjective:  Patient ID: Adam Becker, male    DOB: 05-28-1955  Age: 62 y.o. MRN: [id number]  CC: The primary encounter diagnosis was Numbness and tingling of both legs. Diagnoses of Impaired fasting blood sugar, Neuropathy, Vertigo, HYPERTENSION, BENIGN ESSENTIAL, Diabetes mellitus without complication ([place]), and Diabetic nephropathy with proteinuria ([place]) were also pertinent to this visit.  HPI MAYFIELD SCHOENE presents for evaluation of tingling and restless feeling in legs.  Has been present for over 8 months,  Started after his gout flare in February   Started on right lateral foot , then  spread to his toes bilaterally more on the left .  Then started having pain shooting up the right lateral leg and involving the plantar surface of his  left foot .   Symptoms are  intermittent   2)He has been having episodes of  Vertigo.   in July he went to ER MRI done  repeatedi in October. Has tried to exercise but situps caused recurrence of vertigo   Outpatient Medications Prior to Visit  Medication Sig Dispense Refill  . amLODipine (NORVASC) 5 MG tablet TAKE 1 TABLET (5 MG TOTAL) BY MOUTH DAILY. 30 tablet 3  . aspirin EC 81 MG tablet Take 1 tablet (81 mg total) by mouth daily. 90 tablet 3  . darunavir (PREZISTA) 800 MG tablet TAKE 1 TABLET (800 MG TOTAL) BY MOUTH DAILY. 30 tablet 11  . elvitegravir-cobicistat-emtricitabine-tenofovir (GENVOYA) 150-150-200-10 MG TABS tablet TAKE 1 TABLET BY MOUTH EVERY DAY WITH BREAKFAST 30 tablet 11  . febuxostat (ULORIC) 40 MG tablet TAKE 1 TABLET BY MOUTH EVERY DAY 90 tablet 0  . lisinopril (PRINIVIL,ZESTRIL) 20 MG tablet TAKE 1 TABLET BY MOUTH EVERY DAY 90 tablet 1  . pravastatin (PRAVACHOL) 40 MG tablet TAKE 1 TABLET BY MOUTH EVERY DAY 90 tablet 3  . meclizine (ANTIVERT) 25 MG tablet Take 1 tablet (25 mg total) by mouth 3 (three) times daily as needed for dizziness. 30 tablet 0   No facility-administered medications prior to visit.     Review of  Systems;  Patient denies headache, fevers, malaise, unintentional weight loss, skin rash, eye pain, sinus congestion and sinus pain, sore throat, dysphagia,  hemoptysis , cough, dyspnea, wheezing, chest pain, palpitations, orthopnea, edema, abdominal pain, nausea, melena, diarrhea, constipation, flank pain, dysuria, hematuria, urinary  Frequency, nocturia, numbness, tingling, seizures,  Focal weakness, Loss of consciousness,  Tremor, insomnia, depression, anxiety, and suicidal ideation.      Objective:  BP 122/86 (BP Location: Left Arm, Patient Position: Sitting, Cuff Size: Large)   Pulse 76   Temp 98.6 F (37 C) (Oral)   Resp 15   Ht 5\' 11"  (1.803 m)   Wt 248 lb 12.8 oz (112.9 kg)   SpO2 97%   BMI 34.70 kg/m   BP Readings from Last 3 Encounters:  01/10/18 122/86  11/26/17 (!) 147/97  10/04/17 131/84    Wt Readings from Last 3 Encounters:  01/10/18 248 lb 12.8 oz (112.9 kg)  11/26/17 242 lb (109.8 kg)  10/04/17 248 lb 3.2 oz (112.6 kg)    General appearance: alert, cooperative and appears stated age Ears: normal TM's and external ear canals both ears Throat: lips, mucosa, and tongue normal; teeth and gums normal Neck: no adenopathy, no carotid bruit, supple, symmetrical, trachea midline and thyroid not enlarged, symmetric, no tenderness/mass/nodules Back: symmetric, no curvature. ROM normal. No CVA tenderness. Lungs: clear to auscultation bilaterally Heart: regular rate and rhythm, S1, S2 normal, no murmur, click,  rub or gallop Abdomen: soft, non-tender; bowel sounds normal; no masses,  no organomegaly Pulses: 2+ and symmetric Skin: Skin color, texture, turgor normal. No rashes or lesions Lymph nodes: Cervical, supraclavicular, and axillary nodes normal. Neuro:  awake and interactive with normal mood and affect. Higher cortical functions are normal. Speech is clear without word-finding difficulty or dysarthria. Extraocular movements are intact. Visual fields of both eyes are  grossly intact. Sensation to light touch is grossly intact bilaterally of upper and lower extremities. Motor examination shows 4+/5 symmetric hand grip and upper extremity and 5/5 lower extremity strength. There is no pronation or drift. Gait is non-ataxic    Lab Results  Component Value Date   HGBA1C 6.5 01/10/2018   HGBA1C 6.4 (H) 08/19/2017   HGBA1C 5.3 04/04/2016    Lab Results  Component Value Date   CREATININE 1.19 11/06/2017   CREATININE 1.45 09/11/2017   CREATININE 1.28 (H) 08/19/2017    Lab Results  Component Value Date   WBC 7.5 11/06/2017   HGB 14.4 11/06/2017   HCT 40.4 11/06/2017   PLT 377 11/06/2017   GLUCOSE 114 (H) 11/06/2017   CHOL 173 11/06/2017   TRIG 136 11/06/2017   HDL 41 11/06/2017   LDLDIRECT 99 07/26/2015   LDLCALC 107 (H) 11/06/2017   ALT 52 (H) 11/06/2017   AST 42 (H) 11/06/2017   NA 138 11/06/2017   K 5.0 11/06/2017   CL 103 11/06/2017   CREATININE 1.19 11/06/2017   BUN 12 11/06/2017   CO2 26 11/06/2017   TSH 0.91 01/10/2018   PSA 0.65 03/15/2017   HGBA1C 6.5 01/10/2018   MICROALBUR 4.8 (H) 11/19/2014    No results found.  Assessment & Plan:   Problem List Items Addressed This Visit    Diabetes mellitus without complication ([place])     Historically well-controlled on diet alone .  hemoglobin A1c has been consistently at or  less than 7.0 . Patient is up-to-date on eye exams and foot exam is normal today. Patient has no microalbuminuria. Patient is tolerating statin therapy for CAD risk reduction and on ACE/ARB for renal protection and hypertension .  Lab Results  Component Value Date   HGBA1C 6.5 01/10/2018   Lab Results  Component Value Date   MICROALBUR 4.8 (H) 11/19/2014         Diabetic nephropathy with proteinuria ([facility])    Continue lisinopril and bp conrol,  Goal 120/70      HYPERTENSION, BENIGN ESSENTIAL    Well controlled on current regimen. Renal function stable, no changes today.  Lab Results  Component Value  Date   CREATININE 1.19 11/06/2017         Neuropathy    Screening for reversible causes. Has a history of neuropsyphilis.  He may need EMG nerve conduction  studies to eliminate causes  Vertigo    Recurrent with workup for CVA done this summer with MRI/MRA       Other Visit Diagnoses    Numbness and tingling of both legs    -  Primary   Relevant Orders   RPR (Completed)   Vitamin B12 (Completed)   RBC Folate (Completed)   TSH (Completed)   T4, free (Completed)   Impaired fasting blood sugar       Relevant Orders   Hemoglobin A1c (Completed)      I am having Ethel Rana maintain his amLODipine, lisinopril, aspirin EC, febuxostat, darunavir, elvitegravir-cobicistat-emtricitabine-tenofovir, pravastatin, and meclizine.  Meds ordered this encounter  Medications  . meclizine (ANTIVERT) 25 MG tablet    Sig: Take 1 tablet (25 mg total) by mouth 3 (three) times daily as needed for dizziness.    Dispense:  30 tablet    Refill:  2    Medications Discontinued During This Encounter  Medication Reason  . meclizine (ANTIVERT) 25 MG tablet Reorder    Follow-up: No follow-ups on file.   [name], MD

## 2018-01-11 ENCOUNTER — Encounter: Payer: Self-pay | Admitting: Internal Medicine

## 2018-01-11 DIAGNOSIS — I152 Hypertension secondary to endocrine disorders: Secondary | ICD-10-CM | POA: Insufficient documentation

## 2018-01-11 DIAGNOSIS — E669 Obesity, unspecified: Secondary | ICD-10-CM | POA: Insufficient documentation

## 2018-01-11 DIAGNOSIS — R809 Proteinuria, unspecified: Secondary | ICD-10-CM

## 2018-01-11 DIAGNOSIS — E1159 Type 2 diabetes mellitus with other circulatory complications: Secondary | ICD-10-CM | POA: Insufficient documentation

## 2018-01-11 DIAGNOSIS — E1129 Type 2 diabetes mellitus with other diabetic kidney complication: Secondary | ICD-10-CM | POA: Insufficient documentation

## 2018-01-11 DIAGNOSIS — E1165 Type 2 diabetes mellitus with hyperglycemia: Secondary | ICD-10-CM | POA: Insufficient documentation

## 2018-01-12 ENCOUNTER — Encounter: Payer: Self-pay | Admitting: Internal Medicine

## 2018-01-12 DIAGNOSIS — E1121 Type 2 diabetes mellitus with diabetic nephropathy: Secondary | ICD-10-CM | POA: Insufficient documentation

## 2018-01-12 DIAGNOSIS — G629 Polyneuropathy, unspecified: Secondary | ICD-10-CM | POA: Insufficient documentation

## 2018-01-12 NOTE — Assessment & Plan Note (Signed)
Historically well-controlled on diet alone .  hemoglobin A1c has been consistently at or  less than 7.0 . Patient is up-to-date on eye exams and foot exam is normal today. Patient has no microalbuminuria. Patient is tolerating statin therapy for CAD risk reduction and on ACE/ARB for renal protection and hypertension .  Lab Results  Component Value Date   HGBA1C 6.5 01/10/2018   Lab Results  Component Value Date   MICROALBUR 4.8 (H) 11/19/2014

## 2018-01-12 NOTE — Assessment & Plan Note (Addendum)
Screening for reversible causes. Has a history of neuropsyphilis.  He may need EMG nerve conduction  studies to eliminate causes

## 2018-01-12 NOTE — Assessment & Plan Note (Signed)
Well controlled on current regimen. Renal function stable, no changes today.  Lab Results  Component Value Date   CREATININE 1.19 11/06/2017

## 2018-01-12 NOTE — Assessment & Plan Note (Signed)
Recurrent with workup for CVA done this summer with MRI/MRA

## 2018-01-12 NOTE — Assessment & Plan Note (Signed)
Continue lisinopril and bp conrol,  Goal 120/70

## 2018-01-15 ENCOUNTER — Ambulatory Visit (INDEPENDENT_AMBULATORY_CARE_PROVIDER_SITE_OTHER): Payer: Medicare Other | Admitting: Family Medicine

## 2018-01-15 ENCOUNTER — Encounter: Payer: Self-pay | Admitting: Family Medicine

## 2018-01-15 VITALS — BP 122/80 | HR 72 | Temp 97.8°F | Ht 71.0 in | Wt 247.0 lb

## 2018-01-15 DIAGNOSIS — S29011A Strain of muscle and tendon of front wall of thorax, initial encounter: Secondary | ICD-10-CM

## 2018-01-15 DIAGNOSIS — X500XXA Overexertion from strenuous movement or load, initial encounter: Secondary | ICD-10-CM | POA: Diagnosis not present

## 2018-01-15 LAB — RPR: RPR Ser Ql: REACTIVE — AB

## 2018-01-15 LAB — FOLATE RBC: RBC Folate: 411 ng/mL RBC (ref >280)

## 2018-01-15 LAB — RPR TITER: RPR Titer: 1:2 {titer} — ABNORMAL HIGH

## 2018-01-15 LAB — FLUORESCENT TREPONEMAL AB(FTA)-IGG-BLD: Fluorescent Treponemal ABS: REACTIVE — AB

## 2018-01-15 NOTE — Progress Notes (Signed)
Subjective:    Patient ID: Adam Becker, male    DOB: 1955-02-16, 62 y.o.   MRN: [id number]  HPI  Presents to clinic c/o pain on right side under arm.  Patient states he was lifting heavy boxes, and believes this could have caused the pain.  Patient was unsure of what he could take to help with the pain, was talking with a friend last night and friend advised him to take a 600 mg dose of ibuprofen with some food.  Patient states when he woke up this morning the pain had resolved.  Denies any weakness in arms or legs, denies any chest pain/shortness of breath/palpitations.   Patient Active Problem List   Diagnosis Date Noted  . Neuropathy 01/12/2018  . Diabetic nephropathy with proteinuria ([place]) 01/12/2018  . Diabetes mellitus without complication ([place]) 69/67/8938  . Elevated liver function tests 11/27/2017  . Vertigo 09/11/2017  . Dizziness 09/11/2017  . TIA (transient ischemic attack) 08/18/2017  . Need for immunization against influenza 10/10/2016  . Routine screening for STI (sexually transmitted infection) 10/10/2016  . Urethritis 02/11/2015  . 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 Routine adult health maintenance 04/23/2014  . Allergic rhinitis due to allergen 04/23/2014  . 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 Gout attack 09/20/2012  . Obesity (BMI 30.0-34.9) 12/09/2011  . Hyperlipidemia 11/01/2009  . NEUROSYPHILIS 03/11/2009  . PARESTHESIA, HANDS 06/18/2008  . LATE SYPHILIS, LATENT 10/30/2007  . ERECTILE DYSFUNCTION, MILD 12/31/2006  . HYPERTENSION, BENIGN ESSENTIAL 10/15/2006  . Human immunodeficiency virus (HIV) disease ([place]) 04/16/2006   Social History   Tobacco Use  . Smoking status: Never Smoker  . Smokeless tobacco: Never Used  Substance Use Topics  . Alcohol use: No   Review of Systems   Constitutional: Negative for chills, fatigue and fever.  HENT: Negative for congestion, ear pain, sinus pain and sore  throat.   Eyes: Negative.   Respiratory: Negative for cough, shortness of breath and wheezing.   Cardiovascular: Negative for chest pain, palpitations and leg swelling.  Gastrointestinal: Negative for abdominal pain, diarrhea, nausea and vomiting.  Genitourinary: No dysuria, frequency or urgency.  Musculoskeletal: Right side pain under arm  Skin: Negative for color change, pallor and rash.  Neurological: Negative for syncope, light-headedness and headaches.  Psychiatric/Behavioral: The patient is not nervous/anxious.       Objective:   Physical Exam   Constitutional: He appears well-developed and well-nourished. No distress.  HENT:  Head: Normocephalic and atraumatic.  Eyes: Conjunctivae and EOM are normal. No scleral icterus.  Neck: Normal range of motion. Neck supple. No tracheal deviation present.  Cardiovascular: Normal rate, regular rhythm and normal heart sounds.  Pulmonary/Chest: Effort normal and breath sounds normal. No respiratory distress. He has no wheezes. He has no rales.  Abdominal: Soft. Bowel sounds are normal. There is no tenderness.  Neurological: He is alert and oriented to person, place, and time. Gait normal Musculoskeletal: Range of motion of upper and lower extremities intact.  Patient no longer has pain along right side of chest, area palpated and no pain induced.  Patient can bend upper body side to side, lean forward and backward and twist side to side without any issues. Skin: Skin is warm and dry. He is not diaphoretic. No pallor.  Psychiatric: He has a normal mood and affect. His behavior is normal.   Nursing note and vitals reviewed.    Vitals:   01/15/18 0828  BP: 122/80  Pulse: 72  Temp: 97.8 F (36.6 C)  SpO2: 94%   Assessment & Plan:   Muscle strain of chest wall, injury caused by lifting - pain resolved with taking ibuprofen.  Patient advised to monitor for any recurrence of the pain and to let us know if it does come back.  Patient advised  he can use ibuprofen on occasion if he has any muscle or joint type pains, he is advised to be sure to take ibuprofen with food.  Also advised he can use Tylenol as needed for pain.  Educated that in small doses & short periods of time, ibuprofen and Tylenol both can be quite effective for pain control.  Patient will keep regularly scheduled follow-up with PCP as planned.  Advised to return to clinic sooner if any issues arise.

## 2018-02-07 ENCOUNTER — Encounter: Payer: Self-pay | Admitting: Internal Medicine

## 2018-02-07 ENCOUNTER — Ambulatory Visit (INDEPENDENT_AMBULATORY_CARE_PROVIDER_SITE_OTHER): Payer: Medicare Other | Admitting: Internal Medicine

## 2018-02-07 VITALS — BP 118/70 | HR 71 | Temp 97.1°F | Resp 15 | Ht 71.0 in | Wt 243.6 lb

## 2018-02-07 DIAGNOSIS — I129 Hypertensive chronic kidney disease with stage 1 through stage 4 chronic kidney disease, or unspecified chronic kidney disease: Secondary | ICD-10-CM | POA: Diagnosis not present

## 2018-02-07 DIAGNOSIS — E119 Type 2 diabetes mellitus without complications: Secondary | ICD-10-CM | POA: Diagnosis not present

## 2018-02-07 DIAGNOSIS — N182 Chronic kidney disease, stage 2 (mild): Secondary | ICD-10-CM | POA: Diagnosis not present

## 2018-02-07 DIAGNOSIS — G8929 Other chronic pain: Secondary | ICD-10-CM

## 2018-02-07 DIAGNOSIS — E1129 Type 2 diabetes mellitus with other diabetic kidney complication: Secondary | ICD-10-CM | POA: Diagnosis not present

## 2018-02-07 DIAGNOSIS — R109 Unspecified abdominal pain: Secondary | ICD-10-CM | POA: Diagnosis not present

## 2018-02-07 DIAGNOSIS — E1121 Type 2 diabetes mellitus with diabetic nephropathy: Secondary | ICD-10-CM | POA: Diagnosis not present

## 2018-02-07 DIAGNOSIS — R809 Proteinuria, unspecified: Secondary | ICD-10-CM

## 2018-02-07 LAB — URINALYSIS, ROUTINE W REFLEX MICROSCOPIC
Bilirubin Urine: NEGATIVE
Hgb urine dipstick: NEGATIVE
Ketones, ur: NEGATIVE
Leukocytes, UA: NEGATIVE
Nitrite: NEGATIVE
RBC / HPF: NONE SEEN (ref 0–?)
Specific Gravity, Urine: 1.015 (ref 1.000–1.030)
Total Protein, Urine: NEGATIVE
Urine Glucose: NEGATIVE
Urobilinogen, UA: 0.2 (ref 0.0–1.0)
pH: 5.5 (ref 5.0–8.0)

## 2018-02-07 LAB — MICROALBUMIN / CREATININE URINE RATIO
Creatinine,U: 85.9 mg/dL
Microalb Creat Ratio: 5.3 mg/g (ref 0.0–30.0)
Microalb, Ur: 4.6 mg/dL — ABNORMAL HIGH (ref 0.0–1.9)

## 2018-02-07 NOTE — Patient Instructions (Signed)
We will repeat your A1c in June  When you return    Low carb Breakfast options:  Premier Protein  Atkins Advantage Muscle Milk EAS AdvantEdge   All of these are available at [employer], Vladimir Faster,  Bandana a And taste good   Danton Clap "[employer]'Light" frozen entrees:    Frittata  : muffin shaped quiches that contain eggs + cheese + veggies+ meat (no bread) Egg'wich :  [place] sausage patty served on a biscuit made of frittat (no bread)   Both can be microwaved in 2 minutes   Truett Perna:  Just Crack n Egg: yogurt sized cup ith diced ham, cheese and potatoes: add one egg and microwave for 2 minutes   For lunch:   [employer] bagged salads:  Try  the Asian, the SouthWestern,  And the Caesar salads, complete with dressings, nuts and croutons .  Found  in the bagged salad section   Just add a protein (tuna,  Chicken etc ) and omit the won ton /tortilla strips   Diabetes Mellitus and Standards of Medical Care Managing diabetes (diabetes mellitus) can be complicated. Your diabetes treatment may be managed by a team of health care providers, including:  A physician who specializes in diabetes (endocrinologist).  A nurse practitioner or physician assistant.  Nurses.  A diet and nutrition specialist (registered dietitian).  A certified diabetes educator (CDE).  An exercise specialist.  A pharmacist.  An eye doctor.  A foot specialist (podiatrist).  A dentist.  A primary care provider.  A mental health provider. Your health care providers follow guidelines to help you get the best quality of care. The following schedule is a general guideline for your diabetes management plan. Your health care providers may give you more specific instructions. Physical exams Upon being diagnosed with diabetes mellitus, and each year after that, your health care provider will ask about your medical and family history. He or she will also do a physical exam. Your exam may  include:  Measuring your height, weight, and body mass index (BMI).  Checking your blood pressure. This will be done at every routine medical visit. Your target blood pressure may vary depending on your medical conditions, your age, and other factors.  Thyroid gland exam.  Skin exam.  Screening for damage to your nerves (peripheral neuropathy). This may include checking the pulse in your legs and feet and checking the level of sensation in your hands and feet.  A complete foot exam to inspect the structure and skin of your feet, including checking for cuts, bruises, redness, blisters, sores, or other problems.  Screening for blood vessel (vascular) problems, which may include checking the pulse in your legs and feet and checking your temperature. Blood tests Depending on your treatment plan and your personal needs, you may have the following tests done:  HbA1c (hemoglobin A1c). This test provides information about blood sugar (glucose) control over the previous 2-3 months. It is used to adjust your treatment plan, if needed. This test will be done: ? At least 2 times a year, if you are meeting your treatment goals. ? 4 times a year, if you are not meeting your treatment goals or if treatment goals have changed.  Lipid testing, including total, LDL, and HDL cholesterol and triglyceride levels. ? The goal for LDL is less than 100 mg/dL (5.5 mmol/L). If you are at high risk for complications, the goal is less than 70 mg/dL (3.9 mmol/L). ? The goal for  HDL is 40 mg/dL (2.2 mmol/L) or higher for men and 50 mg/dL (2.8 mmol/L) or higher for women. An HDL cholesterol of 60 mg/dL (3.3 mmol/L) or higher gives some protection against heart disease. ? The goal for triglycerides is less than 150 mg/dL (8.3 mmol/L).  Liver function tests.  Kidney function tests.  Thyroid function tests. Dental and eye exams  Visit your dentist two times a year.  If you have type 1 diabetes, your health care  provider may recommend an eye exam 3-5 years after you are diagnosed, and then once a year after your first exam. ? For children with type 1 diabetes, a health care provider may recommend an eye exam when your child is age 19 or older and has had diabetes for 3-5 years. After the first exam, your child should get an eye exam once a year.  If you have type 2 diabetes, your health care provider may recommend an eye exam as soon as you are diagnosed, and then once a year after your first exam. Immunizations   The yearly flu (influenza) vaccine is recommended for everyone 6 months or older who has diabetes.  The pneumonia (pneumococcal) vaccine is recommended for everyone 2 years or older who has diabetes. If you are 34 or older, you may get the pneumonia vaccine as a series of two separate shots.  The hepatitis B vaccine is recommended for adults shortly after being diagnosed with diabetes.  Adults and children with diabetes should receive all other vaccines according to age-specific recommendations from the Centers for Disease Control and Prevention (CDC). Mental and emotional health Screening for symptoms of eating disorders, anxiety, and depression is recommended at the time of diagnosis and afterward as needed. If your screening shows that you have symptoms (positive screening result), you may need more evaluation and you may work with a mental health care provider. Treatment plan Your treatment plan will be reviewed at every medical visit. You and your health care provider will discuss:  How you are taking your medicines, including insulin.  Any side effects you are experiencing.  Your blood glucose target goals.  The frequency of your blood glucose monitoring.  Lifestyle habits, such as activity level as well as tobacco, alcohol, and substance use. Diabetes self-management education Your health care provider will assess how well you are monitoring your blood glucose levels and whether  you are taking your insulin correctly. He or she may refer you to:  A certified diabetes educator to manage your diabetes throughout your life, starting at diagnosis.  A registered dietitian who can create or review your personal nutrition plan.  An exercise specialist who can discuss your activity level and exercise plan. Summary  Managing diabetes (diabetes mellitus) can be complicated. Your diabetes treatment may be managed by a team of health care providers.  Your health care providers follow guidelines in order to help you get the best quality of care.  Standards of care including having regular physical exams, blood tests, blood pressure monitoring, immunizations, screening tests, and education about how to manage your diabetes.  Your health care providers may also give you more specific instructions based on your individual health. This information is not intended to replace advice given to you by your health care provider. Make sure you discuss any questions you have with your health care provider. Document Released: 11/06/2008 Document Revised: 09/28/2017 Document Reviewed: 10/08/2015 Elsevier Interactive Patient Education  2019 [employer].

## 2018-02-07 NOTE — Progress Notes (Signed)
Subjective:  Patient ID: Adam Becker, male    DOB: 1955/04/27  Age: 63 y.o. MRN: [id number]  CC: The primary encounter diagnosis was Diabetes mellitus without complication ([facility]). 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  HPI Adam Becker presents for EVALUATION AND TREATMENT OF TYPE 2 DM diagnosed last month with A1c 6.5.   he has been "prediabetic"  for at least 2 years   Since his diagnosis he has cut out sweet tea and candy .  Cut back on biscuits. Cut out desserts. .  Cooking more , eating more fish and vegetables. Down 5 lbs and 2 sizes I n pats  Cc:  Has been having right sided thoracic back  Discomfort which has been  intermittent last for 5-6 days. . Not related to activity , not sharp or stabbing  Feels more like a "drawing" accompanied by a warm sensation .  No blood in urine, change in BM's.  No fevers or body aches.   Outpatient Medications Prior to Visit  Medication Sig Dispense Refill  . amLODipine (NORVASC) 5 MG tablet TAKE 1 TABLET (5 MG TOTAL) BY MOUTH DAILY. 30 tablet 3  . aspirin EC 81 MG tablet Take 1 tablet (81 mg total) by mouth daily. 90 tablet 3  . darunavir (PREZISTA) 800 MG tablet TAKE 1 TABLET (800 MG TOTAL) BY MOUTH DAILY. 30 tablet 11  . elvitegravir-cobicistat-emtricitabine-tenofovir (GENVOYA) 150-150-200-10 MG TABS tablet TAKE 1 TABLET BY MOUTH EVERY DAY WITH BREAKFAST 30 tablet 11  . febuxostat (ULORIC) 40 MG tablet TAKE 1 TABLET BY MOUTH EVERY DAY 90 tablet 0  . lisinopril (PRINIVIL,ZESTRIL) 20 MG tablet TAKE 1 TABLET BY MOUTH EVERY DAY 90 tablet 1  . meclizine (ANTIVERT) 25 MG tablet Take 1 tablet (25 mg total) by mouth 3 (three) times daily as needed for dizziness. 30 tablet 2  . pravastatin (PRAVACHOL) 40 MG tablet TAKE 1 TABLET BY  MOUTH EVERY DAY 90 tablet 3   No facility-administered medications prior to visit.     Review of Systems;  Patient denies headache, fevers, malaise, unintentional weight loss, skin rash, eye pain, sinus congestion and sinus pain, sore throat, dysphagia,  hemoptysis , cough, dyspnea, wheezing, chest pain, palpitations, orthopnea, edema, abdominal pain, nausea, melena, diarrhea, constipation, flank pain, dysuria, hematuria, urinary  Frequency, nocturia, numbness, tingling, seizures,  Focal weakness, Loss of consciousness,  Tremor, insomnia, depression, anxiety, and suicidal ideation.      Objective:  BP 118/70 (BP Location: Left Arm, Patient Position: Sitting, Cuff Size: Large)   Pulse 71   Temp (!) 97.1 F (36.2 C) (Oral)   Resp 15   Ht 5\' 11"  (1.803 m)   Wt 243 lb 9.6 oz (110.5 kg)   SpO2 96%   BMI 33.98 kg/m   BP Readings from Last 3 Encounters:  02/07/18 118/70  01/15/18 122/80  01/10/18 122/86    Wt Readings from Last 3 Encounters:  02/07/18 243 lb 9.6 oz (110.5 kg)  01/15/18 247 lb (112 kg)  01/10/18 248 lb 12.8 oz (112.9 kg)    General appearance: alert, cooperative and appears stated age Ears: normal TM's and external ear canals both ears Throat: lips, mucosa, and tongue normal; teeth and gums normal Neck: no adenopathy, no carotid bruit, supple, symmetrical, trachea midline and thyroid not enlarged, symmetric, no tenderness/mass/nodules Back: symmetric,  no curvature. ROM normal. No CVA tenderness. Lungs: clear to auscultation bilaterally Heart: regular rate and rhythm, S1, S2 normal, no murmur, click, rub or gallop Abdomen: soft, non-tender; bowel sounds normal; no masses,  no organomegaly Pulses: 2+ and symmetric Skin: Skin color, texture, turgor normal. No rashes or lesions Lymph nodes: Cervical, supraclavicular, and axillary nodes normal.  Lab Results  Component Value Date   HGBA1C 6.5 01/10/2018   HGBA1C 6.4 (H) 08/19/2017   HGBA1C 5.3 04/04/2016     Lab Results  Component Value Date   CREATININE 1.19 11/06/2017   CREATININE 1.45 09/11/2017   CREATININE 1.28 (H) 08/19/2017    Lab Results  Component Value Date   WBC 7.5 11/06/2017   HGB 14.4 11/06/2017   HCT 40.4 11/06/2017   PLT 377 11/06/2017   GLUCOSE 114 (H) 11/06/2017   CHOL 173 11/06/2017   TRIG 136 11/06/2017   HDL 41 11/06/2017   LDLDIRECT 99 07/26/2015   LDLCALC 107 (H) 11/06/2017   ALT 52 (H) 11/06/2017   AST 42 (H) 11/06/2017   NA 138 11/06/2017   K 5.0 11/06/2017   CL 103 11/06/2017   CREATININE 1.19 11/06/2017   BUN 12 11/06/2017   CO2 26 11/06/2017   TSH 0.91 01/10/2018   PSA 0.65 03/15/2017   HGBA1C 6.5 01/10/2018   MICROALBUR 4.6 (H) 02/07/2018    No results found.  Assessment & Plan:   Problem List Items Addressed This Visit    Chronic kidney disease (CKD), stage II (mild)    Resolved,  Secondary to acute tubular necrosis with severe metabolic acidosis in November 2013 requiring admission to [facility]. creatinine was 11 on admission and 3 at discharge. He was referred to nephrology after discharge. 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  hemoglobin A1c has been consistently at or  less than 7.0 . Patient is up-to-date on eye exams and foot exam is normal today. Patient has early  microalbuminuria. Patient is tolerating statin therapy for CAD risk reduction, low dose aspirin, and  ACE inhibitor for  renal protection and hypertension .  Lab Results  Component Value Date   HGBA1C 6.5 01/10/2018   Lab Results  Component Value Date   MICROALBUR 4.6 (H) 02/07/2018         Diabetic nephropathy with proteinuria ([facility])    Continue lisinopril and bp control,  Goal  120/70.  Lab Results  Component Value Date   MICROALBUR 4.6 (H) 02/07/2018        Nephropathy hypertensive    Unchanged since 2016.  Continue lisinopril  Lab Results  Component Value Date   CREATININE 1.19 11/06/2017   Lab Results  Component Value Date   NA 138 11/06/2017   K 5.0 11/06/2017   CL 103 11/06/2017   CO2 26 11/06/2017   Lab Results  Component Value Date   MICROALBUR 4.6 (H) 02/07/2018         Right flank pain    Likely MSK . UA is without hematuria , pyuria or proteinuria        Other Visit Diagnoses    Chronic right flank pain  Relevant Orders   Urinalysis, Routine w reflex microscopic (Completed)    A total of 25 minutes of face to face time was spent with patient more than half of which was spent in counselling about the above mentioned conditions  and coordination of care   I am having Adam Becker maintain his amLODipine, lisinopril, aspirin EC, febuxostat, darunavir, elvitegravir-cobicistat-emtricitabine-tenofovir, pravastatin, and meclizine.  No orders of the defined types were placed in this encounter.   There are no discontinued medications.  Follow-up: Return in about 5 months (around 06/24/2018) for follow up diabetes.   [name], MD

## 2018-02-09 ENCOUNTER — Encounter: Payer: Self-pay | Admitting: Internal Medicine

## 2018-02-09 DIAGNOSIS — R109 Unspecified abdominal pain: Secondary | ICD-10-CM | POA: Insufficient documentation

## 2018-02-09 NOTE — Assessment & Plan Note (Signed)
Resolved,  Secondary to acute tubular necrosis with severe metabolic acidosis in November 2013 requiring admission to [facility]. creatinine was 11 on admission and 3 at discharge. He was referred to nephrology after discharge. CR has been stable   Lab Results  Component Value Date   CREATININE 1.19 11/06/2017   Lab Results  Component Value Date   NA 138 11/06/2017   K 5.0 11/06/2017   CL 103 11/06/2017   CO2 26 11/06/2017

## 2018-02-09 NOTE — Assessment & Plan Note (Signed)
Historically well-controlled on diet alone .  hemoglobin A1c has been consistently at or  less than 7.0 . Patient is up-to-date on eye exams and foot exam is normal today. Patient has early  microalbuminuria. Patient is tolerating statin therapy for CAD risk reduction, low dose aspirin, and  ACE inhibitor for  renal protection and hypertension .  Lab Results  Component Value Date   HGBA1C 6.5 01/10/2018   Lab Results  Component Value Date   MICROALBUR 4.6 (H) 02/07/2018

## 2018-02-09 NOTE — Assessment & Plan Note (Signed)
Unchanged since 2016.  Continue lisinopril  Lab Results  Component Value Date   CREATININE 1.19 11/06/2017   Lab Results  Component Value Date   NA 138 11/06/2017   K 5.0 11/06/2017   CL 103 11/06/2017   CO2 26 11/06/2017   Lab Results  Component Value Date   MICROALBUR 4.6 (H) 02/07/2018

## 2018-02-09 NOTE — Assessment & Plan Note (Signed)
Continue lisinopril and bp control,  Goal 120/70.  Lab Results  Component Value Date   MICROALBUR 4.6 (H) 02/07/2018

## 2018-02-09 NOTE — Assessment & Plan Note (Signed)
Likely MSK . UA is without hematuria , pyuria or proteinuria

## 2018-02-13 ENCOUNTER — Other Ambulatory Visit: Payer: Self-pay | Admitting: Internal Medicine

## 2018-02-20 ENCOUNTER — Other Ambulatory Visit: Payer: Self-pay | Admitting: Internal Medicine

## 2018-03-11 ENCOUNTER — Telehealth: Payer: Self-pay | Admitting: Internal Medicine

## 2018-03-11 NOTE — Telephone Encounter (Signed)
FYI Called Adam Becker. Having rt thigh discomfort intermittent since late September.  Episodes lasts about 7 days and then goes away.  Worsen with activity.  Adam Becker mentioned he noticed running on treadmill triggers.  Once discomfort starts Adam Becker becomes nauseous.  Confirmed no other acute symptoms.  No swelling in leg.  Adam Becker aware that Dr. Derrel Nip is out of the office all week and MRI cannot be done w/o appt.  Scheduled Adam Becker w/ NP for Thursday and advised if any acute or worsening symptoms occurs should be evaluated sooner.    Sending this as an FYI since he is on your schedule for Thursday

## 2018-03-11 NOTE — Telephone Encounter (Signed)
Copied from [place] [id number]. Topic: Quick Communication - See Telephone Encounter >> Mar 11, 2018  9:36 AM Rutherford Nail, NT wrote: CRM for notification. See Telephone encounter for: 03/11/18. Patient calling and states that he has spoken with Dr Derrel Nip in the past regarding burning in his right thigh. States that it has been going on "for a while" now. States that it gets worse when he is on the tread mill. States that it makes him nauseous and hot all over. Would like to know if Dr Derrel Nip could order an MRI or something to look into this. Please advise.  CB#:[phone]

## 2018-03-11 NOTE — Telephone Encounter (Signed)
Called pt. Having rt thigh discomfort intermittent since late September.  Episodes lasts about 7 days and then goes away.  Worsen with activity.  Pt mentioned he noticed running on treadmill triggers.  Once discomfort starts pt becomes nauseous.  Confirmed no other acute symptoms.  No swelling in leg.  Pt aware that Dr. Derrel Nip is out of the office all week and MRI cannot be done w/o appt.  Scheduled pt w/ NP for Thursday and advised if any acute or worsening symptoms occurs should be evaluated sooner.    Sending this as an FYI since he is on your schedule for Thursday.

## 2018-03-14 ENCOUNTER — Ambulatory Visit (INDEPENDENT_AMBULATORY_CARE_PROVIDER_SITE_OTHER): Payer: Medicare Other

## 2018-03-14 ENCOUNTER — Encounter: Payer: Self-pay | Admitting: Family Medicine

## 2018-03-14 ENCOUNTER — Ambulatory Visit (INDEPENDENT_AMBULATORY_CARE_PROVIDER_SITE_OTHER): Payer: Medicare Other | Admitting: Family Medicine

## 2018-03-14 VITALS — BP 114/76 | HR 81 | Temp 98.1°F | Resp 16 | Ht 71.0 in | Wt 238.4 lb

## 2018-03-14 DIAGNOSIS — M545 Low back pain, unspecified: Secondary | ICD-10-CM

## 2018-03-14 DIAGNOSIS — R1031 Right lower quadrant pain: Secondary | ICD-10-CM | POA: Diagnosis not present

## 2018-03-14 DIAGNOSIS — M47816 Spondylosis without myelopathy or radiculopathy, lumbar region: Secondary | ICD-10-CM | POA: Diagnosis not present

## 2018-03-14 NOTE — Progress Notes (Signed)
Subjective:    Patient ID: Adam Becker, male    DOB: [birth year], 63 y.o.   MRN: [id number]  HPI   Patient presents to clinic complaining of pain on right lower thorax area body.  Denies any pain in lumbar spine, down into buttocks or down into leg.  States he notices pain on the right side when he goes to lean towards the left or twist towards the left, and will be a pulling pain on the right side.  Denies any upper extremity or lower extremity numbness.  Denies any saddle anesthesia.  Denies any loss of bowel or bladder control.  Patient states he has had his urine checked and it was normal, so suspected kidney stone was ruled out.  Denies any nausea, vomiting or diarrhea.  Denies any issues urinating.  Denies any issues with bowel movements.  Denies any abdominal pain.  Denies chest pain, shortness of breath or wheezing.  States his sister passed away from bone cancer, so whenever he has a pain he does become nervous and always wonders what it is.  Patient Active Problem List   Diagnosis Date Noted  . Right flank pain 02/09/2018  . Neuropathy 01/12/2018  . Diabetic nephropathy with proteinuria ([place]) 01/12/2018  . 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 Elevated liver function tests 11/27/2017  . Vertigo 09/11/2017  . TIA (transient ischemic attack) 08/18/2017  . Need for immunization against influenza 10/10/2016  . Routine screening for STI (sexually transmitted infection) 10/10/2016  . 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 Routine adult health maintenance 04/23/2014  . Allergic rhinitis due to allergen 04/23/2014  . 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 Gout attack 09/20/2012  . Obesity (BMI 30.0-34.9) 12/09/2011  . Hyperlipidemia 11/01/2009  . NEUROSYPHILIS 03/11/2009  . PARESTHESIA, HANDS 06/18/2008  . LATE SYPHILIS, LATENT 10/30/2007  . ERECTILE DYSFUNCTION, MILD 12/31/2006  . HYPERTENSION, BENIGN  ESSENTIAL 10/15/2006  . Human immunodeficiency virus (HIV) disease ([place]) 04/16/2006   Social History   Tobacco Use  . Smoking status: Never Smoker  . Smokeless tobacco: Never Used  Substance Use Topics  . Alcohol use: No   Review of Systems  Constitutional: Negative for chills, fatigue and fever.  HENT: Negative for congestion, ear pain, sinus pain and sore throat.   Eyes: Negative.   Respiratory: Negative for cough, shortness of breath and wheezing.   Cardiovascular: Negative for chest pain, palpitations and leg swelling.  Gastrointestinal: Negative for abdominal pain, diarrhea, nausea and vomiting.  Genitourinary: Negative for dysuria, frequency and urgency.  Musculoskeletal: Pain on right side Skin: Negative for color change, pallor and rash.  Neurological: Negative for syncope, light-headedness and headaches.  Psychiatric/Behavioral: The patient is not nervous/anxious.       Objective:   Physical Exam Vitals signs and nursing note reviewed.  Constitutional:      General: He is not in acute distress.    Appearance: He is not ill-appearing or toxic-appearing.  HENT:     Head: Normocephalic and atraumatic.  Eyes:     General: No scleral icterus.    Extraocular Movements: Extraocular movements intact.     Conjunctiva/sclera: Conjunctivae normal.     Pupils: Pupils are equal, round, and reactive to light.  Neck:     Musculoskeletal: Normal range of motion and neck supple. No neck rigidity.  Cardiovascular:     Rate and Rhythm: Normal rate and regular rhythm.     Heart sounds: Normal heart sounds.  Pulmonary:     Effort: Pulmonary effort is normal. No respiratory distress.     Breath sounds: Normal breath sounds.  Abdominal:     General: Bowel sounds are normal. There is no distension.     Palpations: Abdomen is soft. There is no mass.     Tenderness: There is no abdominal tenderness. There is no right CVA tenderness, left CVA tenderness, guarding or rebound.      Hernia: No hernia is present.  Musculoskeletal:        General: No deformity.       Back:     Right lower leg: No edema.     Left lower leg: No edema.     Comments: Location of pain indicated by red shape on diagram. It is not low back, nor ABD pain. It is more on the right side of thorax.  ROM of lumbar spine is normal. Grips and quadriceps equal and strong.   Skin:    General: Skin is warm and dry.     Coloration: Skin is not jaundiced or pale.     Findings: No bruising or erythema.  Neurological:     Mental Status: He is alert and oriented to person, place, and time.     Cranial Nerves: No cranial nerve deficit.     Motor: No weakness.     Gait: Gait normal.  Psychiatric:        Mood and Affect: Mood normal.        Behavior: Behavior normal.     Vitals:   03/14/18 1422  BP: 114/76  Pulse: 81  Resp: 16  Temp: 98.1 F (36.7 C)  SpO2: 95%      Assessment & Plan:   A total of 25  minutes were spent face-to-face with the patient during this encounter and over half of that time was spent on counseling and coordination of care. The patient was counseled on possible causes of his pain, plan for labs and xray.    Right side pain-patient's exam is unremarkable.  He appears nontoxic.  Labs will be drawn today including CBC and CMP to further investigate.  Vital signs are stable.  We will get x-ray of lumbar spine and abdomen to further investigate patient's pain.  Patient advised to put a heating pad on sore area and also to try topical rubs such as BenGay or Biofreeze to soothe pain if it occurs.   Patient aware he will be called with results when they are available.  Otherwise he will follow-up with PCP as already scheduled and return to clinic sooner if any issues arise.

## 2018-03-15 LAB — COMPREHENSIVE METABOLIC PANEL
AG Ratio: 1.1 (calc) (ref 1.0–2.5)
ALT: 34 U/L (ref 9–46)
AST: 34 U/L (ref 10–35)
Albumin: 4.5 g/dL (ref 3.6–5.1)
Alkaline phosphatase (APISO): 51 U/L (ref 35–144)
BUN/Creatinine Ratio: 13 (calc) (ref 6–22)
BUN: 17 mg/dL (ref 7–25)
CO2: 23 mmol/L (ref 20–32)
Calcium: 10.2 mg/dL (ref 8.6–10.3)
Chloride: 103 mmol/L (ref 98–110)
Creat: 1.35 mg/dL — ABNORMAL HIGH (ref 0.70–1.25)
Globulin: 4.2 g/dL (calc) — ABNORMAL HIGH (ref 1.9–3.7)
Glucose, Bld: 94 mg/dL (ref 65–99)
Potassium: 4.3 mmol/L (ref 3.5–5.3)
Sodium: 138 mmol/L (ref 135–146)
Total Bilirubin: 0.3 mg/dL (ref 0.2–1.2)
Total Protein: 8.7 g/dL — ABNORMAL HIGH (ref 6.1–8.1)

## 2018-03-15 LAB — CBC
HCT: 41.7 % (ref 38.5–50.0)
Hemoglobin: 14.9 g/dL (ref 13.2–17.1)
MCH: 33.3 pg — ABNORMAL HIGH (ref 27.0–33.0)
MCHC: 35.7 g/dL (ref 32.0–36.0)
MCV: 93.3 fL (ref 80.0–100.0)
MPV: 9.6 fL (ref 7.5–12.5)
Platelets: 400 10*3/uL (ref 140–400)
RBC: 4.47 10*6/uL (ref 4.20–5.80)
RDW: 12.7 % (ref 11.0–15.0)
WBC: 8.3 10*3/uL (ref 3.8–10.8)

## 2018-03-19 MED ORDER — ALLOPURINOL 100 MG PO TABS: ORAL_TABLET | ORAL | 1 refills | Status: DC

## 2018-03-19 NOTE — Telephone Encounter (Signed)
Medication has been resent to pharmacy.  

## 2018-03-19 NOTE — Telephone Encounter (Signed)
Patient states he spoke with his insurance and was advised allopurinol 100mg  would be covered, as per chart rx was sent yesterday but E-Prescribing Status: Transmission to pharmacy failed (03/18/2018 4:55 PM EST), please re send  [id number] - [place], [place] - [address] [phone] (Phone) [phone] (Fax)

## 2018-03-19 NOTE — Addendum Note (Signed)
Addended by: Adair Laundry on: 03/19/2018 11:15 AM   Modules accepted: Orders

## 2018-05-20 ENCOUNTER — Other Ambulatory Visit: Payer: Self-pay | Admitting: Internal Medicine

## 2018-05-20 MED ORDER — TELMISARTAN 40 MG PO TABS: 40.0000 mg | ORAL_TABLET | Freq: Every day | ORAL | 5 refills | Status: DC

## 2018-05-20 NOTE — Telephone Encounter (Signed)
Please notify patient that   I am making a decision to change his blood pressure medication  to telmisartan, based on increased reports by one of my ENT colleagues of patients  developing tongue and throat swelling from lisinopril and benazepril .  The condition , called "angioedema," can be fatal if a person's airway is compromised.  I also want you to take it at night instead of morning,  s recent studies have shown that taking your blood pressure medications at night protects you better from heart attacks and strokes.   Regards,   Deborra Medina, MD

## 2018-05-20 NOTE — Telephone Encounter (Signed)
Sent to PCP for an alternative medication is on back order at the pharmacy

## 2018-05-21 ENCOUNTER — Telehealth: Payer: Self-pay | Admitting: Internal Medicine

## 2018-05-21 NOTE — Telephone Encounter (Signed)
LMTCB. PEC may speak with pt.  

## 2018-05-21 NOTE — Telephone Encounter (Signed)
Pt returned call and message given to him form Dr. Derrel Nip regarding changing his blood pressure medication to telmisartan.  I went over the information regarding the reason behind changing his b/p medication. Also and to take this new medication at night. Pt voiced understanding.

## 2018-05-22 NOTE — Telephone Encounter (Signed)
PEC spoke with pt in regards to medication change.

## 2018-08-08 ENCOUNTER — Ambulatory Visit (INDEPENDENT_AMBULATORY_CARE_PROVIDER_SITE_OTHER): Payer: Medicare Other

## 2018-08-08 ENCOUNTER — Encounter: Payer: Self-pay | Admitting: Internal Medicine

## 2018-08-08 ENCOUNTER — Other Ambulatory Visit: Payer: Self-pay

## 2018-08-08 ENCOUNTER — Ambulatory Visit (INDEPENDENT_AMBULATORY_CARE_PROVIDER_SITE_OTHER): Payer: Medicare Other | Admitting: Internal Medicine

## 2018-08-08 ENCOUNTER — Telehealth: Payer: Self-pay

## 2018-08-08 VITALS — Ht 71.0 in | Wt 238.0 lb

## 2018-08-08 DIAGNOSIS — E1129 Type 2 diabetes mellitus with other diabetic kidney complication: Secondary | ICD-10-CM

## 2018-08-08 DIAGNOSIS — I10 Essential (primary) hypertension: Secondary | ICD-10-CM | POA: Diagnosis not present

## 2018-08-08 DIAGNOSIS — Z800 Family history of malignant neoplasm of digestive organs: Secondary | ICD-10-CM

## 2018-08-08 DIAGNOSIS — Z125 Encounter for screening for malignant neoplasm of prostate: Secondary | ICD-10-CM

## 2018-08-08 DIAGNOSIS — R809 Proteinuria, unspecified: Secondary | ICD-10-CM

## 2018-08-08 DIAGNOSIS — N182 Chronic kidney disease, stage 2 (mild): Secondary | ICD-10-CM | POA: Diagnosis not present

## 2018-08-08 DIAGNOSIS — B20 Human immunodeficiency virus [HIV] disease: Secondary | ICD-10-CM

## 2018-08-08 DIAGNOSIS — A523 Neurosyphilis, unspecified: Secondary | ICD-10-CM

## 2018-08-08 DIAGNOSIS — Z0000 Encounter for general adult medical examination without abnormal findings: Secondary | ICD-10-CM | POA: Diagnosis not present

## 2018-08-08 NOTE — Telephone Encounter (Signed)
Called pt to schedule fasting lab appt and he wanted to let you know that his bp was 125/78 when he checked it today.

## 2018-08-08 NOTE — Patient Instructions (Addendum)
  [name] , Thank you for taking time to come for your Medicare Wellness Visit. I appreciate your ongoing commitment to your health goals. Please review the following plan we discussed and let me know if I can assist you in the future.   These are the goals we discussed: Goals      Patient Stated   . Obtain Annual Eye (retinal)  Exam  (pt-stated)     Diabetic eye exam      Other   . Low Carb Foods     Portion Control        This is a list of the screening recommended for you and due dates:  Health Maintenance  Topic Date Due  . Eye exam for diabetics  10/03/1966  . Tetanus Vaccine  10/03/1975  . Hemoglobin A1C  07/12/2018  . Flu Shot  08/24/2018  . Colon Cancer Screening  11/08/2018  . Complete foot exam   02/08/2019  . Pneumococcal vaccine  Completed  .  Hepatitis C: One time screening is recommended by Center for Disease Control  (CDC) for  adults born from [birth year] through 1964.   Completed  . HIV Screening  Completed

## 2018-08-08 NOTE — Progress Notes (Signed)
Telephone  Note  This visit type was conducted due to national recommendations for restrictions regarding the COVID-19 pandemic (e.g. social distancing).  This format is felt to be most appropriate for this patient at this time.  All issues noted in this document were discussed and addressed.  No physical exam was performed (except for noted visual exam findings with Video Visits).   I connected with@ on 08/10/18 at  9:00 AM EDT by  telephone and verified that I am speaking with the correct person using two identifiers. Location patient: home Location provider: work or home office Persons participating in the virtual visit: patient, provider  I discussed the limitations, risks, security and privacy concerns of performing an evaluation and management service by telephone and the availability of in person appointments. I also discussed with the patient that there may be a patient responsible charge related to this service. The patient expressed understanding and agreed to proceed.  Reason for visit:  Diabetes follow up HPI:   63 yr old male with T2DM diagnosed in Dec by A1c of 6.5. Patient does not check blood sugars.  He is not  exercising on a regular basis or trying to lose weight.  Patient voices awareness  of the foods he/she needs to avoid,  And follows a low GI diet about 50% of the time.  Has not had an annual diabetic eye exam.  Denies numbness and tingling in lower extremities.  Denies hypoglycemic symptoms.   Evaluated for back pain in Feb.  degenerative changes noted on lumbar spine . Pain is intermittent   Cc: hip pain brought on byThe pain is intermittent   2 bms daily  sometimes loose  Needs colonoscopy and annual /hiv labs   HTN:     Readings have been normal but last one from memory was low.  Not symptomatic. Taking telmisartan at night and amlodipine in the am  HIV:  Managed by [facility].   ROS: Patient denies headache, fevers, malaise, unintentional weight loss, skin rash,  eye pain, sinus congestion and sinus pain, sore throat, dysphagia,  hemoptysis , cough, dyspnea, wheezing, chest pain, palpitations, orthopnea, edema, abdominal pain, nausea, melena, diarrhea, constipation, flank pain, dysuria, hematuria, urinary  Frequency, nocturia, numbness, tingling, seizures,  Focal weakness, Loss of consciousness,  Tremor, insomnia, depression, anxiety, and suicidal ideation.    See pertinent positives and negatives per HPI.  Past Medical History:  Diagnosis Date  . 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 Chronic kidney disease Nov 2013   admission for ATN cr 11, acidosis  . HIV infection ([place])   . Hyperglycemia 04/04/2016  . Hyperlipidemia   . Hypertension   . Impaired fasting glucose 07/27/2015  . Urethritis 02/11/2015    Past Surgical History:  Procedure Laterality Date  . COLONOSCOPY    . SPLENECTOMY  2000   reason unclear "stopped working"    Family History  Problem Relation Age of Onset  . Cancer Mother   . Breast cancer Mother   . Colon cancer Father   . Birth defects Son        Breast     SOCIAL HX:  Social History   Socioeconomic History  . Marital status: Single    Spouse name: Not on file  . Number of children: Not on file  . Years of education: Not on file  . Highest education level: Not on file  Occupational History  . Not on file  Social Needs  . Financial resource strain: Not hard at all  .  Food insecurity    Worry: Never true    Inability: Never true  . Transportation needs    Medical: No    Non-medical: No  Tobacco Use  . Smoking status: Never Smoker  . Smokeless tobacco: Never Used  Substance and Sexual Activity  . Alcohol use: No  . Drug use: No  . Sexual activity: Not Currently    Comment: declined condoms  Lifestyle  . Physical activity    Days per week: 7 days    Minutes per session: 30 min  . Stress: Not at all  Relationships  . Social Herbalist on phone: Not on file    Gets together: Not on file    Attends  religious service: Not on file    Active member of club or organization: Not on file    Attends meetings of clubs or organizations: Not on file    Relationship status: Not on file  . Intimate partner violence    Fear of current or ex partner: Not on file    Emotionally abused: Not on file    Physically abused: Not on file    Forced sexual activity: Not on file  Other Topics Concern  . Not on file  Social History Narrative   Independent baseline. Lives by himself.  Education 12th.  Children none.  Caffeine tea one cup 4 x week.      Current Outpatient Medications:  .  allopurinol (ZYLOPRIM) 100 MG tablet, Take one tablet by mouth daily., Disp: 90 tablet, Rfl: 1 .  amLODipine (NORVASC) 5 MG tablet, TAKE 1 TABLET (5 MG TOTAL) BY MOUTH DAILY., Disp: 30 tablet, Rfl: 3 .  aspirin EC 81 MG tablet, Take 1 tablet (81 mg total) by mouth daily., Disp: 90 tablet, Rfl: 3 .  darunavir (PREZISTA) 800 MG tablet, TAKE 1 TABLET (800 MG TOTAL) BY MOUTH DAILY., Disp: 30 tablet, Rfl: 11 .  elvitegravir-cobicistat-emtricitabine-tenofovir (GENVOYA) 150-150-200-10 MG TABS tablet, TAKE 1 TABLET BY MOUTH EVERY DAY WITH BREAKFAST, Disp: 30 tablet, Rfl: 11 .  meclizine (ANTIVERT) 25 MG tablet, Take 1 tablet (25 mg total) by mouth 3 (three) times daily as needed for dizziness., Disp: 30 tablet, Rfl: 2 .  pravastatin (PRAVACHOL) 40 MG tablet, TAKE 1 TABLET BY MOUTH EVERY DAY, Disp: 90 tablet, Rfl: 3 .  telmisartan (MICARDIS) 40 MG tablet, Take 1 tablet (40 mg total) by mouth at bedtime., Disp: 30 tablet, Rfl: 5  EXAM:  VITALS per patient if applicable:  GENERAL: alert, oriented, appears well and in no acute distress  HEENT: atraumatic, conjunttiva clear, no obvious abnormalities on inspection of external nose and ears  NECK: normal movements of the head and neck  LUNGS: on inspection no signs of respiratory distress, breathing rate appears normal, no obvious gross SOB, gasping or wheezing  CV: no obvious  cyanosis  MS: moves all visible extremities without noticeable abnormality  PSYCH/NEURO: pleasant and cooperative, no obvious depression or anxiety, speech and thought processing grossly intact  ASSESSMENT AND PLAN:  HYPERTENSION, BENIGN ESSENTIAL Well controlled on current regimen which includes telmisartan 50 mg and amlodipine 5 g . Renal function stable, no changes today.  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 Patient has early  microalbuminuria. Patient is tolerating statin therapy for CAD risk reduction, low dose aspirin, and  ACE inhibitor for  renal protection and hypertension .  Lab Results  Component Value Date   HGBA1C 6.5 01/10/2018   Lab Results  Component Value Date   MICROALBUR 4.6 (H) 02/07/2018     Human immunodeficiency virus (HIV) disease Managed by [facility] ID with annual follow up.  Labs ordered for next visit   Family history of colon cancer in father Referred to colonoscopy  Chronic kidney disease (CKD), stage II (mild) Stable.   Secondary to acute tubular necrosis with severe metabolic acidosis in November 2013 requiring admission to [facility]. creatinine was 11 on admission and 3 at discharge. He was referred to nephrology after discharge.   Lab Results  Component Value Date   CREATININE 1.35 (H) 03/14/2018   Lab Results  Component Value Date   NA 138 03/14/2018   K 4.3 03/14/2018   CL 103 03/14/2018   CO2 23 03/14/2018        I discussed the assessment and treatment plan with the patient. The patient was provided an opportunity to ask questions and all were answered. The patient agreed with the plan and demonstrated an understanding of the instructions.   The patient was advised to call back or seek an in-person evaluation if the symptoms worsen or if the condition fails to improve as anticipated.  I provided 25 minutes of non-face-to-face time during this encounter.   [name], MD

## 2018-08-08 NOTE — Progress Notes (Addendum)
Subjective:   Adam Becker is a 63 y.o. male who presents for Medicare Annual/Subsequent preventive examination.  Review of Systems:  No ROS.  Medicare Wellness Virtual Visit.  Visual/audio telehealth visit, UTA vital signs.   See social history for additional risk factors.   Cardiac Risk Factors include: advanced age (>49men, >5 women);male gender;hypertension;diabetes mellitus     Objective:    Vitals: There were no vitals taken for this visit.  There is no height or weight on file to calculate BMI.  Advanced Directives 08/08/2018 08/18/2017 07/31/2017 07/25/2016 11/07/2013 10/24/2013  Does Patient Have a Medical Advance Directive? No No No No No No  Would patient like information on creating a medical advance directive? No - Patient declined No - Patient declined Yes (MAU/Ambulatory/Procedural Areas - Information given) No - Patient declined - -    Tobacco Social History   Tobacco Use  Smoking Status Never Smoker  Smokeless Tobacco Never Used     Counseling given: Not Answered   Clinical Intake:  Pre-visit preparation completed: Yes        Diabetes: Yes(Followed by pcp)  How often do you need to have someone help you when you read instructions, pamphlets, or other written materials from your doctor or pharmacy?: 1 - Never  Interpreter Needed?: No     Past Medical History:  Diagnosis Date  . 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 Chronic kidney disease Nov 2013   admission for ATN cr 11, acidosis  . HIV infection ([place])   . Hyperglycemia 04/04/2016  . Hyperlipidemia   . Hypertension   . Impaired fasting glucose 07/27/2015  . Urethritis 02/11/2015   Past Surgical History:  Procedure Laterality Date  . COLONOSCOPY    . SPLENECTOMY  2000   reason unclear "stopped working"   Family History  Problem Relation Age of Onset  . Cancer Mother   . Breast cancer Mother   . Colon cancer Father   . Birth defects Son        Breast    Social History   Socioeconomic History   . Marital status: Single    Spouse name: Not on file  . Number of children: Not on file  . Years of education: Not on file  . Highest education level: Not on file  Occupational History  . Not on file  Social Needs  . Financial resource strain: Not hard at all  . Food insecurity    Worry: Never true    Inability: Never true  . Transportation needs    Medical: No    Non-medical: No  Tobacco Use  . Smoking status: Never Smoker  . Smokeless tobacco: Never Used  Substance and Sexual Activity  . Alcohol use: No  . Drug use: No  . Sexual activity: Not Currently    Comment: declined condoms  Lifestyle  . Physical activity    Days per week: 7 days    Minutes per session: 30 min  . Stress: Not at all  Relationships  . Social Herbalist on phone: Not on file    Gets together: Not on file    Attends religious service: Not on file    Active member of club or organization: Not on file    Attends meetings of clubs or organizations: Not on file    Relationship status: Not on file  Other Topics Concern  . Not on file  Social History Narrative   Independent baseline. Lives by himself.  Education 12th.  Children none.  Caffeine tea one cup 4 x week.    Outpatient Encounter Medications as of 08/08/2018  Medication Sig  . allopurinol (ZYLOPRIM) 100 MG tablet Take one tablet by mouth daily.  Marland Kitchen amLODipine (NORVASC) 5 MG tablet TAKE 1 TABLET (5 MG TOTAL) BY MOUTH DAILY.  Marland Kitchen aspirin EC 81 MG tablet Take 1 tablet (81 mg total) by mouth daily.  . darunavir (PREZISTA) 800 MG tablet TAKE 1 TABLET (800 MG TOTAL) BY MOUTH DAILY.  Marland Kitchen elvitegravir-cobicistat-emtricitabine-tenofovir (GENVOYA) 150-150-200-10 MG TABS tablet TAKE 1 TABLET BY MOUTH EVERY DAY WITH BREAKFAST  . meclizine (ANTIVERT) 25 MG tablet Take 1 tablet (25 mg total) by mouth 3 (three) times daily as needed for dizziness.  . pravastatin (PRAVACHOL) 40 MG tablet TAKE 1 TABLET BY MOUTH EVERY DAY  . telmisartan (MICARDIS) 40 MG  tablet Take 1 tablet (40 mg total) by mouth at bedtime.   No facility-administered encounter medications on file as of 08/08/2018.     Activities of Daily Living In your present state of health, do you have any difficulty performing the following activities: 08/08/2018 08/18/2017  Hearing? N N  Vision? N N  Difficulty concentrating or making decisions? N N  Walking or climbing stairs? N N  Dressing or bathing? N N  Doing errands, shopping? N N  Preparing Food and eating ? N -  Using the Toilet? N -  In the past six months, have you accidently leaked urine? N -  Do you have problems with loss of bowel control? N -  Managing your Medications? N -  Managing your Finances? N -  Housekeeping or managing your Housekeeping? N -  Some recent data might be hidden    Patient Care Team: [name], MD as PCP - General (Internal Medicine) [name], [name], MD as PCP - Infectious Diseases (Infectious Diseases)   Assessment:   This is a routine wellness examination for Adam Becker.  I connected with patient 08/08/18 at  8:30 AM EDT by an audio enabled telemedicine application and verified that I am speaking with the correct person using two identifiers. Patient stated full name and DOB. Patient gave permission to continue with virtual visit. Patient's location was at home and Nurse's location was at [place] office.   Health Screenings  Colonoscopy - 10/2013. Discussed- he plans to schedule.  Glaucoma -none Hearing -demonstrates normal hearing during visit. Hemoglobin A1C - 12/2017 (6.5) Cholesterol - 11/2017 Dental- dentures Vision- he plans to schedule diabetic eye exam.  Social  Alcohol intake - no       Smoking history- never    Smokers in home? none Illicit drug use? none Exercise - treadmill 4-5 days weekly, 30 minutes Diet - regular Sexually Active -not currently BMI- discussed the importance of a healthy diet, water intake and the benefits of aerobic exercise.  Educational  material provided.   Safety  Patient feels safe at home- yes Patient does have smoke detectors at home- yes Patient does wear sunscreen or protective clothing when in direct sunlight -yes Patient does wear seat belt when in a moving vehicle -yes Patient drives- yes  BOFBP-10 precautions and sickness symptoms discussed.   Activities of Daily Living Patient denies needing assistance with: driving, household chores, feeding themselves, getting from bed to chair, getting to the toilet, bathing/showering, dressing, managing money, or preparing meals.  No new identified risk were noted.    Depression Screen Patient denies losing interest in daily life, feeling hopeless, or crying easily over  simple problems.   Medication-taking as directed and without issues.   Fall Screen Patient denies being afraid of falling or falling in the last year.   Memory Screen Patient is alert.  Patient denies difficulty focusing, concentrating or misplacing items. Correctly identified the president of the [place], season and recall.  Immunizations The following Immunizations were discussed: Influenza, shingles, pneumonia, and tetanus.   Other Providers Patient Care Team: [name], MD as PCP - General (Internal Medicine) [name], [name], MD as PCP - Infectious Diseases (Infectious Diseases)  Exercise Activities and Dietary recommendations Current Exercise Habits: Home exercise routine, Type of exercise: treadmill, Time (Minutes): 30, Frequency (Times/Week): 4, Weekly Exercise (Minutes/Week): 120, Intensity: Mild  Goals      Patient Stated   . Obtain Annual Eye (retinal)  Exam  (pt-stated)     Diabetic eye exam      Other   . Low Carb Foods     Portion Control        Fall Risk Fall Risk  08/08/2018 11/26/2017 07/31/2017 10/10/2016 07/25/2016  Falls in the past year? 0 0 No No No   Depression Screen PHQ 2/9 Scores 08/08/2018 11/26/2017 07/31/2017 10/10/2016  PHQ - 2 Score 0 0 0 0     Cognitive Function MMSE - Mini Mental State Exam 07/31/2017 07/25/2016  Orientation to time 5 5  Orientation to Place 5 5  Registration 3 3  Attention/ Calculation 5 5  Recall 3 3  Language- name 2 objects 2 2  Language- repeat 1 1  Language- follow 3 step command 3 3  Language- read & follow direction 1 1  Write a sentence 1 1  Copy design 1 1  Total score 30 30     6CIT Screen 08/08/2018  What Year? 0 points  What month? 0 points  What time? 0 points  Count back from 20 0 points  Months in reverse 0 points  Repeat phrase 0 points  Total Score 0    Immunization History  Administered Date(s) Administered  . H1N1 01/02/2008  . Hepatitis A 12/31/2006, 07/15/2007  . Hepatitis B 03/26/2001, 05/06/2001, 10/14/2001  . Influenza Inj Mdck Quad Pf 10/12/2017  . Influenza Split 11/15/2010, 12/07/2011  . Influenza Whole 10/02/2005, 10/15/2006, 11/26/2008, 11/01/2009  . Influenza,inj,Quad PF,6+ Mos 10/07/2012, 11/05/2013, 11/11/2014, 10/12/2015, 10/10/2016  . Meningococcal Polysaccharide 10/12/2015  . Pneumococcal Conjugate-13 06/21/2011  . Pneumococcal Polysaccharide-23 09/23/2005, 10/02/2005, 02/29/2012   Screening Tests Health Maintenance  Topic Date Due  . OPHTHALMOLOGY EXAM  10/03/1966  . TETANUS/TDAP  10/03/1975  . HEMOGLOBIN A1C  07/12/2018  . INFLUENZA VACCINE  08/24/2018  . COLONOSCOPY  11/08/2018  . FOOT EXAM  02/08/2019  . PNEUMOCOCCAL POLYSACCHARIDE VACCINE AGE 63-64 HIGH RISK  Completed  . Hepatitis C Screening  Completed  . HIV Screening  Completed        Plan:   End of life planning; Advanced aging; Advanced directives discussed.  No HCPOA/Living Will.  Additional information declined at this time.  I have personally reviewed and noted the following in the patient's chart:   . Medical and social history . Use of alcohol, tobacco or illicit drugs  . Current medications and supplements . Functional ability and status . Nutritional status . Physical activity  . Advanced directives . List of other physicians . Hospitalizations, surgeries, and ER visits in previous 12 months . Vitals . Screenings to include cognitive, depression, and falls . Referrals and appointments  In addition, I have reviewed and discussed  with patient certain preventive protocols, quality metrics, and best practice recommendations. A written personalized care plan for preventive services as well as general preventive health recommendations were provided to patient.     OBrien-Blaney, Denyce Harr L, LPN  0/05/1100   I have reviewed the above information and agree with above.   Deborra Medina, MD

## 2018-08-10 DIAGNOSIS — Z800 Family history of malignant neoplasm of digestive organs: Secondary | ICD-10-CM | POA: Insufficient documentation

## 2018-08-10 NOTE — Assessment & Plan Note (Addendum)
Managed by [facility] ID with annual follow up.  Labs ordered for next visit

## 2018-08-10 NOTE — Assessment & Plan Note (Signed)
Well controlled on current regimen which includes telmisartan 50 mg and amlodipine 5 g . Renal function stable, no changes today.

## 2018-08-10 NOTE — Assessment & Plan Note (Signed)
Stable.   Secondary to acute tubular necrosis with severe metabolic acidosis in November 2013 requiring admission to [facility]. creatinine was 11 on admission and 3 at discharge. He was referred to nephrology after discharge.   Lab Results  Component Value Date   CREATININE 1.35 (H) 03/14/2018   Lab Results  Component Value Date   NA 138 03/14/2018   K 4.3 03/14/2018   CL 103 03/14/2018   CO2 23 03/14/2018

## 2018-08-10 NOTE — Assessment & Plan Note (Signed)
Referred to colonoscopy

## 2018-08-10 NOTE — Assessment & Plan Note (Signed)
Historically well-controlled on diet alone . Patient has early  microalbuminuria. Patient is tolerating statin therapy for CAD risk reduction, low dose aspirin, and  ACE inhibitor for  renal protection and hypertension .  Lab Results  Component Value Date   HGBA1C 6.5 01/10/2018   Lab Results  Component Value Date   MICROALBUR 4.6 (H) 02/07/2018

## 2018-08-20 ENCOUNTER — Other Ambulatory Visit: Payer: Self-pay

## 2018-08-20 ENCOUNTER — Other Ambulatory Visit (INDEPENDENT_AMBULATORY_CARE_PROVIDER_SITE_OTHER): Payer: Medicare Other

## 2018-08-20 DIAGNOSIS — A523 Neurosyphilis, unspecified: Secondary | ICD-10-CM | POA: Diagnosis not present

## 2018-08-20 DIAGNOSIS — B20 Human immunodeficiency virus [HIV] disease: Secondary | ICD-10-CM

## 2018-08-20 DIAGNOSIS — N182 Chronic kidney disease, stage 2 (mild): Secondary | ICD-10-CM | POA: Diagnosis not present

## 2018-08-20 DIAGNOSIS — E1129 Type 2 diabetes mellitus with other diabetic kidney complication: Secondary | ICD-10-CM | POA: Diagnosis not present

## 2018-08-20 DIAGNOSIS — Z125 Encounter for screening for malignant neoplasm of prostate: Secondary | ICD-10-CM | POA: Diagnosis not present

## 2018-08-20 DIAGNOSIS — R809 Proteinuria, unspecified: Secondary | ICD-10-CM

## 2018-08-20 LAB — CBC WITH DIFFERENTIAL/PLATELET
Basophils Absolute: 0 10*3/uL (ref 0.0–0.1)
Basophils Relative: 0.8 % (ref 0.0–3.0)
Eosinophils Absolute: 0.3 10*3/uL (ref 0.0–0.7)
Eosinophils Relative: 5.1 % — ABNORMAL HIGH (ref 0.0–5.0)
HCT: 42.4 % (ref 39.0–52.0)
Hemoglobin: 14.5 g/dL (ref 13.0–17.0)
Lymphocytes Relative: 54.3 % — ABNORMAL HIGH (ref 12.0–46.0)
Lymphs Abs: 3.3 10*3/uL (ref 0.7–4.0)
MCHC: 34.1 g/dL (ref 30.0–36.0)
MCV: 100.2 fl — ABNORMAL HIGH (ref 78.0–100.0)
Monocytes Absolute: 0.7 10*3/uL (ref 0.1–1.0)
Monocytes Relative: 12.3 % — ABNORMAL HIGH (ref 3.0–12.0)
Neutro Abs: 1.7 10*3/uL (ref 1.4–7.7)
Neutrophils Relative %: 27.5 % — ABNORMAL LOW (ref 43.0–77.0)
Platelets: 362 10*3/uL (ref 150.0–400.0)
RBC: 4.23 Mil/uL (ref 4.22–5.81)
RDW: 13.7 % (ref 11.5–15.5)
WBC: 6 10*3/uL (ref 4.0–10.5)

## 2018-08-20 LAB — COMPREHENSIVE METABOLIC PANEL
ALT: 31 U/L (ref 0–53)
AST: 29 U/L (ref 0–37)
Albumin: 4.4 g/dL (ref 3.5–5.2)
Alkaline Phosphatase: 52 U/L (ref 39–117)
BUN: 21 mg/dL (ref 6–23)
CO2: 28 mEq/L (ref 19–32)
Calcium: 10.1 mg/dL (ref 8.4–10.5)
Chloride: 102 mEq/L (ref 96–112)
Creatinine, Ser: 1.32 mg/dL (ref 0.40–1.50)
GFR: 66.53 mL/min (ref >60.00)
Glucose, Bld: 114 mg/dL — ABNORMAL HIGH (ref 70–99)
Potassium: 4.9 mEq/L (ref 3.5–5.1)
Sodium: 137 mEq/L (ref 135–145)
Total Bilirubin: 0.3 mg/dL (ref 0.2–1.2)
Total Protein: 8 g/dL (ref 6.0–8.3)

## 2018-08-20 LAB — LIPID PANEL
Cholesterol: 158 mg/dL (ref 0–200)
HDL: 37 mg/dL — ABNORMAL LOW (ref >39.00)
LDL Cholesterol: 93 mg/dL (ref 0–99)
NonHDL: 120.9
Total CHOL/HDL Ratio: 4
Triglycerides: 139 mg/dL (ref 0.0–149.0)
VLDL: 27.8 mg/dL (ref 0.0–40.0)

## 2018-08-20 LAB — MICROALBUMIN / CREATININE URINE RATIO
Creatinine,U: 106 mg/dL
Microalb Creat Ratio: 10.6 mg/g (ref 0.0–30.0)
Microalb, Ur: 11.3 mg/dL — ABNORMAL HIGH (ref 0.0–1.9)

## 2018-08-20 LAB — PSA, MEDICARE: PSA: 0.58 ng/ml (ref 0.10–4.00)

## 2018-08-20 LAB — HEMOGLOBIN A1C: Hgb A1c MFr Bld: 6.1 % (ref 4.6–6.5)

## 2018-08-20 NOTE — Addendum Note (Signed)
Addended by: Leeanne Rio on: 08/20/2018 08:41 AM   Modules accepted: Orders

## 2018-08-21 ENCOUNTER — Other Ambulatory Visit: Payer: Self-pay | Admitting: Internal Medicine

## 2018-08-22 ENCOUNTER — Other Ambulatory Visit: Payer: Self-pay | Admitting: Internal Medicine

## 2018-08-22 DIAGNOSIS — G459 Transient cerebral ischemic attack, unspecified: Secondary | ICD-10-CM

## 2018-08-22 MED ORDER — ASPIRIN EC 81 MG PO TBEC: 81.0000 mg | DELAYED_RELEASE_TABLET | Freq: Every day | ORAL | 3 refills | Status: AC

## 2018-08-22 NOTE — Telephone Encounter (Signed)
Medication Refill - Medication: aspirin EC 81 MG tablet  Has the patient contacted their pharmacy? yes (Agent: If no, request that the patient contact the pharmacy for the refill.) (Agent: If yes, when and what did the pharmacy advise?)  Preferred Pharmacy (with phone number or street name):  [id number] - [place], [place] [address] [phone] (Phone) [phone] (Fax)   Agent: Please be advised that RX refills may take up to 3 business days. We ask that you follow-up with your pharmacy.

## 2018-08-22 NOTE — Telephone Encounter (Signed)
Refill sent.

## 2018-08-25 LAB — FLUORESCENT TREPONEMAL AB(FTA)-IGG-BLD: Fluorescent Treponemal ABS: REACTIVE — AB

## 2018-08-25 LAB — T-HELPER CELLS (CD4) COUNT (NOT AT ARMC)
Absolute CD4: 1153 cells/uL (ref 490–1740)
CD4 T Helper %: 31 % (ref 30–61)
Total lymphocyte count: 3729 cells/uL (ref 850–3900)

## 2018-08-25 LAB — RPR: RPR Ser Ql: REACTIVE — AB

## 2018-08-25 LAB — HIV-1 RNA QUANT-NO REFLEX-BLD
HIV 1 RNA Quant: 21 copies/mL — ABNORMAL HIGH
HIV-1 RNA Quant, Log: 1.32 Log copies/mL — ABNORMAL HIGH

## 2018-08-25 LAB — RPR TITER: RPR Titer: 1:2 {titer} — ABNORMAL HIGH

## 2018-09-06 ENCOUNTER — Other Ambulatory Visit: Payer: Self-pay | Admitting: Internal Medicine

## 2018-09-12 ENCOUNTER — Other Ambulatory Visit: Payer: Self-pay | Admitting: Internal Medicine

## 2018-09-12 ENCOUNTER — Other Ambulatory Visit: Payer: Self-pay | Admitting: Infectious Disease

## 2018-09-12 DIAGNOSIS — E785 Hyperlipidemia, unspecified: Secondary | ICD-10-CM

## 2018-09-15 ENCOUNTER — Other Ambulatory Visit: Payer: Self-pay | Admitting: Internal Medicine

## 2018-09-23 IMAGING — MR MR HEAD W/O CM
10 series · 48 of 48 positions shown · non-contrast
Comparison: 11/05/2007 head CT

CLINICAL DATA: Focal neuro deficit, stroke suspected. Dizzy spells.

EXAM:
MRI HEAD WITHOUT CONTRAST
TECHNIQUE: Multiplanar, multiecho pulse sequences of the brain and surrounding
structures were obtained without intravenous contrast.

[Series 2: T1 · sagittal · 5.0mm · 0.45mm/px · 3 of 25 slices shown (1 of 2)]
[im 1/25]
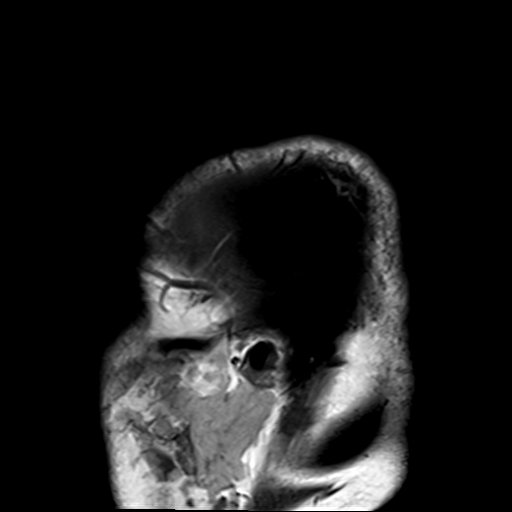
[im 13/25]
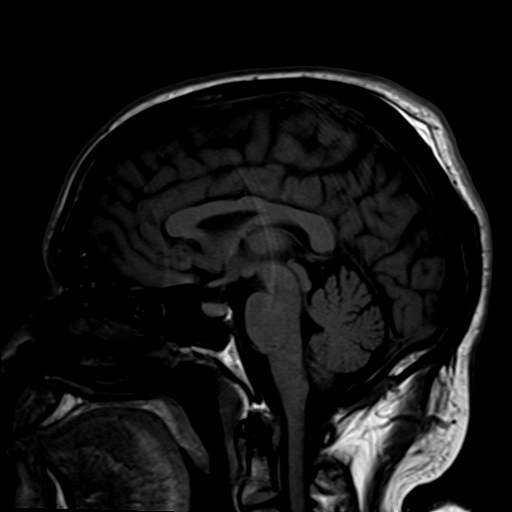
[im 25/25]
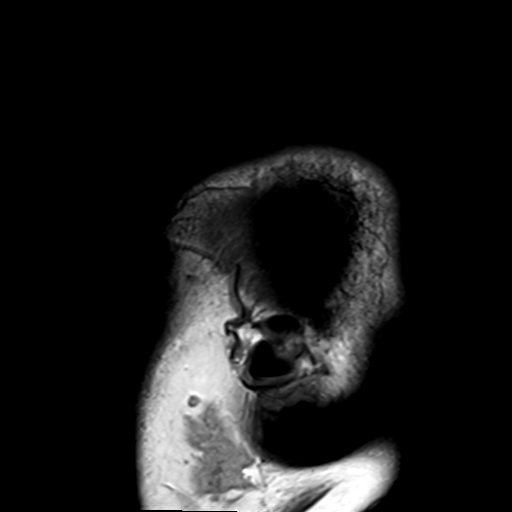

[Series 4: DWI · axial · 3.0mm · 1.80mm/px · z∈[-61,+101]mm · 5 of 53 slices shown (1 of 4)]
[im 1/53]
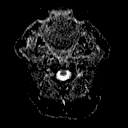
[im 14/53]
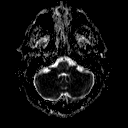
[im 27/53]
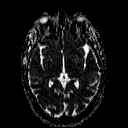
[im 40/53]
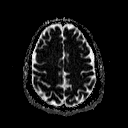
[im 53/53]
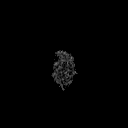

[Series 6: DWI · coronal · 3.0mm · 1.80mm/px · 4 of 45 slices shown (2 of 4)]
[im 1/45]
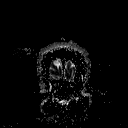
[im 15/45]
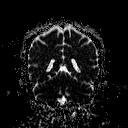
[im 30/45]
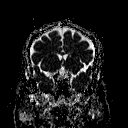
[im 45/45]
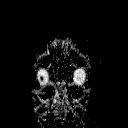

[Series 7: T2 · axial · 5.0mm · 0.60mm/px · z∈[-59,+104]mm · 2 of 26 slices shown (1 of 3)]
[im 1/26]
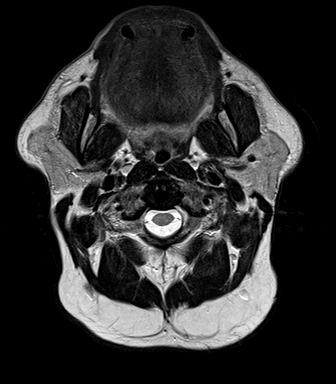
[im 26/26]
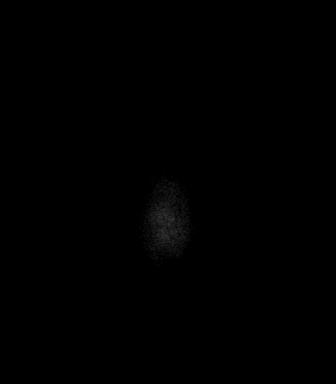

[Series 8: FLAIR · axial · 3.0mm · 0.45mm/px · z∈[-55,+101]mm · 5 of 53 slices shown]
[im 1/53]
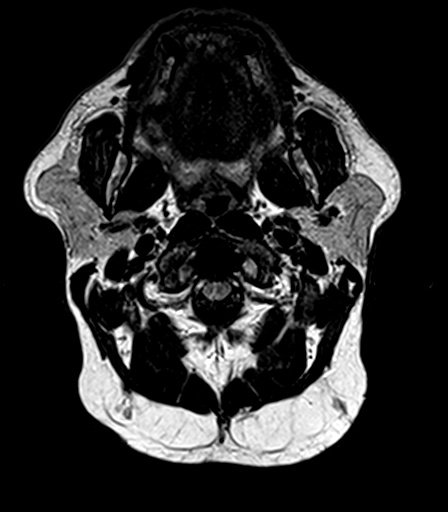
[im 14/53]
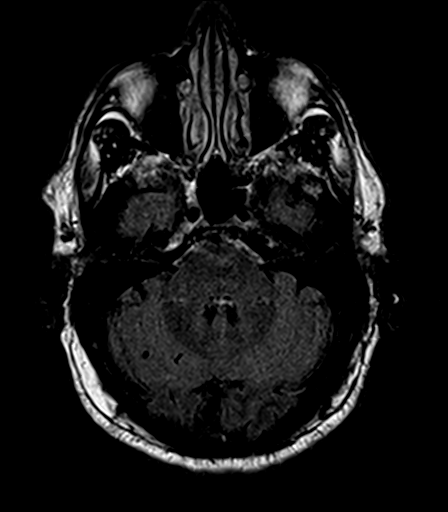
[im 27/53]
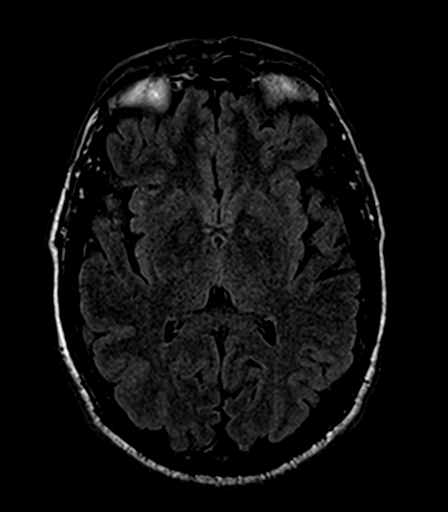
[im 40/53]
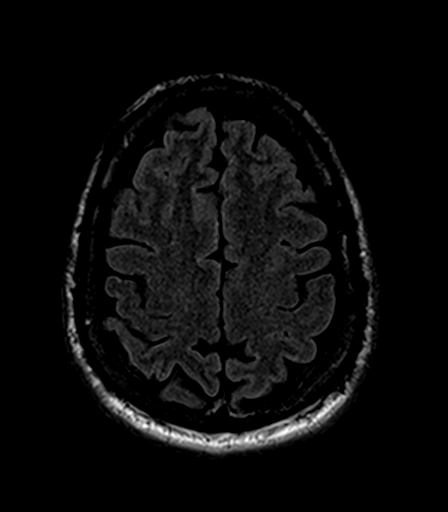
[im 53/53]
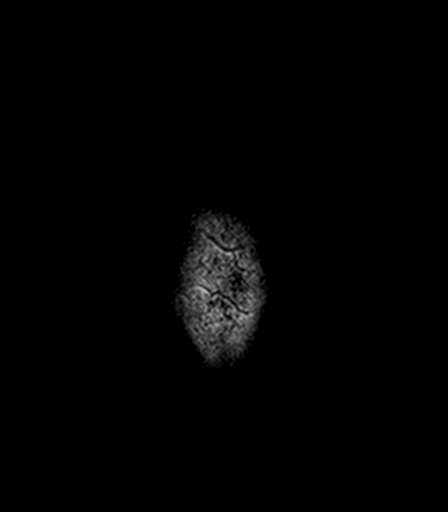

[Series 9: T2 · axial · 5.0mm · 0.45mm/px · z∈[-59,+104]mm · 2 of 26 slices shown (2 of 3)]
[im 1/26]
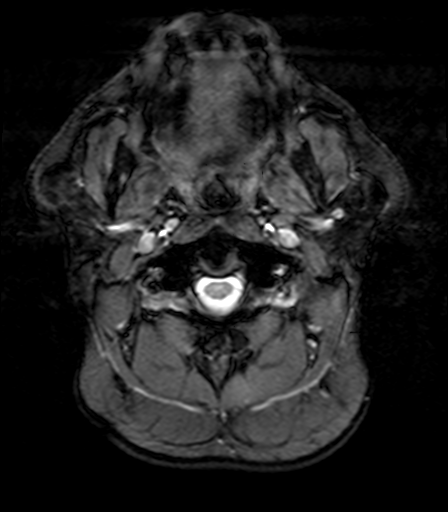
[im 26/26]
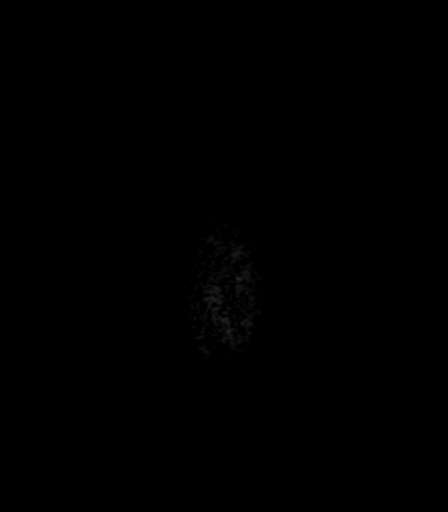

[Series 10: T1 · axial · 1.0mm · 1.00mm/px · z∈[-65,+110]mm · 16 of 176 slices shown (2 of 2)]
[im 1/176]
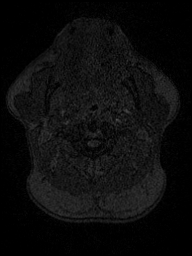
[im 12/176]
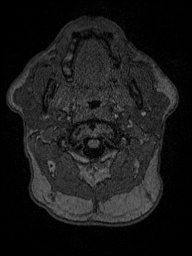
[im 24/176]
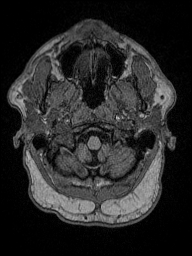
[im 36/176]
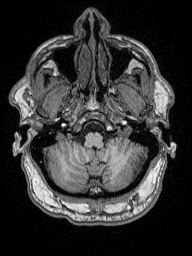
[im 47/176]
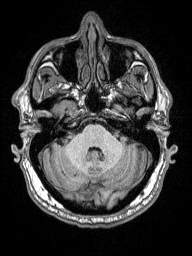
[im 59/176]
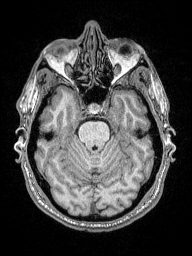
[im 71/176]
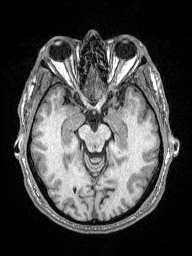
[im 82/176]
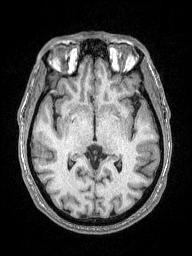
[im 94/176]
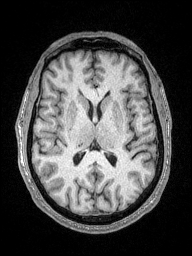
[im 106/176]
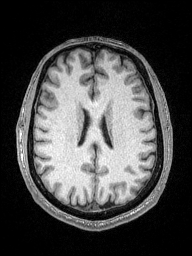
[im 117/176]
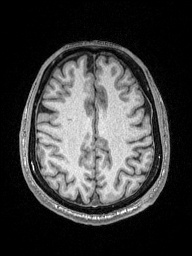
[im 129/176]
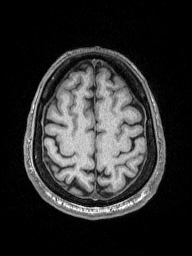
[im 141/176]
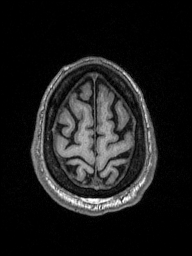
[im 152/176]
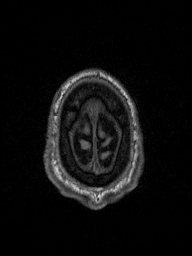
[im 164/176]
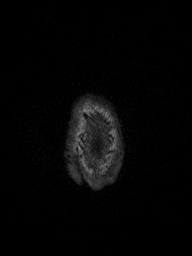
[im 176/176]
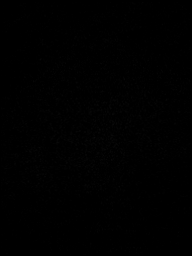

[Series 11: T2 · coronal · 5.0mm · 0.49mm/px · 2 of 27 slices shown (3 of 3)]
[im 1/27]
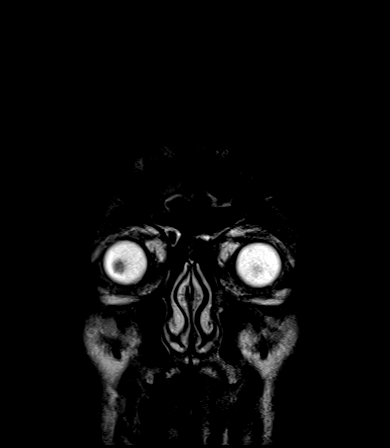
[im 27/27]
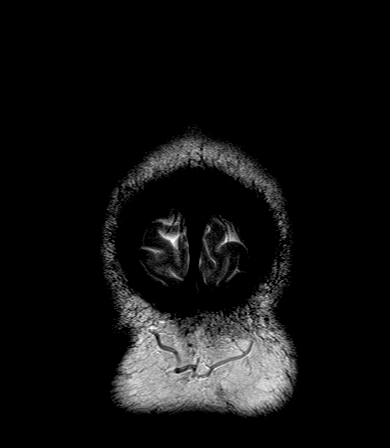

[Series 101: DWI · coronal · 3.0mm · 1.80mm/px · 4 of 45 slices shown (3 of 4)]
[im 1/45]
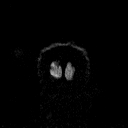
[im 15/45]
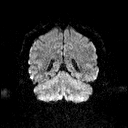
[im 30/45]
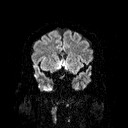
[im 45/45]
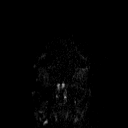

[Series 102: DWI · axial · 3.0mm · 1.80mm/px · z∈[-61,+101]mm · 5 of 55 slices shown (4 of 4)]
[im 1/55]
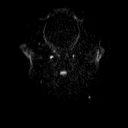
[im 14/55]
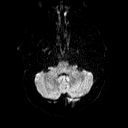
[im 28/55]
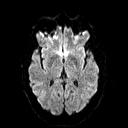
[im 41/55]
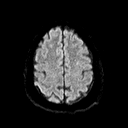
[im 55/55]
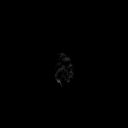

[48 of 48 positions shown; findings below may reference images not displayed]

FINDINGS: Brain: No acute infarction, hemorrhage, hydrocephalus, extra-axial
collection or mass lesion.

Cluster of 3 small remote right cerebellar infarcts. Few FLAIR
hyperintense foci in the supratentorial white matter, commonly seen
by the patient's age.

Vascular: Major flow voids are preserved.

Skull and upper cervical spine: No evidence of marrow lesion.

Sinuses/Orbits: Negative.  No mastoid or middle ear opacification.
IMPRESSION: 1. No acute finding.
2. Cluster of small remote right cerebellar infarcts.

## 2018-09-28 DIAGNOSIS — Z23 Encounter for immunization: Secondary | ICD-10-CM | POA: Diagnosis not present

## 2018-10-22 DIAGNOSIS — H40009 Preglaucoma, unspecified, unspecified eye: Secondary | ICD-10-CM | POA: Diagnosis not present

## 2018-10-22 DIAGNOSIS — H40003 Preglaucoma, unspecified, bilateral: Secondary | ICD-10-CM | POA: Diagnosis not present

## 2018-10-22 LAB — HM DIABETES EYE EXAM

## 2018-11-11 ENCOUNTER — Other Ambulatory Visit: Payer: Medicare Other

## 2018-11-12 ENCOUNTER — Other Ambulatory Visit: Payer: Self-pay

## 2018-11-12 ENCOUNTER — Other Ambulatory Visit: Payer: Medicare Other

## 2018-11-12 DIAGNOSIS — B20 Human immunodeficiency virus [HIV] disease: Secondary | ICD-10-CM | POA: Diagnosis not present

## 2018-11-12 LAB — CBC
HCT: 42 % (ref 38.5–50.0)
Hemoglobin: 14.6 g/dL (ref 13.2–17.1)
MCH: 33 pg (ref 27.0–33.0)
MCHC: 34.8 g/dL (ref 32.0–36.0)
MCV: 94.8 fL (ref 80.0–100.0)
MPV: 9.9 fL (ref 7.5–12.5)
Platelets: 392 10*3/uL (ref 140–400)
RBC: 4.43 10*6/uL (ref 4.20–5.80)
RDW: 12.9 % (ref 11.0–15.0)
WBC: 7.2 10*3/uL (ref 3.8–10.8)

## 2018-11-12 MED ORDER — DARUNAVIR ETHANOLATE 800 MG PO TABS: ORAL_TABLET | ORAL | 0 refills | Status: DC

## 2018-11-12 MED ORDER — GENVOYA 150-150-200-10 MG PO TABS: ORAL_TABLET | ORAL | 0 refills | Status: DC

## 2018-11-13 LAB — T-HELPER CELL (CD4) - (RCID CLINIC ONLY)
CD4 % Helper T Cell: 34 % (ref 33–65)
CD4 T Cell Abs: 1202 /uL (ref 400–1790)

## 2018-11-25 ENCOUNTER — Encounter: Payer: Medicare Other | Admitting: Infectious Disease

## 2018-11-27 ENCOUNTER — Ambulatory Visit (INDEPENDENT_AMBULATORY_CARE_PROVIDER_SITE_OTHER): Payer: Medicare Other | Admitting: Infectious Disease

## 2018-11-27 ENCOUNTER — Encounter: Payer: Medicare Other | Admitting: Infectious Disease

## 2018-11-27 ENCOUNTER — Encounter: Payer: Self-pay | Admitting: Infectious Disease

## 2018-11-27 ENCOUNTER — Other Ambulatory Visit: Payer: Self-pay

## 2018-11-27 VITALS — BP 130/87 | HR 71 | Temp 97.9°F | Wt 242.0 lb

## 2018-11-27 DIAGNOSIS — B20 Human immunodeficiency virus [HIV] disease: Secondary | ICD-10-CM

## 2018-11-27 DIAGNOSIS — E1129 Type 2 diabetes mellitus with other diabetic kidney complication: Secondary | ICD-10-CM | POA: Diagnosis not present

## 2018-11-27 DIAGNOSIS — I10 Essential (primary) hypertension: Secondary | ICD-10-CM

## 2018-11-27 DIAGNOSIS — R809 Proteinuria, unspecified: Secondary | ICD-10-CM

## 2018-11-27 MED ORDER — DARUNAVIR ETHANOLATE 800 MG PO TABS: ORAL_TABLET | ORAL | 11 refills | Status: DC

## 2018-11-27 MED ORDER — GENVOYA 150-150-200-10 MG PO TABS: ORAL_TABLET | ORAL | 11 refills | Status: DC

## 2018-11-27 NOTE — Progress Notes (Signed)
Subjective:  Chief complaint: followup for HIV disease   Patient ID: Adam Becker, male    DOB: Jan 17, 1956, 63 y.o.   MRN: [id number]  HPI  64  year old man with HIV currently well controlled on Genvoya with Prezista.  [name] had compiled prior R data as seen below  HIV Genotype Composite Data Genotype Dates:   Mutations in [place] impact drug susceptibility RT Mutations G190EQ, M41L, D67N, K70R, L74V, V75A, M184V, T215Y, K219Q  PI Mutations D30N, N88D  Integrase Mutations    Interpretation of Genotype Data per [facility] HIV Database Nucleoside RTIs  abacavir (ABC) High-Level Resistance zidovudine (AZT)  High-Level Resistance stavudine (D4T)  High-Level Resistance didanosine (DDI)   High-Level Resistance emtricitabine (FTC)  High-Level Resistance lamivudine (3TC)  High-Level Resistance tenofovir (TDF)  Intermediate Resistance   Non-Nucleoside RTIs  efavirenz (EFV)  High-Level Resistance etravirine (ETR)  Intermediate Resistance nevirapine (NVP) High-Level Resistance rilpivirine (RPV) High-Level Resistance   Protease Inhibitors  atazanavir/r (ATV/r)           Potential Low-Level Resistance darunavir/r (DRV/r)           Susceptible fosamprenavir/r (FPV/r)           Susceptible indinavir/r (IDV/r) Susceptible lopinavir/r (LPV/r)            Susceptible saquinavir/r (SQV/r)           Potential Low-Level Resistance tipranavir/r (TPV/r)           Susceptible   Integrase Inhibitors  None   He was maintained nice virological suppression on Genvoya and Prezista.  There are other combinations of protease inhibitors and integrates inhibitors that could likely suppress his virus but there is seems to be no strong reason to change his regimen at this point in time.  I do not think given his extensive and RTI and NNRTI resistance that taking a protease inhibitor out of his regimen would be wise at all.      Past Medical History:  Diagnosis Date  . 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 Chronic kidney disease Nov 2013   admission for ATN cr 11, acidosis  . HIV infection ([place])   . Hyperglycemia 04/04/2016  . Hyperlipidemia   . Hypertension   . Impaired fasting glucose 07/27/2015  . Urethritis 02/11/2015    Past Surgical History:  Procedure Laterality Date  . COLONOSCOPY    . SPLENECTOMY  2000   reason unclear "stopped working"    Family History  Problem Relation Age of Onset  . Cancer Mother   . Breast cancer Mother   . Colon cancer Father   . Birth defects Son        Breast       Social History   Socioeconomic History  . Marital status: Single    Spouse name: Not on file  . Number of children: Not on file  . Years of education: Not on file  . Highest education level: Not on file  Occupational History  . Not on file  Social Needs  . Financial resource strain: Not hard at all  . Food insecurity    Worry: Never true    Inability: Never true  . Transportation needs    Medical: No    Non-medical: No  Tobacco Use  . Smoking status: Never Smoker  . Smokeless tobacco: Never Used  Substance and Sexual Activity  . Alcohol use: No  . Drug use: No  . Sexual activity: Not Currently    Comment: declined condoms  Lifestyle  . Physical activity    Days per week: 7 days    Minutes per session: 30 min  . Stress: Not at all  Relationships  . Social Herbalist on phone: Not on file    Gets together: Not on file    Attends religious service: Not on file    Active member of club or organization: Not on file    Attends meetings of clubs or organizations: Not on file    Relationship status: Not on file  Other Topics Concern  . Not on file  Social History Narrative   Independent baseline. Lives by himself.  Education 12th.  Children none.  Caffeine tea one cup 4 x week.    No Known Allergies   Current Outpatient Medications:  .  allopurinol (ZYLOPRIM) 100 MG tablet, TAKE 1 TABLET BY MOUTH EVERY DAY, Disp: 90 tablet, Rfl: 1 .   amLODipine (NORVASC) 5 MG tablet, TAKE 1 TABLET (5 MG TOTAL) BY MOUTH DAILY., Disp: 30 tablet, Rfl: 3 .  aspirin EC 81 MG tablet, Take 1 tablet (81 mg total) by mouth daily., Disp: 90 tablet, Rfl: 3 .  darunavir (PREZISTA) 800 MG tablet, TAKE 1 TABLET (800 MG TOTAL) BY MOUTH DAILY., Disp: 30 tablet, Rfl: 0 .  elvitegravir-cobicistat-emtricitabine-tenofovir (GENVOYA) 150-150-200-10 MG TABS tablet, TAKE 1 TABLET BY MOUTH EVERY DAY WITH BREAKFAST, Disp: 30 tablet, Rfl: 0 .  meclizine (ANTIVERT) 25 MG tablet, Take 1 tablet (25 mg total) by mouth 3 (three) times daily as needed for dizziness., Disp: 30 tablet, Rfl: 2 .  pravastatin (PRAVACHOL) 40 MG tablet, TAKE 1 TABLET BY MOUTH EVERY DAY, Disp: 90 tablet, Rfl: 3 .  telmisartan (MICARDIS) 40 MG tablet, TAKE 1 TABLET BY MOUTH EVERYDAY AT BEDTIME, Disp: 90 tablet, Rfl: 1   Review of Systems  Constitutional: Negative for activity change, appetite change, chills, diaphoresis, fever and unexpected weight change.  HENT: Negative for congestion, rhinorrhea, sinus pressure, sneezing, sore throat and trouble swallowing.   Eyes: Negative for photophobia and visual disturbance.  Respiratory: Negative for cough, chest tightness, shortness of breath, wheezing and stridor.   Cardiovascular: Negative for chest pain, palpitations and leg swelling.  Gastrointestinal: Negative for abdominal distention, abdominal pain, anal bleeding, blood in stool, constipation, diarrhea, nausea and vomiting.  Genitourinary: Negative for difficulty urinating, dysuria, flank pain and hematuria.  Musculoskeletal: Negative for arthralgias, back pain, gait problem, joint swelling and myalgias.  Skin: Negative for color change, pallor, rash and wound.  Neurological: Negative for dizziness, tremors, weakness and light-headedness.  Hematological: Negative for adenopathy. Does not bruise/bleed easily.  Psychiatric/Behavioral: Negative for agitation, behavioral problems, confusion, decreased  concentration, dysphoric mood and sleep disturbance. The patient is not nervous/anxious.        Objective:   Physical Exam  Constitutional: He is oriented to person, place, and time. He appears well-developed and well-nourished. No distress.  HENT:  Head: Normocephalic and atraumatic.  Mouth/Throat: Oropharynx is clear and moist. No oropharyngeal exudate.  Eyes: Pupils are equal, round, and reactive to light. Conjunctivae and EOM are normal. No scleral icterus.  Neck: Normal range of motion. Neck supple. No JVD present.  Cardiovascular: Normal rate, regular rhythm and normal heart sounds.  Pulmonary/Chest: Effort normal. No respiratory distress. He has no wheezes.  Abdominal: He exhibits no distension.  Musculoskeletal:        General: No edema.  Neurological: He is alert and oriented to person, place, and time. Coordination normal.  Skin: Skin is warm  and dry. No rash noted. He is not diaphoretic. No erythema.  Psychiatric: He has a normal mood and affect. His behavior is normal. Judgment and thought content normal.          Assessment & Plan:  HIV: continue Genvoya and Prezista, see above   Could consider TIVICAY and Symtuza as an option but other than better higher resistance barrier provided by TIVICAY there is no really strong compelling reason to switch this regimen.  Perhaps 1 newer drugs, such as Islatravir we could contemplate moving away from boosted PI but I would be careful   HTN: followed with Dr. Derrel Nip  There were no vitals filed for this visit.   Hyperlipidemia: at goal  Lipid Panel     Component Value Date/Time   CHOL 158 08/20/2018 0841   TRIG 139.0 08/20/2018 0841   HDL 37.00 (L) 08/20/2018 0841   CHOLHDL 4 08/20/2018 0841   VLDL 27.8 08/20/2018 0841   LDLCALC 93 08/20/2018 0841   LDLCALC 107 (H) 11/06/2017 1037   LDLDIRECT 99 07/26/2015 1700     Syphilis: titers low NCS  DM managed by Dr.[name] Asplenia:  He has had menactra in Sept 20`17, 14  valent pneumococcal vaccine 2013 and 23 valent pneumococcal vaccine  2014

## 2018-12-03 LAB — HIV-1 RNA QUANT-NO REFLEX-BLD
HIV 1 RNA Quant: <20 copies/mL
HIV-1 RNA Quant, Log: <1.3 Log copies/mL

## 2019-02-27 ENCOUNTER — Other Ambulatory Visit: Payer: Self-pay | Admitting: Internal Medicine

## 2019-02-27 ENCOUNTER — Other Ambulatory Visit: Payer: Self-pay

## 2019-02-27 ENCOUNTER — Encounter: Payer: Self-pay | Admitting: Emergency Medicine

## 2019-02-27 ENCOUNTER — Emergency Department
Admission: EM | Admit: 2019-02-27 | Discharge: 2019-02-27 | Disposition: A | Discharge status: Home / Self Care | Payer: Medicare Other | Attending: Emergency Medicine | Admitting: Emergency Medicine

## 2019-02-27 ENCOUNTER — Telehealth: Payer: Self-pay | Admitting: Internal Medicine

## 2019-02-27 ENCOUNTER — Emergency Department: Payer: Medicare Other

## 2019-02-27 DIAGNOSIS — Z79899 Other long term (current) drug therapy: Secondary | ICD-10-CM | POA: Diagnosis not present

## 2019-02-27 DIAGNOSIS — R079 Chest pain, unspecified: Secondary | ICD-10-CM | POA: Diagnosis not present

## 2019-02-27 DIAGNOSIS — Z7982 Long term (current) use of aspirin: Secondary | ICD-10-CM | POA: Diagnosis not present

## 2019-02-27 DIAGNOSIS — Z8673 Personal history of transient ischemic attack (TIA), and cerebral infarction without residual deficits: Secondary | ICD-10-CM | POA: Insufficient documentation

## 2019-02-27 DIAGNOSIS — S29011A Strain of muscle and tendon of front wall of thorax, initial encounter: Secondary | ICD-10-CM | POA: Diagnosis not present

## 2019-02-27 DIAGNOSIS — N182 Chronic kidney disease, stage 2 (mild): Secondary | ICD-10-CM | POA: Diagnosis not present

## 2019-02-27 DIAGNOSIS — I129 Hypertensive chronic kidney disease with stage 1 through stage 4 chronic kidney disease, or unspecified chronic kidney disease: Secondary | ICD-10-CM | POA: Diagnosis not present

## 2019-02-27 DIAGNOSIS — R200 Anesthesia of skin: Secondary | ICD-10-CM

## 2019-02-27 DIAGNOSIS — B20 Human immunodeficiency virus [HIV] disease: Secondary | ICD-10-CM | POA: Diagnosis not present

## 2019-02-27 DIAGNOSIS — R0789 Other chest pain: Secondary | ICD-10-CM | POA: Diagnosis not present

## 2019-02-27 DIAGNOSIS — T148XXA Other injury of unspecified body region, initial encounter: Secondary | ICD-10-CM

## 2019-02-27 DIAGNOSIS — R809 Proteinuria, unspecified: Secondary | ICD-10-CM

## 2019-02-27 DIAGNOSIS — E1129 Type 2 diabetes mellitus with other diabetic kidney complication: Secondary | ICD-10-CM

## 2019-02-27 LAB — TROPONIN I (HIGH SENSITIVITY)
Troponin I (High Sensitivity): 8 ng/L (ref <18)
Troponin I (High Sensitivity): 8 ng/L (ref <18)

## 2019-02-27 LAB — CBC
HCT: 41.4 % (ref 39.0–52.0)
Hemoglobin: 14.6 g/dL (ref 13.0–17.0)
MCH: 33.3 pg (ref 26.0–34.0)
MCHC: 35.3 g/dL (ref 30.0–36.0)
MCV: 94.3 fL (ref 80.0–100.0)
Platelets: 319 10*3/uL (ref 150–400)
RBC: 4.39 MIL/uL (ref 4.22–5.81)
RDW: 14.6 % (ref 11.5–15.5)
WBC: 6 10*3/uL (ref 4.0–10.5)
nRBC: 0 % (ref 0.0–0.2)

## 2019-02-27 LAB — BASIC METABOLIC PANEL
Anion gap: 7 (ref 5–15)
BUN: 12 mg/dL (ref 8–23)
CO2: 26 mmol/L (ref 22–32)
Calcium: 9.3 mg/dL (ref 8.9–10.3)
Chloride: 104 mmol/L (ref 98–111)
Creatinine, Ser: 1.11 mg/dL (ref 0.61–1.24)
GFR calc Af Amer: >60 mL/min (ref >60)
GFR calc non Af Amer: >60 mL/min (ref >60)
Glucose, Bld: 100 mg/dL — ABNORMAL HIGH (ref 70–99)
Potassium: 3.7 mmol/L (ref 3.5–5.1)
Sodium: 137 mmol/L (ref 135–145)

## 2019-02-27 MED ORDER — SODIUM CHLORIDE 0.9% FLUSH: 3.0000 mL | Freq: Once | INTRAVENOUS | Status: DC

## 2019-02-27 NOTE — ED Provider Notes (Signed)
[facility] Emergency Department Provider Note   ____________________________________________   First MD Initiated Contact with Patient 02/27/19 1623     (approximate)  I have reviewed the triage vital signs and the nursing notes.   HISTORY  Chief Complaint Chest Pain    HPI Adam Becker is a 64 y.o. male with past medical history of hypertension, hyperlipidemia, CKD, and HIV who presents to the ED for chest pain.  Patient reports he has had pain around his right lower chest since yesterday evening.  He describes it as a sharp and pulling pain that is exacerbated by movement and certain positions.  He denies any pain at rest and has not had any fevers, cough, or shortness of breath.  He has not noticed any rashes to the area.  He does report heavy lifting a couple of days ago where he helped his nephew carry a generator.  He denies any cardiac history.        Past Medical History:  Diagnosis Date  . 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 Chronic kidney disease Nov 2013   admission for ATN cr 11, acidosis  . HIV infection ([place])   . Hyperglycemia 04/04/2016  . Hyperlipidemia   . Hypertension   . Impaired fasting glucose 07/27/2015  . Urethritis 02/11/2015    Patient Active Problem List   Diagnosis Date Noted  . Family history of colon cancer in father 08/10/2018  . Right flank pain 02/09/2018  . Neuropathy 01/12/2018  . 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 Elevated liver function tests 11/27/2017  . Vertigo 09/11/2017  . TIA (transient ischemic attack) 08/18/2017  . Need for immunization against influenza 10/10/2016  . Prostate cancer screening 10/10/2016  . 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 Routine adult health maintenance 04/23/2014  . Allergic rhinitis due to allergen 04/23/2014  . 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  Gout attack 09/20/2012  . Obesity (BMI 30.0-34.9) 12/09/2011  . Hyperlipidemia 11/01/2009  . NEUROSYPHILIS 03/11/2009  . PARESTHESIA, HANDS 06/18/2008  . LATE SYPHILIS, LATENT 10/30/2007  . ERECTILE DYSFUNCTION, MILD 12/31/2006  . HYPERTENSION, BENIGN ESSENTIAL 10/15/2006  . Human immunodeficiency virus (HIV) disease ([place]) 04/16/2006    Past Surgical History:  Procedure Laterality Date  . COLONOSCOPY    . SPLENECTOMY  2000   reason unclear "stopped working"    Prior to Admission medications   Medication Sig Start Date End Date Taking? Authorizing Provider  allopurinol (ZYLOPRIM) 100 MG tablet TAKE 1 TABLET BY MOUTH EVERY DAY 09/06/18   [name], MD  amLODipine (NORVASC) 5 MG tablet TAKE 1 TABLET (5 MG TOTAL) BY MOUTH DAILY. 09/09/15   [name], MD  aspirin EC 81 MG tablet Take 1 tablet (81 mg total) by mouth daily. 08/22/18   [name], FNP  darunavir (PREZISTA) 800 MG tablet TAKE 1 TABLET (800 MG TOTAL) BY MOUTH DAILY. 11/27/18   Truman Hayward, MD  elvitegravir-cobicistat-emtricitabine-tenofovir (GENVOYA) 150-150-200-10 MG TABS tablet TAKE 1 TABLET BY MOUTH EVERY DAY WITH BREAKFAST 11/27/18   [name], [name], MD  meclizine (ANTIVERT) 25 MG tablet Take 1 tablet (25 mg total) by mouth 3 (three) times daily as needed for dizziness. 01/10/18   [name], MD  pravastatin (PRAVACHOL) 40 MG tablet TAKE 1 TABLET BY MOUTH EVERY DAY 09/12/18   [name], [name], MD  telmisartan (MICARDIS) 40 MG tablet  TAKE 1 TABLET BY MOUTH EVERYDAY AT BEDTIME 09/16/18   [name], MD    Allergies Patient has no known allergies.  Family History  Problem Relation Age of Onset  . Cancer Mother   . Breast cancer Mother   . Colon cancer Father   . Birth defects Son        Breast     Social History Social History   Tobacco Use  . Smoking status: Never Smoker  . Smokeless tobacco: Never Used  Substance Use Topics  . Alcohol use: No  . Drug use: No    Review  of Systems  Constitutional: No fever/chills Eyes: No visual changes. ENT: No sore throat. Cardiovascular: Positive for chest pain. Respiratory: Denies shortness of breath. Gastrointestinal: No abdominal pain.  No nausea, no vomiting.  No diarrhea.  No constipation. Genitourinary: Negative for dysuria. Musculoskeletal: Negative for back pain. Skin: Negative for rash. Neurological: Negative for headaches, focal weakness or numbness.  ____________________________________________   PHYSICAL EXAM:  VITAL SIGNS: ED Triage Vitals  Enc Vitals Group     BP 02/27/19 1301 139/84     Pulse Rate 02/27/19 1301 79     Resp 02/27/19 1301 18     Temp 02/27/19 1301 98.4 F (36.9 C)     Temp Source 02/27/19 1301 Oral     SpO2 02/27/19 1301 99 %     Weight 02/27/19 1302 237 lb (107.5 kg)     Height 02/27/19 1302 5\' 11"  (1.803 m)     Head Circumference --      Peak Flow --      Pain Score 02/27/19 1302 7     Pain Loc --      Pain Edu? --      Excl. in [place]? --     Constitutional: Alert and oriented. Eyes: Conjunctivae are normal. Head: Atraumatic. Nose: No congestion/rhinnorhea. Mouth/Throat: Mucous membranes are moist. Neck: Normal ROM Cardiovascular: Normal rate, regular rhythm. Grossly normal heart sounds. Respiratory: Normal respiratory effort.  No retractions. Lungs CTAB.  No chest wall tenderness. Gastrointestinal: Soft and nontender. No distention. Genitourinary: deferred Musculoskeletal: No lower extremity tenderness nor edema. Neurologic:  Normal speech and language. No gross focal neurologic deficits are appreciated. Skin:  Skin is warm, dry and intact. No rash noted. Psychiatric: Mood and affect are normal. Speech and behavior are normal.  ____________________________________________   LABS (all labs ordered are listed, but only abnormal results are displayed)  Labs Reviewed  BASIC METABOLIC PANEL - Abnormal; Notable for the following components:      Result Value    Glucose, Bld 100 (*)    All other components within normal limits  CBC  TROPONIN I (HIGH SENSITIVITY)  TROPONIN I (HIGH SENSITIVITY)   ____________________________________________  EKG  ED ECG REPORT I, Blake Divine, the attending physician, personally viewed and interpreted this ECG.   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  2 sets of troponin are negative  and his symptoms seem very atypical for ACS.  Musculoskeletal pain seems to be a more likely etiology, chest x-ray and remainder of labs are also unremarkable.  No rashes to suggest shingles.  Counseled patient to follow-up with his PCP and otherwise return to the ED for new worsening symptoms, patient agrees with plan.      ____________________________________________   FINAL CLINICAL IMPRESSION(S) / ED DIAGNOSES  Final diagnoses:  Nonspecific chest pain  Muscle strain     ED Discharge Orders    None       Note:  This document was prepared using Dragon voice recognition software and may include unintentional dictation errors.   Blake Divine, MD 02/27/19 [phone]

## 2019-02-27 NOTE — ED Triage Notes (Signed)
Pt presents to ED via POV with c/o R sided chest discomfort. Pt states started last night, denies radiation. Denies SOB, N/V, denies dizzines/lightheadedness. Pt states R sided CP worse with movement/leaning to the right.

## 2019-02-27 NOTE — Telephone Encounter (Signed)
Pt called in and stated that he was having chest pains. I called Access Nurse and transferred call through them.

## 2019-02-27 NOTE — ED Notes (Signed)
Rainbow and urine sent to lab.

## 2019-02-27 NOTE — Telephone Encounter (Signed)
Pt called in and said he is having tingling feeling in legs/arm sometimes it burns. He also has a pinch in shoulder right below neck. This has been going on for 4 months. He doesn't know where it is coming from and he is concerned. I scheduled him an appt with Dr. Derrel Nip on 2/16 due to patient needing a Tues or Thurs. Please advise if patient needs to be sooner or if this appt needs to be in office. Pt would like call back.

## 2019-02-27 NOTE — Telephone Encounter (Signed)
Needs to be face to face,  And not urgent ( he has had it since 2019!!) so feb 16 ok.  He has been lost to follow up so he needs to get labs and neck films prior to appt so they will be available for review during appt  If he passes screening he can get them done here

## 2019-02-28 NOTE — Telephone Encounter (Signed)
Please screen pt. If passes screening have him come in prior to appt to have labs and x-ray done. If does not pass screening he can have them done at [facility] and appt changed to virtual

## 2019-03-11 ENCOUNTER — Ambulatory Visit (INDEPENDENT_AMBULATORY_CARE_PROVIDER_SITE_OTHER): Payer: Medicare Other | Admitting: Internal Medicine

## 2019-03-11 ENCOUNTER — Ambulatory Visit (INDEPENDENT_AMBULATORY_CARE_PROVIDER_SITE_OTHER): Payer: Medicare Other

## 2019-03-11 ENCOUNTER — Other Ambulatory Visit: Payer: Self-pay

## 2019-03-11 ENCOUNTER — Encounter: Payer: Self-pay | Admitting: Internal Medicine

## 2019-03-11 ENCOUNTER — Other Ambulatory Visit (INDEPENDENT_AMBULATORY_CARE_PROVIDER_SITE_OTHER): Payer: Medicare Other

## 2019-03-11 DIAGNOSIS — R202 Paresthesia of skin: Secondary | ICD-10-CM | POA: Diagnosis not present

## 2019-03-11 DIAGNOSIS — E1129 Type 2 diabetes mellitus with other diabetic kidney complication: Secondary | ICD-10-CM

## 2019-03-11 DIAGNOSIS — R222 Localized swelling, mass and lump, trunk: Secondary | ICD-10-CM | POA: Diagnosis not present

## 2019-03-11 DIAGNOSIS — R809 Proteinuria, unspecified: Secondary | ICD-10-CM

## 2019-03-11 DIAGNOSIS — R200 Anesthesia of skin: Secondary | ICD-10-CM | POA: Diagnosis not present

## 2019-03-11 DIAGNOSIS — R209 Unspecified disturbances of skin sensation: Secondary | ICD-10-CM

## 2019-03-11 DIAGNOSIS — M542 Cervicalgia: Secondary | ICD-10-CM | POA: Diagnosis not present

## 2019-03-11 DIAGNOSIS — K432 Incisional hernia without obstruction or gangrene: Secondary | ICD-10-CM | POA: Insufficient documentation

## 2019-03-11 LAB — COMPREHENSIVE METABOLIC PANEL
ALT: 29 U/L (ref 0–53)
AST: 26 U/L (ref 0–37)
Albumin: 4.4 g/dL (ref 3.5–5.2)
Alkaline Phosphatase: 47 U/L (ref 39–117)
BUN: 14 mg/dL (ref 6–23)
CO2: 29 mEq/L (ref 19–32)
Calcium: 9.5 mg/dL (ref 8.4–10.5)
Chloride: 102 mEq/L (ref 96–112)
Creatinine, Ser: 1.15 mg/dL (ref 0.40–1.50)
GFR: 77.86 mL/min (ref >60.00)
Glucose, Bld: 105 mg/dL — ABNORMAL HIGH (ref 70–99)
Potassium: 4.2 mEq/L (ref 3.5–5.1)
Sodium: 136 mEq/L (ref 135–145)
Total Bilirubin: 0.3 mg/dL (ref 0.2–1.2)
Total Protein: 8.3 g/dL (ref 6.0–8.3)

## 2019-03-11 LAB — VITAMIN B12: Vitamin B-12: 351 pg/mL (ref 211–911)

## 2019-03-11 LAB — HEMOGLOBIN A1C: Hgb A1c MFr Bld: 6.7 % — ABNORMAL HIGH (ref 4.6–6.5)

## 2019-03-11 LAB — TSH: TSH: 1.43 u[IU]/mL (ref 0.35–4.50)

## 2019-03-11 LAB — MICROALBUMIN / CREATININE URINE RATIO
Creatinine,U: 36.8 mg/dL
Microalb Creat Ratio: 15.4 mg/g (ref 0.0–30.0)
Microalb, Ur: 5.7 mg/dL — ABNORMAL HIGH (ref 0.0–1.9)

## 2019-03-11 NOTE — Assessment & Plan Note (Signed)
Given his prior splenectomy resulting in a midline vertical scar,  The bulge on exam may be an incisional hernia vs a displaced diastasis rectus.  He denies pain but does note that the mass is enlarging. Referral to general surgery advised.

## 2019-03-11 NOTE — Assessment & Plan Note (Signed)
Remains well-controlled on diet alone . Patient has early  microalbuminuria. Patient is tolerating statin therapy for CAD risk reduction, low dose aspirin, and  ACE inhibitor for  renal protection and hypertension .  Lab Results  Component Value Date   HGBA1C 6.7 (H) 03/11/2019   Lab Results  Component Value Date   MICROALBUR 5.7 (H) 03/11/2019

## 2019-03-11 NOTE — Assessment & Plan Note (Signed)
ddx includes carpal tunnel syndrome,  Diabetic neuropathy, neurosyphilis and cervical spinal stenosis  (sugested by plain films) .  Referral to neurology for evaluation including EMG/Nerve conduction studies

## 2019-03-11 NOTE — Patient Instructions (Signed)
1) the bulge on your abdominal wall may be just due to diastasis rectus, ( see below:  a normal occurrence that does not require surgery), but you may also have a small Incisional hernia developing due to your prior surgery  General Surgery referral is recommended to either: Job Founds, MD Carson Myrtle, MD Cherene Julian, MD   2) your "tingling " and neck pain appears to be related to degenerative changes in your cervical spine,  And could be due to spinal stenosis (see below) But could also be due to diabetic neuropathy and/or carpal tunnel syndrome in your left wrist (less serious conditions)  Referral to Dr Manuella Ghazi (Neurology) is recommended to help determine which condition is causing your symptoms   Diastasis Recti  Diastasis recti is when the muscles of the abdomen (rectus abdominis muscles) become thin and separate. The result is a wider space between the right and left abdomen (abdominal) muscles. This wider space between the muscles may cause a bulge in the middle of your abdomen. You may notice this bulge when you are straining or when you sit up from a lying down position. Diastasis recti can affect men and women. It is most common among pregnant women, infants, people who are obese, and people who have had abdominal surgery. Exercise or surgical treatment may help correct it. What are the causes? Common causes of this condition include:  Pregnancy. The growing uterus puts pressure on the abdominal muscles, which causes the muscles to separate.  Obesity. Excess fat puts pressure on abdominal muscles.  Weightlifting.  Some abdomen exercises.  Advanced age.  Genetics.  Prior abdominal surgery. What increases the risk? This condition is more likely to develop in:  Women.  Newborns, especially newborns who are born early (prematurely). What are the signs or symptoms? Common symptoms of this condition include:  A bulge in the middle of the abdomen. You will notice it  most when you sit up or strain.  Pain in the low back, pelvis, or hips.  Constipation.  Inability to control when you urinate (urinary incontinence).  Bloating.  Poor posture. How is this diagnosed? This condition is diagnosed with a physical exam. Your health care provider will ask you to lie flat on your back and do a crunch or half sit-up. If you have diastasis recti, a vertical bulge will appear between your abdominal muscles in the center of your abdomen. Your health care provider will measure the gap between your muscles with one of the following:  A medical device used to measure the space between two objects (caliper).  A tape measure.  CT scan.  Ultrasound.  Finger spaces. Your health care provider will measure the space using their fingers. How is this treated? If your muscle separation is not too large, you may not need treatment. However, if you are a woman who plans to become pregnant again, you should treat this condition before your next pregnancy. Treatment may include:  Physical therapy to strengthen and tighten your abdominal muscles.  Lifestyle changes such as weight loss and exercise.  Over-the-counter pain medicines as needed.  Surgery to correct the separation. Follow these instructions at home: Activity  Return to your normal activities as told by your health care provider. Ask your health care provider what activities are safe for you.  When lifting weights or doing exercises using your abdominal muscles or the muscles in the center of your body that give stability (core muscles), make sure you are doing your exercises and movements  correctly. Proper form can help to prevent the condition from happening again. General instructions  If you are overweight, ask your health care provider for help with weight loss. Losing even a small amount of weight can help to improve your diastasis recti.  Take over-the-counter or prescription medicines only as told  by your health care provider.  Do not strain. Straining can make the separation worse. Examples of straining include: ? Pushing hard to have a bowel movement, such as due to constipation. ? Lifting heavy objects, including children. ? Standing up and sitting down.  Take steps to prevent constipation: ? Drink enough fluid to keep your urine clear or pale yellow. ? Take over-the-counter or prescription medicines only as directed. ? Eat foods that are high in fiber, such as fresh fruits and vegetables, whole grains, and beans. ? Limit foods that are high in fat and processed sugars, such as fried and sweet foods. Contact a health care provider if:  You notice a new bulge in your abdomen. Get help right away if:  You experience severe discomfort in your abdomen.  You develop severe abdominal pain along with nausea, vomiting, or fever. Summary  Diastasis recti is when the abdomen (abdominal) muscles become thin and separate. Your abdomen will stick out because the space between your right and left abdomen muscles has widened.  The most common symptom is a bulge in your abdomen. You will notice it most when you sit up or are straining.  This condition is diagnosed during a physical exam.  If the abdomen separation is not too big, you may choose not to have treatment. Otherwise, you may need to undergo physical therapy or surgery. This information is not intended to replace advice given to you by your health care provider. Make sure you discuss any questions you have with your health care provider. Document Revised: 12/22/2016 Document Reviewed: 03/06/2016 Elsevier Patient Education  [place].    Spinal Stenosis  Spinal stenosis occurs when the open space (spinal canal) between the bones of your spine (vertebrae) narrows, putting pressure on the spinal cord or nerves. What are the causes? This condition is caused by areas of bone pushing into the central canals of your  vertebrae. This condition may be present at birth (congenital), or it may be caused by:  Arthritic deterioration of your vertebrae (spinal degeneration). This usually starts around age 44.  Injury or trauma to the spine.  Tumors in the spine.  Calcium deposits in the spine. What are the signs or symptoms? Symptoms of this condition include:  Pain in the neck or back that is generally worse with activities, particularly when standing and walking.  Numbness, tingling, hot or cold sensations, weakness, or weariness in your legs.  Pain going up and down the leg (sciatica).  Frequent episodes of falling.  A foot-slapping gait that leads to muscle weakness. In more serious cases, you may develop:  Problemspassing stool or passing urine.  Difficulty having sex.  Loss of feeling in part or all of your leg. Symptoms may come on slowly and get worse over time. How is this diagnosed? This condition is diagnosed based on your medical history and a physical exam. Tests will also be done, such as:  MRI.  CT scan.  X-ray. How is this treated? Treatment for this condition often focuses on managing your pain and any other symptoms. Treatment may include:  Practicing good posture to lessen pressure on your nerves.  Exercising to strengthen muscles, build endurance,  improve balance, and maintain good joint movement (range of motion).  Losing weight, if needed.  Taking medicines to reduce swelling, inflammation, or pain.  Assistive devices, such as a corset or brace. In some cases, surgery may be needed. The most common procedure is decompression laminectomy. This is done to remove excess bone that puts pressure on your nerve roots. Follow these instructions at home: Managing pain, stiffness, and swelling  Do all exercises and stretches as told by your health care provider.  Practice good posture. If you were given a brace or a corset, wear it as told by your health care  provider.  Do not do any activities that cause pain. Ask your health care provider what activities are safe for you.  Do not lift anything that is heavier than 10 lb (4.5 kg) or the limit that your health care provider tells you.  Maintain a healthy weight. Talk with your health care provider if you need help losing weight.  If directed, apply heat to the affected area as often as told by your health care provider. Use the heat source that your health care provider recommends, such as a moist heat pack or a heating pad. ? Place a towel between your skin and the heat source. ? Leave the heat on for 20-30 minutes. ? Remove the heat if your skin turns bright red. This is especially important if you are not able to feel pain, heat, or cold. You may have a greater risk of getting burned. General instructions  Take over-the-counter and prescription medicines only as told by your health care provider.  Do not use any products that contain nicotine or tobacco, such as cigarettes and e-cigarettes. If you need help quitting, ask your health care provider.  Eat a healthy diet. This includes plenty of fruits and vegetables, whole grains, and low-fat (lean) protein.  Keep all follow-up visits as told by your health care provider. This is important. Contact a health care provider if:  Your symptoms do not get better or they get worse.  You have a fever. Get help right away if:  You have new or worse pain in your neck or upper back.  You have severe pain that cannot be controlled with medicines.  You are dizzy.  You have vision problems, blurred vision, or double vision.  You have a severe headache that is worse when you stand.  You have nausea or you vomit.  You develop new or worse numbness or tingling in your back or legs.  You have pain, redness, swelling, or warmth in your arm or leg. Summary  Spinal stenosis occurs when the open space (spinal canal) between the bones of your spine  (vertebrae) narrows. This narrowing puts pressure on the spinal cord or nerves.  Spinal stenosis can cause numbness, weakness, or pain in the neck, back, and legs.  This condition may be caused by a birth defect, arthritic deterioration of your vertebrae, injury, tumors, or calcium deposits.  This condition is usually diagnosed with MRIs, CT scans, and X-rays. This information is not intended to replace advice given to you by your health care provider. Make sure you discuss any questions you have with your health care provider. Document Revised: 12/22/2016 Document Reviewed: 12/15/2015 Elsevier Patient Education  2020 [employer].

## 2019-03-11 NOTE — Progress Notes (Signed)
Subjective:  Patient ID: Adam Becker, male    DOB: 07-Sep-1955  Age: 64 y.o. 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  HPI Adam Becker presents for follow up on chronic conditions  And worsening parasthesias .  He has a history of type 2 DM,  Well controlled ,  And HIV with neurosyphilis,  Treated b infectious Disease.  He has a history of  Tingling in extremities and intermittent weakness of his left hand.  His symptoms have progressed and he reports parasthesias in all 4 extremities as well as a"pinching" quality to pain involving the cervical spine  And the toes in both feet .  2) he has also developed a nontender bulge on the right side of his mid abdomen, adjacent to his midline surgical scar fro prior splenectomy.    3) Type 2 DM with nephropathy.  He is  not  exercising regularly or trying to lose weight. Checking  blood sugars less than once daily at variable times, usually only if he feels she may be having a hypoglycemic event. .  BS have been under 130 fasting and < 150 post prandially.  Denies any recent hypoglyemic events.  Taking   medications as directed. Following a carbohydrate modified diet 6 days per week. Marland Kitchen Appetite is good.   HIV [facility]    History of Neurosyphilis: titers are periodically checked and have been low.    Outpatient Medications Prior to Visit  Medication Sig Dispense Refill  . allopurinol (ZYLOPRIM) 100 MG tablet TAKE 1 TABLET BY MOUTH EVERY DAY 90 tablet 1  . amLODipine (NORVASC) 5 MG tablet TAKE 1 TABLET (5 MG TOTAL) BY MOUTH DAILY. 30 tablet 3  . aspirin EC 81 MG tablet Take 1 tablet (81 mg total) by mouth daily. 90 tablet 3  . darunavir (PREZISTA) 800 MG tablet TAKE 1 TABLET (800 MG TOTAL) BY MOUTH DAILY. 30 tablet 11  . elvitegravir-cobicistat-emtricitabine-tenofovir (GENVOYA) 150-150-200-10 MG TABS tablet  TAKE 1 TABLET BY MOUTH EVERY DAY WITH BREAKFAST 30 tablet 11  . meclizine (ANTIVERT) 25 MG tablet Take 1 tablet (25 mg total) by mouth 3 (three) times daily as needed for dizziness. 30 tablet 2  . pravastatin (PRAVACHOL) 40 MG tablet TAKE 1 TABLET BY MOUTH EVERY DAY 90 tablet 3  . telmisartan (MICARDIS) 40 MG tablet TAKE 1 TABLET BY MOUTH EVERYDAY AT BEDTIME 90 tablet 1   No facility-administered medications prior to visit.    Review of Systems;  Patient denies headache, fevers, malaise, unintentional weight loss, skin rash, eye pain, sinus congestion and sinus pain, sore throat, dysphagia,  hemoptysis , cough, dyspnea, wheezing, chest pain, palpitations, orthopnea, edema, abdominal pain, nausea, melena, diarrhea, constipation, flank pain, dysuria, hematuria, urinary  Frequency, nocturia, numbness, tingling, seizures,  Focal weakness, Loss of consciousness,  Tremor, insomnia, depression, anxiety, and suicidal ideation.      Objective:  BP 120/78 (BP Location: Left Arm, Patient Position: Sitting, Cuff Size: Large)   Pulse 76   Temp (!) 97.2 F (36.2 C) (Temporal)   Resp 15   Ht 5\' 11"  (1.803 m)   Wt 237 lb 12.8 oz (107.9 kg)   SpO2 95%   BMI 33.17 kg/m   BP Readings from Last 3 Encounters:  03/11/19 120/78  02/27/19 140/85  11/27/18 130/87    Wt Readings from Last 3 Encounters:  03/11/19 237 lb 12.8  oz (107.9 kg)  02/27/19 237 lb (107.5 kg)  11/27/18 242 lb (109.8 kg)    General appearance: alert, cooperative and appears stated age Ears: normal TM's and external ear canals both ears Throat: lips, mucosa, and tongue normal; teeth and gums normal Neck: no adenopathy, no carotid bruit, supple, symmetrical, trachea midline and thyroid not enlarged, symmetric, no tenderness/mass/nodules Back: symmetric, no curvature. ROM normal. No CVA tenderness. Lungs: clear to auscultation bilaterally Heart: regular rate and rhythm, S1, S2 normal, no murmur, click, rub or gallop Abdomen:  soft, non-tender; bowel sounds normal; no masses,  no organomegaly Pulses: 2+ and symmetric Skin: Skin color, texture, turgor normal. No rashes or lesions Lymph nodes: Cervical, supraclavicular, and axillary nodes normal.  Lab Results  Component Value Date   HGBA1C 6.7 (H) 03/11/2019   HGBA1C 6.1 08/20/2018   HGBA1C 6.5 01/10/2018    Lab Results  Component Value Date   CREATININE 1.15 03/11/2019   CREATININE 1.11 02/27/2019   CREATININE 1.32 08/20/2018    Lab Results  Component Value Date   WBC 6.0 02/27/2019   HGB 14.6 02/27/2019   HCT 41.4 02/27/2019   PLT 319 02/27/2019   GLUCOSE 105 (H) 03/11/2019   CHOL 158 08/20/2018   TRIG 139.0 08/20/2018   HDL 37.00 (L) 08/20/2018   LDLDIRECT 99 07/26/2015   LDLCALC 93 08/20/2018   ALT 29 03/11/2019   AST 26 03/11/2019   NA 136 03/11/2019   K 4.2 03/11/2019   CL 102 03/11/2019   CREATININE 1.15 03/11/2019   BUN 14 03/11/2019   CO2 29 03/11/2019   TSH 1.43 03/11/2019   PSA 0.58 08/20/2018   HGBA1C 6.7 (H) 03/11/2019   MICROALBUR 5.7 (H) 03/11/2019    DG Chest 2 View  Result Date: 02/27/2019 CLINICAL DATA:  Chest pain. EXAM: CHEST - 2 VIEW COMPARISON:  04/16/2006.  10/04/2005. FINDINGS: Mediastinum and hilar structures normal. Stable mild cardiomegaly. No pulmonary venous congestion. Mild stable bibasilar interstitial prominence consistent chronic interstitial lung disease. No focal alveolar infiltrate. No pleural effusion or pneumothorax. IMPRESSION: Stable mild cardiomegaly. No pulmonary venous congestion. Mild stable bibasilar interstitial prominence consistent chronic interstitial lung disease. No acute abnormality identified. Electronically Signed   By: Marcello Moores  Register   On: 02/27/2019 14:04    Assessment & Plan:   Problem List Items Addressed This Visit      Unprioritized   PARESTHESIA, HANDS    ddx includes carpal tunnel syndrome,  Diabetic neuropathy, neurosyphilis and cervical spinal stenosis  (sugested by plain  films) .  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 Patient has early  microalbuminuria. Patient is tolerating statin therapy for CAD risk reduction, low dose aspirin, and  ACE inhibitor for  renal protection and hypertension .  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  He denies pain but does note that the mass is enlarging. Referral to general surgery advised.        /A total of 40 minutes was spent  with patient more than half of which was spent in counseling patient on the above mentioned issues , reviewing and explaining recent labs and imaging studies done, and coordination of care.  I am having Ethel Rana maintain his amLODipine, meclizine, aspirin EC, pravastatin, telmisartan, darunavir, Genvoya, and allopurinol.  No orders of the defined types were placed in this encounter.   There are no discontinued medications.  Follow-up: No follow-ups on file.   [name], MD

## 2019-03-13 ENCOUNTER — Encounter: Payer: Self-pay | Admitting: *Deleted

## 2019-03-13 NOTE — Addendum Note (Signed)
Addended by: [name] on: 03/13/2019 11:47 AM   Modules accepted: Orders

## 2019-03-13 NOTE — Progress Notes (Signed)
Your diabetes  remains  under excellent control currently. You have lmaintained an a1c of < 7.0 , which is our goal  Please plan to return in  4  months for follow up on diabetes.   Fasting labs should be done a day or two prior to visit so we can discuss them at your visit. Also, if you have not done so,  make sure you are seeing your eye doctor at least once a year.  Regards,   Deborra Medina, MD

## 2019-03-18 ENCOUNTER — Telehealth: Payer: Self-pay | Admitting: Internal Medicine

## 2019-03-18 MED ORDER — GABAPENTIN 100 MG PO CAPS: 100.0000 mg | ORAL_CAPSULE | Freq: Three times a day (TID) | ORAL | 3 refills | Status: DC

## 2019-03-18 NOTE — Telephone Encounter (Signed)
Pt called wanting to know if there was something he could take for his leg pain that he talked to Dr. Derrel Nip about last week  Pt said that it happening on and off and feels like a shockwave

## 2019-03-18 NOTE — Telephone Encounter (Signed)
Spoke with pt and informed him of the medication and directions that Dr. Derrel Nip sent in. Pt verbalized understanding and repeated back to me the directions with understanding.

## 2019-03-18 NOTE — Telephone Encounter (Signed)
Yes he can try gabapentin, which I have sent to his pharmacy  Start with 100 mg at bedtime  ( to avoid side effects of dizziness) and add a morning dose after three days,, then add a late afternoon dose after three days  If needed,  so that eventually he is taking it  Three times daily .  We can increase the dose gradually if needed ,  He can combine this with tylenol .

## 2019-03-31 ENCOUNTER — Telehealth: Payer: Self-pay | Admitting: Internal Medicine

## 2019-03-31 NOTE — Telephone Encounter (Signed)
He can increase the gabapentin dose to 200 mg three times daily ,  And keep the referral to neurology to determine the CAUSE of the sympotms,  Which will require nerve conduction studies

## 2019-03-31 NOTE — Telephone Encounter (Signed)
Pt states that the gabapentin (NEURONTIN) 100 MG capsule seems to be helping a little bit but not enough. Pt's legs are still hurting. Please call back to advise.

## 2019-03-31 NOTE — Telephone Encounter (Signed)
Spoke with pt and he stated that he is taking Gabapentin 100 mg TID. He can till that it is helping some, but is still having pain in his legs.

## 2019-03-31 NOTE — Telephone Encounter (Signed)
Spoke with pt and advised him to increase his gabapentin to 200 mg TID. Pt gave a verbal understanding and stated that he has a Neurology appt scheduled for 04/15/2019.

## 2019-04-07 ENCOUNTER — Telehealth: Payer: Self-pay | Admitting: Internal Medicine

## 2019-04-07 MED ORDER — GABAPENTIN 100 MG PO CAPS: 100.0000 mg | ORAL_CAPSULE | Freq: Three times a day (TID) | ORAL | 3 refills | Status: DC

## 2019-04-07 NOTE — Telephone Encounter (Signed)
Pt needs a refill on gabapentin (NEURONTIN) 100 MG capsule. Pt has two pills left

## 2019-04-08 ENCOUNTER — Other Ambulatory Visit: Payer: Self-pay

## 2019-04-08 MED ORDER — GABAPENTIN 100 MG PO CAPS: 200.0000 mg | ORAL_CAPSULE | Freq: Three times a day (TID) | ORAL | 3 refills | Status: DC

## 2019-04-08 NOTE — Telephone Encounter (Signed)
Pt called and needs a new rx for Gabapentin staing that he is taking 200mg  3 times daily

## 2019-04-08 NOTE — Telephone Encounter (Signed)
Is it okay to send in a new rx stating 200 mg TID?

## 2019-04-08 NOTE — Telephone Encounter (Signed)
Medication sent with increased dose/amount

## 2019-04-08 NOTE — Addendum Note (Signed)
Addended by: [name] on: 04/08/2019 05:39 PM   Modules accepted: Orders

## 2019-04-15 DIAGNOSIS — R202 Paresthesia of skin: Secondary | ICD-10-CM | POA: Diagnosis not present

## 2019-04-15 DIAGNOSIS — E531 Pyridoxine deficiency: Secondary | ICD-10-CM | POA: Diagnosis not present

## 2019-04-15 DIAGNOSIS — E519 Thiamine deficiency, unspecified: Secondary | ICD-10-CM | POA: Diagnosis not present

## 2019-04-15 DIAGNOSIS — E538 Deficiency of other specified B group vitamins: Secondary | ICD-10-CM | POA: Diagnosis not present

## 2019-04-15 DIAGNOSIS — E559 Vitamin D deficiency, unspecified: Secondary | ICD-10-CM | POA: Diagnosis not present

## 2019-04-15 DIAGNOSIS — R200 Anesthesia of skin: Secondary | ICD-10-CM | POA: Diagnosis not present

## 2019-04-17 ENCOUNTER — Telehealth: Payer: Self-pay | Admitting: Internal Medicine

## 2019-04-17 ENCOUNTER — Other Ambulatory Visit: Payer: Self-pay

## 2019-04-17 NOTE — Telephone Encounter (Signed)
FYI

## 2019-04-17 NOTE — Telephone Encounter (Signed)
Patient called, he saw the neurologist on Tuesday. Neurologistt thinks medication may need to be changed. Patient is set up for a office visit on 04/21/2019.

## 2019-04-19 IMAGING — DX DG ABDOMEN 1V
2 series · 2 of 2 positions shown · non-contrast
Comparison: 03/14/2018

CLINICAL DATA: Right lower abdominal pain.

EXAM:
ABDOMEN - 1 VIEW

[abdomen standing ap (1 of 2)]
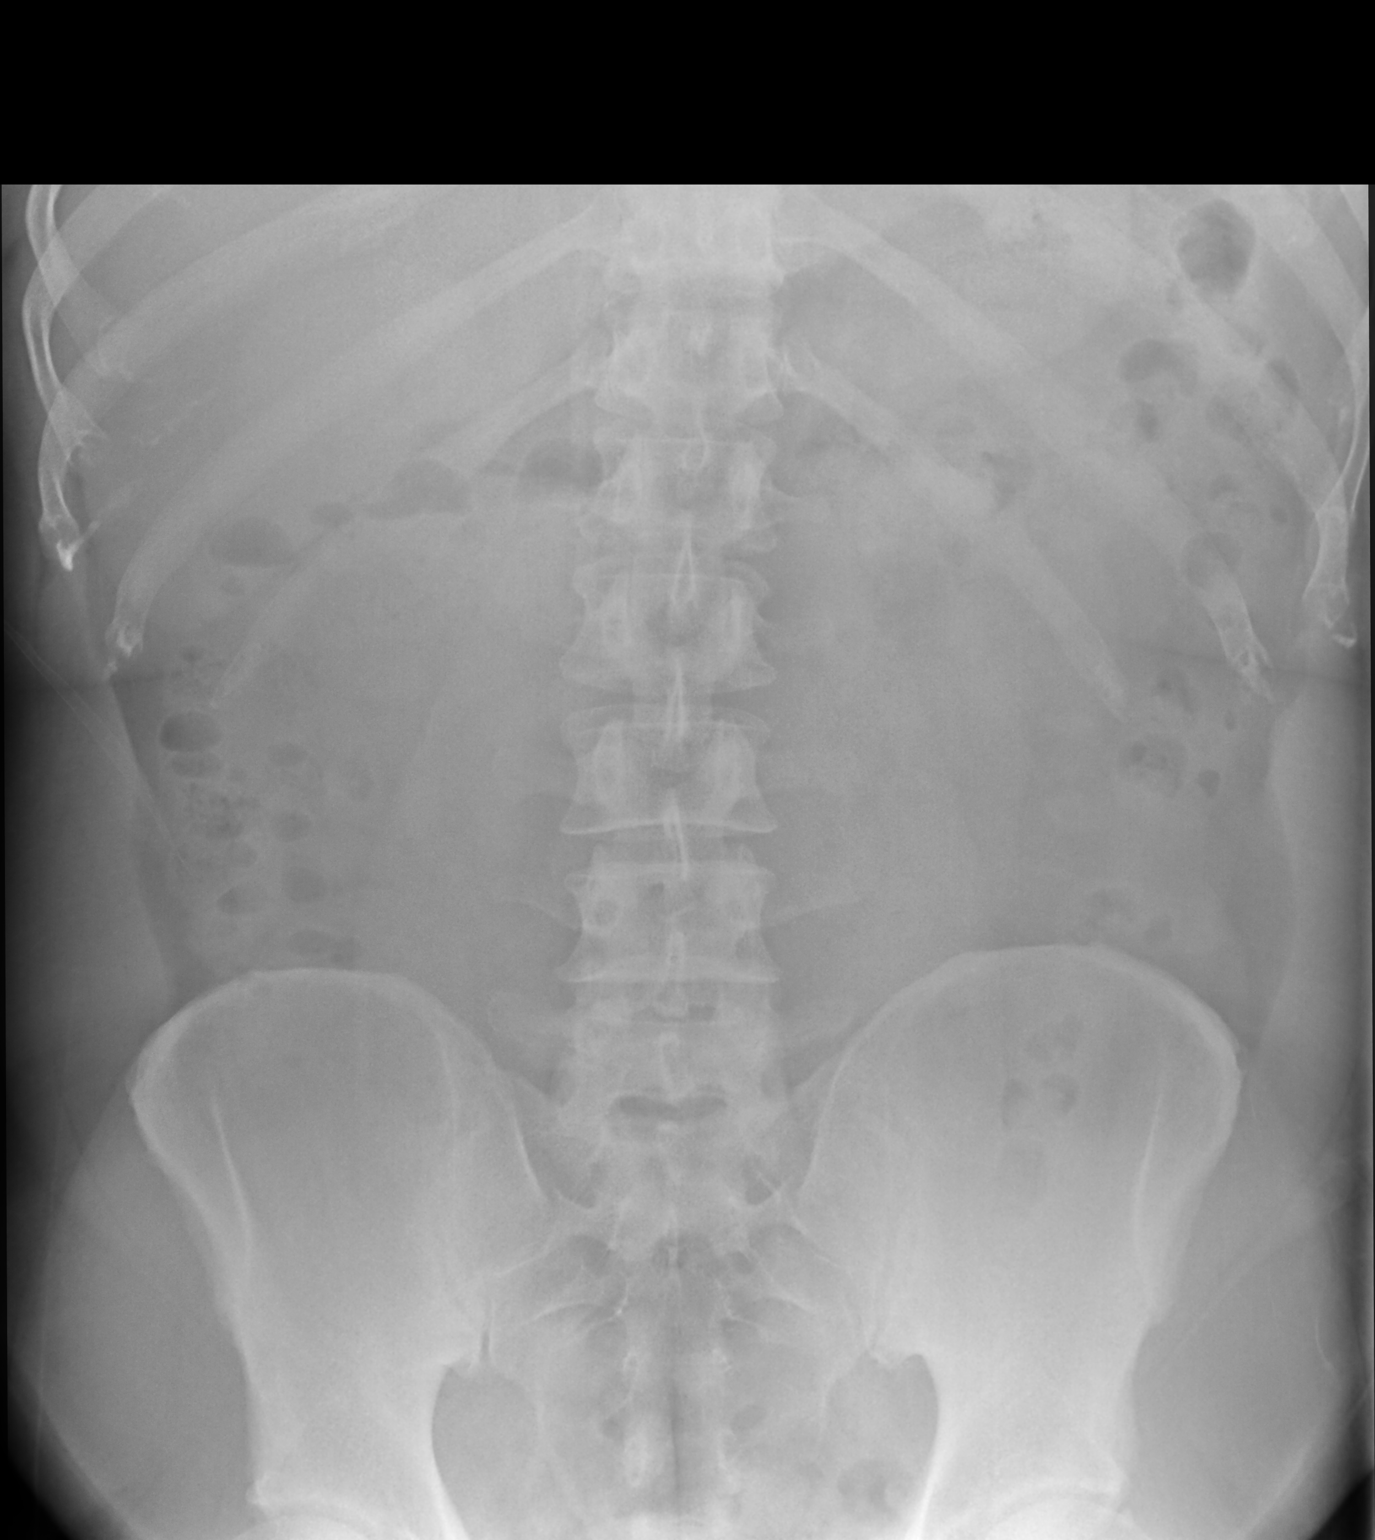

[abdomen standing ap (2 of 2)]
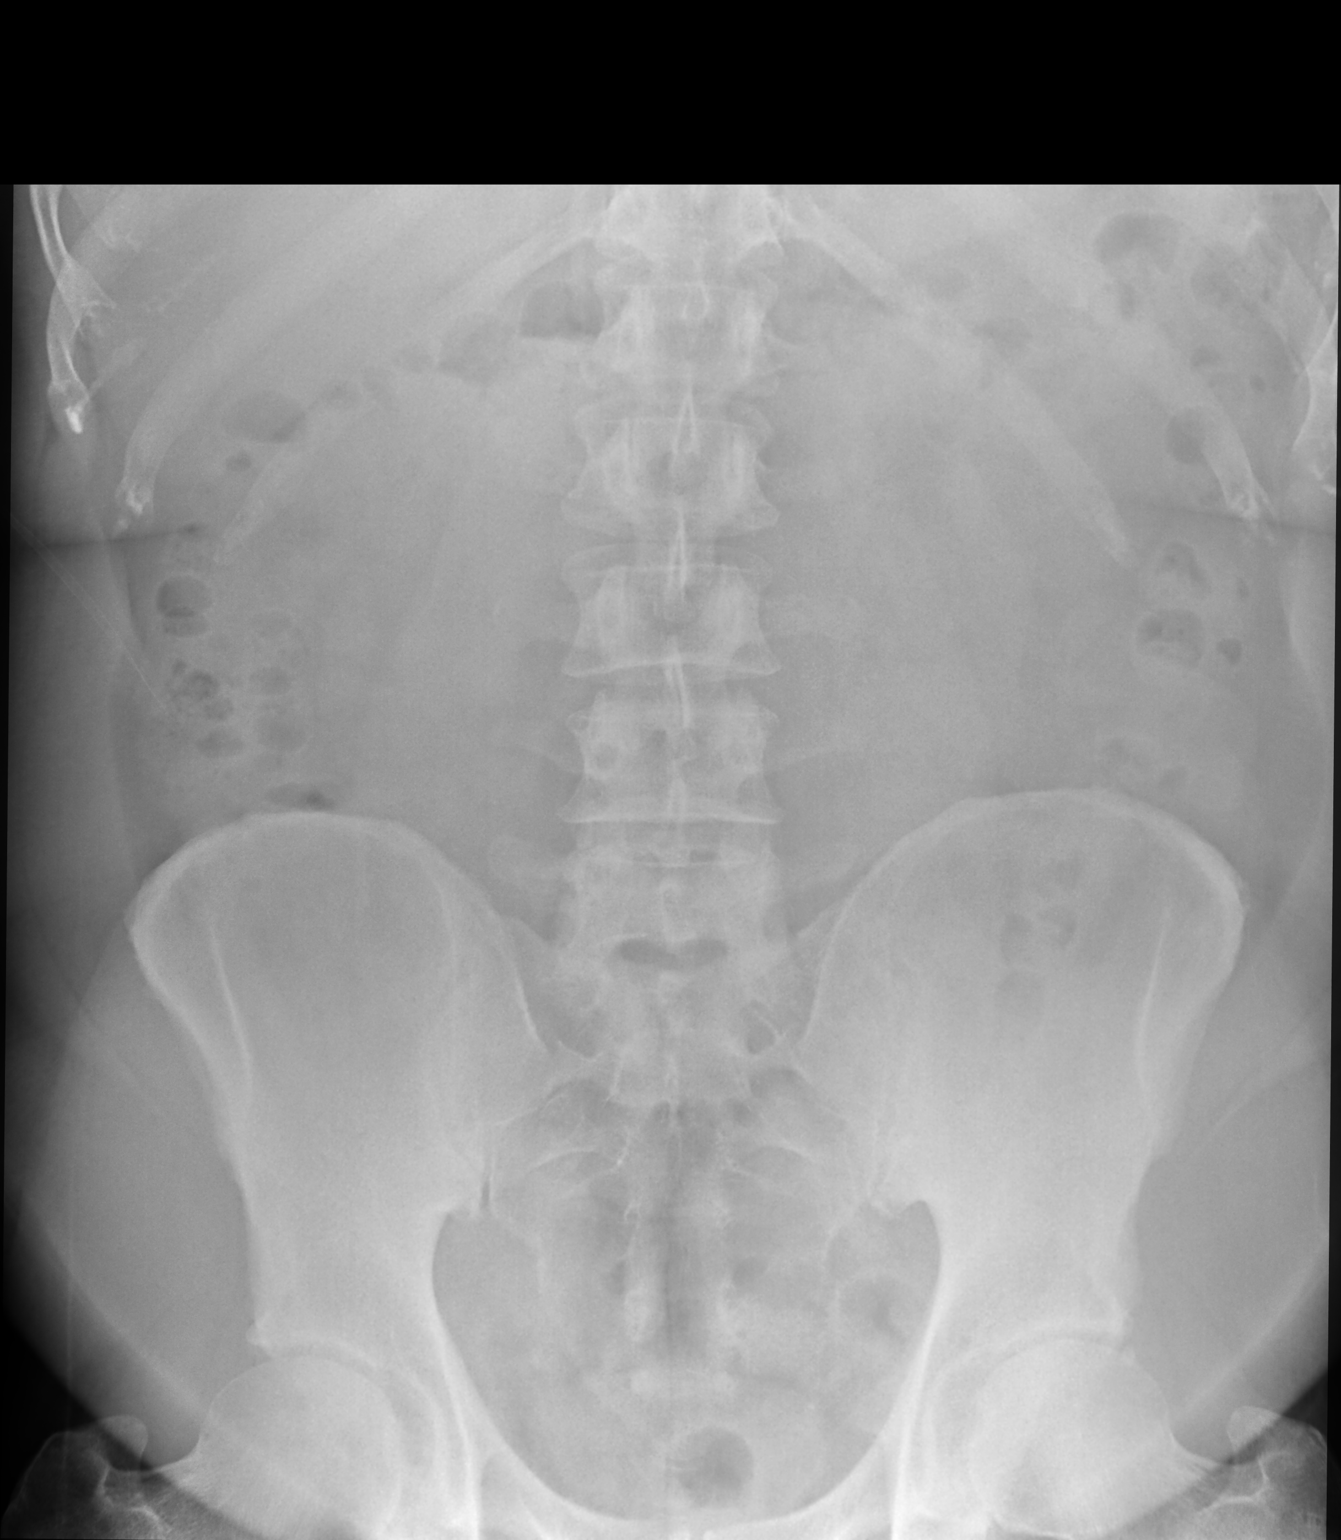

[2 of 2 positions shown; findings below may reference images not displayed]

FINDINGS: Bowel gas pattern is nonobstructive. Few air-fluid levels over the
colon. No free peritoneal air. No mass or mass effect. Remainder the
exam is unchanged.
IMPRESSION: Nonobstructive bowel gas pattern.

## 2019-04-21 ENCOUNTER — Ambulatory Visit (INDEPENDENT_AMBULATORY_CARE_PROVIDER_SITE_OTHER): Payer: Medicare Other | Admitting: Internal Medicine

## 2019-04-21 ENCOUNTER — Other Ambulatory Visit: Payer: Self-pay

## 2019-04-21 VITALS — BP 122/78 | HR 76 | Temp 98.0°F | Ht 71.0 in | Wt 240.6 lb

## 2019-04-21 DIAGNOSIS — M5412 Radiculopathy, cervical region: Secondary | ICD-10-CM

## 2019-04-21 DIAGNOSIS — R209 Unspecified disturbances of skin sensation: Secondary | ICD-10-CM

## 2019-04-21 NOTE — Patient Instructions (Addendum)
Your nerve pain may be coming from diabetes,  HIV  Or a pinched nerve in your neck.  I am ordering the cervical spine MRI to rule that pinched nerve out.    1) You can increase the gabapentin to 300 mg three times daily.    If not tolerated ,  And if yoru cervical spine  MRI shows that the disks are pushing on nerve roots,  Then we can try a  6 day course of prednisone  (this will transiently  help cervical disk issues  But not if the pain is coming from the diabetes or HIV_  2) MRI cervical spine has been  ordered .    3) You cannot harm yourself by walking  Keep  Doing it!

## 2019-04-21 NOTE — Progress Notes (Signed)
Subjective:  Patient ID: Adam Becker, male    DOB: 09-19-1955  Age: 64 y.o. MRN: [id number]  CC: The primary encounter diagnosis was PARESTHESIA, HANDS. A diagnosis of Cervical radiculitis was also pertinent to this visit.  HPI Adam Becker presents for follow up on neuropathic pain affecting both legs.  This visit occurred during the SARS-CoV-2 public health emergency.  Safety protocols were in place, including screening questions prior to the visit, additional usage of staff PPE, and extensive cleaning of exam room while observing appropriate contact time as indicated for disinfecting solutions.    Patient has received both doses of the [place] 19 vaccine without complications.  Patient continues to mask when outside of the home except when walking in yard or at safe distances from others .  Patient denies any change in mood or development of unhealthy behaviors resuting from the pandemic's restriction of activities and socialization.    He is a 64 yr old male with medical history that includes HIV, controlled with HAART, type 2 DM, and chronic neck pain who presents for follow up on pain consistent with peripheral neuropathy involving both legs and arms.  The pain is described as a "pinching " feeling has started affecting his arms and back  After starting in the feet and lower legs.  He has noticed transient weakness and numbness in his hands. His leg pain is aggravated by walking for more than 15 minutes.   Has chronic stiffness in the neck and pain that is aggrqvated by prolonged neck extension  And turning of head.  The pain in his limbs is episodic and occurs in various places, never the same place twice  ,  Right wrist left leg  Taking gabapentin 200 mg three times daily along with tylenol.  No side effects.      Has seen neurology,  EMG studies ordered . And screening labs .  Has not had mri cervical spine yet.  Discussed the role of MRI in determining if pain is  referred from cervical spinal stenosis given its random episodic occurrence.   Outpatient Medications Prior to Visit  Medication Sig Dispense Refill  . allopurinol (ZYLOPRIM) 100 MG tablet TAKE 1 TABLET BY MOUTH EVERY DAY 90 tablet 1  . amLODipine (NORVASC) 5 MG tablet TAKE 1 TABLET (5 MG TOTAL) BY MOUTH DAILY. 30 tablet 3  . aspirin EC 81 MG tablet Take 1 tablet (81 mg total) by mouth daily. 90 tablet 3  . darunavir (PREZISTA) 800 MG tablet TAKE 1 TABLET (800 MG TOTAL) BY MOUTH DAILY. 30 tablet 11  . elvitegravir-cobicistat-emtricitabine-tenofovir (GENVOYA) 150-150-200-10 MG TABS tablet TAKE 1 TABLET BY MOUTH EVERY DAY WITH BREAKFAST 30 tablet 11  . gabapentin (NEURONTIN) 100 MG capsule Take 2 capsules (200 mg total) by mouth 3 (three) times daily. 180 capsule 3  . pravastatin (PRAVACHOL) 40 MG tablet TAKE 1 TABLET BY MOUTH EVERY DAY 90 tablet 3  . telmisartan (MICARDIS) 40 MG tablet TAKE 1 TABLET BY MOUTH EVERYDAY AT BEDTIME 90 tablet 1  . meclizine (ANTIVERT) 25 MG tablet Take 1 tablet (25 mg total) by mouth 3 (three) times daily as needed for dizziness. (Patient not taking: Reported on 04/21/2019) 30 tablet 2   No facility-administered medications prior to visit.    Review of Systems;  Patient denies headache, fevers, malaise, unintentional weight loss, skin rash, eye pain, sinus congestion and sinus pain, sore throat, dysphagia,  hemoptysis , cough, dyspnea, wheezing, chest pain, palpitations, orthopnea, edema, abdominal  pain, nausea, melena, diarrhea, constipation, flank pain, dysuria, hematuria, urinary  Frequency, nocturia, numbness, tingling, seizures,  Focal weakness, Loss of consciousness,  Tremor, insomnia, depression, anxiety, and suicidal ideation.      Objective:  BP 122/78   Pulse 76   Temp 98 F (36.7 C) (Temporal)   Ht 5\' 11"  (1.803 m)   Wt 240 lb 9.6 oz (109.1 kg)   SpO2 97%   BMI 33.56 kg/m   BP Readings from Last 3 Encounters:  04/21/19 122/78  03/11/19 120/78    02/27/19 140/85    Wt Readings from Last 3 Encounters:  04/21/19 240 lb 9.6 oz (109.1 kg)  03/11/19 237 lb 12.8 oz (107.9 kg)  02/27/19 237 lb (107.5 kg)    General appearance: alert, cooperative and appears stated age Ears: normal TM's and external ear canals both ears Throat: lips, mucosa, and tongue normal; teeth and gums normal Neck: no adenopathy, no carotid bruit, supple, symmetrical, trachea midline and thyroid not enlarged, symmetric, no tenderness/mass/nodules Back: symmetric, no curvature. ROM normal. No CVA tenderness. Lungs: clear to auscultation bilaterally Heart: regular rate and rhythm, S1, S2 normal, no murmur, click, rub or gallop Abdomen: soft, non-tender; bowel sounds normal; no masses,  no organomegaly Pulses: 2+ and symmetric in feet,  DPs and TP's  Skin: Skin color, texture, turgor normal. No rashes or lesions, no hair loss  Lymph nodes: Cervical, supraclavicular, and axillary nodes normal.  Lab Results  Component Value Date   HGBA1C 6.7 (H) 03/11/2019   HGBA1C 6.1 08/20/2018   HGBA1C 6.5 01/10/2018    Lab Results  Component Value Date   CREATININE 1.15 03/11/2019   CREATININE 1.11 02/27/2019   CREATININE 1.32 08/20/2018    Lab Results  Component Value Date   WBC 6.0 02/27/2019   HGB 14.6 02/27/2019   HCT 41.4 02/27/2019   PLT 319 02/27/2019   GLUCOSE 105 (H) 03/11/2019   CHOL 158 08/20/2018   TRIG 139.0 08/20/2018   HDL 37.00 (L) 08/20/2018   LDLDIRECT 99 07/26/2015   LDLCALC 93 08/20/2018   ALT 29 03/11/2019   AST 26 03/11/2019   NA 136 03/11/2019   K 4.2 03/11/2019   CL 102 03/11/2019   CREATININE 1.15 03/11/2019   BUN 14 03/11/2019   CO2 29 03/11/2019   TSH 1.43 03/11/2019   PSA 0.58 08/20/2018   HGBA1C 6.7 (H) 03/11/2019   MICROALBUR 5.7 (H) 03/11/2019    DG Chest 2 View  Result Date: 02/27/2019 CLINICAL DATA:  Chest pain. EXAM: CHEST - 2 VIEW COMPARISON:  04/16/2006.  10/04/2005. FINDINGS: Mediastinum and hilar structures  normal. Stable mild cardiomegaly. No pulmonary venous congestion. Mild stable bibasilar interstitial prominence consistent chronic interstitial lung disease. No focal alveolar infiltrate. No pleural effusion or pneumothorax. IMPRESSION: Stable mild cardiomegaly. No pulmonary venous congestion. Mild stable bibasilar interstitial prominence consistent chronic interstitial lung disease. No acute abnormality identified. Electronically Signed   By: Marcello Moores  Register   On: 02/27/2019 14:04    Assessment & Plan:   Problem List Items Addressed This Visit      Unprioritized   PARESTHESIA, HANDS - Primary    ddx includes carpal tunnel syndrome,  Diabetic neuropathy, neurosyphilis and cervical spinal stenosis  (sugested by plain films) .  Referral to neurology for evaluation including EMG/Nerve conduction studies ,and MRI cervical spine ordered to rule  out spinal or foraminal stenosis.  Advised to increase gabapentin to 300 mg tid for symptoms managemebt       Relevant Orders  MR Cervical Spine Wo Contrast    Other Visit Diagnoses    Cervical radiculitis       Relevant Orders   MR Cervical Spine Wo Contrast     I provided  30 minutes of  face-to-face time during this encounter reviewing patient's current problems and past surgeries, labs and imaging studies, providing counseling on the above mentioned problems , and coordination  of care .  I am having Ethel Rana maintain his amLODipine, meclizine, aspirin EC, pravastatin, telmisartan, darunavir, Genvoya, allopurinol, and gabapentin.  No orders of the defined types were placed in this encounter.   There are no discontinued medications.  Follow-up: Return in about 4 weeks (around 05/19/2019).   [name], MD

## 2019-04-22 NOTE — Assessment & Plan Note (Signed)
ddx includes carpal tunnel syndrome,  Diabetic neuropathy, neurosyphilis and cervical spinal stenosis  (sugested by plain films) .  Referral to neurology for evaluation including EMG/Nerve conduction studies ,and MRI cervical spine ordered to rule  out spinal or foraminal stenosis.  Advised to increase gabapentin to 300 mg tid for symptoms managemebt

## 2019-04-24 ENCOUNTER — Other Ambulatory Visit: Payer: Self-pay | Admitting: Internal Medicine

## 2019-04-28 ENCOUNTER — Telehealth: Payer: Self-pay | Admitting: Internal Medicine

## 2019-04-28 MED ORDER — PREDNISONE 10 MG PO TABS: ORAL_TABLET | ORAL | 0 refills | Status: DC

## 2019-04-28 MED ORDER — GABAPENTIN 300 MG PO CAPS: 300.0000 mg | ORAL_CAPSULE | Freq: Three times a day (TID) | ORAL | 2 refills | Status: DC

## 2019-04-28 NOTE — Telephone Encounter (Signed)
Pt needs a refill on gabapentin (NEURONTIN) 100 MG capsule -wants 300mg  capsule so he does not have to take so many and also wants to know if she was going to call in prednisone. He also wants PCP to know that he is still having shocks and tingling in legs/arms.

## 2019-04-28 NOTE — Telephone Encounter (Signed)
Gabapentin dose increased to 300 mg three times daily  Prednisone prolonged taper added   Keep appt for mri cervical spine coming up next week

## 2019-04-29 ENCOUNTER — Telehealth: Payer: Self-pay

## 2019-04-29 NOTE — Telephone Encounter (Signed)
Patient called to inform provider that he is going to a neurologist, per PCP recommendations, for continued shooting pain in his legs. Currently taking gabapentin. Patient has MRI scheduled for 15th. Called patient and left voicemail letting him know that RN would forward message to provider.   Thanvi Blincoe Lorita Officer, RN

## 2019-04-29 NOTE — Telephone Encounter (Signed)
Ok thanks 

## 2019-05-06 ENCOUNTER — Other Ambulatory Visit: Payer: Self-pay

## 2019-05-06 ENCOUNTER — Ambulatory Visit
Admission: RE | Admit: 2019-05-06 | Discharge: 2019-05-06 | Disposition: A | Discharge status: Home / Self Care | Payer: Medicare Other | Source: Ambulatory Visit | Attending: Internal Medicine | Admitting: Internal Medicine

## 2019-05-06 DIAGNOSIS — R209 Unspecified disturbances of skin sensation: Secondary | ICD-10-CM | POA: Diagnosis not present

## 2019-05-06 DIAGNOSIS — M50221 Other cervical disc displacement at C4-C5 level: Secondary | ICD-10-CM | POA: Diagnosis not present

## 2019-05-06 DIAGNOSIS — M47812 Spondylosis without myelopathy or radiculopathy, cervical region: Secondary | ICD-10-CM | POA: Diagnosis not present

## 2019-05-06 DIAGNOSIS — M5412 Radiculopathy, cervical region: Secondary | ICD-10-CM | POA: Diagnosis not present

## 2019-05-07 NOTE — Progress Notes (Signed)
Your MRI noted some  degenerative changes at multiple levels have caused bone spurs that  could  be periodically pushing on nerve roots as they come out from the spinal cord.  This could cause pain that radiates to either arm.   The neurologist evaluation should include a nerve conduction study to help determine if your current symptoms are due to this cervical spine issue or due to an unrelated issue such as carpal tunnel syndrome.  Regards,

## 2019-05-20 ENCOUNTER — Ambulatory Visit (INDEPENDENT_AMBULATORY_CARE_PROVIDER_SITE_OTHER): Payer: Medicare Other | Admitting: Internal Medicine

## 2019-05-20 ENCOUNTER — Other Ambulatory Visit: Payer: Self-pay

## 2019-05-20 ENCOUNTER — Encounter: Payer: Self-pay | Admitting: Internal Medicine

## 2019-05-20 DIAGNOSIS — R209 Unspecified disturbances of skin sensation: Secondary | ICD-10-CM | POA: Diagnosis not present

## 2019-05-20 DIAGNOSIS — G629 Polyneuropathy, unspecified: Secondary | ICD-10-CM

## 2019-05-20 MED ORDER — CELECOXIB 200 MG PO CAPS: 200.0000 mg | ORAL_CAPSULE | Freq: Every day | ORAL | 1 refills | Status: DC

## 2019-05-20 NOTE — Patient Instructions (Addendum)
  You can add up to 2000 mg of acetominophen (tylenol) every day safely  In divided doses (500 mg every 6 hours  Or 1000 mg every 12 hours.)  Trial of celebrex once every other dayy  As your anti inflammatroy  Return on or after May 18 for labs

## 2019-05-20 NOTE — Assessment & Plan Note (Signed)
MRI of cervical spine did not show any cord flattening,  But mild DDD causing bilateral foraminal narrowing as follows:  IMPRESSION: 1. Multilevel cervical spondylosis as described, greatest at C6-7 and C7-T1. 2. Moderate right and mild left foraminal narrowing at C4-5. Mild right and moderate left foraminal narrowing at C6-7. 3. At C7-T1, there is mild-to-moderate foraminal narrowing bilaterally due to uncinate spurring and facet hypertrophy. 4. No cord deformity or abnormal cord signal.

## 2019-05-20 NOTE — Assessment & Plan Note (Signed)
Etiology remains unclear.  He has no evidence of spinal stenosis, only  foraminal stenosis of the cervical spine.  Symptoms resolved transiently with prednisone taper but are starting to return .  Continue gabapentin and tylenol ,  Add celebrex 200 mg every other day. Marland Kitchen  Await EMG/[place] studies. Next week

## 2019-05-20 NOTE — Progress Notes (Signed)
Subjective:  Patient ID: Adam Becker, male    DOB: 11-May-1955  Age: 64 y.o. MRN: [id number]  CC: Diagnoses of PARESTHESIA, HANDS and Neuropathy were pertinent to this visit.  HPI Adam Becker presents for discussion of MRI results and persistent neuropathy   This visit occurred during the SARS-CoV-2 public health emergency.  Safety protocols were in place, including screening questions prior to the visit, additional usage of staff PPE, and extensive cleaning of exam room while observing appropriate contact time as indicated for disinfecting solutions.    Patient has received both doses of the available COVID 19 vaccine without complications.  Patient continues to mask when outside of the home except when walking in yard or at safe distances from others .  Patient denies any change in mood or development of unhealthy behaviors resuting from the pandemic's restriction of activities and socialization.     MRI of Cervical spine reviewed.    He is scheduled by Dr Manuella Ghazi to have EMG [place] studies May 4 and May 5 for the nerve conduction studies of both arms and legs  Using gabapentin 300 mg tid.  ,  Has helped " a little bit"  ,  Made him dizzy initially using  tylenol as well.  The prednisone helped the most.  But 2 days after stopping the prednisone,  The tingling sharp jabbing pains have returned but to a lesser degree  In the knees and feet . Marland Kitchen  Has occasional sharp pains in the forearm and wrist brought on by use of hands .    Recalls that 20 years ago developed a hump in neck due to the early HIV medication,  And had cosmetic surgery  Liposuction) at [facility] to remove the hump .    Outpatient Medications Prior to Visit  Medication Sig Dispense Refill  . allopurinol (ZYLOPRIM) 100 MG tablet TAKE 1 TABLET BY MOUTH EVERY DAY 90 tablet 1  . amLODipine (NORVASC) 5 MG tablet TAKE 1 TABLET (5 MG TOTAL) BY MOUTH DAILY. 30 tablet 3  . aspirin EC 81 MG tablet Take 1 tablet (81 mg total) by mouth  daily. 90 tablet 3  . darunavir (PREZISTA) 800 MG tablet TAKE 1 TABLET (800 MG TOTAL) BY MOUTH DAILY. 30 tablet 11  . elvitegravir-cobicistat-emtricitabine-tenofovir (GENVOYA) 150-150-200-10 MG TABS tablet TAKE 1 TABLET BY MOUTH EVERY DAY WITH BREAKFAST 30 tablet 11  . gabapentin (NEURONTIN) 100 MG capsule Take 200 mg by mouth 3 (three) times daily.    Marland Kitchen gabapentin (NEURONTIN) 300 MG capsule Take 1 capsule (300 mg total) by mouth 3 (three) times daily. 90 capsule 2  . pravastatin (PRAVACHOL) 40 MG tablet TAKE 1 TABLET BY MOUTH EVERY DAY 90 tablet 3  . telmisartan (MICARDIS) 40 MG tablet TAKE 1 TABLET BY MOUTH EVERYDAY AT BEDTIME 90 tablet 1  . Vitamin D, Ergocalciferol, (DRISDOL) 1.25 MG (50000 UNIT) CAPS capsule Take 50,000 Units by mouth once a week.    . meclizine (ANTIVERT) 25 MG tablet Take 1 tablet (25 mg total) by mouth 3 (three) times daily as needed for dizziness. (Patient not taking: Reported on 04/21/2019) 30 tablet 2  . predniSONE (DELTASONE) 10 MG tablet 6 tablets daily for 3 days, then reduce by 1 tablet daily until gone (Patient not taking: Reported on 05/20/2019) 33 tablet 0   No facility-administered medications prior to visit.    Review of Systems;  Patient denies headache, fevers, malaise, unintentional weight loss, skin rash, eye pain, sinus congestion and sinus pain,  sore throat, dysphagia,  hemoptysis , cough, dyspnea, wheezing, chest pain, palpitations, orthopnea, edema, abdominal pain, nausea, melena, diarrhea, constipation, flank pain, dysuria, hematuria, urinary  Frequency, nocturia, numbness, tingling, seizures,  Focal weakness, Loss of consciousness,  Tremor, insomnia, depression, anxiety, and suicidal ideation.      Objective:  BP 132/76 (BP Location: Left Arm, Patient Position: Sitting, Cuff Size: Large)   Pulse 82   Temp 98.1 F (36.7 C) (Temporal)   Resp 15   Ht 5\' 11"  (1.803 m)   Wt 243 lb 3.2 oz (110.3 kg)   SpO2 97%   BMI 33.92 kg/m   BP Readings from  Last 3 Encounters:  05/20/19 132/76  04/21/19 122/78  03/11/19 120/78    Wt Readings from Last 3 Encounters:  05/20/19 243 lb 3.2 oz (110.3 kg)  04/21/19 240 lb 9.6 oz (109.1 kg)  03/11/19 237 lb 12.8 oz (107.9 kg)    General appearance: alert, cooperative and appears stated age Ears: normal TM's and external ear canals both ears Throat: lips, mucosa, and tongue normal; teeth and gums normal Neck: no adenopathy, no carotid bruit, supple, symmetrical, trachea midline and thyroid not enlarged, symmetric, no tenderness/mass/nodules Back: symmetric, no curvature. ROM normal. No CVA tenderness. Lungs: clear to auscultation bilaterally Heart: regular rate and rhythm, S1, S2 normal, no murmur, click, rub or gallop Abdomen: soft, non-tender; bowel sounds normal; no masses,  no organomegaly Pulses: 2+ and symmetric Skin: Skin color, texture, turgor normal. No rashes or lesions Lymph nodes: Cervical, supraclavicular, and axillary nodes normal.  Lab Results  Component Value Date   HGBA1C 6.7 (H) 03/11/2019   HGBA1C 6.1 08/20/2018   HGBA1C 6.5 01/10/2018    Lab Results  Component Value Date   CREATININE 1.15 03/11/2019   CREATININE 1.11 02/27/2019   CREATININE 1.32 08/20/2018    Lab Results  Component Value Date   WBC 6.0 02/27/2019   HGB 14.6 02/27/2019   HCT 41.4 02/27/2019   PLT 319 02/27/2019   GLUCOSE 105 (H) 03/11/2019   CHOL 158 08/20/2018   TRIG 139.0 08/20/2018   HDL 37.00 (L) 08/20/2018   LDLDIRECT 99 07/26/2015   LDLCALC 93 08/20/2018   ALT 29 03/11/2019   AST 26 03/11/2019   NA 136 03/11/2019   K 4.2 03/11/2019   CL 102 03/11/2019   CREATININE 1.15 03/11/2019   BUN 14 03/11/2019   CO2 29 03/11/2019   TSH 1.43 03/11/2019   PSA 0.58 08/20/2018   HGBA1C 6.7 (H) 03/11/2019   MICROALBUR 5.7 (H) 03/11/2019    MR Cervical Spine Wo Contrast  Result Date: 05/07/2019 CLINICAL DATA:  Cervical radiculopathy. Numbness and tingling in both hands for 5 months. No  known injury or prior relevant surgery. EXAM: MRI CERVICAL SPINE WITHOUT CONTRAST TECHNIQUE: Multiplanar, multisequence MR imaging of the cervical spine was performed. No intravenous contrast was administered. COMPARISON:  Cervical spine radiographs 03/11/2019 FINDINGS: Alignment: 3 mm of degenerative anterolisthesis at C7-T1. Straightening without other focal angulation or listhesis. Vertebrae: No acute or suspicious osseous findings. Cord: Normal in signal and caliber. Posterior Fossa, vertebral arteries, paraspinal tissues: Visualized portions of the posterior fossa and paraspinal soft tissues appear unremarkable. Bilateral vertebral artery flow voids. Disc levels: C2-3: The disc appears normal. Mild left-sided facet hypertrophy. No spinal stenosis or nerve root encroachment. C3-4: Mild bilateral uncinate spurring. No significant spinal stenosis or nerve root encroachment. C4-5: Mild loss of disc height with bilateral uncinate spurring. Moderate right and mild left foraminal narrowing. No cord deformity. C5-6:  Mild loss of disc height with bilateral uncinate spurring. Mild foraminal narrowing bilaterally. No cord deformity. C6-7: Spondylosis with loss of disc height, posterior osteophytes and asymmetric uncinate spurring on the left. Mild right and moderate left foraminal narrowing. C7-T1: Moderate bilateral facet hypertrophy accounting for the grade 1 anterolisthesis. Loss of disc height with annular disc bulging. The CSF surrounding the cord is effaced without cord deformity. Mild to moderate foraminal narrowing bilaterally. IMPRESSION: 1. Multilevel cervical spondylosis as described, greatest at C6-7 and C7-T1. 2. Moderate right and mild left foraminal narrowing at C4-5. Mild right and moderate left foraminal narrowing at C6-7. 3. At C7-T1, there is mild-to-moderate foraminal narrowing bilaterally due to uncinate spurring and facet hypertrophy. 4. No cord deformity or abnormal cord signal. Electronically  Signed   By: Richardean Sale M.D.   On: 05/07/2019 09:34    Assessment & Plan:   Problem List Items Addressed This Visit      Unprioritized   Neuropathy    Etiology remains unclear.  He has no evidence of spinal stenosis, only  foraminal stenosis of the cervical spine.  Symptoms resolved transiently with prednisone taper but are starting to return .  Continue gabapentin and tylenol ,  Add celebrex 200 mg every other day. Marland Kitchen  Await EMG/[place] studies. Next week       PARESTHESIA, HANDS    MRI of cervical spine did not show any cord flattening,  But mild DDD causing bilateral foraminal narrowing as follows:  IMPRESSION: 1. Multilevel cervical spondylosis as described, greatest at C6-7 and C7-T1. 2. Moderate right and mild left foraminal narrowing at C4-5. Mild right and moderate left foraminal narrowing at C6-7. 3. At C7-T1, there is mild-to-moderate foraminal narrowing bilaterally due to uncinate spurring and facet hypertrophy. 4. No cord deformity or abnormal cord signal.         I have discontinued Marjory Lies. Bones's meclizine and predniSONE. I am also having him start on celecoxib. Additionally, I am having him maintain his amLODipine, aspirin EC, pravastatin, darunavir, Genvoya, allopurinol, telmisartan, gabapentin, gabapentin, and Vitamin D (Ergocalciferol).  Meds ordered this encounter  Medications  . celecoxib (CELEBREX) 200 MG capsule    Sig: Take 1 capsule (200 mg total) by mouth daily.    Dispense:  30 capsule    Refill:  1    Medications Discontinued During This Encounter  Medication Reason  . meclizine (ANTIVERT) 25 MG tablet Patient has not taken in last 30 days  . predniSONE (DELTASONE) 10 MG tablet Completed Course  .[id number]  Follow-up: No follow-ups on file.   [name], MD

## 2019-05-27 DIAGNOSIS — R202 Paresthesia of skin: Secondary | ICD-10-CM | POA: Diagnosis not present

## 2019-05-27 DIAGNOSIS — R200 Anesthesia of skin: Secondary | ICD-10-CM | POA: Diagnosis not present

## 2019-05-28 DIAGNOSIS — R200 Anesthesia of skin: Secondary | ICD-10-CM | POA: Diagnosis not present

## 2019-05-28 DIAGNOSIS — R202 Paresthesia of skin: Secondary | ICD-10-CM | POA: Diagnosis not present

## 2019-05-29 ENCOUNTER — Other Ambulatory Visit (INDEPENDENT_AMBULATORY_CARE_PROVIDER_SITE_OTHER): Payer: Medicare Other

## 2019-05-29 ENCOUNTER — Other Ambulatory Visit: Payer: Self-pay

## 2019-05-29 DIAGNOSIS — R809 Proteinuria, unspecified: Secondary | ICD-10-CM | POA: Diagnosis not present

## 2019-05-29 DIAGNOSIS — E1129 Type 2 diabetes mellitus with other diabetic kidney complication: Secondary | ICD-10-CM | POA: Diagnosis not present

## 2019-05-29 LAB — COMPREHENSIVE METABOLIC PANEL
ALT: 30 U/L (ref 0–53)
AST: 24 U/L (ref 0–37)
Albumin: 4.1 g/dL (ref 3.5–5.2)
Alkaline Phosphatase: 42 U/L (ref 39–117)
BUN: 12 mg/dL (ref 6–23)
CO2: 31 mEq/L (ref 19–32)
Calcium: 9.2 mg/dL (ref 8.4–10.5)
Chloride: 103 mEq/L (ref 96–112)
Creatinine, Ser: 1.21 mg/dL (ref 0.40–1.50)
GFR: 73.37 mL/min (ref >60.00)
Glucose, Bld: 131 mg/dL — ABNORMAL HIGH (ref 70–99)
Potassium: 4.7 mEq/L (ref 3.5–5.1)
Sodium: 137 mEq/L (ref 135–145)
Total Bilirubin: 0.4 mg/dL (ref 0.2–1.2)
Total Protein: 7.4 g/dL (ref 6.0–8.3)

## 2019-05-29 LAB — HEMOGLOBIN A1C: Hgb A1c MFr Bld: 6.4 % (ref 4.6–6.5)

## 2019-05-29 LAB — LIPID PANEL
Cholesterol: 184 mg/dL (ref 0–200)
HDL: 41.5 mg/dL (ref >39.00)
LDL Cholesterol: 116 mg/dL — ABNORMAL HIGH (ref 0–99)
NonHDL: 142.88
Total CHOL/HDL Ratio: 4
Triglycerides: 135 mg/dL (ref 0.0–149.0)
VLDL: 27 mg/dL (ref 0.0–40.0)

## 2019-06-02 ENCOUNTER — Other Ambulatory Visit: Payer: Self-pay

## 2019-06-02 MED ORDER — CELECOXIB 200 MG PO CAPS: 200.0000 mg | ORAL_CAPSULE | Freq: Every day | ORAL | 1 refills | Status: DC

## 2019-06-11 DIAGNOSIS — S92534A Nondisplaced fracture of distal phalanx of right lesser toe(s), initial encounter for closed fracture: Secondary | ICD-10-CM | POA: Diagnosis not present

## 2019-06-11 DIAGNOSIS — M79671 Pain in right foot: Secondary | ICD-10-CM | POA: Diagnosis not present

## 2019-06-16 ENCOUNTER — Encounter: Payer: Self-pay | Admitting: Family Medicine

## 2019-06-16 ENCOUNTER — Other Ambulatory Visit: Payer: Self-pay

## 2019-06-16 ENCOUNTER — Telehealth: Payer: Self-pay | Admitting: Family Medicine

## 2019-06-16 ENCOUNTER — Ambulatory Visit (INDEPENDENT_AMBULATORY_CARE_PROVIDER_SITE_OTHER): Payer: Medicare Other | Admitting: Family Medicine

## 2019-06-16 DIAGNOSIS — S91209A Unspecified open wound of unspecified toe(s) with damage to nail, initial encounter: Secondary | ICD-10-CM

## 2019-06-16 DIAGNOSIS — S92534A Nondisplaced fracture of distal phalanx of right lesser toe(s), initial encounter for closed fracture: Secondary | ICD-10-CM

## 2019-06-16 NOTE — Patient Instructions (Addendum)
Nice to see you. Please monitor your toe.  If you develop any signs of infection such as redness, drainage of pus, increasing swelling, increasing pain, or fevers please seek medical attention immediately. Please do not use hydrogen peroxide on it as that kills good tissue as well.

## 2019-06-16 NOTE — Telephone Encounter (Signed)
I called and spoke with the patient and informed him that his toe was fractured and he needed to follow up with [facility] for treatment, patient understood and stated he would go tomorrow.  Letizia Hook,cma

## 2019-06-16 NOTE — Assessment & Plan Note (Addendum)
Patient has already undergone x-ray.  Reports no fracture.  We will request those records.  Discussed that at this point it is a wound care.  Advised against peroxide use as it can kill good tissue as well as infection.  Discussed topical triple antibiotic ointment.  Discussed keeping it dressed.  Advised he could stay in the postop shoe if desired.  Advised to monitor for signs of infection and seek medical attention if he develops infection.

## 2019-06-16 NOTE — Telephone Encounter (Signed)
Please call the patient. We received his records from the urgent care. It appears he did have a fracture of his second toe on his right foot. He needs to remain in the post op shoe. He needs to follow-up with orthopedics and I would suggest [facility] today or tomorrow between 1-7 to receive follow-up treatment for his injury.

## 2019-06-16 NOTE — Progress Notes (Signed)
  Tommi Rumps, MD Phone: [phone]  Adam Becker is a 64 y.o. male who presents today for same-day visit.  Right second toe injury: Patient notes about a week or so ago he dropped a board onto his right second toe.  Took the toenail off.  He went to urgent care for this and they did an x-ray which he states revealed no fracture.  They placed him in a postop shoe.  They advised him to take Tylenol or ibuprofen for pain.  He wanted it looked at again.  He wanted to make sure there was no sign of infection.  It is sore.  There has been no redness.  No purulent drainage.  No fevers.  He has been using peroxide on it.  He does note it looks better today than it did previously.  Social History   Tobacco Use  Smoking Status Never Smoker  Smokeless Tobacco Never Used     ROS see history of present illness  Objective  Physical Exam Vitals:   06/16/19 1500  BP: 140/90  Pulse: 82  Temp: 97.6 F (36.4 C)  SpO2: 98%    BP Readings from Last 3 Encounters:  06/16/19 140/90  05/20/19 132/76  04/21/19 122/78   Wt Readings from Last 3 Encounters:  06/16/19 249 lb 3.2 oz (113 kg)  05/20/19 243 lb 3.2 oz (110.3 kg)  04/21/19 240 lb 9.6 oz (109.1 kg)    Physical Exam Constitutional:      General: He is not in acute distress.    Appearance: He is not diaphoretic.  Pulmonary:     Effort: Pulmonary effort is normal.  Skin:    General: Skin is warm and dry.  Neurological:     Mental Status: He is alert.    Right second toenail, no surrounding erythema, slight tenderness, no purulent drainage    Assessment/Plan: Please see individual problem list.  Avulsion of toenail of right foot Patient has already undergone x-ray.  Reports no fracture.  We will request those records.  Discussed that at this point it is a wound care.  Advised against peroxide use as it can kill good tissue as well as infection.  Discussed topical triple antibiotic ointment.  Discussed keeping it  dressed.  Advised he could stay in the postop shoe if desired.  Advised to monitor for signs of infection and seek medical attention if he develops infection.    No orders of the defined types were placed in this encounter.   No orders of the defined types were placed in this encounter.   This visit occurred during the SARS-CoV-2 public health emergency.  Safety protocols were in place, including screening questions prior to the visit, additional usage of staff PPE, and extensive cleaning of exam room while observing appropriate contact time as indicated for disinfecting solutions.    Tommi Rumps, MD [place]

## 2019-06-17 DIAGNOSIS — S92531B Displaced fracture of distal phalanx of right lesser toe(s), initial encounter for open fracture: Secondary | ICD-10-CM | POA: Diagnosis not present

## 2019-06-24 DIAGNOSIS — S92911D Unspecified fracture of right toe(s), subsequent encounter for fracture with routine healing: Secondary | ICD-10-CM | POA: Diagnosis not present

## 2019-06-24 DIAGNOSIS — S92919A Unspecified fracture of unspecified toe(s), initial encounter for closed fracture: Secondary | ICD-10-CM | POA: Insufficient documentation

## 2019-06-30 ENCOUNTER — Ambulatory Visit: Payer: Medicare Other | Admitting: Internal Medicine

## 2019-07-03 DIAGNOSIS — S92911D Unspecified fracture of right toe(s), subsequent encounter for fracture with routine healing: Secondary | ICD-10-CM | POA: Diagnosis not present

## 2019-07-25 ENCOUNTER — Encounter: Payer: Self-pay | Admitting: Emergency Medicine

## 2019-07-25 ENCOUNTER — Telehealth: Payer: Self-pay | Admitting: Internal Medicine

## 2019-07-25 ENCOUNTER — Other Ambulatory Visit: Payer: Self-pay

## 2019-07-25 ENCOUNTER — Emergency Department
Admission: EM | Admit: 2019-07-25 | Discharge: 2019-07-25 | Disposition: A | Discharge status: Home / Self Care | Payer: Medicare Other | Attending: Emergency Medicine | Admitting: Emergency Medicine

## 2019-07-25 ENCOUNTER — Encounter: Payer: Self-pay | Admitting: Internal Medicine

## 2019-07-25 DIAGNOSIS — E1121 Type 2 diabetes mellitus with diabetic nephropathy: Secondary | ICD-10-CM | POA: Insufficient documentation

## 2019-07-25 DIAGNOSIS — Z21 Asymptomatic human immunodeficiency virus [HIV] infection status: Secondary | ICD-10-CM | POA: Diagnosis not present

## 2019-07-25 DIAGNOSIS — N182 Chronic kidney disease, stage 2 (mild): Secondary | ICD-10-CM | POA: Diagnosis not present

## 2019-07-25 DIAGNOSIS — Z8546 Personal history of malignant neoplasm of prostate: Secondary | ICD-10-CM | POA: Insufficient documentation

## 2019-07-25 DIAGNOSIS — Z79899 Other long term (current) drug therapy: Secondary | ICD-10-CM | POA: Insufficient documentation

## 2019-07-25 DIAGNOSIS — M79605 Pain in left leg: Secondary | ICD-10-CM | POA: Insufficient documentation

## 2019-07-25 DIAGNOSIS — I129 Hypertensive chronic kidney disease with stage 1 through stage 4 chronic kidney disease, or unspecified chronic kidney disease: Secondary | ICD-10-CM | POA: Insufficient documentation

## 2019-07-25 DIAGNOSIS — G608 Other hereditary and idiopathic neuropathies: Secondary | ICD-10-CM | POA: Insufficient documentation

## 2019-07-25 DIAGNOSIS — E1165 Type 2 diabetes mellitus with hyperglycemia: Secondary | ICD-10-CM | POA: Diagnosis not present

## 2019-07-25 DIAGNOSIS — E1122 Type 2 diabetes mellitus with diabetic chronic kidney disease: Secondary | ICD-10-CM | POA: Diagnosis not present

## 2019-07-25 DIAGNOSIS — Z7982 Long term (current) use of aspirin: Secondary | ICD-10-CM | POA: Diagnosis not present

## 2019-07-25 MED ORDER — PREDNISONE 10 MG (21) PO TBPK: ORAL_TABLET | ORAL | 0 refills | Status: DC

## 2019-07-25 NOTE — ED Provider Notes (Signed)
[facility] Emergency Department Provider Note   ____________________________________________   I have reviewed the triage vital signs and the nursing notes.   HISTORY  Chief Complaint Leg Pain   History limited by: Not Limited   HPI Adam Becker is a 64 y.o. male who presents to the emergency department today at the advice of outpatient provider because of concerns for more persistent left leg pain.  Patient states that he has had pain to the medial aspect of his left lower leg for roughly 8 months.  He states that typically it is intermittent however for the past few days it has been more constant.  He says that sometimes walking will make the pain better.  Additionally bending over will sometimes make the pain worse.  The patient denies any weakness or difficulty with gait with this pain.  He denies any urinary retention, bowel incontinence. Denies any recent trauma or excessive lifting.   Records reviewed. Per medical record review patient has a history of outpatient visits to neurology for this pain.   Past Medical History:  Diagnosis Date  . 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 Chronic kidney disease Nov 2013   admission for ATN cr 11, acidosis  . HIV infection ([place])   . Hyperglycemia 04/04/2016  . Hyperlipidemia   . Hypertension   . Impaired fasting glucose 07/27/2015  . Urethritis 02/11/2015    Patient Active Problem List   Diagnosis Date Noted  . 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 Avulsion of toenail of right foot 06/16/2019  . Abdominal wall mass 03/11/2019  . Family history of colon cancer in father 08/10/2018  . Right flank pain 02/09/2018  . Neuropathy 01/12/2018  . 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 Elevated liver function tests 11/27/2017  . Vertigo 09/11/2017  . TIA (transient ischemic attack) 08/18/2017  . Need for immunization against influenza 10/10/2016   . Prostate cancer screening 10/10/2016  . 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 Routine adult health maintenance 04/23/2014  . Allergic rhinitis due to allergen 04/23/2014  . 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 Gout attack 09/20/2012  . Obesity (BMI 30.0-34.9) 12/09/2011  . Hyperlipidemia 11/01/2009  . NEUROSYPHILIS 03/11/2009  . PARESTHESIA, HANDS 06/18/2008  . LATE SYPHILIS, LATENT 10/30/2007  . ERECTILE DYSFUNCTION, MILD 12/31/2006  . HYPERTENSION, BENIGN ESSENTIAL 10/15/2006  . Human immunodeficiency virus (HIV) disease ([place]) 04/16/2006    Past Surgical History:  Procedure Laterality Date  . COLONOSCOPY    . LIPOSUCTION HEAD / NECK  2001   posterior neck  . SPLENECTOMY  2000   reason unclear "stopped working"    Prior to Admission medications   Medication Sig Start Date End Date Taking? Authorizing Provider  allopurinol (ZYLOPRIM) 100 MG tablet TAKE 1 TABLET BY MOUTH EVERY DAY 02/28/19   [name], MD  amLODipine (NORVASC) 5 MG tablet TAKE 1 TABLET (5 MG TOTAL) BY MOUTH DAILY. 09/09/15   [name], MD  aspirin EC 81 MG tablet Take 1 tablet (81 mg total) by mouth daily. 08/22/18   [name], FNP  celecoxib (CELEBREX) 200 MG capsule Take 1 capsule (200 mg total) by mouth daily. 06/02/19   [name], MD  darunavir (PREZISTA) 800 MG tablet TAKE 1 TABLET (800 MG TOTAL) BY MOUTH DAILY. 11/27/18   Truman Hayward, MD  elvitegravir-cobicistat-emtricitabine-tenofovir (GENVOYA) 150-150-200-10 MG TABS tablet TAKE 1 TABLET BY MOUTH EVERY  DAY WITH BREAKFAST 11/27/18   [name], [name], MD  gabapentin (NEURONTIN) 100 MG capsule Take 200 mg by mouth 3 (three) times daily. 05/05/19   [provider]  gabapentin (NEURONTIN) 300 MG capsule Take 1 capsule (300 mg total) by mouth 3 (three) times daily. 04/28/19   [name], MD  pravastatin (PRAVACHOL) 40 MG tablet TAKE 1 TABLET BY MOUTH EVERY DAY 09/12/18   [name], [name], MD  predniSONE (STERAPRED UNI-PAK 21 TAB) 10 MG (21) TBPK tablet Per packaging instructions 07/25/19   Nance Pear, MD  telmisartan (MICARDIS) 40 MG tablet TAKE 1 TABLET BY MOUTH EVERYDAY AT BEDTIME 04/24/19   [name], MD  Vitamin D, Ergocalciferol, (DRISDOL) 1.25 MG (50000 UNIT) CAPS capsule Take 50,000 Units by mouth once a week. 04/29/19   [provider]    Allergies Patient has no known allergies.  Family History  Problem Relation Age of Onset  . Cancer Mother   . Breast cancer Mother   . Colon cancer Father   . Birth defects Son        Breast     Social History Social History   Tobacco Use  . Smoking status: Never Smoker  . Smokeless tobacco: Never Used  Vaping Use  . Vaping Use: Never used  Substance Use Topics  . Alcohol use: No  . Drug use: No    Review of Systems Constitutional: No fever/chills Eyes: No visual changes. ENT: No sore throat. Cardiovascular: Denies chest pain. Respiratory: Denies shortness of breath. Gastrointestinal: No abdominal pain.  No nausea, no vomiting.  No diarrhea.   Genitourinary: Negative for dysuria. Musculoskeletal: Positive for left leg pain. Skin: Negative for rash. Neurological: Negative for headaches, focal weakness or numbness.  ____________________________________________   PHYSICAL EXAM:  VITAL SIGNS: ED Triage Vitals  Enc Vitals Group     BP 07/25/19 1305 (!) 145/81     Pulse Rate 07/25/19 1305 75     Resp 07/25/19 1305 16     Temp 07/25/19 1305 98.3 F (36.8 C)     Temp Source 07/25/19 1305 Oral     SpO2 07/25/19 1305 96 %     Weight 07/25/19 1304 249 lb 1.9 oz (113 kg)     Height 07/25/19 1304 5\' 11"  (1.803 m)     Head Circumference --      Peak Flow --      Pain Score 07/25/19 1303 9   Constitutional: Alert and oriented.  Eyes: Conjunctivae are normal.  ENT      Head: Normocephalic and atraumatic.      Nose: No congestion/rhinnorhea.      Mouth/Throat: Mucous  membranes are moist.      Neck: No stridor. Hematological/Lymphatic/Immunilogical: No cervical lymphadenopathy. Cardiovascular: Normal rate, regular rhythm.  No murmurs, rubs, or gallops.  Respiratory: Normal respiratory effort without tachypnea nor retractions. Breath sounds are clear and equal bilaterally. No wheezes/rales/rhonchi. Gastrointestinal: Soft and non tender. No rebound. No guarding.  Genitourinary: Deferred Musculoskeletal: Normal range of motion in all extremities. No lower extremity edema. DP pulse 2+ bilaterally. Neurologic:  Normal speech and language. Strength 5/5 in bilateral lower extremities. No gross focal neurologic deficits are appreciated.  Skin:  Skin is warm, dry and intact. No rash noted. Psychiatric: Mood and affect are normal. Speech and behavior are normal. Patient exhibits appropriate insight and judgment.  ____________________________________________    LABS (pertinent positives/negatives)  None  ____________________________________________   EKG  None  ____________________________________________  RADIOLOGY  None  ____________________________________________   PROCEDURES  Procedures  ____________________________________________   INITIAL IMPRESSION / ASSESSMENT AND PLAN / ED COURSE  Pertinent labs & imaging results that were available during my care of the patient were reviewed by me and considered in my medical decision making (see chart for details).   Patient presented to the emergency department today at the request of outpatient provider because of concerns for more persistent left leg pain.  Per chart review and per the patient's report there was concerns for possible spinal cord compression.  At this point however have extremely low concern for spinal cord compression given that the patient is not complaining of any weakness, difficulty with gait, bowel or bladder change and given that the symptom has been present for over  half a year.  On exam patient has good strength as well as pulses in bilateral lower extremities.  I do wonder if patient is suffering from peripheral nerve impingement/inflammation.  I discussed this with the patient.  Will give patient course of steroids.  At this point do not feel any emergent imaging is necessary.  I did discuss with patient strict return precautions for spinal cord compression symptoms however again at this point patient lacks any concerning symptoms. ____________________________________________   FINAL CLINICAL IMPRESSION(S) / ED DIAGNOSES  Final diagnoses:  Left leg pain     Note: This dictation was prepared with Dragon dictation. Any transcriptional errors that result from this process are unintentional     Nance Pear, MD 07/25/19 1557

## 2019-07-25 NOTE — ED Triage Notes (Signed)
C/O left calf pain since November 2020.  Pain has been intermittent.  States pain keeps him up at night.  Pain returned a couple of days ago.  States he takes gabapentin x 3 months, no relief in symptoms.

## 2019-07-25 NOTE — Discharge Instructions (Addendum)
Please seek medical attention for any high fevers, chest pain, shortness of breath, change in behavior, persistent vomiting, bloody stool or any other new or concerning symptoms.  

## 2019-07-25 NOTE — ED Notes (Signed)
Pt states he has been having intermittent lower left leg pain since November. Pt states that for the past 2 days pain has gotten worse and is constant. Pt denies weakness in leg. No changes in bowel/ bladder habits. Pt denies injury.

## 2019-07-25 NOTE — Telephone Encounter (Signed)
Pt called in and said he saw the neurologist and they told him that the pain running down leg is his muscles. He said the last few nights it has been constant and it is shooting down his legs. He said they prescribed him ropinirole and he started them yesterday but he don't seem to think this is going to work and wants to know if Dr. Derrel Nip would prescribe him something else. He would like a call back.

## 2019-07-25 NOTE — Telephone Encounter (Signed)
Spoke with pt and he stated that he spoke with the neurologist and they told him that since his pain has gotten and is now constant that he should go on over to the ED. Pt is on his way to ED now. He stated that the neurologist told him that the pain had something to do with his spinal cord.

## 2019-07-25 NOTE — Telephone Encounter (Signed)
NO, NOT WITHOUT BEING SEEN. HONESTLY HE HAS NOT GIVEN THE MEDICATION A LONG ENOUGH TRIAL TO SAY IT HAS NOT WORKED SO HE NEEDS TO TAKE IT NIGHTLY FOR A WEEK

## 2019-07-26 ENCOUNTER — Other Ambulatory Visit: Payer: Self-pay | Admitting: Internal Medicine

## 2019-07-30 ENCOUNTER — Other Ambulatory Visit: Payer: Self-pay | Admitting: Neurology

## 2019-07-30 DIAGNOSIS — R200 Anesthesia of skin: Secondary | ICD-10-CM

## 2019-07-30 DIAGNOSIS — R202 Paresthesia of skin: Secondary | ICD-10-CM

## 2019-08-11 ENCOUNTER — Ambulatory Visit (INDEPENDENT_AMBULATORY_CARE_PROVIDER_SITE_OTHER): Payer: Medicare Other

## 2019-08-11 VITALS — Ht 71.0 in | Wt 249.0 lb

## 2019-08-11 DIAGNOSIS — Z0000 Encounter for general adult medical examination without abnormal findings: Secondary | ICD-10-CM | POA: Diagnosis not present

## 2019-08-11 NOTE — Progress Notes (Addendum)
Subjective:   Adam Becker is a 64 y.o. male who presents for Medicare Annual/Subsequent preventive examination.  Review of Systems    No ROS.  Medicare Wellness Virtual Visit.   Cardiac Risk Factors include: advanced age (>59men, >16 women);hypertension;male gender;diabetes mellitus     Objective:    Today's Vitals   08/11/19 0837  Weight: 249 lb (112.9 kg)  Height: 5\' 11"  (1.803 m)   Body mass index is 34.73 kg/m.  Advanced Directives 08/11/2019 07/25/2019 02/27/2019 08/08/2018 08/18/2017 07/31/2017 07/25/2016  Does Patient Have a Medical Advance Directive? No No No No No No No  Would patient like information on creating a medical advance directive? No - Patient declined No - Patient declined No - Patient declined No - Patient declined No - Patient declined Yes (MAU/Ambulatory/Procedural Areas - Information given) No - Patient declined    Current Medications (verified) Outpatient Encounter Medications as of 08/11/2019  Medication Sig  . allopurinol (ZYLOPRIM) 100 MG tablet TAKE 1 TABLET BY MOUTH EVERY DAY  . amLODipine (NORVASC) 5 MG tablet TAKE 1 TABLET (5 MG TOTAL) BY MOUTH DAILY.  Marland Kitchen aspirin EC 81 MG tablet Take 1 tablet (81 mg total) by mouth daily.  . celecoxib (CELEBREX) 200 MG capsule Take 1 capsule (200 mg total) by mouth daily.  . darunavir (PREZISTA) 800 MG tablet TAKE 1 TABLET (800 MG TOTAL) BY MOUTH DAILY.  Marland Kitchen elvitegravir-cobicistat-emtricitabine-tenofovir (GENVOYA) 150-150-200-10 MG TABS tablet TAKE 1 TABLET BY MOUTH EVERY DAY WITH BREAKFAST  . gabapentin (NEURONTIN) 100 MG capsule Take 200 mg by mouth 3 (three) times daily.  Marland Kitchen gabapentin (NEURONTIN) 300 MG capsule TAKE 1 CAPSULE BY MOUTH THREE TIMES A DAY  . pravastatin (PRAVACHOL) 40 MG tablet TAKE 1 TABLET BY MOUTH EVERY DAY  . predniSONE (STERAPRED UNI-PAK 21 TAB) 10 MG (21) TBPK tablet Per packaging instructions  . telmisartan (MICARDIS) 40 MG tablet TAKE 1 TABLET BY MOUTH EVERYDAY AT BEDTIME  . Vitamin D,  Ergocalciferol, (DRISDOL) 1.25 MG (50000 UNIT) CAPS capsule Take 50,000 Units by mouth once a week.   No facility-administered encounter medications on file as of 08/11/2019.    Allergies (verified) Patient has no known allergies.   History: Past Medical History:  Diagnosis Date  . 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 Chronic kidney disease Nov 2013   admission for [facility] cr 11, acidosis  . HIV infection ([place])   . Hyperglycemia 04/04/2016  . Hyperlipidemia   . Hypertension   . Impaired fasting glucose 07/27/2015  . Urethritis 02/11/2015   Past Surgical History:  Procedure Laterality Date  . COLONOSCOPY    . LIPOSUCTION HEAD / NECK  2001   posterior neck  . SPLENECTOMY  2000   reason unclear "stopped working"   Family History  Problem Relation Age of Onset  . Cancer Mother   . Breast cancer Mother   . Colon cancer Father   . Birth defects Son        Breast    Social History   Socioeconomic History  . Marital status: Single    Spouse name: Not on file  . Number of children: Not on file  . Years of education: Not on file  . Highest education level: Not on file  Occupational History  . Not on file  Tobacco Use  . Smoking status: Never Smoker  . Smokeless tobacco: Never Used  Vaping Use  . Vaping Use: Never used  Substance and Sexual Activity  . Alcohol use: No  . Drug use: No  .  Sexual activity: Not Currently    Comment: declined condoms  Other Topics Concern  . Not on file  Social History Narrative   Independent baseline. Lives by himself.  Education 12th.  Children none.  Caffeine tea one cup 4 x week.   Social Determinants of Health   Financial Resource Strain:   . Difficulty of Paying Living Expenses:   Food Insecurity: No Food Insecurity  . Worried About Charity fundraiser in the Last Year: Never true  . Ran Out of Food in the Last Year: Never true  Transportation Needs: No Transportation Needs  . Lack of Transportation (Medical): No  . Lack of Transportation  (Non-Medical): No  Physical Activity:   . Days of Exercise per Week:   . Minutes of Exercise per Session:   Stress: No Stress Concern Present  . Feeling of Stress : Not at all  Social Connections: Unknown  . Frequency of Communication with Friends and Family: More than three times a week  . Frequency of Social Gatherings with Friends and Family: More than three times a week  . Attends Religious Services: More than 4 times per year  . Active Member of Clubs or Organizations: Yes  . Attends Archivist Meetings: More than 4 times per year  . Marital Status: Not on file    Tobacco Counseling Counseling given: Not Answered   Clinical Intake:  Pre-visit preparation completed: Yes        Diabetes: Yes (Followed by pcp)  How often do you need to have someone help you when you read instructions, pamphlets, or other written materials from your doctor or pharmacy?: 1 - Never  Interpreter Needed?: No      Activities of Daily Living In your present state of health, do you have any difficulty performing the following activities: 08/11/2019  Hearing? N  Vision? N  Difficulty concentrating or making decisions? N  Walking or climbing stairs? N  Dressing or bathing? N  Doing errands, shopping? N  Preparing Food and eating ? N  Using the Toilet? N  In the past six months, have you accidently leaked urine? N  Do you have problems with loss of bowel control? N  Managing your Medications? N  Managing your Finances? N  Housekeeping or managing your Housekeeping? N  Some recent data might be hidden    Patient Care Team: [name], MD as PCP - General (Internal Medicine) [name], [name], MD as PCP - Infectious Diseases (Infectious Diseases)  Indicate any recent Medical Services you may have received from other than [facility] providers in the past year (date may be approximate).     Assessment:   This is a routine wellness examination for Adam Becker.  I connected with  Adam Becker today by telephone and verified that I am speaking with the correct person using two identifiers. Location patient: home Location provider: work Persons participating in the virtual visit: patient, Marine scientist.    I discussed the limitations, risks, security and privacy concerns of performing an evaluation and management service by telephone and the availability of in person appointments. The patient expressed understanding and verbally consented to this telephonic visit.    Interactive audio and video telecommunications were attempted between this provider and patient, however failed, due to patient having technical difficulties OR patient did not have access to video capability.  We continued and completed visit with audio only.  Some vital signs may be absent or patient reported.   Hearing/Vision screen  Hearing  Screening   125Hz  250Hz  500Hz  1000Hz  2000Hz  3000Hz  4000Hz  6000Hz  8000Hz   Right ear:           Left ear:           Comments: Patient is able to hear conversational tones without difficulty.  No issues reported.   Vision Screening Comments: [facility] Wears corrective lenses Visual acuity not assessed, virtual visit.  They have seen their ophthalmologist in the last 12 months.    Dietary issues and exercise activities discussed: Current Exercise Habits: Home exercise routine, Type of exercise: walking, Intensity: MildHe tries to have a healthy diet Good water intake  Goals      Patient Stated   .  Modified Carb Foods (pt-stated)      Portion Control       Depression Screen PHQ 2/9 Scores 08/11/2019 04/21/2019 11/27/2018 08/08/2018 11/26/2017 07/31/2017 10/10/2016  PHQ - 2 Score 0 0 0 0 0 0 0    Fall Risk Fall Risk  08/11/2019 05/20/2019 04/21/2019 03/11/2019 11/27/2018  Falls in the past year? 0 0 0 0 0  Number falls in past yr: 0 - 0 - -  Injury with Fall? - - 0 - -  Follow up Falls evaluation completed Falls evaluation completed Falls evaluation  completed Falls evaluation completed -   Handrails in use when climbing strairs? Yes  Home free of loose throw rugs in walkways, pet beds, electrical cords, etc? Yes  Adequate lighting in your home to reduce risk of falls? Yes   ASSISTIVE DEVICES UTILIZED TO PREVENT FALLS: Use of a cane, walker or w/c? No  Grab bars in the bathroom? No  Shower chair or bench in shower? No  Elevated toilet seat or a handicapped toilet? No   TIMED UP AND GO:  Was the test performed? No . Virtual visit.  Cognitive Function: MMSE - Mini Mental State Exam 07/31/2017 07/25/2016  Orientation to time 5 5  Orientation to Place 5 5  Registration 3 3  Attention/ Calculation 5 5  Recall 3 3  Language- name 2 objects 2 2  Language- repeat 1 1  Language- follow 3 step command 3 3  Language- read & follow direction 1 1  Write a sentence 1 1  Copy design 1 1  Total score 30 30     6CIT Screen 08/11/2019 08/08/2018  What Year? 0 points 0 points  What month? 0 points 0 points  What time? 0 points 0 points  Count back from 20 - 0 points  Months in reverse - 0 points  Repeat phrase - 0 points  Total Score - 0   Immunizations Immunization History  Administered Date(s) Administered  . H1N1 01/02/2008  . Hepatitis A 12/31/2006, 07/15/2007  . Hepatitis B 03/26/2001, 05/06/2001, 10/14/2001  . Influenza Inj Mdck Quad Pf 10/12/2017  . Influenza Split 11/15/2010, 12/07/2011  . Influenza Whole 10/02/2005, 10/15/2006, 11/26/2008, 11/01/2009  . Influenza,inj,Quad PF,6+ Mos 10/07/2012, 11/05/2013, 11/11/2014, 10/12/2015, 10/10/2016  . Influenza-Unspecified 09/28/2018  . Meningococcal Polysaccharide 10/12/2015  . PFIZER SARS-COV-2 Vaccination 03/28/2019, 04/18/2019  . Pneumococcal Conjugate-13 06/21/2011  . Pneumococcal Polysaccharide-23 09/23/2005, 10/02/2005, 02/29/2012    TDAP status: Due, Education has been provided regarding the importance of this vaccine. Advised may receive this vaccine at local pharmacy or  Health Dept. Aware to provide a copy of the vaccination record if obtained from local pharmacy or Health Dept. Verbalized acceptance and understanding.  Health Maintenance Health Maintenance  Topic Date Due  . Meningococcal B  Vaccine (1 of 4 - Increased Risk Bexsero 2-dose series) 10/03/1966  . TETANUS/TDAP  Never done  . COLONOSCOPY  11/08/2018  . INFLUENZA VACCINE  08/24/2019  . OPHTHALMOLOGY EXAM  10/22/2019  . HEMOGLOBIN A1C  11/29/2019  . FOOT EXAM  04/20/2020  . PNEUMOCOCCAL POLYSACCHARIDE VACCINE AGE 22-64 HIGH RISK  Completed  . COVID-19 Vaccine  Completed  . Hepatitis C Screening  Completed  . HIV Screening  Completed   Meningocoacal B vaccine- due   Dental Screening: Recommended annual dental exams for proper oral hygiene. Partials/dentures.   Community Resource Referral / Chronic Care Management: CRR required this visit?  No   CCM required this visit?  No      Plan:    Keep all routine maintenance appointments.   Plans to scheduled 6 month follow up in September.   I have personally reviewed and noted the following in the patient's chart:   . Medical and social history . Use of alcohol, tobacco or illicit drugs  . Current medications and supplements . Functional ability and status . Nutritional status . Physical activity . Advanced directives . List of other physicians . Hospitalizations, surgeries, and ER visits in previous 12 months . Vitals . Screenings to include cognitive, depression, and falls . Referrals and appointments  In addition, I have reviewed and discussed with patient certain preventive protocols, quality metrics, and best practice recommendations. A written personalized care plan for preventive services as well as general preventive health recommendations were provided to patient.     OBrien-Blaney, Tamar Miano L, LPN   8/50/2774     I have reviewed the above information and agree with above.   Deborra Medina, MD

## 2019-08-11 NOTE — Patient Instructions (Addendum)
Adam Becker , Thank you for taking time to come for your Medicare Wellness Visit. I appreciate your ongoing commitment to your health goals. Please review the following plan we discussed and let me know if I can assist you in the future.   These are the goals we discussed: Goals      Patient Stated   .  Modified Carb Foods (pt-stated)      Portion Control        This is a list of the screening recommended for you and due dates:  Health Maintenance  Topic Date Due  . Meningococcal B Vaccine (1 of 4 - Increased Risk Bexsero 2-dose series) 10/03/1966  . Tetanus Vaccine  Never done  . Colon Cancer Screening  11/08/2018  . Flu Shot  08/24/2019  . Eye exam for diabetics  10/22/2019  . Hemoglobin A1C  11/29/2019  . Complete foot exam   04/20/2020  . Pneumococcal vaccine  Completed  . COVID-19 Vaccine  Completed  .  Hepatitis C: One time screening is recommended by Center for Disease Control  (CDC) for  adults born from [birth year] through 1964.   Completed  . HIV Screening  Completed    Immunizations Immunization History  Administered Date(s) Administered  . H1N1 01/02/2008  . Hepatitis A 12/31/2006, 07/15/2007  . Hepatitis B 03/26/2001, 05/06/2001, 10/14/2001  . Influenza Inj Mdck Quad Pf 10/12/2017  . Influenza Split 11/15/2010, 12/07/2011  . Influenza Whole 10/02/2005, 10/15/2006, 11/26/2008, 11/01/2009  . Influenza,inj,Quad PF,6+ Mos 10/07/2012, 11/05/2013, 11/11/2014, 10/12/2015, 10/10/2016  . Influenza-Unspecified 09/28/2018  . Meningococcal Polysaccharide 10/12/2015  . PFIZER SARS-COV-2 Vaccination 03/28/2019, 04/18/2019  . Pneumococcal Conjugate-13 06/21/2011  . Pneumococcal Polysaccharide-23 09/23/2005, 10/02/2005, 02/29/2012   Schedule 6 month follow up with your provider.   Advanced directives: declined  Conditions/risks identified: none new  Follow up in one year for your annual wellness visit.  Preventive Care 40-64 Years, Male Preventive care refers to  lifestyle choices and visits with your health care provider that can promote health and wellness. What does preventive care include?  A yearly physical exam. This is also called an annual well check.  Dental exams once or twice a year.  Routine eye exams. Ask your health care provider how often you should have your eyes checked.  Personal lifestyle choices, including:  Daily care of your teeth and gums.  Regular physical activity.  Eating a healthy diet.  Avoiding tobacco and drug use.  Limiting alcohol use.  Practicing safe sex.  Taking low-dose aspirin every day starting at age 67. What happens during an annual well check? The services and screenings done by your health care provider during your annual well check will depend on your age, overall health, lifestyle risk factors, and family history of disease. Counseling  Your health care provider may ask you questions about your:  Alcohol use.  Tobacco use.  Drug use.  Emotional well-being.  Home and relationship well-being.  Sexual activity.  Eating habits.  Work and work Statistician. Screening  You may have the following tests or measurements:  Height, weight, and BMI.  Blood pressure.  Lipid and cholesterol levels. These may be checked every 5 years, or more frequently if you are over 63 years old.  Skin check.  Lung cancer screening. You may have this screening every year starting at age 25 if you have a 30-pack-year history of smoking and currently smoke or have quit within the past 15 years.  Fecal occult blood test (FOBT) of the stool.  You may have this test every year starting at age 41.  Flexible sigmoidoscopy or colonoscopy. You may have a sigmoidoscopy every 5 years or a colonoscopy every 10 years starting at age 40.  Prostate cancer screening. Recommendations will vary depending on your family history and other risks.  Hepatitis C blood test.  Hepatitis B blood test.  Sexually transmitted  disease (STD) testing.  Diabetes screening. This is done by checking your blood sugar (glucose) after you have not eaten for a while (fasting). You may have this done every 1-3 years. Discuss your test results, treatment options, and if necessary, the need for more tests with your health care provider. Vaccines  Your health care provider may recommend certain vaccines, such as:  Influenza vaccine. This is recommended every year.  Tetanus, diphtheria, and acellular pertussis (Tdap, Td) vaccine. You may need a Td booster every 10 years.  Zoster vaccine. You may need this after age 75.  Pneumococcal 13-valent conjugate (PCV13) vaccine. You may need this if you have certain conditions and have not been vaccinated.  Pneumococcal polysaccharide (PPSV23) vaccine. You may need one or two doses if you smoke cigarettes or if you have certain conditions. Talk to your health care provider about which screenings and vaccines you need and how often you need them. This information is not intended to replace advice given to you by your health care provider. Make sure you discuss any questions you have with your health care provider. Document Released: 02/05/2015 Document Revised: 09/29/2015 Document Reviewed: 11/10/2014 Elsevier Interactive Patient Education  2017 [place] Prevention in the Home Falls can cause injuries. They can happen to people of all ages. There are many things you can do to make your home safe and to help prevent falls. What can I do on the outside of my home?  Regularly fix the edges of walkways and driveways and fix any cracks.  Remove anything that might make you trip as you walk through a door, such as a raised step or threshold.  Trim any bushes or trees on the path to your home.  Use bright outdoor lighting.  Clear any walking paths of anything that might make someone trip, such as rocks or tools.  Regularly check to see if handrails are loose or broken. Make  sure that both sides of any steps have handrails.  Any raised decks and porches should have guardrails on the edges.  Have any leaves, snow, or ice cleared regularly.  Use sand or salt on walking paths during winter.  Clean up any spills in your garage right away. This includes oil or grease spills. What can I do in the bathroom?  Use night lights.  Install grab bars by the toilet and in the tub and shower. Do not use towel bars as grab bars.  Use non-skid mats or decals in the tub or shower.  If you need to sit down in the shower, use a plastic, non-slip stool.  Keep the floor dry. Clean up any water that spills on the floor as soon as it happens.  Remove soap buildup in the tub or shower regularly.  Attach bath mats securely with double-sided non-slip rug tape.  Do not have throw rugs and other things on the floor that can make you trip. What can I do in the bedroom?  Use night lights.  Make sure that you have a light by your bed that is easy to reach.  Do not use any sheets or blankets  that are too big for your bed. They should not hang down onto the floor.  Have a firm chair that has side arms. You can use this for support while you get dressed.  Do not have throw rugs and other things on the floor that can make you trip. What can I do in the kitchen?  Clean up any spills right away.  Avoid walking on wet floors.  Keep items that you use a lot in easy-to-reach places.  If you need to reach something above you, use a strong step stool that has a grab bar.  Keep electrical cords out of the way.  Do not use floor polish or wax that makes floors slippery. If you must use wax, use non-skid floor wax.  Do not have throw rugs and other things on the floor that can make you trip. What can I do with my stairs?  Do not leave any items on the stairs.  Make sure that there are handrails on both sides of the stairs and use them. Fix handrails that are broken or loose.  Make sure that handrails are as long as the stairways.  Check any carpeting to make sure that it is firmly attached to the stairs. Fix any carpet that is loose or worn.  Avoid having throw rugs at the top or bottom of the stairs. If you do have throw rugs, attach them to the floor with carpet tape.  Make sure that you have a light switch at the top of the stairs and the bottom of the stairs. If you do not have them, ask someone to add them for you. What else can I do to help prevent falls?  Wear shoes that:  Do not have high heels.  Have rubber bottoms.  Are comfortable and fit you well.  Are closed at the toe. Do not wear sandals.  If you use a stepladder:  Make sure that it is fully opened. Do not climb a closed stepladder.  Make sure that both sides of the stepladder are locked into place.  Ask someone to hold it for you, if possible.  Clearly mark and make sure that you can see:  Any grab bars or handrails.  First and last steps.  Where the edge of each step is.  Use tools that help you move around (mobility aids) if they are needed. These include:  Canes.  Walkers.  Scooters.  Crutches.  Turn on the lights when you go into a dark area. Replace any light bulbs as soon as they burn out.  Set up your furniture so you have a clear path. Avoid moving your furniture around.  If any of your floors are uneven, fix them.  If there are any pets around you, be aware of where they are.  Review your medicines with your doctor. Some medicines can make you feel dizzy. This can increase your chance of falling. Ask your doctor what other things that you can do to help prevent falls. This information is not intended to replace advice given to you by your health care provider. Make sure you discuss any questions you have with your health care provider. Document Released: 11/05/2008 Document Revised: 06/17/2015 Document Reviewed: 02/13/2014 Elsevier Interactive Patient  Education  2017 [employer].

## 2019-08-14 ENCOUNTER — Ambulatory Visit
Admission: RE | Admit: 2019-08-14 | Discharge: 2019-08-14 | Disposition: A | Discharge status: Home / Self Care | Payer: Medicare Other | Source: Ambulatory Visit | Attending: Neurology | Admitting: Neurology

## 2019-08-14 ENCOUNTER — Other Ambulatory Visit: Payer: Self-pay

## 2019-08-14 DIAGNOSIS — R202 Paresthesia of skin: Secondary | ICD-10-CM | POA: Diagnosis not present

## 2019-08-14 DIAGNOSIS — R200 Anesthesia of skin: Secondary | ICD-10-CM | POA: Insufficient documentation

## 2019-08-14 DIAGNOSIS — N281 Cyst of kidney, acquired: Secondary | ICD-10-CM | POA: Diagnosis not present

## 2019-08-14 DIAGNOSIS — M5126 Other intervertebral disc displacement, lumbar region: Secondary | ICD-10-CM | POA: Diagnosis not present

## 2019-08-17 ENCOUNTER — Other Ambulatory Visit: Payer: Self-pay | Admitting: Internal Medicine

## 2019-08-19 DIAGNOSIS — M79605 Pain in left leg: Secondary | ICD-10-CM | POA: Diagnosis not present

## 2019-09-01 ENCOUNTER — Other Ambulatory Visit: Payer: Self-pay | Admitting: Internal Medicine

## 2019-09-09 ENCOUNTER — Other Ambulatory Visit (HOSPITAL_COMMUNITY)
Admission: RE | Admit: 2019-09-09 | Discharge: 2019-09-09 | Disposition: A | Discharge status: Home / Self Care | Payer: Medicare Other | Source: Ambulatory Visit | Attending: Infectious Disease | Admitting: Infectious Disease

## 2019-09-09 ENCOUNTER — Other Ambulatory Visit: Payer: Medicare Other

## 2019-09-09 ENCOUNTER — Other Ambulatory Visit: Payer: Self-pay

## 2019-09-09 DIAGNOSIS — N182 Chronic kidney disease, stage 2 (mild): Secondary | ICD-10-CM

## 2019-09-09 DIAGNOSIS — A523 Neurosyphilis, unspecified: Secondary | ICD-10-CM | POA: Insufficient documentation

## 2019-09-09 DIAGNOSIS — N342 Other urethritis: Secondary | ICD-10-CM

## 2019-09-09 DIAGNOSIS — B20 Human immunodeficiency virus [HIV] disease: Secondary | ICD-10-CM | POA: Diagnosis not present

## 2019-09-09 DIAGNOSIS — E1129 Type 2 diabetes mellitus with other diabetic kidney complication: Secondary | ICD-10-CM | POA: Diagnosis not present

## 2019-09-09 DIAGNOSIS — I10 Essential (primary) hypertension: Secondary | ICD-10-CM | POA: Diagnosis not present

## 2019-09-09 DIAGNOSIS — E785 Hyperlipidemia, unspecified: Secondary | ICD-10-CM | POA: Insufficient documentation

## 2019-09-09 DIAGNOSIS — A528 Late syphilis, latent: Secondary | ICD-10-CM | POA: Diagnosis not present

## 2019-09-09 DIAGNOSIS — R809 Proteinuria, unspecified: Secondary | ICD-10-CM

## 2019-09-09 DIAGNOSIS — I129 Hypertensive chronic kidney disease with stage 1 through stage 4 chronic kidney disease, or unspecified chronic kidney disease: Secondary | ICD-10-CM | POA: Diagnosis not present

## 2019-09-09 NOTE — Addendum Note (Signed)
Addended by: Monia Sabal on: 09/09/2019 09:58 AM   Modules accepted: Orders

## 2019-09-10 LAB — URINE CYTOLOGY ANCILLARY ONLY
Chlamydia: NEGATIVE
Comment: NEGATIVE
Comment: NORMAL
Neisseria Gonorrhea: NEGATIVE

## 2019-09-10 LAB — T-HELPER CELL (CD4) - (RCID CLINIC ONLY)
CD4 % Helper T Cell: 32 % — ABNORMAL LOW (ref 33–65)
CD4 T Cell Abs: 932 /uL (ref 400–1790)

## 2019-09-11 ENCOUNTER — Telehealth: Payer: Self-pay | Admitting: Internal Medicine

## 2019-09-11 MED ORDER — GABAPENTIN 300 MG PO CAPS
600.0000 mg | ORAL_CAPSULE | Freq: Three times a day (TID) | ORAL | 5 refills | Status: DC
Start: 2019-09-11 — End: 2020-01-29

## 2019-09-11 MED ORDER — GABAPENTIN 100 MG PO CAPS: 200.0000 mg | ORAL_CAPSULE | Freq: Three times a day (TID) | ORAL | 5 refills | Status: DC

## 2019-09-11 NOTE — Telephone Encounter (Signed)
Gabapentin amount increased to 180/month

## 2019-09-11 NOTE — Telephone Encounter (Signed)
Patient called in and stated that Dr.Shaw neurologist wanted him to take 2 at night ofgabapentin (NEURONTIN) 300 MG capsule because of the shock waves going down his leg. He stated that he is going to run out of it early because of this please advise.

## 2019-09-11 NOTE — Telephone Encounter (Signed)
Spoke with pharmacy and canceled the 100 mg refill and the pt is aware that the rx has been sent in.

## 2019-09-11 NOTE — Telephone Encounter (Signed)
CORRECTED.  PLEASE CALL [facility] AND CANCEL THE 100 MG REFILL

## 2019-09-11 NOTE — Telephone Encounter (Signed)
Spoke with pt and he stated that he is currently taking a 300 mg capsule three times daily and needs to increase the night time dose to 600 mg(2 capsules). The rx that was sent in was for the 100 mg capsule.

## 2019-09-12 LAB — COMPLETE METABOLIC PANEL WITH GFR
AG Ratio: 1.4 (calc) (ref 1.0–2.5)
ALT: 33 U/L (ref 9–46)
AST: 32 U/L (ref 10–35)
Albumin: 4.4 g/dL (ref 3.6–5.1)
Alkaline phosphatase (APISO): 44 U/L (ref 35–144)
BUN/Creatinine Ratio: 14 (calc) (ref 6–22)
BUN: 18 mg/dL (ref 7–25)
CO2: 27 mmol/L (ref 20–32)
Calcium: 9.6 mg/dL (ref 8.6–10.3)
Chloride: 104 mmol/L (ref 98–110)
Creat: 1.33 mg/dL — ABNORMAL HIGH (ref 0.70–1.25)
GFR, Est African American: 66 mL/min/{1.73_m2} (ref >=60)
GFR, Est Non African American: 57 mL/min/{1.73_m2} — ABNORMAL LOW (ref >=60)
Globulin: 3.1 g/dL (calc) (ref 1.9–3.7)
Glucose, Bld: 142 mg/dL — ABNORMAL HIGH (ref 65–99)
Potassium: 4.7 mmol/L (ref 3.5–5.3)
Sodium: 139 mmol/L (ref 135–146)
Total Bilirubin: 0.4 mg/dL (ref 0.2–1.2)
Total Protein: 7.5 g/dL (ref 6.1–8.1)

## 2019-09-12 LAB — CBC WITH DIFFERENTIAL/PLATELET
Absolute Monocytes: 599 cells/uL (ref 200–950)
Basophils Absolute: 70 cells/uL (ref 0–200)
Basophils Relative: 1.3 %
Eosinophils Absolute: 281 cells/uL (ref 15–500)
Eosinophils Relative: 5.2 %
HCT: 40.4 % (ref 38.5–50.0)
Hemoglobin: 13.5 g/dL (ref 13.2–17.1)
Lymphs Abs: 2927 cells/uL (ref 850–3900)
MCH: 32.1 pg (ref 27.0–33.0)
MCHC: 33.4 g/dL (ref 32.0–36.0)
MCV: 96.2 fL (ref 80.0–100.0)
MPV: 10 fL (ref 7.5–12.5)
Monocytes Relative: 11.1 %
Neutro Abs: 1523 cells/uL (ref 1500–7800)
Neutrophils Relative %: 28.2 %
Platelets: 365 10*3/uL (ref 140–400)
RBC: 4.2 10*6/uL (ref 4.20–5.80)
RDW: 13.2 % (ref 11.0–15.0)
Total Lymphocyte: 54.2 %
WBC: 5.4 10*3/uL (ref 3.8–10.8)

## 2019-09-12 LAB — LIPID PANEL
Cholesterol: 183 mg/dL (ref <200)
HDL: 37 mg/dL — ABNORMAL LOW (ref >=40)
LDL Cholesterol (Calc): 119 mg/dL (calc) — ABNORMAL HIGH
Non-HDL Cholesterol (Calc): 146 mg/dL (calc) — ABNORMAL HIGH (ref <130)
Total CHOL/HDL Ratio: 4.9 (calc) (ref <5.0)
Triglycerides: 158 mg/dL — ABNORMAL HIGH (ref <150)

## 2019-09-12 LAB — HIV-1 RNA QUANT-NO REFLEX-BLD
HIV 1 RNA Quant: 78 Copies/mL — ABNORMAL HIGH
HIV-1 RNA Quant, Log: 1.89 Log cps/mL — ABNORMAL HIGH

## 2019-09-12 LAB — FLUORESCENT TREPONEMAL AB(FTA)-IGG-BLD: Fluorescent Treponemal ABS: REACTIVE — AB

## 2019-09-12 LAB — RPR: RPR Ser Ql: REACTIVE — AB

## 2019-09-12 LAB — RPR TITER: RPR Titer: 1:2 {titer} — ABNORMAL HIGH

## 2019-09-23 ENCOUNTER — Ambulatory Visit (INDEPENDENT_AMBULATORY_CARE_PROVIDER_SITE_OTHER): Payer: Medicare Other | Admitting: Infectious Disease

## 2019-09-23 ENCOUNTER — Other Ambulatory Visit: Payer: Self-pay

## 2019-09-23 ENCOUNTER — Ambulatory Visit: Payer: Self-pay

## 2019-09-23 VITALS — BP 151/91 | HR 69 | Temp 97.8°F | Wt 248.0 lb

## 2019-09-23 DIAGNOSIS — Q8901 Asplenia (congenital): Secondary | ICD-10-CM | POA: Diagnosis not present

## 2019-09-23 DIAGNOSIS — A528 Late syphilis, latent: Secondary | ICD-10-CM | POA: Diagnosis not present

## 2019-09-23 DIAGNOSIS — Z23 Encounter for immunization: Secondary | ICD-10-CM | POA: Diagnosis not present

## 2019-09-23 DIAGNOSIS — B20 Human immunodeficiency virus [HIV] disease: Secondary | ICD-10-CM | POA: Diagnosis not present

## 2019-09-23 DIAGNOSIS — E785 Hyperlipidemia, unspecified: Secondary | ICD-10-CM | POA: Diagnosis not present

## 2019-09-23 MED ORDER — DARUNAVIR ETHANOLATE 800 MG PO TABS: ORAL_TABLET | ORAL | 11 refills | Status: DC

## 2019-09-23 MED ORDER — GENVOYA 150-150-200-10 MG PO TABS: ORAL_TABLET | ORAL | 11 refills | Status: DC

## 2019-09-23 NOTE — Progress Notes (Signed)
   [facility]ination [facility]  Name:  Adam Becker    MRN: [id number] DOB: [birth year]  09/23/2019  [name] was observed post Covid-19 immunization for 15 minutes without incident. He was provided with Vaccine Information Sheet and instruction to access the V-Safe system.   [name] was instructed to call 911 with any severe reactions post vaccine: Marland Kitchen Difficulty breathing  . Swelling of face and throat  . A fast heartbeat  . A bad rash all over body  . Dizziness and weakness     Carlean Purl, RN

## 2019-09-23 NOTE — Progress Notes (Signed)
Subjective:  Chief complaint: followup for HIV disease   Patient ID: Adam Becker, male    DOB: 1955-11-24, 64 y.o.   MRN: [id number]  HPI  64  year old man with HIV currently well controlled on Genvoya with Prezista.  [name] had compiled prior R data as seen below  HIV Genotype Composite Data Genotype Dates:   Mutations in [place] impact drug susceptibility RT Mutations G190EQ, M41L, D67N, K70R, L74V, V75A, M184V, T215Y, K219Q  PI Mutations D30N, N88D  Integrase Mutations    Interpretation of Genotype Data per [place] HIV Database Nucleoside RTIs  abacavir (ABC) High-Level Resistance zidovudine (AZT)  High-Level Resistance stavudine (D4T)  High-Level Resistance didanosine (DDI)   High-Level Resistance emtricitabine (FTC)  High-Level Resistance lamivudine (3TC)  High-Level Resistance tenofovir (TDF)  Intermediate Resistance   Non-Nucleoside RTIs  efavirenz (EFV)  High-Level Resistance etravirine (ETR)  Intermediate Resistance nevirapine (NVP) High-Level Resistance rilpivirine (RPV) High-Level Resistance   Protease Inhibitors  atazanavir/r (ATV/r)           Potential Low-Level Resistance darunavir/r (DRV/r)           Susceptible fosamprenavir/r (FPV/r)           Susceptible indinavir/r (IDV/r) Susceptible lopinavir/r (LPV/r)            Susceptible saquinavir/r (SQV/r)           Potential Low-Level Resistance tipranavir/r (TPV/r)           Susceptible   Integrase Inhibitors  None   He was maintained nice virological suppression on Genvoya and Prezista.  We again discussed switch to [place] or consideration of Biktarvy + BID [place] (I would go to latter if trying to avoid COBI      Past Medical History:  Diagnosis Date  . 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 Chronic kidney disease Nov 2013   admission for [facility]N cr 11, acidosis  . HIV infection ([place])   . Hyperglycemia 04/04/2016  . Hyperlipidemia   . Hypertension   . Impaired fasting glucose  07/27/2015  . Urethritis 02/11/2015    Past Surgical History:  Procedure Laterality Date  . COLONOSCOPY    . LIPOSUCTION HEAD / NECK  2001   posterior neck  . SPLENECTOMY  2000   reason unclear "stopped working"    Family History  Problem Relation Age of Onset  . Cancer Mother   . Breast cancer Mother   . Colon cancer Father   . Birth defects Son        Breast       Social History   Socioeconomic History  . Marital status: Single    Spouse name: Not on file  . Number of children: Not on file  . Years of education: Not on file  . Highest education level: Not on file  Occupational History  . Not on file  Tobacco Use  . Smoking status: Never Smoker  . Smokeless tobacco: Never Used  Vaping Use  . Vaping Use: Never used  Substance and Sexual Activity  . Alcohol use: No  . Drug use: No  . Sexual activity: Not Currently    Comment: declined condoms  Other Topics Concern  . Not on file  Social History Narrative   Independent baseline. Lives by himself.  Education 12th.  Children none.  Caffeine tea one cup 4 x week.   Social Determinants of Health   Financial Resource Strain:   . Difficulty of Paying Living Expenses: Not on file  Food  Insecurity: No Food Insecurity  . Worried About Charity fundraiser in the Last Year: Never true  . Ran Out of Food in the Last Year: Never true  Transportation Needs: No Transportation Needs  . Lack of Transportation (Medical): No  . Lack of Transportation (Non-Medical): No  Physical Activity:   . Days of Exercise per Week: Not on file  . Minutes of Exercise per Session: Not on file  Stress: No Stress Concern Present  . Feeling of Stress : Not at all  Social Connections: Unknown  . Frequency of Communication with Friends and Family: More than three times a week  . Frequency of Social Gatherings with Friends and Family: More than three times a week  . Attends Religious Services: More than 4 times per year  . Active Member of Clubs  or Organizations: Yes  . Attends Archivist Meetings: More than 4 times per year  . Marital Status: Not on file    No Known Allergies   Current Outpatient Medications:  .  allopurinol (ZYLOPRIM) 100 MG tablet, TAKE 1 TABLET BY MOUTH EVERY DAY, Disp: 90 tablet, Rfl: 1 .  amLODipine (NORVASC) 5 MG tablet, TAKE 1 TABLET (5 MG TOTAL) BY MOUTH DAILY., Disp: 30 tablet, Rfl: 3 .  aspirin EC 81 MG tablet, Take 1 tablet (81 mg total) by mouth daily., Disp: 90 tablet, Rfl: 3 .  celecoxib (CELEBREX) 200 MG capsule, TAKE 1 CAPSULE BY MOUTH EVERY DAY, Disp: 30 capsule, Rfl: 1 .  darunavir (PREZISTA) 800 MG tablet, TAKE 1 TABLET (800 MG TOTAL) BY MOUTH DAILY., Disp: 30 tablet, Rfl: 11 .  elvitegravir-cobicistat-emtricitabine-tenofovir (GENVOYA) 150-150-200-10 MG TABS tablet, TAKE 1 TABLET BY MOUTH EVERY DAY WITH BREAKFAST, Disp: 30 tablet, Rfl: 11 .  gabapentin (NEURONTIN) 300 MG capsule, Take 2 capsules (600 mg total) by mouth 3 (three) times daily., Disp: 180 capsule, Rfl: 5 .  pravastatin (PRAVACHOL) 40 MG tablet, TAKE 1 TABLET BY MOUTH EVERY DAY, Disp: 90 tablet, Rfl: 3 .  predniSONE (STERAPRED UNI-PAK 21 TAB) 10 MG (21) TBPK tablet, Per packaging instructions, Disp: 21 tablet, Rfl: 0 .  telmisartan (MICARDIS) 40 MG tablet, TAKE 1 TABLET BY MOUTH EVERYDAY AT BEDTIME, Disp: 90 tablet, Rfl: 1 .  Vitamin D, Ergocalciferol, (DRISDOL) 1.25 MG (50000 UNIT) CAPS capsule, Take 50,000 Units by mouth once a week., Disp: , Rfl:    Review of Systems  Constitutional: Negative for activity change, appetite change, chills, diaphoresis, fever and unexpected weight change.  HENT: Negative for congestion, rhinorrhea, sinus pressure, sneezing, sore throat and trouble swallowing.   Eyes: Negative for photophobia and visual disturbance.  Respiratory: Negative for cough, chest tightness, shortness of breath, wheezing and stridor.   Cardiovascular: Negative for chest pain, palpitations and leg swelling.   Gastrointestinal: Negative for abdominal distention, abdominal pain, anal bleeding, blood in stool, constipation, diarrhea, nausea and vomiting.  Genitourinary: Negative for difficulty urinating, dysuria, flank pain and hematuria.  Musculoskeletal: Negative for arthralgias, back pain, gait problem, joint swelling and myalgias.  Skin: Negative for color change, pallor, rash and wound.  Neurological: Negative for dizziness, tremors, weakness and light-headedness.  Hematological: Negative for adenopathy. Does not bruise/bleed easily.  Psychiatric/Behavioral: Negative for agitation, behavioral problems, confusion, decreased concentration, dysphoric mood and sleep disturbance. The patient is not nervous/anxious.        Objective:   Physical Exam Constitutional:      General: He is not in acute distress.    Appearance: He is well-developed. He is not  diaphoretic.  HENT:     Head: Normocephalic and atraumatic.     Mouth/Throat:     Pharynx: No oropharyngeal exudate.  Eyes:     General: No scleral icterus.    Conjunctiva/sclera: Conjunctivae normal.     Pupils: Pupils are equal, round, and reactive to light.  Neck:     Vascular: No JVD.  Cardiovascular:     Rate and Rhythm: Normal rate and regular rhythm.     Heart sounds: Normal heart sounds.  Pulmonary:     Effort: Pulmonary effort is normal. No respiratory distress.     Breath sounds: No wheezing.  Abdominal:     General: There is no distension.  Musculoskeletal:     Cervical back: Normal range of motion and neck supple.  Skin:    General: Skin is warm and dry.     Findings: No erythema or rash.  Neurological:     Mental Status: He is alert and oriented to person, place, and time.     Coordination: Coordination normal.  Psychiatric:        Mood and Affect: Mood normal.        Behavior: Behavior normal.        Thought Content: Thought content normal.        Judgment: Judgment normal.           Assessment & Plan:   HIV: continue Genvoya and Prezista, see above      HTN: followed with Dr. Derrel Nip  There were no vitals filed for this visit.   Hyperlipidemia: at goal  Lipid Panel     Component Value Date/Time   CHOL 183 09/09/2019 0917   TRIG 158 (H) 09/09/2019 0917   HDL 37 (L) 09/09/2019 0917   CHOLHDL 4.9 09/09/2019 0917   VLDL 27.0 05/29/2019 0908   LDLCALC 119 (H) 09/09/2019 0917   LDLDIRECT 99 07/26/2015 1700     Syphilis: titers low NCS  DM managed by Dr.[name]  Asplenia:  Gave pneumovax today, Pfizer number 3. I gave him rx for menactra since we dont carry this vaccine here.

## 2019-10-02 ENCOUNTER — Telehealth: Payer: Self-pay | Admitting: Internal Medicine

## 2019-10-02 NOTE — Telephone Encounter (Signed)
Pt states that his neurologist bumped his gabapentin (NEURONTIN) 300 MG capsule to twice a day. Please call with any questions.

## 2019-10-02 NOTE — Telephone Encounter (Signed)
FYI

## 2019-10-08 ENCOUNTER — Other Ambulatory Visit: Payer: Self-pay | Admitting: Internal Medicine

## 2019-10-09 ENCOUNTER — Telehealth: Payer: Self-pay

## 2019-10-09 NOTE — Telephone Encounter (Signed)
Patient called office today requesting CMA to call pharmacy to clarify prescription for menactra. Advised patient to have pharmacy call office if they have questions. If possible asked patient to take prescription to [facility] outpatient pharmacy.  Adam Becker

## 2019-10-13 NOTE — Telephone Encounter (Signed)
Spoke with [facility] and patient has received the [facility] vaccine with [facility] [place] on 10/09/19 [place]

## 2019-10-15 ENCOUNTER — Other Ambulatory Visit: Payer: Self-pay | Admitting: Internal Medicine

## 2019-10-25 ENCOUNTER — Other Ambulatory Visit: Payer: Self-pay | Admitting: Infectious Disease

## 2019-10-25 DIAGNOSIS — E785 Hyperlipidemia, unspecified: Secondary | ICD-10-CM

## 2019-10-27 DIAGNOSIS — Z23 Encounter for immunization: Secondary | ICD-10-CM | POA: Diagnosis not present

## 2019-10-28 DIAGNOSIS — G608 Other hereditary and idiopathic neuropathies: Secondary | ICD-10-CM | POA: Diagnosis not present

## 2019-10-28 DIAGNOSIS — E569 Vitamin deficiency, unspecified: Secondary | ICD-10-CM | POA: Diagnosis not present

## 2019-10-28 DIAGNOSIS — G5603 Carpal tunnel syndrome, bilateral upper limbs: Secondary | ICD-10-CM | POA: Diagnosis not present

## 2019-10-28 DIAGNOSIS — E538 Deficiency of other specified B group vitamins: Secondary | ICD-10-CM | POA: Diagnosis not present

## 2019-11-30 ENCOUNTER — Other Ambulatory Visit: Payer: Self-pay | Admitting: Internal Medicine

## 2019-12-09 ENCOUNTER — Other Ambulatory Visit: Payer: Self-pay

## 2019-12-09 ENCOUNTER — Telehealth: Payer: Self-pay | Admitting: Internal Medicine

## 2019-12-09 ENCOUNTER — Ambulatory Visit (INDEPENDENT_AMBULATORY_CARE_PROVIDER_SITE_OTHER): Payer: Medicare Other | Admitting: Nurse Practitioner

## 2019-12-09 ENCOUNTER — Encounter: Payer: Self-pay | Admitting: Nurse Practitioner

## 2019-12-09 VITALS — BP 135/87 | HR 84 | Resp 17 | Ht 71.0 in | Wt 236.0 lb

## 2019-12-09 DIAGNOSIS — Z20822 Contact with and (suspected) exposure to covid-19: Secondary | ICD-10-CM | POA: Diagnosis not present

## 2019-12-09 DIAGNOSIS — J069 Acute upper respiratory infection, unspecified: Secondary | ICD-10-CM | POA: Diagnosis not present

## 2019-12-09 MED ORDER — TRIAMCINOLONE ACETONIDE 55 MCG/ACT NA AERO: 2.0000 | INHALATION_SPRAY | Freq: Every day | NASAL | 0 refills | Status: DC | PRN

## 2019-12-09 MED ORDER — GUAIFENESIN ER 600 MG PO TB12: 1200.0000 mg | ORAL_TABLET | Freq: Two times a day (BID) | ORAL | 0 refills | Status: AC | PRN

## 2019-12-09 NOTE — Telephone Encounter (Signed)
Patient called in wanted a antibiotic called in for an sinus infection

## 2019-12-09 NOTE — Telephone Encounter (Signed)
Spoke with pt and he has been scheduled for a telephone visit with Earline Mayotte today at 1:30pm.

## 2019-12-09 NOTE — Patient Instructions (Addendum)
Please get the virus tests for flu, RSV, Covid  NSS nasal spray SIMPLY SALINE- no medicine in I to help with decongestion  Nasacort for short course- no more than 7 days and only if needed and if it helps  Mucinex to thin secretions   Robitussin if you get a cough  Advised patient on supportive measures:  Get rest, drink plenty of fluids, and use tylenol or ibuprofen as needed for pain.   Follow up if fever >101, if symptoms worsen or if symptoms are not improved in 3 days. Patient verbalizes understanding.    Upper Respiratory Infection, Adult An upper respiratory infection (URI) is a common viral infection of the nose, throat, and upper air passages that lead to the lungs. The most common type of URI is the common cold. URIs usually get better on their own, without medical treatment. What are the causes? A URI is caused by a virus. You may catch a virus by:  Breathing in droplets from an infected person's cough or sneeze.  Touching something that has been exposed to the virus (contaminated) and then touching your mouth, nose, or eyes. What increases the risk? You are more likely to get a URI if:  You are very young or very old.  It is autumn or winter.  You have close contact with others, such as at a daycare, school, or health care facility.  You smoke.  You have long-term (chronic) heart or lung disease.  You have a weakened disease-fighting (immune) system.  You have nasal allergies or asthma.  You are experiencing a lot of stress.  You work in an area that has poor air circulation.  You have poor nutrition. What are the signs or symptoms? A URI usually involves some of the following symptoms:  Runny or stuffy (congested) nose.  Sneezing.  Cough.  Sore throat.  Headache.  Fatigue.  Fever.  Loss of appetite.  Pain in your forehead, behind your eyes, and over your cheekbones (sinus pain).  Muscle aches.  Redness or irritation of the  eyes.  Pressure in the ears or face. How is this diagnosed? This condition may be diagnosed based on your medical history and symptoms, and a physical exam. Your health care provider may use a cotton swab to take a mucus sample from your nose (nasal swab). This sample can be tested to determine what virus is causing the illness. How is this treated? URIs usually get better on their own within 7-10 days. You can take steps at home to relieve your symptoms. Medicines cannot cure URIs, but your health care provider may recommend certain medicines to help relieve symptoms, such as:  Over-the-counter cold medicines.  Cough suppressants. Coughing is a type of defense against infection that helps to clear the respiratory system, so take these medicines only as recommended by your health care provider.  Fever-reducing medicines. Follow these instructions at home: Activity  Rest as needed.  If you have a fever, stay home from work or school until your fever is gone or until your health care provider says you are no longer contagious. Your health care provider may have you wear a face mask to prevent your infection from spreading. Relieving symptoms  Gargle with a salt-water mixture 3-4 times a day or as needed. To make a salt-water mixture, completely dissolve -1 tsp of salt in 1 cup of warm water.  Use a cool-mist humidifier to add moisture to the air. This can help you breathe more easily. Eating and  drinking   Drink enough fluid to keep your urine pale yellow.  Eat soups and other clear broths. General instructions   Take over-the-counter and prescription medicines only as told by your health care provider. These include cold medicines, fever reducers, and cough suppressants.  Do not use any products that contain nicotine or tobacco, such as cigarettes and e-cigarettes. If you need help quitting, ask your health care provider.  Stay away from secondhand smoke.  Stay up to date on  all immunizations, including the yearly (annual) flu vaccine.  Keep all follow-up visits as told by your health care provider. This is important. How to prevent the spread of infection to others   URIs can be passed from person to person (are contagious). To prevent the infection from spreading: ? Wash your hands often with soap and water. If soap and water are not available, use hand sanitizer. ? Avoid touching your mouth, face, eyes, or nose. ? Cough or sneeze into a tissue or your sleeve or elbow instead of into your hand or into the air. Contact a health care provider if:  You are getting worse instead of better.  You have a fever or chills.  Your mucus is brown or red.  You have yellow or brown discharge coming from your nose.  You have pain in your face, especially when you bend forward.  You have swollen neck glands.  You have pain while swallowing.  You have white areas in the back of your throat. Get help right away if:  You have shortness of breath that gets worse.  You have severe or persistent: ? Headache. ? Ear pain. ? Sinus pain. ? Chest pain.  You have chronic lung disease along with any of the following: ? Wheezing. ? Prolonged cough. ? Coughing up blood. ? A change in your usual mucus.  You have a stiff neck.  You have changes in your: ? Vision. ? Hearing. ? Thinking. ? Mood. Summary  An upper respiratory infection (URI) is a common infection of the nose, throat, and upper air passages that lead to the lungs.  A URI is caused by a virus.  URIs usually get better on their own within 7-10 days.  Medicines cannot cure URIs, but your health care provider may recommend certain medicines to help relieve symptoms. This information is not intended to replace advice given to you by your health care provider. Make sure you discuss any questions you have with your health care provider. Document Revised: 01/17/2018 Document Reviewed:  08/25/2016 Elsevier Patient Education  [place].

## 2019-12-09 NOTE — Progress Notes (Addendum)
Virtual Visit via telephone  Note  This visit type was conducted due to national recommendations for restrictions regarding the COVID-19 pandemic (e.g. social distancing).  This format is felt to be most appropriate for this patient at this time.  All issues noted in this document were discussed and addressed.  No physical exam was performed (except for noted visual exam findings with Video Visits).   I connected with@ on 12/10/19 at  1:30 PM EST by a video enabled telemedicine application or telephone and verified that I am speaking with the correct person using two identifiers. Location patient: home Location provider: work or home office Persons participating in the virtual visit: patient, provider  I discussed the limitations, risks, security and privacy concerns of performing an evaluation and management service by telephone and the availability of in person appointments. I also discussed with the patient that there may be a patient responsible charge related to this service. The patient expressed understanding and agreed to proceed.  Interactive audio and video telecommunications were attempted between this provider and patient, however failed, due to patient did not have access to video capability.  We continued and completed visit with audio only.   Reason for visit: cold symptoms started on Saturday  HPI: This 64 yo with history of HIV, CCK, HTN, DM, HTN, reports onset Saturday of stuffy head, minor HA, and he had yellow nasal discharge and blood came out once when he blew his nose. He took Engineer, production and Flu but stopped it d/T BP elevation.He feels better today- not as congested and HA is better than 2 days ago. No fever/chills/body aches/fatigue. He has a little cough . No SOB or wheezing.   ROS: See pertinent positives and negatives per HPI.  Past Medical History:  Diagnosis Date  . 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 Chronic kidney disease Nov 2013   admission for ATN cr 11, acidosis   . HIV infection ([place])   . Hyperglycemia 04/04/2016  . Hyperlipidemia   . Hypertension   . Impaired fasting glucose 07/27/2015  . Urethritis 02/11/2015    Past Surgical History:  Procedure Laterality Date  . COLONOSCOPY    . LIPOSUCTION HEAD / NECK  2001   posterior neck  . SPLENECTOMY  2000   reason unclear "stopped working"    Family History  Problem Relation Age of Onset  . Cancer Mother   . Breast cancer Mother   . Colon cancer Father   . Birth defects Son        Breast     SOCIAL HX: Neg tobacco   Current Outpatient Medications:  .  allopurinol (ZYLOPRIM) 100 MG tablet, TAKE 1 TABLET BY MOUTH EVERY DAY, Disp: 90 tablet, Rfl: 1 .  amLODipine (NORVASC) 5 MG tablet, TAKE 1 TABLET (5 MG TOTAL) BY MOUTH DAILY., Disp: 30 tablet, Rfl: 3 .  aspirin EC 81 MG tablet, Take 1 tablet (81 mg total) by mouth daily., Disp: 90 tablet, Rfl: 3 .  celecoxib (CELEBREX) 200 MG capsule, TAKE 1 CAPSULE BY MOUTH EVERY DAY, Disp: 30 capsule, Rfl: 1 .  darunavir (PREZISTA) 800 MG tablet, TAKE 1 TABLET (800 MG TOTAL) BY MOUTH DAILY., Disp: 30 tablet, Rfl: 11 .  elvitegravir-cobicistat-emtricitabine-tenofovir (GENVOYA) 150-150-200-10 MG TABS tablet, TAKE 1 TABLET BY MOUTH EVERY DAY WITH BREAKFAST, Disp: 30 tablet, Rfl: 11 .  gabapentin (NEURONTIN) 300 MG capsule, Take 2 capsules (600 mg total) by mouth 3 (three) times daily., Disp: 180 capsule, Rfl: 5 .  pravastatin (PRAVACHOL) 40 MG  tablet, TAKE 1 TABLET BY MOUTH EVERY DAY, Disp: 90 tablet, Rfl: 1 .  telmisartan (MICARDIS) 40 MG tablet, TAKE 1 TABLET BY MOUTH EVERYDAY AT BEDTIME, Disp: 90 tablet, Rfl: 1 .  vitamin B-12 (CYANOCOBALAMIN) 1000 MCG tablet, Take 1,000 mcg by mouth daily., Disp: , Rfl:  .  guaiFENesin (MUCINEX) 600 MG 12 hr tablet, Take 2 tablets (1,200 mg total) by mouth 2 (two) times daily as needed for up to 7 days for cough or to loosen phlegm., Disp: 28 tablet, Rfl: 0 .  triamcinolone (NASACORT) 55 MCG/ACT AERO nasal inhaler, Place 2  sprays into the nose daily as needed for up to 7 days., Disp: 14 each, Rfl: 0  EXAM:  VITALS per patient if applicable:135/87-82. No pulse oximeter.   GENERAL: Sounds alert, oriented, with good energy and in no acute distress  LUNGS: No sounds of respiratory distress, no coughing, no obvious gross SOB, gasping or wheezing  PSYCH/NEURO: pleasant and cooperative, no obvious depression or anxiety, speech and thought processing grossly intact  ASSESSMENT AND PLAN:  Discussed the following assessment and plan:  URI with cough and congestion - Plan: COVID-19, Flu A+B and RSV, COVID-19, Flu A+B and RSV  Suspected COVID-19 virus infection - Plan: COVID-19, Flu A+B and RSV, COVID-19, Flu A+B and RSV  No problem-specific Assessment & Plan notes found for this encounter. Please get the virus tests for flu, RSV, Covid  NSS nasal spray SIMPLY SALINE- no medicine in I to help with decongestion  Nasacort for short course- no more than 7 days and only if needed and if it helps  Mucinex to thin secretions   Robitussin if you get a cough  Advised patient on supportive measures:  Get rest, drink plenty of fluids, and use tylenol or ibuprofen as needed for pain.   Follow up if fever >101, if symptoms worsen or if symptoms are not improved in 3 days. Patient verbalizes understanding.     I discussed the assessment and treatment plan with the patient. The patient was provided an opportunity to ask questions and all were answered. The patient agreed with the plan and demonstrated an understanding of the instructions.   The patient was advised to call back or seek an in-person evaluation if the symptoms worsen or if the condition fails to improve as anticipated.  Telephone time spent 8 minnutes.  Denice Paradise, NP Adult Nurse Practitioner [place] [phone]

## 2019-12-10 NOTE — Telephone Encounter (Signed)
Patient called in wanted to know his covid result

## 2019-12-10 NOTE — Telephone Encounter (Signed)
Spoke with pt to let him know that the results have not came back yet and let him know that as soon as they do we will give him a call. Pt gave a verbal understanding.

## 2019-12-11 ENCOUNTER — Telehealth: Payer: Self-pay | Admitting: Nurse Practitioner

## 2019-12-11 LAB — COVID-19, FLU A+B AND RSV
Influenza A, NAA: NOT DETECTED
Influenza B, NAA: NOT DETECTED
RSV, NAA: NOT DETECTED
SARS-CoV-2, NAA: NOT DETECTED

## 2019-12-11 MED ORDER — DOXYCYCLINE HYCLATE 100 MG PO TABS: 100.0000 mg | ORAL_TABLET | Freq: Two times a day (BID) | ORAL | 0 refills | Status: DC

## 2019-12-11 NOTE — Telephone Encounter (Signed)
We can not hand off paperwork to a patient at the back door. Called patient and asked if results need to be faxed somewhere or I can mail them to him. He wants them mailed. I have printed results and put in mail for him.

## 2019-12-11 NOTE — Telephone Encounter (Signed)
He is feeling better but still has nasal congestion and slight yellow discharge with scant blood occasionally when he blew his nose. He thinks the NSS and Nasacort is helping. No fever/chills/body aches and very little cough. He feels better than 2 days ago, but his nasal congestion is not gone. 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 He does not have My Chart.   PLAN: Continue supportive care with normal saline, Mucinex, and caution with Nasacort as this may irritate the lining of the nose and contribute to nasal bleeding.  He was advised to stop it if he has nose bleeding. We discussed antibiotic use.  He will pick up the Doxy antibiotic and Florastor (explained role to help prevent thrush and diarrhea)  and begin these only if he has persistent symptoms.  He does not think he needs to take an antibiotic at this time.  Follow-up no improvement in 3 days after he begins antibiotic.   Jessica: He needs a copy of his negative Covid test. He cannot pick it up in the office and wants to know if Latoya can hand it to him via drive through. If yes, what  time will he need to come to the back  today?

## 2019-12-15 NOTE — Addendum Note (Signed)
Addended by: Denice Paradise A on: 12/15/2019 01:19 PM   Modules accepted: Level of Service

## 2019-12-18 ENCOUNTER — Other Ambulatory Visit: Payer: Self-pay | Admitting: Internal Medicine

## 2020-01-16 ENCOUNTER — Other Ambulatory Visit: Payer: Self-pay | Admitting: Internal Medicine

## 2020-01-29 ENCOUNTER — Encounter: Payer: Self-pay | Admitting: Family Medicine

## 2020-01-29 ENCOUNTER — Ambulatory Visit (INDEPENDENT_AMBULATORY_CARE_PROVIDER_SITE_OTHER): Payer: Medicare Other | Admitting: Family Medicine

## 2020-01-29 ENCOUNTER — Other Ambulatory Visit: Payer: Self-pay

## 2020-01-29 ENCOUNTER — Other Ambulatory Visit: Payer: Self-pay | Admitting: Infectious Disease

## 2020-01-29 VITALS — BP 130/70 | HR 80 | Temp 98.3°F | Ht 71.0 in | Wt 248.2 lb

## 2020-01-29 DIAGNOSIS — G629 Polyneuropathy, unspecified: Secondary | ICD-10-CM

## 2020-01-29 DIAGNOSIS — R8290 Unspecified abnormal findings in urine: Secondary | ICD-10-CM

## 2020-01-29 DIAGNOSIS — R1031 Right lower quadrant pain: Secondary | ICD-10-CM | POA: Diagnosis not present

## 2020-01-29 DIAGNOSIS — E785 Hyperlipidemia, unspecified: Secondary | ICD-10-CM

## 2020-01-29 LAB — BASIC METABOLIC PANEL
BUN: 16 mg/dL (ref 6–23)
CO2: 31 mEq/L (ref 19–32)
Calcium: 9.5 mg/dL (ref 8.4–10.5)
Chloride: 99 mEq/L (ref 96–112)
Creatinine, Ser: 1.38 mg/dL (ref 0.40–1.50)
GFR: 54.49 mL/min — ABNORMAL LOW (ref >60.00)
Glucose, Bld: 166 mg/dL — ABNORMAL HIGH (ref 70–99)
Potassium: 4.7 mEq/L (ref 3.5–5.1)
Sodium: 135 mEq/L (ref 135–145)

## 2020-01-29 LAB — CBC WITH DIFFERENTIAL/PLATELET
Basophils Absolute: 0 10*3/uL (ref 0.0–0.1)
Basophils Relative: 0.9 % (ref 0.0–3.0)
Eosinophils Absolute: 0.3 10*3/uL (ref 0.0–0.7)
Eosinophils Relative: 5.6 % — ABNORMAL HIGH (ref 0.0–5.0)
HCT: 41 % (ref 39.0–52.0)
Hemoglobin: 14 g/dL (ref 13.0–17.0)
Lymphocytes Relative: 53 % — ABNORMAL HIGH (ref 12.0–46.0)
Lymphs Abs: 3 10*3/uL (ref 0.7–4.0)
MCHC: 34.3 g/dL (ref 30.0–36.0)
MCV: 97.1 fl (ref 78.0–100.0)
Monocytes Absolute: 0.7 10*3/uL (ref 0.1–1.0)
Monocytes Relative: 11.8 % (ref 3.0–12.0)
Neutro Abs: 1.6 10*3/uL (ref 1.4–7.7)
Neutrophils Relative %: 28.7 % — ABNORMAL LOW (ref 43.0–77.0)
Platelets: 344 10*3/uL (ref 150.0–400.0)
RBC: 4.22 Mil/uL (ref 4.22–5.81)
RDW: 13.6 % (ref 11.5–15.5)
WBC: 5.6 10*3/uL (ref 4.0–10.5)

## 2020-01-29 LAB — URINALYSIS, MICROSCOPIC ONLY
RBC / HPF: NONE SEEN (ref 0–?)
WBC, UA: NONE SEEN (ref 0–?)

## 2020-01-29 LAB — POCT URINALYSIS DIPSTICK
Bilirubin, UA: NEGATIVE
Blood, UA: NEGATIVE
Glucose, UA: NEGATIVE
Protein, UA: NEGATIVE
Spec Grav, UA: 1.015 (ref 1.010–1.025)
Urobilinogen, UA: >=8 E.U./dL — AB
pH, UA: 6 (ref 5.0–8.0)

## 2020-01-29 MED ORDER — GABAPENTIN 300 MG PO CAPS: 600.0000 mg | ORAL_CAPSULE | Freq: Three times a day (TID) | ORAL | 5 refills | Status: DC

## 2020-01-29 NOTE — Progress Notes (Signed)
Tommi Rumps, MD Phone: [phone]  Adam Becker is a 65 y.o. male who presents today for same day visit.  Right groin pain: Patient notes onset of this last night.  He went to urinate and felt pain radiate from the base of his penis up to his right groin and right lower quadrant.  He had some urinary frequency last night though he did drink more water than usual after he had this episode.  He denies urgency and dysuria. No blood in his urine.  No penile discharge.  No history of kidney stones.  He does have his appendix.  He notes no significant pain currently though does occasionally note a pinch in his right lower quadrant.  Social History   Tobacco Use  Smoking Status Never Smoker  Smokeless Tobacco Never Used    Current Outpatient Medications on File Prior to Visit  Medication Sig Dispense Refill  . allopurinol (ZYLOPRIM) 100 MG tablet TAKE 1 TABLET BY MOUTH EVERY DAY 90 tablet 1  . amLODipine (NORVASC) 5 MG tablet TAKE 1 TABLET (5 MG TOTAL) BY MOUTH DAILY. 30 tablet 3  . aspirin EC 81 MG tablet Take 1 tablet (81 mg total) by mouth daily. 90 tablet 3  . celecoxib (CELEBREX) 200 MG capsule TAKE 1 CAPSULE BY MOUTH EVERY DAY 30 capsule 1  . darunavir (PREZISTA) 800 MG tablet TAKE 1 TABLET (800 MG TOTAL) BY MOUTH DAILY. 30 tablet 11  . doxycycline (VIBRA-TABS) 100 MG tablet Take 1 tablet (100 mg total) by mouth 2 (two) times daily. 14 tablet 0  . elvitegravir-cobicistat-emtricitabine-tenofovir (GENVOYA) 150-150-200-10 MG TABS tablet TAKE 1 TABLET BY MOUTH EVERY DAY WITH BREAKFAST 30 tablet 11  . pravastatin (PRAVACHOL) 40 MG tablet TAKE 1 TABLET BY MOUTH EVERY DAY 90 tablet 1  . telmisartan (MICARDIS) 40 MG tablet TAKE 1 TABLET BY MOUTH EVERYDAY AT BEDTIME 90 tablet 1  . vitamin B-12 (CYANOCOBALAMIN) 1000 MCG tablet Take 1,000 mcg by mouth daily.     No current facility-administered medications on file prior to visit.     ROS see history of present  illness  Objective  Physical Exam Vitals:   01/29/20 1058  BP: 130/70  Pulse: 80  Temp: 98.3 F (36.8 C)  SpO2: 99%    BP Readings from Last 3 Encounters:  01/29/20 130/70  12/09/19 135/87  09/23/19 (!) 151/91   Wt Readings from Last 3 Encounters:  01/29/20 248 lb 3.2 oz (112.6 kg)  12/09/19 236 lb (107 kg)  09/23/19 248 lb (112.5 kg)    Physical Exam Abdominal:     General: Bowel sounds are normal. There is no distension.     Palpations: Abdomen is soft.     Tenderness: There is no abdominal tenderness. There is no guarding or rebound.  Genitourinary:    Comments: Normal uncircumcised penis, no discharge noted, no penile lesions noted, normal scrotum, normal testicles, no inguinal hernias bilaterally, no palpable abnormalities or tenderness in his right inguinal area     Assessment/Plan: Please see individual problem list.  Problem List Items Addressed This Visit    RLQ abdominal pain    Pain is more so in patient's right lower quadrant and right inguinal area.  He has a benign exam today and symptoms have improved.  Differential includes kidney stone versus UTI versus hernia versus intra-abdominal process.  Urinalysis is concerning for possible UTI though this is generally less likely in a male.  We will send his urine for culture and micro prior to  treating given atypical presentation.  We will check renal function and CBC.  If no obvious cause found on labs would consider imaging to evaluate further.  Discussed that I would contact the patient with his culture results.  Advised to contact us if his symptoms worsened again.      Relevant Orders   POCT Urinalysis Dipstick   CBC w/Diff   Basic Metabolic Panel (BMET)    Other Visit Diagnoses    Abnormal urinalysis    -  Primary   Relevant Orders   Urine Culture   Urine Microscopic        This visit occurred during the SARS-CoV-2 public health emergency.  Safety protocols were in place, including screening  questions prior to the visit, additional usage of staff PPE, and extensive cleaning of exam room while observing appropriate contact time as indicated for disinfecting solutions.    Marikay Alar, MD [facility]

## 2020-01-29 NOTE — Assessment & Plan Note (Addendum)
Pain is more so in patient's right lower quadrant and right inguinal area.  He has a benign exam today and symptoms have improved.  Differential includes kidney stone versus UTI versus hernia versus intra-abdominal process.  Urinalysis is concerning for possible UTI though this is generally less likely in a male.  We will send his urine for culture and micro prior to treating given atypical presentation.  We will check renal function and CBC.  If no obvious cause found on labs would consider imaging to evaluate further.  Discussed that I would contact the patient with his culture results.  Advised to contact us if his symptoms worsened again.

## 2020-01-29 NOTE — Patient Instructions (Signed)
Nice to see you. We will get a get lab work and contact you with the results. You are sending your urine for culture to see if there is an infection. If your symptoms return or worsen please let us know right away.  If you develop severe abdominal pain or blood in your urine please be evaluated in the emergency department.

## 2020-01-30 LAB — URINE CULTURE
MICRO NUMBER:: 11389956
Result:: NO GROWTH
SPECIMEN QUALITY:: ADEQUATE

## 2020-02-01 ENCOUNTER — Other Ambulatory Visit: Payer: Self-pay | Admitting: Family Medicine

## 2020-02-01 DIAGNOSIS — R8290 Unspecified abnormal findings in urine: Secondary | ICD-10-CM

## 2020-02-05 ENCOUNTER — Other Ambulatory Visit: Payer: Self-pay

## 2020-02-05 ENCOUNTER — Other Ambulatory Visit (INDEPENDENT_AMBULATORY_CARE_PROVIDER_SITE_OTHER): Payer: Medicare Other

## 2020-02-05 DIAGNOSIS — R8290 Unspecified abnormal findings in urine: Secondary | ICD-10-CM | POA: Diagnosis not present

## 2020-02-05 LAB — HEPATIC FUNCTION PANEL
ALT: 37 U/L (ref 0–53)
AST: 29 U/L (ref 0–37)
Albumin: 4.4 g/dL (ref 3.5–5.2)
Alkaline Phosphatase: 52 U/L (ref 39–117)
Bilirubin, Direct: 0.1 mg/dL (ref 0.0–0.3)
Total Bilirubin: 0.4 mg/dL (ref 0.2–1.2)
Total Protein: 7.9 g/dL (ref 6.0–8.3)

## 2020-02-06 ENCOUNTER — Telehealth: Payer: Self-pay | Admitting: Internal Medicine

## 2020-02-06 NOTE — Telephone Encounter (Signed)
Patient saw a little blood in stool today, it was red. Please advise.

## 2020-02-06 NOTE — Telephone Encounter (Signed)
Patient stated he had blood on toilet tissue  no blood on stool, no hx of hemorrhoids, no large BM, no black tarry stool, no straining for BM. Patient didn't feel he needed an appointment but he wanted to let PCP know. Also instructed patients to call back  if sx become worse and gave information on stool softeners.

## 2020-02-20 ENCOUNTER — Other Ambulatory Visit: Payer: Self-pay | Admitting: Internal Medicine

## 2020-04-22 ENCOUNTER — Other Ambulatory Visit: Payer: Self-pay | Admitting: Internal Medicine

## 2020-04-22 ENCOUNTER — Other Ambulatory Visit: Payer: Self-pay | Admitting: Infectious Disease

## 2020-04-22 DIAGNOSIS — E785 Hyperlipidemia, unspecified: Secondary | ICD-10-CM

## 2020-04-22 DIAGNOSIS — G629 Polyneuropathy, unspecified: Secondary | ICD-10-CM

## 2020-04-29 ENCOUNTER — Ambulatory Visit (INDEPENDENT_AMBULATORY_CARE_PROVIDER_SITE_OTHER): Payer: Medicare Other | Admitting: Internal Medicine

## 2020-04-29 ENCOUNTER — Other Ambulatory Visit: Payer: Self-pay

## 2020-04-29 ENCOUNTER — Encounter: Payer: Self-pay | Admitting: Internal Medicine

## 2020-04-29 VITALS — BP 132/86 | HR 72 | Temp 98.1°F | Resp 15 | Ht 71.0 in | Wt 230.2 lb

## 2020-04-29 DIAGNOSIS — Z79899 Other long term (current) drug therapy: Secondary | ICD-10-CM | POA: Diagnosis not present

## 2020-04-29 DIAGNOSIS — M6281 Muscle weakness (generalized): Secondary | ICD-10-CM | POA: Diagnosis not present

## 2020-04-29 DIAGNOSIS — E1165 Type 2 diabetes mellitus with hyperglycemia: Secondary | ICD-10-CM | POA: Diagnosis not present

## 2020-04-29 DIAGNOSIS — E7800 Pure hypercholesterolemia, unspecified: Secondary | ICD-10-CM | POA: Diagnosis not present

## 2020-04-29 DIAGNOSIS — E669 Obesity, unspecified: Secondary | ICD-10-CM

## 2020-04-29 DIAGNOSIS — R809 Proteinuria, unspecified: Secondary | ICD-10-CM

## 2020-04-29 DIAGNOSIS — E1129 Type 2 diabetes mellitus with other diabetic kidney complication: Secondary | ICD-10-CM | POA: Diagnosis not present

## 2020-04-29 DIAGNOSIS — Z125 Encounter for screening for malignant neoplasm of prostate: Secondary | ICD-10-CM

## 2020-04-29 DIAGNOSIS — E559 Vitamin D deficiency, unspecified: Secondary | ICD-10-CM | POA: Diagnosis not present

## 2020-04-29 DIAGNOSIS — G608 Other hereditary and idiopathic neuropathies: Secondary | ICD-10-CM | POA: Diagnosis not present

## 2020-04-29 DIAGNOSIS — I10 Essential (primary) hypertension: Secondary | ICD-10-CM

## 2020-04-29 DIAGNOSIS — G5603 Carpal tunnel syndrome, bilateral upper limbs: Secondary | ICD-10-CM | POA: Diagnosis not present

## 2020-04-29 DIAGNOSIS — E538 Deficiency of other specified B group vitamins: Secondary | ICD-10-CM | POA: Diagnosis not present

## 2020-04-29 LAB — CK: Total CK: 767 U/L — ABNORMAL HIGH (ref 7–232)

## 2020-04-29 LAB — LDL CHOLESTEROL, DIRECT: Direct LDL: 89 mg/dL

## 2020-04-29 LAB — COMPREHENSIVE METABOLIC PANEL
ALT: 38 U/L (ref 0–53)
AST: 28 U/L (ref 0–37)
Albumin: 4.5 g/dL (ref 3.5–5.2)
Alkaline Phosphatase: 69 U/L (ref 39–117)
BUN: 22 mg/dL (ref 6–23)
CO2: 28 mEq/L (ref 19–32)
Calcium: 10.1 mg/dL (ref 8.4–10.5)
Chloride: 97 mEq/L (ref 96–112)
Creatinine, Ser: 1.39 mg/dL (ref 0.40–1.50)
GFR: 53.93 mL/min — ABNORMAL LOW (ref >60.00)
Glucose, Bld: 494 mg/dL — ABNORMAL HIGH (ref 70–99)
Potassium: 4.5 mEq/L (ref 3.5–5.1)
Sodium: 134 mEq/L — ABNORMAL LOW (ref 135–145)
Total Bilirubin: 0.5 mg/dL (ref 0.2–1.2)
Total Protein: 8.3 g/dL (ref 6.0–8.3)

## 2020-04-29 LAB — LIPID PANEL
Cholesterol: 226 mg/dL — ABNORMAL HIGH (ref 0–200)
HDL: 38.1 mg/dL — ABNORMAL LOW (ref >39.00)
Total CHOL/HDL Ratio: 6
Triglycerides: 497 mg/dL — ABNORMAL HIGH (ref 0.0–149.0)

## 2020-04-29 LAB — MICROALBUMIN / CREATININE URINE RATIO
Creatinine,U: 74 mg/dL
Microalb Creat Ratio: 31.7 mg/g — ABNORMAL HIGH (ref 0.0–30.0)
Microalb, Ur: 23.4 mg/dL — ABNORMAL HIGH (ref 0.0–1.9)

## 2020-04-29 LAB — HEMOGLOBIN A1C: Hgb A1c MFr Bld: 12.8 % — ABNORMAL HIGH (ref 4.6–6.5)

## 2020-04-29 LAB — PSA, MEDICARE: PSA: 0.45 ng/ml (ref 0.10–4.00)

## 2020-04-29 LAB — TSH: TSH: 1.06 u[IU]/mL (ref 0.35–4.50)

## 2020-04-29 NOTE — Progress Notes (Signed)
Subjective:  Patient ID: Adam Becker, male    DOB: [birth year]  Age: 65 y.o. MRN: [id number]  CC: The primary encounter diagnosis was HYPERTENSION, BENIGN ESSENTIAL. 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  HPI [place] Becker presents for follow up on hypertension, but has not been seen for diabetes follow up in a year , and his last CPE was 2020 .  [name] is a 65 yr old male who has HIV managed by ID with Genvoya and Prestiza ,  Now followed annually    Hypertension: patient checks blood pressure occasionally at home on a 64 yr old machine.  Readings are quite variable .  Last Sunday his BP was 149/107  On a friend's machine during a headache.  rechecked it after taking tylenol and resting for 30 minutes,  and it was lower.  Taking celebrex once daily  Not sure why,  Being treated for  Neuropathy  with gabapentin 300 mg tid   .  Has occasional vertigo but not daily   For the last several weeks has been having muscle cramps in lower legs  Occurring nocturnally   Urinary frequency for the last 3 weeks . Hs been drinking more water lately.  Denies hesitancy,  dysuria, and incomplete voids.     Had an episode  of blood in stool and rectal pain in early January. Did not schedule a visit.   Overdue for eye exam colonoscopy and TDap  History of shingles that affected him on the right flank ,  Still feels a " drawing"   In that area  He has been losing weight 13 lbs since last year.     Outpatient Medications Prior to Visit  Medication Sig Dispense Refill  . allopurinol (ZYLOPRIM) 100 MG tablet TAKE 1 TABLET BY MOUTH EVERY DAY 90 tablet 1  . amLODipine (NORVASC) 5 MG tablet TAKE 1 TABLET (5 MG TOTAL) BY MOUTH DAILY. 30 tablet 3  . aspirin EC 81 MG tablet Take 1 tablet  (81 mg total) by mouth daily. 90 tablet 3  . celecoxib (CELEBREX) 200 MG capsule TAKE 1 CAPSULE BY MOUTH EVERY DAY 30 capsule 1  . darunavir (PREZISTA) 800 MG tablet TAKE 1 TABLET (800 MG TOTAL) BY MOUTH DAILY. 30 tablet 11  . elvitegravir-cobicistat-emtricitabine-tenofovir (GENVOYA) 150-150-200-10 MG TABS tablet TAKE 1 TABLET BY MOUTH EVERY DAY WITH BREAKFAST 30 tablet 11  . gabapentin (NEURONTIN) 300 MG capsule TAKE 1 CAPSULE BY MOUTH THREE TIMES A DAY 90 capsule 2  . pravastatin (PRAVACHOL) 40 MG tablet TAKE 1 TABLET BY MOUTH EVERY DAY 30 tablet 3  . rOPINIRole (REQUIP) 0.25 MG tablet Take 0.75 mg by mouth 3 (three) times daily.    Marland Kitchen telmisartan (MICARDIS) 40 MG tablet TAKE 1 TABLET BY MOUTH EVERYDAY AT BEDTIME 90 tablet 1  . vitamin B-12 (CYANOCOBALAMIN) 1000 MCG tablet Take 1,000 mcg by mouth daily.    Marland Kitchen doxycycline (VIBRA-TABS) 100 MG tablet Take 1 tablet (100 mg total) by mouth 2 (two) times daily. (Patient not taking: Reported on 04/29/2020) 14 tablet 0   No facility-administered medications prior to visit.    Review of Systems;  Patient denies headache, fevers, malaise, unintentional weight loss, skin rash, eye pain, sinus congestion and sinus pain, sore throat, dysphagia,  hemoptysis , cough, dyspnea, wheezing, chest pain, palpitations, orthopnea, edema, abdominal pain, nausea,  melena, diarrhea, constipation, flank pain, dysuria, hematuria, urinary  Frequency, nocturia, numbness, tingling, seizures,  Focal weakness, Loss of consciousness,  Tremor, insomnia, depression, anxiety, and suicidal ideation.      Objective:  BP 132/86 (BP Location: Left Arm, Patient Position: Sitting, Cuff Size: Large)   Pulse 72   Temp 98.1 F (36.7 C) (Oral)   Resp 15   Ht 5\' 11"  (1.803 m)   Wt 230 lb 3.2 oz (104.4 kg)   SpO2 97%   BMI 32.11 kg/m   BP Readings from Last 3 Encounters:  04/29/20 132/86  01/29/20 130/70  12/09/19 135/87    Wt Readings from Last 3 Encounters:  04/29/20 230 lb 3.2  oz (104.4 kg)  01/29/20 248 lb 3.2 oz (112.6 kg)  12/09/19 236 lb (107 kg)    General appearance: alert, cooperative and appears stated age Ears: normal TM's and external ear canals both ears Throat: lips, mucosa, and tongue normal; teeth and gums normal Neck: no adenopathy, no carotid bruit, supple, symmetrical, trachea midline and thyroid not enlarged, symmetric, no tenderness/mass/nodules Back: symmetric, no curvature. ROM normal. No CVA tenderness. Lungs: clear to auscultation bilaterally Heart: regular rate and rhythm, S1, S2 normal, no murmur, click, rub or gallop Abdomen: soft, non-tender; bowel sounds normal; no masses,  no organomegaly Pulses: 2+ and symmetric Skin: Skin color, texture, turgor normal. No rashes or lesions Lymph nodes: Cervical, supraclavicular, and axillary nodes normal.  Lab Results  Component Value Date   HGBA1C 12.8 (H) 04/29/2020   HGBA1C 6.4 05/29/2019   HGBA1C 6.7 (H) 03/11/2019    Lab Results  Component Value Date   CREATININE 1.39 04/29/2020   CREATININE 1.38 01/29/2020   CREATININE 1.33 (H) 09/09/2019    Lab Results  Component Value Date   WBC 5.6 01/29/2020   HGB 14.0 01/29/2020   HCT 41.0 01/29/2020   PLT 344.0 01/29/2020   GLUCOSE 494 (H) 04/29/2020   CHOL 226 (H) 04/29/2020   TRIG (H) 04/29/2020    497.0 Triglyceride is over 400; calculations on Lipids are invalid.   HDL 38.10 (L) 04/29/2020   LDLDIRECT 89.0 04/29/2020   LDLCALC 119 (H) 09/09/2019   ALT 38 04/29/2020   AST 28 04/29/2020   NA 134 (L) 04/29/2020   K 4.5 04/29/2020   CL 97 04/29/2020   CREATININE 1.39 04/29/2020   BUN 22 04/29/2020   CO2 28 04/29/2020   TSH 1.06 04/29/2020   PSA 0.45 04/29/2020   HGBA1C 12.8 (H) 04/29/2020   MICROALBUR 23.4 (H) 04/29/2020    No results found.  Assessment & Plan:   Problem List Items Addressed This Visit      Unprioritized   Hyperlipidemia    Managed with pravastatin.  Triglycerides are elevated due to uncontrolled  diabetes.       Relevant Orders   Lipid panel (Completed)   HYPERTENSION, BENIGN ESSENTIAL - Primary   Obesity (BMI 30.0-34.9)    He has had an unintentional weight loss of 13 lbs in the last year. This appears to be due to uncontrolled diabetes based on today's labs. Will have patient return to discuss diabetes management.    Lab Results  Component Value Date   TSH 1.06 04/29/2020   Lab Results  Component Value Date   WBC 5.6 01/29/2020   HGB 14.0 01/29/2020   HCT 41.0 01/29/2020   MCV 97.1 01/29/2020   PLT 344.0 01/29/2020   Lab Results  Component Value Date   HGBA1C 12.8 (H) 04/29/2020  Prostate cancer screening   Relevant Orders   PSA, Medicare (Completed)   Uncontrolled diabetes mellitus ([place])     Now uncontrolled due to loss of follow up for a year. Patient has microalbuminuria. Patient is tolerating statin therapy for CAD risk reduction, low dose aspirin, and  ACE inhibitor for  renal protection and hypertension .  He will return for management and instruction .  Lab Results  Component Value Date   HGBA1C 12.8 (H) 04/29/2020   Lab Results  Component Value Date   MICROALBUR 23.4 (H) 04/29/2020         Relevant Orders   Hemoglobin A1c (Completed)   Comprehensive metabolic panel (Completed)   Microalbumin / creatinine urine ratio (Completed)    Other Visit Diagnoses    Muscle weakness       Relevant Orders   TSH (Completed)   CK (Creatine Kinase) (Completed)     A total of 40 minutes was spent with patient more than half of which was spent in counseling patient on the above mentioned issues , reviewing and explaining recent labs and imaging studies done, and coordination of care.  I have discontinued Marjory Lies. Fujiwara's doxycycline. I am also having him maintain his amLODipine, aspirin EC, darunavir, Genvoya, vitamin B-12, allopurinol, telmisartan, celecoxib, gabapentin, pravastatin, and rOPINIRole.  No orders of the defined types were placed  in this encounter.   Medications Discontinued During This Encounter  Medication Reason  . doxycycline (VIBRA-TABS) 100 MG tablet     Follow-up: Return in about 4 weeks (around 05/27/2020).   [name], MD

## 2020-04-29 NOTE — Patient Instructions (Signed)
We have a lot of "catching up to do" since you haven't been seen in a year.  I will make the colonoscopy referral   The tetanus and shingles vaccines are NOT covered by Medicare so I have given you a prescription for then  Since your blood pressure has been elevated,  Increase the amlodipine  to 10 mg daily and return in one month for follow up

## 2020-05-01 ENCOUNTER — Other Ambulatory Visit: Payer: Self-pay | Admitting: Internal Medicine

## 2020-05-02 MED ORDER — BASAGLAR KWIKPEN 100 UNIT/ML ~~LOC~~ SOPN: 15.0000 [IU] | PEN_INJECTOR | Freq: Every day | SUBCUTANEOUS | 1 refills | Status: DC

## 2020-05-02 MED ORDER — METFORMIN HCL 500 MG PO TABS: 500.0000 mg | ORAL_TABLET | Freq: Two times a day (BID) | ORAL | 3 refills | Status: DC

## 2020-05-02 NOTE — Assessment & Plan Note (Signed)
Managed with pravastatin.  Triglycerides are elevated due to uncontrolled diabetes.

## 2020-05-02 NOTE — Progress Notes (Signed)
Your labs suggest that the involuntary weight loss you have had is due to uncontrolled diabetes;  your A1c is 12.8 and your blood sugar was nearly 500.!  I  would like you to return ASAP for several visits:  1) an RN  visit to learn how to check your blood sugar,  and give yourself a once daily dose of insulin  called Lantus starting with 15 units daily .  2) I am also starting you on metformin 500 mg twice daily and want you to follow up with me in 3 weeks,  3) I would like to refer you to our our clinical pharmacist,  Dr Courtney Heys,  to help manage your  medications . Please let me know that you agree to this plan and I will make the referral

## 2020-05-02 NOTE — Assessment & Plan Note (Signed)
Now uncontrolled due to loss of follow up for a year. Patient has microalbuminuria. Patient is tolerating statin therapy for CAD risk reduction, low dose aspirin, and  ACE inhibitor for  renal protection and hypertension .  He will return for management and instruction .  Lab Results  Component Value Date   HGBA1C 12.8 (H) 04/29/2020   Lab Results  Component Value Date   MICROALBUR 23.4 (H) 04/29/2020

## 2020-05-02 NOTE — Assessment & Plan Note (Signed)
He has had an unintentional weight loss of 13 lbs in the last year. This appears to be due to uncontrolled diabetes based on today's labs. Will have patient return to discuss diabetes management.    Lab Results  Component Value Date   TSH 1.06 04/29/2020   Lab Results  Component Value Date   WBC 5.6 01/29/2020   HGB 14.0 01/29/2020   HCT 41.0 01/29/2020   MCV 97.1 01/29/2020   PLT 344.0 01/29/2020   Lab Results  Component Value Date   HGBA1C 12.8 (H) 04/29/2020

## 2020-05-06 ENCOUNTER — Ambulatory Visit: Payer: Medicare Other

## 2020-05-06 ENCOUNTER — Other Ambulatory Visit: Payer: Self-pay

## 2020-05-06 VITALS — BP 135/79 | HR 78

## 2020-05-06 DIAGNOSIS — I10 Essential (primary) hypertension: Secondary | ICD-10-CM

## 2020-05-06 NOTE — Progress Notes (Signed)
Patient is here for a BP check due to bp being high at last visit, as per patient.  Currently patients BP is 135/79 and BPM is 78. Patients machine read BP as 138/84 and the BPM was 79. Patient has no complaints of headaches, blurry vision, chest pain, arm pain, light headedness, dizziness, and nor jaw pain. Please see previous note for order. Patient also did not need teaching for the glucometer and pt stated he is not going to do insulin.

## 2020-05-07 ENCOUNTER — Other Ambulatory Visit: Payer: Self-pay | Admitting: Internal Medicine

## 2020-05-07 DIAGNOSIS — E1165 Type 2 diabetes mellitus with hyperglycemia: Secondary | ICD-10-CM

## 2020-05-10 ENCOUNTER — Other Ambulatory Visit: Payer: Self-pay | Admitting: Internal Medicine

## 2020-05-10 ENCOUNTER — Telehealth: Payer: Self-pay | Admitting: Internal Medicine

## 2020-05-10 MED ORDER — COMFORT EZ PEN NEEDLES 32G X 5 MM MISC: 1.0000 | Freq: Three times a day (TID) | 1 refills | Status: DC

## 2020-05-10 MED ORDER — GLUCOSE BLOOD VI STRP: ORAL_STRIP | 12 refills | Status: DC

## 2020-05-10 NOTE — Telephone Encounter (Signed)
Pt needs refills on his test strips and needled sent to [facility]

## 2020-05-10 NOTE — Addendum Note (Signed)
Addended byElpidio Galea T on: 05/10/2020 09:25 AM   Modules accepted: Orders

## 2020-05-11 ENCOUNTER — Telehealth: Payer: Self-pay

## 2020-05-11 NOTE — Chronic Care Management (AMB) (Signed)
  Chronic Care Management   Note  05/11/2020 Name: Adam Becker MRN: [id number] DOB: [birth year]  Adam Becker is a 65 y.o. year old male who is a primary care patient of Adam Becker, [name], MD. I reached out to Adam Becker by phone today in response to a referral sent by Adam Becker PCP, [name], MD     [name] was given information about Chronic Care Management services today including:  1. CCM service includes personalized support from designated clinical staff supervised by his physician, including individualized plan of care and coordination with other care providers 2. 24/7 contact phone numbers for assistance for urgent and routine care needs. 3. Service will only be billed when office clinical staff spend 20 minutes or more in a month to coordinate care. 4. Only one practitioner may furnish and bill the service in a calendar month. 5. The patient may stop CCM services at any time (effective at the end of the month) by phone call to the office staff. 6. The patient will be responsible for cost sharing (co-pay) of up to 20% of the service fee (after annual deductible is met).  Patient agreed to services and verbal consent obtained.   Follow up plan: Telephone appointment with care management team member scheduled for:05/13/2020  Noreene Larsson, [place], [place], [place] [address] Direct Dial: [phone] [name]: [email or web address]

## 2020-05-13 ENCOUNTER — Ambulatory Visit (INDEPENDENT_AMBULATORY_CARE_PROVIDER_SITE_OTHER): Payer: Medicare Other | Admitting: Pharmacist

## 2020-05-13 ENCOUNTER — Other Ambulatory Visit: Payer: Self-pay | Admitting: Internal Medicine

## 2020-05-13 DIAGNOSIS — I10 Essential (primary) hypertension: Secondary | ICD-10-CM | POA: Diagnosis not present

## 2020-05-13 DIAGNOSIS — E1165 Type 2 diabetes mellitus with hyperglycemia: Secondary | ICD-10-CM

## 2020-05-13 DIAGNOSIS — E7800 Pure hypercholesterolemia, unspecified: Secondary | ICD-10-CM

## 2020-05-13 DIAGNOSIS — N182 Chronic kidney disease, stage 2 (mild): Secondary | ICD-10-CM | POA: Diagnosis not present

## 2020-05-13 DIAGNOSIS — R748 Abnormal levels of other serum enzymes: Secondary | ICD-10-CM

## 2020-05-13 MED ORDER — EMPAGLIFLOZIN 10 MG PO TABS: 10.0000 mg | ORAL_TABLET | Freq: Every day | ORAL | 0 refills | Status: DC

## 2020-05-13 MED ORDER — METFORMIN HCL 500 MG PO TABS: 1000.0000 mg | ORAL_TABLET | Freq: Two times a day (BID) | ORAL | 3 refills | Status: DC

## 2020-05-13 MED ORDER — BLOOD GLUCOSE MONITOR KIT: PACK | 11 refills | Status: DC

## 2020-05-13 NOTE — Patient Instructions (Addendum)
It was great to talk with you today!  Our goal A1c is less than 7%. This corresponds with fasting sugars less than 130 and 2 hour after meal sugars <180. Please check blood sugars twice daily - fasting and 2 hours after your largest meal.   Increase metformin. Increase to 1 tablet in the morning, 2 tablet in the evening for 1 week, then increase to 2 tablets twice daily. You can take this with food on your stomach if you start to have any stomach upset problems.   Given the improvement already in your glucose readings and the improvement in your symptoms of high glucose, I think we can hold off on insulin at this time if you are truly not interested in an injectable.  We talked about a weekly injectable called Ozempic vs a daily pill called Jardiance. You preferred to start Jardiance. Take Jardiance 10 mg, 1 tablet daily in the morning.   Between now and when you see Dr. Derrel Nip, please HOLD OFF on taking pravastatin.   Call me with any questions or concerns!  Catie Darnelle Maffucci, PharmD [phone]  Visit Information   PATIENT GOALS:  Goals Addressed              This Visit's Progress     Patient Stated   .  Medication Management (pt-stated)        Patient Goals/Self-Care Activities . Over the next 90 days, patient will:  - take medications as prescribed check glucose twice daily, document, and provide at future appointments engage in dietary modifications by reducing carbohydrate portion sizes        Consent to CCM Services: Mr. Lannen was given information about Chronic Care Management services today including:  1. CCM service includes personalized support from designated clinical staff supervised by his physician, including individualized plan of care and coordination with other care providers 2. 24/7 contact phone numbers for assistance for urgent and routine care needs. 3. Service will only be billed when office clinical staff spend 20 minutes or more in a month to  coordinate care. 4. Only one practitioner may furnish and bill the service in a calendar month. 5. The patient may stop CCM services at any time (effective at the end of the month) by phone call to the office staff. 6. The patient will be responsible for cost sharing (co-pay) of up to 20% of the service fee (after annual deductible is met).  Patient agreed to services and verbal consent obtained.   Print copy of patient instructions, educational materials, and care plan provided in person.  Plan: Telephone follow up appointment with care management team member scheduled for:  ~ 6 weeks  Catie Darnelle Maffucci, PharmD, [place], CPP Clinical Pharmacist [place] at [facility] [place]: Patient Care Plan: Medication Management    Problem Identified: Diabetes, HTN, HLD     Long-Range Goal: Disease Progression Prevention   Start Date: 05/13/2020  This Visit's Progress: On track  Priority: High  Note:   Current Barriers:  . Unable to achieve control of diabetes   Pharmacist Clinical Goal(s):  Marland Kitchen Over the next 90 days, patient will achieve control of diabetes as evidenced by A1c through collaboration with PharmD and provider.   Interventions: . 1:1 collaboration with [name], MD regarding development and update of comprehensive plan of care as evidenced by provider attestation and co-signature . Inter-disciplinary care team collaboration (see longitudinal plan of care) . Comprehensive medication review performed; medication list updated  in electronic medical record  Diabetes: . Uncontrolled; current treatment: metformin XR 500 mg BID, prescribed Lantus 10 units daily but patient adamantly refuses injections today . Notes some diarrhea when first starting metformin, but this improved over time. Resolved now. . Current glucose readings: fasting glucose: 200s (previously 400s), post prandial glucose: not checking . Confirms improvement and  resolution in nocturia, polydipsia . Current meal patterns: breakfast: sausage and egg biscuit w/ hashbrowns; lunch: varies, depends on if he's hungry. Baked chicken, sometimes fried, fish, salad, tries to focus on vegetables (green beans, broccoli, okra, pintos, potatoes); dinner: if he eats a later lunch, he'll eat a smaller supper; snacks: sometimes sweet things, graham crackers, fruits; drinks: water, stopped drinking sodas years ago, stopped using salt  . Current exercise: tries to walk on treadmill in the morning, has incorporated weights every other day . Extensive education regarding goal A1c, goal fasting, goal 2 hour post prandial glucose.  . Educated on macro and microvascular complications of diabetes.  . Discussed maximizing metformin therapy. Patient amenable. Increase to 500 mg QAM, 1000 mg QPM x 1 weeks then increase to 1000 mg BID. Counseled to take with food if persistent GI upset. Will switch to XR metformin if persistent GI upset despite taking with food.  . Patient not interested in insulin therapy given improvement in hyperglycemic symptoms and improvement in blood sugars with metformin monotherapy. Discussed that a second agent is likely needed at this time. Discussed Ozempic vs Jardiance, including mechanism of action, side effects, monitoring. Patient amenable to weight loss, but not interested in injectable at this time. Discussed Rybelsus, but I have not seen the same efficacy with Rybelsus as with Ozempic. Discussed renal benefits of Jardiance. Patient amenable. Start Jardiance 10 mg daily. Provided with 4 weeks of samples.  . Encouraged to check glucose BID - fasting and 2 hours after meals  Hypertension: . Uncontrolled at last office visit; current treatment: telmisartan 40 mg daily . Current home readings: not checking at home . Continue current regimen at this time  Hyperlipidemia: . Controlled; current treatment: pravastatin 40 mg daily  . Antiplatelet regimen:  aspirin 81 mg dialy  . Reported muscle aches to PCP on last appointment. Notes that there wasn't "pain", but some aches that seemed to occur with peripheral neuropathy "shooting pain". PCP checked CK, was elevated.  . Discussed w/ patient and provider today. Hold pravastatin, labwork per PCP in ~3-4 weeks. PCP visit follow up in ~3 weeks.   HIV: . Managed by ID; current regimen: Prezista 800 mg daily, Genvoya 150/150/200/10 mg  . Continue current regimen at this time  Peripheral Neuropathy; . Moderately well managed; current regimen: gabapenin 300 mg TID, though reports he is taking 600 mg TID; ropinirole 0.25 mg TID, appears neurology was going to increase to XR 4 mg daily, but patient notes he has not received the order yet. Follows w/ neurology Dr. Manuella Ghazi . Ferritin at goal.  . Encouraged him to communicate w/ neurology. Continue current regimen at this time  Gout: . Controlled; current regimen: allopurinol 100 mg daily . Last uric acid at goal . Continue current regimen at this time  Patient Goals/Self-Care Activities . Over the next 90 days, patient will:  - take medications as prescribed check glucose twice daily, document, and provide at future appointments engage in dietary modifications by reducing carbohydrate portion sizes  Follow Up Plan: Telephone follow up appointment with care management team member scheduled for: ~ 6 weeks

## 2020-05-13 NOTE — Chronic Care Management (AMB) (Signed)
Chronic Care Management Pharmacy Note  05/13/2020 Name:  Adam Becker MRN:  [id number] DOB:  March 10, 1955  Subjective: Adam Becker is an 65 y.o. year old male who is a primary patient of [name], [name], MD.  The CCM team was consulted for assistance with disease management and care coordination needs.    Engaged with patient by telephone for initial visit in response to provider referral for pharmacy case management and/or care coordination services.   Consent to Services:  The patient was given the following information about Chronic Care Management services today, agreed to services, and gave verbal consent: 1. CCM service includes personalized support from designated clinical staff supervised by the primary care provider, including individualized plan of care and coordination with other care providers 2. 24/7 contact phone numbers for assistance for urgent and routine care needs. 3. Service will only be billed when office clinical staff spend 20 minutes or more in a month to coordinate care. 4. Only one practitioner may furnish and bill the service in a calendar month. 5.The patient may stop CCM services at any time (effective at the end of the month) by phone call to the office staff. 6. The patient will be responsible for cost sharing (co-pay) of up to 20% of the service fee (after annual deductible is met). Patient agreed to services and consent obtained.  Patient Care Team: [name], MD as PCP - General (Internal Medicine) [name], [name], MD as PCP - Infectious Diseases (Infectious Diseases) De Hollingshead, RPH-CPP (Pharmacist)  Recent office visits:  4/7- PCP - had not been seen for chronic needs in >1 year; A1c 12, started on metformin + insulin, BP elevated; UACR elevated  4/14 - BP check - noted that patient was not going to take insulin  Recent consult visits:  4/7 - neurology - increased ropinirole for peripheral neuropathy (hx HIV, DM, hx  asplenia) - rec to take Vit D 50,000 weekly x 8 weeks then 1000 units daily, ferritin high, B12 at goal  Hospital visits: None in previous 6 months  Objective:  Lab Results  Component Value Date   CREATININE 1.39 04/29/2020   CREATININE 1.38 01/29/2020   CREATININE 1.33 (H) 09/09/2019    Lab Results  Component Value Date   HGBA1C 12.8 (H) 04/29/2020   Last diabetic Eye exam:  Lab Results  Component Value Date/Time   HMDIABEYEEXA No Retinopathy 10/22/2018 12:00 AM    Last diabetic Foot exam: No results found for: HMDIABFOOTEX      Component Value Date/Time   CHOL 226 (H) 04/29/2020 0854   TRIG (H) 04/29/2020 0854    497.0 Triglyceride is over 400; calculations on Lipids are invalid.   HDL 38.10 (L) 04/29/2020 0854   CHOLHDL 6 04/29/2020 0854   VLDL 27.0 05/29/2019 0908   LDLCALC 119 (H) 09/09/2019 0917   LDLDIRECT 89.0 04/29/2020 0854    Hepatic Function Latest Ref Rng & Units 04/29/2020 02/05/2020 09/09/2019  Total Protein 6.0 - 8.3 g/dL 8.3 7.9 7.5  Albumin 3.5 - 5.2 g/dL 4.5 4.4 -  AST 0 - 37 U/L 28 29 32  ALT 0 - 53 U/L 38 37 33  Alk Phosphatase 39 - 117 U/L 69 52 -  Total Bilirubin 0.2 - 1.2 mg/dL 0.5 0.4 0.4  Bilirubin, Direct 0.0 - 0.3 mg/dL - 0.1 -    Lab Results  Component Value Date/Time   TSH 1.06 04/29/2020 08:54 AM   TSH 1.43 03/11/2019 08:47 AM  FREET4 0.74 01/10/2018 12:05 PM    CBC Latest Ref Rng & Units 01/29/2020 09/09/2019 02/27/2019  WBC 4.0 - 10.5 K/uL 5.6 5.4 6.0  Hemoglobin 13.0 - 17.0 g/dL 14.0 13.5 14.6  Hematocrit 39.0 - 52.0 % 41.0 40.4 41.4  Platelets 150.0 - 400.0 K/uL 344.0 365 319    Clinical ASCVD: Yes - hx TIA The 10-year ASCVD risk score Mikey Bussing DC Jr., et al., 2013) is: 32.1%   Values used to calculate the score:     Age: 45 years     Sex: Male     Is Non-Hispanic African American: Yes     Diabetic: Yes     Tobacco smoker: No     Systolic Blood Pressure: 038 mmHg     Is BP treated: Yes     HDL Cholesterol: 38.1 mg/dL      Total Cholesterol: 226 mg/dL     Social History   Tobacco Use  Smoking Status Never Smoker  Smokeless Tobacco Never Used   BP Readings from Last 3 Encounters:  05/06/20 135/79  04/29/20 132/86  01/29/20 130/70   Pulse Readings from Last 3 Encounters:  05/06/20 78  04/29/20 72  01/29/20 80   Wt Readings from Last 3 Encounters:  04/29/20 230 lb 3.2 oz (104.4 kg)  01/29/20 248 lb 3.2 oz (112.6 kg)  12/09/19 236 lb (107 kg)    Assessment: Review of patient past medical history, allergies, medications, health status, including review of consultants reports, laboratory and other test data, was performed as part of comprehensive evaluation and provision of chronic care management services.   SDOH:  (Social Determinants of Health) assessments and interventions performed:  SDOH Interventions   Flowsheet Row Most Recent Value  SDOH Interventions   Financial Strain Interventions Intervention Not Indicated      CCM Care Plan  No Known Allergies  Medications Reviewed Today    Reviewed by Adair Laundry, [place] (Certified Medical Assistant) on 04/29/20 at [id number]  Med List Status: <None>  Medication Order Taking? Sig Documenting Provider Last Dose Status Informant  allopurinol (ZYLOPRIM) 100 MG tablet [id number] Yes TAKE 1 TABLET BY MOUTH EVERY DAY [name], MD Taking Active   amLODipine (NORVASC) 5 MG tablet [id number] Yes TAKE 1 TABLET (5 MG TOTAL) BY MOUTH DAILY. [name], MD Taking Active   aspirin EC 81 MG tablet [id number] Yes Take 1 tablet (81 mg total) by mouth daily. [name], FNP Taking Active   celecoxib (CELEBREX) 200 MG capsule [id number] Yes TAKE 1 CAPSULE BY MOUTH EVERY DAY [name], MD Taking Active   darunavir (PREZISTA) 800 MG tablet [id number] Yes TAKE 1 TABLET (800 MG TOTAL) BY MOUTH DAILY. [name], [name], MD Taking Active   elvitegravir-cobicistat-emtricitabine-tenofovir (GENVOYA) 150-150-200-10 MG TABS tablet [id number] Yes TAKE 1 TABLET  BY MOUTH EVERY DAY WITH BREAKFAST [name], [name], MD Taking Active   gabapentin (NEURONTIN) 300 MG capsule [id number] Yes TAKE 1 CAPSULE BY MOUTH THREE TIMES A DAY [name], MD Taking Active   pravastatin (PRAVACHOL) 40 MG tablet [id number] Yes TAKE 1 TABLET BY MOUTH EVERY DAY [name], [name], MD Taking Active   rOPINIRole (REQUIP) 0.25 MG tablet [id number] Yes Take 0.75 mg by mouth 3 (three) times daily. [provider] Taking Active   telmisartan (MICARDIS) 40 MG tablet [id number] Yes TAKE 1 TABLET BY MOUTH EVERYDAY AT BEDTIME [name], MD Taking Active   vitamin B-12 (CYANOCOBALAMIN) 1000 MCG tablet [id number] Yes  Take 1,000 mcg by mouth daily. [provider] Taking Active           Patient Active Problem List   Diagnosis Date Noted  . RLQ abdominal pain 01/29/2020  . Suspected COVID-19 virus infection 12/09/2019  . 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 Fracture of phalanx of foot 06/24/2019  . Avulsion of toenail of right foot 06/16/2019  . Abdominal wall mass 03/11/2019  . Family history of colon cancer in father 08/10/2018  . Right flank pain 02/09/2018  . Neuropathy 01/12/2018  . 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 Uncontrolled diabetes mellitus ([place]) 01/11/2018  . Elevated liver function tests 11/27/2017  . Vertigo 09/11/2017  . TIA (transient ischemic attack) 08/18/2017  . Need for immunization against influenza 10/10/2016  . Prostate cancer screening 10/10/2016  . 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 Routine adult health maintenance 04/23/2014  . Allergic rhinitis due to allergen 04/23/2014  . 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 Gout attack 09/20/2012  . Obesity (BMI 30.0-34.9) 12/09/2011  . Hyperlipidemia 11/01/2009  . NEUROSYPHILIS 03/11/2009  . PARESTHESIA, HANDS 06/18/2008  . LATE SYPHILIS, LATENT 10/30/2007  . ERECTILE DYSFUNCTION, MILD 12/31/2006  .  HYPERTENSION, BENIGN ESSENTIAL 10/15/2006  . Human immunodeficiency virus (HIV) disease ([place]) 04/16/2006    Immunization History  Administered Date(s) Administered  . H1N1 01/02/2008  . Hepatitis A 12/31/2006, 07/15/2007  . Hepatitis B 03/26/2001, 05/06/2001, 10/14/2001  . Influenza Inj Mdck Quad Pf 10/12/2017  . Influenza Split 11/15/2010, 12/07/2011  . Influenza Whole 10/02/2005, 10/15/2006, 11/26/2008, 11/01/2009  . Influenza,inj,Quad PF,6+ Mos 10/07/2012, 11/05/2013, 11/11/2014, 10/12/2015, 10/10/2016, 10/24/2019  . Influenza-Unspecified 09/28/2018  . Meningococcal Conjugate 10/09/2019  . Meningococcal Polysaccharide 10/12/2015  . PFIZER(Purple Top)SARS-COV-2 Vaccination 03/28/2019, 04/18/2019, 09/23/2019  . Pneumococcal Conjugate-13 06/21/2011  . Pneumococcal Polysaccharide-23 09/23/2005, 10/02/2005, 02/29/2012, 09/23/2019    Conditions to be addressed/monitored: HTN, HLD, DMII and CKD, peripheral neuropathy  Care Plan : Medication Management  Updates made by De Hollingshead, RPH-CPP since 05/13/2020 12:00 AM    Problem: Diabetes, HTN, HLD     Long-Range Goal: Disease Progression Prevention   Start Date: 05/13/2020  This Visit's Progress: On track  Priority: High  Note:   Current Barriers:  . Unable to achieve control of diabetes   Pharmacist Clinical Goal(s):  Marland Kitchen Over the next 90 days, patient will achieve control of diabetes as evidenced by A1c through collaboration with PharmD and provider.   Interventions: . 1:1 collaboration with [name], MD regarding development and update of comprehensive plan of care as evidenced by provider attestation and co-signature . Inter-disciplinary care team collaboration (see longitudinal plan of care) . Comprehensive medication review performed; medication list updated in electronic medical record  Diabetes: . Uncontrolled; current treatment: metformin XR 500 mg BID, prescribed Lantus 10 units daily but patient adamantly  refuses injections today . Notes some diarrhea when first starting metformin, but this improved over time. Resolved now. . Current glucose readings: fasting glucose: 200s (previously 400s), post prandial glucose: not checking . Confirms improvement and resolution in nocturia, polydipsia . Current meal patterns: breakfast: sausage and egg biscuit w/ hashbrowns; lunch: varies, depends on if he's hungry. Baked chicken, sometimes fried, fish, salad, tries to focus on vegetables (green beans, broccoli, okra, pintos, potatoes); dinner: if he eats a later lunch, he'll eat a smaller supper; snacks: sometimes sweet things, graham crackers, fruits; drinks: water, stopped drinking sodas years ago, stopped using salt  . Current exercise: tries  to walk on treadmill in the morning, has incorporated weights every other day . Extensive education regarding goal A1c, goal fasting, goal 2 hour post prandial glucose.  . Educated on macro and microvascular complications of diabetes.  . Discussed maximizing metformin therapy. Patient amenable. Increase to 500 mg QAM, 1000 mg QPM x 1 weeks then increase to 1000 mg BID. Counseled to take with food if persistent GI upset. Will switch to XR metformin if persistent GI upset despite taking with food.  . Patient not interested in insulin therapy given improvement in hyperglycemic symptoms and improvement in blood sugars with metformin monotherapy. Discussed that a second agent is likely needed at this time. Discussed Ozempic vs Jardiance, including mechanism of action, side effects, monitoring. Patient amenable to weight loss, but not interested in injectable at this time. Discussed Rybelsus, but I have not seen the same efficacy with Rybelsus as with Ozempic. Discussed renal benefits of Jardiance. Patient amenable. Start Jardiance 10 mg daily. Provided with 4 weeks of samples.  . Encouraged to check glucose BID - fasting and 2 hours after meals  Hypertension: . Uncontrolled at  last office visit; current treatment: telmisartan 40 mg daily . Current home readings: not checking at home . Continue current regimen at this time  Hyperlipidemia: . Controlled; current treatment: pravastatin 40 mg daily  . Antiplatelet regimen: aspirin 81 mg dialy  . Reported muscle aches to PCP on last appointment. Notes that there wasn't "pain", but some aches that seemed to occur with peripheral neuropathy "shooting pain". PCP checked CK, was elevated.  . Discussed w/ patient and provider today. Hold pravastatin, labwork per PCP in ~3-4 weeks. PCP visit follow up in ~3 weeks.   HIV: . Managed by ID; current regimen: Prezista 800 mg daily, Genvoya 150/150/200/10 mg  . Continue current regimen at this time  Peripheral Neuropathy; . Moderately well managed; current regimen: gabapenin 300 mg TID, though reports he is taking 600 mg TID; ropinirole 0.25 mg TID, appears neurology was going to increase to XR 4 mg daily, but patient notes he has not received the order yet. Follows w/ neurology Dr. Manuella Ghazi . Ferritin at goal.  . Encouraged him to communicate w/ neurology. Continue current regimen at this time  Gout: . Controlled; current regimen: allopurinol 100 mg daily . Last uric acid at goal . Continue current regimen at this time  Patient Goals/Self-Care Activities . Over the next 90 days, patient will:  - take medications as prescribed check glucose twice daily, document, and provide at future appointments engage in dietary modifications by reducing carbohydrate portion sizes  Follow Up Plan: Telephone follow up appointment with care management team member scheduled for: ~ 6 weeks      Medication Assistance: None required.  Patient affirms current coverage meets needs.  Patient's [facility] pharmacy [facility]:  [id number] [id number]: [phone]: [phone]  Follow Up:  Patient agrees to Care Plan and  Follow-up.  Plan: Telephone follow up appointment with care management team member scheduled for:  ~ 6 weeks  Catie TDarnelle Maffucci PharmD, BBeemer CMillhousenClinical Pharmacist [facility]cc[phone]

## 2020-05-14 MED ORDER — GLUCOSE BLOOD VI STRP: ORAL_STRIP | 12 refills | Status: DC

## 2020-05-14 MED ORDER — ONETOUCH ULTRASOFT LANCETS MISC: 12 refills | Status: DC

## 2020-05-14 NOTE — Addendum Note (Signed)
Addended by: De Hollingshead on: 05/14/2020 03:20 PM   Modules accepted: Orders

## 2020-05-20 DIAGNOSIS — H259 Unspecified age-related cataract: Secondary | ICD-10-CM | POA: Diagnosis not present

## 2020-05-20 LAB — HM DIABETES EYE EXAM

## 2020-05-22 ENCOUNTER — Other Ambulatory Visit: Payer: Self-pay | Admitting: Infectious Disease

## 2020-05-22 ENCOUNTER — Other Ambulatory Visit: Payer: Self-pay | Admitting: Internal Medicine

## 2020-05-22 DIAGNOSIS — E785 Hyperlipidemia, unspecified: Secondary | ICD-10-CM

## 2020-05-25 ENCOUNTER — Telehealth: Payer: Self-pay | Admitting: Internal Medicine

## 2020-05-25 ENCOUNTER — Ambulatory Visit: Payer: Medicare Other | Admitting: Pharmacist

## 2020-05-25 DIAGNOSIS — E7800 Pure hypercholesterolemia, unspecified: Secondary | ICD-10-CM

## 2020-05-25 DIAGNOSIS — E1165 Type 2 diabetes mellitus with hyperglycemia: Secondary | ICD-10-CM

## 2020-05-25 DIAGNOSIS — N182 Chronic kidney disease, stage 2 (mild): Secondary | ICD-10-CM

## 2020-05-25 DIAGNOSIS — I10 Essential (primary) hypertension: Secondary | ICD-10-CM

## 2020-05-25 NOTE — Telephone Encounter (Signed)
PT took blood sugar this morning and it was 92. He wants to know if he needs to take more or what he needs to do.

## 2020-05-25 NOTE — Patient Instructions (Signed)
Visit Information  PATIENT GOALS: Goals Addressed              This Visit's Progress     Patient Stated   .  Medication Management (pt-stated)        Patient Goals/Self-Care Activities . Over the next 90 days, patient will:  - take medications as prescribed check glucose twice daily, document, and provide at future appointments engage in dietary modifications by reducing carbohydrate portion sizes       Patient verbalizes understanding of instructions provided today and agrees to view in [place].    Plan: Telephone follow up appointment with care management team member scheduled for:  ~ 6 weeks as previously scheduled  Catie Darnelle Maffucci, PharmD, [place], Pink Clinical Pharmacist [employer] at [employer] [phone]

## 2020-05-25 NOTE — Telephone Encounter (Signed)
Noted  

## 2020-05-25 NOTE — Telephone Encounter (Signed)
According to last appt with Adam Becker pt was to increase his metformin, add jardiance and continue checking blood sugars twice daily, then report them at his next appt. Pt is scheduled 06/01/2020 with Dr. Derrel Nip. Does pt need to continue what he is doing currently?

## 2020-05-25 NOTE — Telephone Encounter (Signed)
Spoke with patient. Fastings in 90-110s, bedtimes in 100s for the past ~ week. Encouraged to continue what he is currently taking - metformin XR 500 mg QAM, 1000 mg QPM and Jardiance 10 mg QAM - and bring BG readings to Dr. Derrel Nip for follow up next week.

## 2020-05-25 NOTE — Chronic Care Management (AMB) (Signed)
Chronic Care Management Pharmacy Note  05/25/2020 Name:  Adam Becker MRN:  [id number] DOB:  February 08, 1955  Subjective: Adam Becker is an 65 y.o. year old male who is a primary patient of [name], [name], MD.  The CCM team was consulted for assistance with disease management and care coordination needs.    Engaged with patient by telephone for reponse to medication management questions in response to provider referral for pharmacy case management and/or care coordination services.   Consent to Services:  The patient was given information about Chronic Care Management services, agreed to services, and gave verbal consent prior to initiation of services.  Please see initial visit note for detailed documentation.   Patient Care Team: [name], MD as PCP - General (Internal Medicine) [name], [name], MD as PCP - Infectious Diseases (Infectious Diseases) De Hollingshead, RPH-CPP (Pharmacist)  Recent office visits: None since our last call  Recent consult visits: None since our last call  Hospital visits: None in previous 6 months  Objective:  Lab Results  Component Value Date   CREATININE 1.39 04/29/2020   CREATININE 1.38 01/29/2020   CREATININE 1.33 (H) 09/09/2019    Lab Results  Component Value Date   HGBA1C 12.8 (H) 04/29/2020   Last diabetic Eye exam:  Lab Results  Component Value Date/Time   HMDIABEYEEXA No Retinopathy 10/22/2018 12:00 AM    Last diabetic Foot exam: No results found for: HMDIABFOOTEX      Component Value Date/Time   CHOL 226 (H) 04/29/2020 0854   TRIG (H) 04/29/2020 0854    497.0 Triglyceride is over 400; calculations on Lipids are invalid.   HDL 38.10 (L) 04/29/2020 0854   CHOLHDL 6 04/29/2020 0854   VLDL 27.0 05/29/2019 0908   LDLCALC 119 (H) 09/09/2019 0917   LDLDIRECT 89.0 04/29/2020 0854    Hepatic Function Latest Ref Rng & Units 04/29/2020 02/05/2020 09/09/2019  Total Protein 6.0 - 8.3 g/dL 8.3 7.9 7.5  Albumin  3.5 - 5.2 g/dL 4.5 4.4 -  AST 0 - 37 U/L 28 29 32  ALT 0 - 53 U/L 38 37 33  Alk Phosphatase 39 - 117 U/L 69 52 -  Total Bilirubin 0.2 - 1.2 mg/dL 0.5 0.4 0.4  Bilirubin, Direct 0.0 - 0.3 mg/dL - 0.1 -    Lab Results  Component Value Date/Time   TSH 1.06 04/29/2020 08:54 AM   TSH 1.43 03/11/2019 08:47 AM   FREET4 0.74 01/10/2018 12:05 PM    CBC Latest Ref Rng & Units 01/29/2020 09/09/2019 02/27/2019  WBC 4.0 - 10.5 K/uL 5.6 5.4 6.0  Hemoglobin 13.0 - 17.0 g/dL 14.0 13.5 14.6  Hematocrit 39.0 - 52.0 % 41.0 40.4 41.4  Platelets 150.0 - 400.0 K/uL 344.0 365 319   Clinical ASCVD: No  The 10-year ASCVD risk score Mikey Bussing DC Jr., et al., 2013) is: 32.1%   Values used to calculate the score:     Age: 43 years     Sex: Male     Is Non-Hispanic African American: Yes     Diabetic: Yes     Tobacco smoker: No     Systolic Blood Pressure: 155 mmHg     Is BP treated: Yes     HDL Cholesterol: 38.1 mg/dL     Total Cholesterol: 226 mg/dL      Social History   Tobacco Use  Smoking Status Never Smoker  Smokeless Tobacco Never Used   BP Readings from Last 3  Encounters:  05/06/20 135/79  04/29/20 132/86  01/29/20 130/70   Pulse Readings from Last 3 Encounters:  05/06/20 78  04/29/20 72  01/29/20 80   Wt Readings from Last 3 Encounters:  04/29/20 230 lb 3.2 oz (104.4 kg)  01/29/20 248 lb 3.2 oz (112.6 kg)  12/09/19 236 lb (107 kg)    Assessment: Review of patient past medical history, allergies, medications, health status, including review of consultants reports, laboratory and other test data, was performed as part of comprehensive evaluation and provision of chronic care management services.   SDOH:  (Social Determinants of Health) assessments and interventions performed:  SDOH Interventions   Flowsheet Row Most Recent Value  SDOH Interventions   Financial Strain Interventions Intervention Not Indicated      CCM Care Plan  No Known Allergies  Medications Reviewed Today     Reviewed by Adair Laundry, [place] (Certified Medical Assistant) on 04/29/20 at [phone]  Med List Status: <None>  Medication Order Taking? Sig Documenting Provider Last Dose Status Informant  allopurinol (ZYLOPRIM) 100 MG tablet [id number] Yes TAKE 1 TABLET BY MOUTH EVERY DAY [name], MD Taking Active   amLODipine (NORVASC) 5 MG tablet [id number] Yes TAKE 1 TABLET (5 MG TOTAL) BY MOUTH DAILY. [name], MD Taking Active   aspirin EC 81 MG tablet [id number] Yes Take 1 tablet (81 mg total) by mouth daily. [name], FNP Taking Active   celecoxib (CELEBREX) 200 MG capsule [id number] Yes TAKE 1 CAPSULE BY MOUTH EVERY DAY [name], MD Taking Active   darunavir (PREZISTA) 800 MG tablet [id number] Yes TAKE 1 TABLET (800 MG TOTAL) BY MOUTH DAILY. [name], [name], MD Taking Active   elvitegravir-cobicistat-emtricitabine-tenofovir (GENVOYA) 150-150-200-10 MG TABS tablet [id number] Yes TAKE 1 TABLET BY MOUTH EVERY DAY WITH BREAKFAST [name], [name], MD Taking Active   gabapentin (NEURONTIN) 300 MG capsule [id number] Yes TAKE 1 CAPSULE BY MOUTH THREE TIMES A DAY [name], MD Taking Active   pravastatin (PRAVACHOL) 40 MG tablet [id number] Yes TAKE 1 TABLET BY MOUTH EVERY DAY [name], [name], MD Taking Active   rOPINIRole (REQUIP) 0.25 MG tablet [id number] Yes Take 0.75 mg by mouth 3 (three) times daily. [provider] Taking Active   telmisartan (MICARDIS) 40 MG tablet [id number] Yes TAKE 1 TABLET BY MOUTH EVERYDAY AT BEDTIME [name], MD Taking Active   vitamin B-12 (CYANOCOBALAMIN) 1000 MCG tablet [id number] Yes Take 1,000 mcg by mouth daily. [provider] Taking Active           Patient Active Problem List   Diagnosis Date Noted  . RLQ abdominal pain 01/29/2020  . Suspected COVID-19 virus infection 12/09/2019  . 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 Fracture of phalanx of foot 06/24/2019  . Avulsion of toenail of right foot 06/16/2019  .  Abdominal wall mass 03/11/2019  . Family history of colon cancer in father 08/10/2018  . Right flank pain 02/09/2018  . Neuropathy 01/12/2018  . Diabetic nephropathy with proteinuria ([place]) 01/12/2018  . Uncontrolled diabetes mellitus ([place]) 01/11/2018  . Elevated liver function tests 11/27/2017  . Vertigo 09/11/2017  . TIA (transient ischemic attack) 08/18/2017  . Need for immunization against influenza 10/10/2016  . Prostate cancer screening 10/10/2016  . 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 Routine adult health maintenance 04/23/2014  . Allergic rhinitis due to allergen 04/23/2014  . 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 Gout attack 09/20/2012  .  Obesity (BMI 30.0-34.9) 12/09/2011  . Hyperlipidemia 11/01/2009  . NEUROSYPHILIS 03/11/2009  . PARESTHESIA, HANDS 06/18/2008  . LATE SYPHILIS, LATENT 10/30/2007  . ERECTILE DYSFUNCTION, MILD 12/31/2006  . HYPERTENSION, BENIGN ESSENTIAL 10/15/2006  . Human immunodeficiency virus (HIV) disease ([place]) 04/16/2006    Immunization History  Administered Date(s) Administered  . H1N1 01/02/2008  . Hepatitis A 12/31/2006, 07/15/2007  . Hepatitis B 03/26/2001, 05/06/2001, 10/14/2001  . Influenza Inj Mdck Quad Pf 10/12/2017  . Influenza Split 11/15/2010, 12/07/2011  . Influenza Whole 10/02/2005, 10/15/2006, 11/26/2008, 11/01/2009  . Influenza,inj,Quad PF,6+ Mos 10/07/2012, 11/05/2013, 11/11/2014, 10/12/2015, 10/10/2016, 10/24/2019  . Influenza-Unspecified 09/28/2018  . Meningococcal Conjugate 10/09/2019  . Meningococcal Polysaccharide 10/12/2015  . PFIZER(Purple Top)SARS-COV-2 Vaccination 03/28/2019, 04/18/2019, 09/23/2019  . Pneumococcal Conjugate-13 06/21/2011  . Pneumococcal Polysaccharide-23 09/23/2005, 10/02/2005, 02/29/2012, 09/23/2019    Conditions to be addressed/monitored: HTN, HLD and DMII  Care Plan : Medication Management  Updates made by De Hollingshead, RPH-CPP since  05/25/2020 12:00 AM    Problem: Diabetes, HTN, HLD     Long-Range Goal: Disease Progression Prevention   Start Date: 05/13/2020  This Visit's Progress: On track  Recent Progress: On track  Priority: High  Note:   Current Barriers:  . Unable to achieve control of diabetes   Pharmacist Clinical Goal(s):  Marland Kitchen Over the next 90 days, patient will achieve control of diabetes as evidenced by A1c through collaboration with PharmD and provider.   Interventions: . 1:1 collaboration with [name], MD regarding development and update of comprehensive plan of care as evidenced by provider attestation and co-signature . Inter-disciplinary care team collaboration (see longitudinal plan of care) . Comprehensive medication review performed; medication list updated in electronic medical record  Diabetes: . Uncontrolled; current treatment: metformin XR 500 mg QAM, 1000 mg QPM, Jardiance 10 mg daily . Current glucose readings: fasting glucose: 90-110s, evening readings: 100-110s over the past week . Current exercise: tries to walk on treadmill in the morning, has incorporated weights every other day . Reviewed goal A1c, goal fasting, goal 2 hour post prandial glucose.  . Educated on macro and microvascular complications of diabetes.  . Recommended to continue current regimen at this time. Bring glucose readings to Dr. Lupita Dawn next appointment for follow up.   Hypertension: . Uncontrolled at last office visit; current treatment: telmisartan 40 mg daily . Current home readings: not checking at home . Continue current regimen at this time. Follow up at next PCP visit .  Hyperlipidemia: . Controlled; current treatment: pravastatin 40 mg daily - holding d/t elevated CK . Antiplatelet regimen: aspirin 81 mg dialy  . Reported muscle aches to PCP on last appointment. Notes that there wasn't "pain", but some aches that seemed to occur with peripheral neuropathy "shooting pain". PCP checked CK, was  elevated. Holding pravastatin. Following up with PCP next week  HIV: . Managed by ID; current regimen: Prezista 800 mg daily, Genvoya 150/150/200/10 mg  . Previously recommended to continue current regimen at this time  Peripheral Neuropathy; . Moderately well managed; current regimen: gabapenin 300 mg TID, though reports he is taking 600 mg TID; ropinirole 0.25 mg TID, appears neurology was going to increase to XR 4 mg daily, but patient notes he has not received the order yet. Follows w/ neurology Dr. Manuella Ghazi . Ferritin at goal.  . Previously recommended to communicate w/ neurology regarding treatment plans. Per chart review, gabapentin is being tapered.  Gout: . Controlled; current regimen: allopurinol 100 mg daily . Last uric acid at  goal . Previously recommended to continue current regimen at this time  Patient Goals/Self-Care Activities . Over the next 90 days, patient will:  - take medications as prescribed check glucose twice daily, document, and provide at future appointments engage in dietary modifications by reducing carbohydrate portion sizes  Follow Up Plan: Telephone follow up appointment with care management team member scheduled for: ~ 4 weeks as previously schedule d     Medication Assistance: None required.  Patient affirms current coverage meets needs.  Patient's preferred pharmacy is:  [id number] [place] [address] [id number]: [phone]: [phone] Follow Up:  Patient agrees to Care Plan and Follow-up.  Plan: Telephone follow up appointment with care management team member scheduled for:  ~ 6 weeks as previously scheduled  Catie TDarnelle Maffucci PharmD, BGarrison CLiberalClinical Pharmacist LOccidental [employer] BJohnson [employer] [phone]

## 2020-06-01 ENCOUNTER — Ambulatory Visit (INDEPENDENT_AMBULATORY_CARE_PROVIDER_SITE_OTHER): Payer: Medicare Other | Admitting: Internal Medicine

## 2020-06-01 ENCOUNTER — Encounter: Payer: Self-pay | Admitting: Internal Medicine

## 2020-06-01 ENCOUNTER — Other Ambulatory Visit: Payer: Self-pay

## 2020-06-01 VITALS — BP 116/76 | HR 69 | Temp 97.2°F | Resp 15 | Ht 71.0 in | Wt 224.6 lb

## 2020-06-01 DIAGNOSIS — B20 Human immunodeficiency virus [HIV] disease: Secondary | ICD-10-CM | POA: Diagnosis not present

## 2020-06-01 DIAGNOSIS — M791 Myalgia, unspecified site: Secondary | ICD-10-CM | POA: Diagnosis not present

## 2020-06-01 DIAGNOSIS — R748 Abnormal levels of other serum enzymes: Secondary | ICD-10-CM | POA: Diagnosis not present

## 2020-06-01 DIAGNOSIS — I129 Hypertensive chronic kidney disease with stage 1 through stage 4 chronic kidney disease, or unspecified chronic kidney disease: Secondary | ICD-10-CM

## 2020-06-01 DIAGNOSIS — E669 Obesity, unspecified: Secondary | ICD-10-CM | POA: Diagnosis not present

## 2020-06-01 DIAGNOSIS — G720 Drug-induced myopathy: Secondary | ICD-10-CM | POA: Diagnosis not present

## 2020-06-01 DIAGNOSIS — E119 Type 2 diabetes mellitus without complications: Secondary | ICD-10-CM

## 2020-06-01 DIAGNOSIS — R222 Localized swelling, mass and lump, trunk: Secondary | ICD-10-CM

## 2020-06-01 DIAGNOSIS — N182 Chronic kidney disease, stage 2 (mild): Secondary | ICD-10-CM | POA: Diagnosis not present

## 2020-06-01 DIAGNOSIS — T466X5A Adverse effect of antihyperlipidemic and antiarteriosclerotic drugs, initial encounter: Secondary | ICD-10-CM

## 2020-06-01 DIAGNOSIS — Z9229 Personal history of other drug therapy: Secondary | ICD-10-CM

## 2020-06-01 DIAGNOSIS — E1169 Type 2 diabetes mellitus with other specified complication: Secondary | ICD-10-CM | POA: Diagnosis not present

## 2020-06-01 DIAGNOSIS — I152 Hypertension secondary to endocrine disorders: Secondary | ICD-10-CM

## 2020-06-01 DIAGNOSIS — Z800 Family history of malignant neoplasm of digestive organs: Secondary | ICD-10-CM

## 2020-06-01 DIAGNOSIS — E1159 Type 2 diabetes mellitus with other circulatory complications: Secondary | ICD-10-CM

## 2020-06-01 MED ORDER — EMPAGLIFLOZIN 10 MG PO TABS: 10.0000 mg | ORAL_TABLET | Freq: Every day | ORAL | 11 refills | Status: DC

## 2020-06-01 MED ORDER — ALPRAZOLAM 0.25 MG PO TABS: 0.2500 mg | ORAL_TABLET | Freq: Every evening | ORAL | 0 refills | Status: DC | PRN

## 2020-06-01 NOTE — Patient Instructions (Addendum)
Congtulations!  You're under control!    Goal weight to get BMI < 30  Is 215  Lbs   No changes to diabetes med today unless you decide you want metformin XR to reduce the incidence of soft stools   Return for a1c on or after July 8

## 2020-06-01 NOTE — Progress Notes (Signed)
Subjective:  Patient ID: Adam Becker, male    DOB: Jun 29, 1955  Age: 65 y.o. MRN: [id number]  CC: The primary encounter diagnosis was COVID-19 vaccine series completed. Diagnoses of Family history of colon cancer in father, Muscle pain, Elevated CK, Statin myopathy, Nephropathy hypertensive, Obesity, diabetes, and hypertension syndrome ([place]), Abdominal wall mass, Chronic kidney disease (CKD), stage II (mild), and Human immunodeficiency virus (HIV) disease ([place]) were also pertinent to this visit.  HPI Adam Becker presents for one month follow up on uncontrolled type 2 DM secondary to being lost to follow up for a year.   This visit occurred during the SARS-CoV-2 public health emergency.  Safety protocols were in place, including screening questions prior to the visit, additional usage of staff PPE, and extensive cleaning of exam room while observing appropriate contact time as indicated for disinfecting solutions.   Lab Results  Component Value Date   HGBA1C 12.8 (H) 04/29/2020   Type 2 DM:  Sugars are at goal based on home reports . He is currently taking Jardiance and metformin  Muscle cramps have resolved since stopping the pravastatin .   Chronic insomnia:  Patient  has been having some trouble sleeping several nights per week .  Wakes up 2 to 3 times per night  And often can't turn brain off at bedtime.  . Bedtime hygiene reviewed,  Patient does not use an electronic book to read before bed.  Does not drink caffeinated beverages after 3 PM.  Only voids  bladder once per night.  No snoring partner. Does not drink alcohol at all.  Not exercising excessively in the evening. Patient does have a history of anxiety and often lies awake worrying about issues that cannot be resolved. Does not take stimulants.   HTN:  Checking bp at home,  Taking telmsartan 40 mg  Highest home reading in the last month was 140/81 ,  Lowest 105/79  Outpatient Medications Prior to Visit  Medication Sig  Dispense Refill  . allopurinol (ZYLOPRIM) 100 MG tablet TAKE 1 TABLET BY MOUTH EVERY DAY 90 tablet 1  . aspirin EC 81 MG tablet Take 1 tablet (81 mg total) by mouth daily. 90 tablet 3  . blood glucose meter kit and supplies KIT Dispense based on patient and insurance preference. Use up to four times daily as directed. 1 each 11  . darunavir (PREZISTA) 800 MG tablet TAKE 1 TABLET (800 MG TOTAL) BY MOUTH DAILY. 30 tablet 11  . elvitegravir-cobicistat-emtricitabine-tenofovir (GENVOYA) 150-150-200-10 MG TABS tablet TAKE 1 TABLET BY MOUTH EVERY DAY WITH BREAKFAST 30 tablet 11  . ergocalciferol (VITAMIN D2) 1.25 MG (50000 UT) capsule Take 1 capsule by mouth once a week.    . gabapentin (NEURONTIN) 300 MG capsule TAKE 1 CAPSULE BY MOUTH THREE TIMES A DAY 90 capsule 2  . glucose blood test strip Use as instructed to check glucose BID 100 each 12  . Lancets (ONETOUCH ULTRASOFT) lancets Use as instructed to check glucose BID 100 each 12  . metFORMIN (GLUCOPHAGE) 500 MG tablet Take 2 tablets (1,000 mg total) by mouth 2 (two) times daily with a meal. 360 tablet 3  . telmisartan (MICARDIS) 40 MG tablet TAKE 1 TABLET BY MOUTH EVERYDAY AT BEDTIME 90 tablet 1  . vitamin B-12 (CYANOCOBALAMIN) 1000 MCG tablet Take 1,000 mcg by mouth daily.    . empagliflozin (JARDIANCE) 10 MG TABS tablet Take 1 tablet (10 mg total) by mouth daily before breakfast. 30 tablet 0  . rOPINIRole (REQUIP)  0.25 MG tablet Take 0.25 mg by mouth 3 (three) times daily. (Patient not taking: Reported on 06/01/2020)    . pravastatin (PRAVACHOL) 40 MG tablet TAKE 1 TABLET BY MOUTH EVERY DAY (Patient not taking: Reported on 06/01/2020) 90 tablet 0   No facility-administered medications prior to visit.    Review of Systems;  Patient denies headache, fevers, malaise, unintentional weight loss, skin rash, eye pain, sinus congestion and sinus pain, sore throat, dysphagia,  hemoptysis , cough, dyspnea, wheezing, chest pain, palpitations, orthopnea,  edema, abdominal pain, nausea, melena, diarrhea, constipation, flank pain, dysuria, hematuria, urinary  Frequency, nocturia, numbness, tingling, seizures,  Focal weakness, Loss of consciousness,  Tremor, insomnia, depression, anxiety, and suicidal ideation.      Objective:  BP 116/76 (BP Location: Left Arm, Patient Position: Sitting, Cuff Size: Large)   Pulse 69   Temp (!) 97.2 F (36.2 C) (Oral)   Resp 15   Ht '5\' 11"'  (1.803 m)   Wt 224 lb 9.6 oz (101.9 kg)   SpO2 96%   BMI 31.33 kg/m   BP Readings from Last 3 Encounters:  06/01/20 116/76  05/06/20 135/79  04/29/20 132/86    Wt Readings from Last 3 Encounters:  06/01/20 224 lb 9.6 oz (101.9 kg)  04/29/20 230 lb 3.2 oz (104.4 kg)  01/29/20 248 lb 3.2 oz (112.6 kg)    General appearance: alert, cooperative and appears stated age Ears: normal TM's and external ear canals both ears Throat: lips, mucosa, and tongue normal; teeth and gums normal Neck: no adenopathy, no carotid bruit, supple, symmetrical, trachea midline and thyroid not enlarged, symmetric, no tenderness/mass/nodules Back: symmetric, no curvature. ROM normal. No CVA tenderness. Lungs: clear to auscultation bilaterally Heart: regular rate and rhythm, S1, S2 normal, no murmur, click, rub or gallop Abdomen: soft, non-tender; bowel sounds normal; no masses,  no organomegaly Pulses: 2+ and symmetric Skin: Skin color, texture, turgor normal. No rashes or lesions Lymph nodes: Cervical, supraclavicular, and axillary nodes normal.  Lab Results  Component Value Date   HGBA1C 12.8 (H) 04/29/2020   HGBA1C 6.4 05/29/2019   HGBA1C 6.7 (H) 03/11/2019    Lab Results  Component Value Date   CREATININE 1.39 04/29/2020   CREATININE 1.38 01/29/2020   CREATININE 1.33 (H) 09/09/2019    Lab Results  Component Value Date   WBC 5.6 01/29/2020   HGB 14.0 01/29/2020   HCT 41.0 01/29/2020   PLT 344.0 01/29/2020   GLUCOSE 494 (H) 04/29/2020   CHOL 226 (H) 04/29/2020   TRIG  (H) 04/29/2020    497.0 Triglyceride is over 400; calculations on Lipids are invalid.   HDL 38.10 (L) 04/29/2020   LDLDIRECT 89.0 04/29/2020   LDLCALC 119 (H) 09/09/2019   ALT 38 04/29/2020   AST 28 04/29/2020   NA 134 (L) 04/29/2020   K 4.5 04/29/2020   CL 97 04/29/2020   CREATININE 1.39 04/29/2020   BUN 22 04/29/2020   CO2 28 04/29/2020   TSH 1.06 04/29/2020   PSA 0.45 04/29/2020   HGBA1C 12.8 (H) 04/29/2020   MICROALBUR 23.4 (H) 04/29/2020    No results found.  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  GFR > 50      Family  history of colon cancer in father   Relevant Orders   Ambulatory referral to Gastroenterology   Human immunodeficiency virus (HIV) disease ([place])    Managed by Dr [name] at [facility].  Last visit was in 2020 and He has been lost to annual follow up (Nov 2021 was anticipated ) and his last viral load was not suppressed.  Encouraged to follow up  With ID    Lab Results  Component Value Date   CD4TCELL 32 (L) 09/09/2019   CD4TABS 932 09/09/2019         Nephropathy hypertensive    He is tolerating ARB and BPs are at goal.  No changes today  Lab Results  Component Value Date   CREATININE 1.39 04/29/2020   Lab Results  Component Value Date   MICROALBUR 23.4 (H) 04/29/2020   MICROALBUR 5.7 (H) 03/11/2019           Obesity, diabetes, and hypertension syndrome ([facility])    Recent loss of glycemic control and worsening proteinuria  due to being lost to follow up.  Blood sugars now at goal with addition of Jardiance to metforminhyperliid, and BP is at goal with ARB. Hyperlipidemia now untreated due to statin myopathy suggested by elevated CK , normal ANA and history of muscle pain ,  With significant improvement in signs and symptoms upon  suspension of pravastatin . Not clear if there was an interaction with his HAART therapy.  Will discuss addition of Zetia with Dr. Darnelle Maffucci and also consider adding Ozempic at follow up if weight loss plateaus and BMI remains > 30 at next visit .  Repeat A1c needed on or after July 8    Lab Results  Component Value Date   HGBA1C 12.8 (H) 04/29/2020   Lab Results  Component Value Date   MICROALBUR 23.4 (H) 04/29/2020   MICROALBUR 5.7 (H) 03/11/2019           Relevant Medications   empagliflozin (JARDIANCE) 10 MG TABS tablet   Statin myopathy    He had muscle pain accompanied by CK of > 700,  Which which has nearly normalized since suspending pravastatin.  ANA was normal .   Lab Results  Component Value Date   CKTOTAL 312 (H) 06/01/2020   CKMB 2.7 11/26/2011   TROPONINI < 0.02 11/26/2011         Other Visit Diagnoses    COVID-19 vaccine series completed    -  Primary   Relevant Orders   SARS-CoV-2 Semi-Quantitative Total Antibody, Spike   Muscle pain       Relevant Orders   CK (Creatine Kinase) (Completed)   Elevated CK       Relevant Orders   Sedimentation rate (Completed)   ANA (Completed)     A total of 40 minutes was spent with patient more than half of which was spent in counseling patient on diabetes management , reviewing and explaining recent labs and imaging studies done, and coordination of care.   I have discontinued Raye Slyter. Hershberger's pravastatin. I am also having him start on ALPRAZolam. Additionally, I am having him maintain his aspirin EC, darunavir, Genvoya, vitamin B-12, telmisartan, gabapentin, rOPINIRole, ergocalciferol, metFORMIN, blood glucose meter kit and supplies, glucose blood, onetouch ultrasoft, allopurinol, and empagliflozin.  Meds ordered this encounter  Medications  . empagliflozin (JARDIANCE) 10 MG TABS tablet    Sig: Take 1 tablet (10 mg total) by mouth daily before breakfast.    Dispense:  30 tablet  Refill:  11  . ALPRAZolam  (XANAX) 0.25 MG tablet    Sig: Take 1 tablet (0.25 mg total) by mouth at bedtime as needed for anxiety.    Dispense:  15 tablet    Refill:  0    Medications Discontinued During This Encounter  Medication Reason  . empagliflozin (JARDIANCE) 10 MG TABS tablet Reorder  . pravastatin (PRAVACHOL) 40 MG tablet Allergic reaction    Follow-up: Return in about 6 months (around 12/02/2020).   [name], MD

## 2020-06-02 LAB — CK: Total CK: 312 U/L — ABNORMAL HIGH (ref 7–232)

## 2020-06-02 LAB — SEDIMENTATION RATE: Sed Rate: 35 mm/hr — ABNORMAL HIGH (ref 0–20)

## 2020-06-03 DIAGNOSIS — T466X5A Adverse effect of antihyperlipidemic and antiarteriosclerotic drugs, initial encounter: Secondary | ICD-10-CM | POA: Insufficient documentation

## 2020-06-03 DIAGNOSIS — R748 Abnormal levels of other serum enzymes: Secondary | ICD-10-CM | POA: Insufficient documentation

## 2020-06-03 DIAGNOSIS — G720 Drug-induced myopathy: Secondary | ICD-10-CM | POA: Insufficient documentation

## 2020-06-03 LAB — SARS-COV-2 SEMI-QUANTITATIVE TOTAL ANTIBODY, SPIKE: SARS COV2 AB, Total Spike Semi QN: >2500 U/mL — ABNORMAL HIGH (ref <0.8)

## 2020-06-03 LAB — ANA: Anti Nuclear Antibody (ANA): NEGATIVE

## 2020-06-03 NOTE — Progress Notes (Signed)
Your recent labs suggest that the pravastatin was the cause of your muscle pain.  Do NOT restart it.  We will recheck your cholesterol at your next lab visit in July,  and discuss alternative therapy.  I noticed that you have not seen Dr Drucilla Schmidt in 1.5 years ; please schedule an appt with him ASAP so you can have your CD4 count etc checked to make sure your therapy is still working to suppress your HIV

## 2020-06-03 NOTE — Assessment & Plan Note (Signed)
He has deferred Gen Surg consult for now for presumed incisional hernia

## 2020-06-03 NOTE — Assessment & Plan Note (Addendum)
Managed by Dr [name] at [facility].  Last visit was in 2020 and He has been lost to annual follow up (Nov 2021 was anticipated ) and his last viral load was not suppressed.  Encouraged to follow up  With ID    Lab Results  Component Value Date   CD4TCELL 32 (L) 09/09/2019   CD4TABS 932 09/09/2019

## 2020-06-03 NOTE — Assessment & Plan Note (Signed)
Stable.  GFR > 50

## 2020-06-03 NOTE — Assessment & Plan Note (Signed)
He is tolerating ARB and BPs are at goal.  No changes today  Lab Results  Component Value Date   CREATININE 1.39 04/29/2020   Lab Results  Component Value Date   MICROALBUR 23.4 (H) 04/29/2020   MICROALBUR 5.7 (H) 03/11/2019

## 2020-06-03 NOTE — Assessment & Plan Note (Signed)
He had muscle pain accompanied by CK of > 700,  Which which has nearly normalized since suspending pravastatin.  ANA was normal .   Lab Results  Component Value Date   CKTOTAL 312 (H) 06/01/2020   CKMB 2.7 11/26/2011   TROPONINI < 0.02 11/26/2011

## 2020-06-03 NOTE — Assessment & Plan Note (Addendum)
Recent loss of glycemic control and worsening proteinuria  due to being lost to follow up.  Blood sugars now at goal with addition of Jardiance to metforminhyperliid, and BP is at goal with ARB. Hyperlipidemia now untreated due to statin myopathy suggested by elevated CK , normal ANA and history of muscle pain ,  With significant improvement in signs and symptoms upon suspension of pravastatin . Not clear if there was an interaction with his HAART therapy.  Will discuss addition of Zetia with Dr. Darnelle Maffucci and also consider adding Ozempic at follow up if weight loss plateaus and BMI remains > 30 at next visit .  Repeat A1c needed on or after July 8    Lab Results  Component Value Date   HGBA1C 12.8 (H) 04/29/2020   Lab Results  Component Value Date   MICROALBUR 23.4 (H) 04/29/2020   MICROALBUR 5.7 (H) 03/11/2019

## 2020-06-10 ENCOUNTER — Other Ambulatory Visit: Payer: Self-pay

## 2020-06-10 ENCOUNTER — Other Ambulatory Visit: Payer: Medicare Other

## 2020-06-10 ENCOUNTER — Ambulatory Visit: Payer: Medicare Other | Admitting: Infectious Disease

## 2020-06-10 DIAGNOSIS — A528 Late syphilis, latent: Secondary | ICD-10-CM

## 2020-06-10 DIAGNOSIS — Z23 Encounter for immunization: Secondary | ICD-10-CM | POA: Diagnosis not present

## 2020-06-10 DIAGNOSIS — E785 Hyperlipidemia, unspecified: Secondary | ICD-10-CM

## 2020-06-10 DIAGNOSIS — Q8901 Asplenia (congenital): Secondary | ICD-10-CM

## 2020-06-10 DIAGNOSIS — B20 Human immunodeficiency virus [HIV] disease: Secondary | ICD-10-CM | POA: Diagnosis not present

## 2020-06-11 LAB — T-HELPER CELL (CD4) - (RCID CLINIC ONLY)
CD4 % Helper T Cell: 38 % (ref 33–65)
CD4 T Cell Abs: 1286 /uL (ref 400–1790)

## 2020-06-14 LAB — COMPLETE METABOLIC PANEL WITH GFR
AG Ratio: 1.3 (calc) (ref 1.0–2.5)
ALT: 27 U/L (ref 9–46)
AST: 29 U/L (ref 10–35)
Albumin: 4.5 g/dL (ref 3.6–5.1)
Alkaline phosphatase (APISO): 51 U/L (ref 35–144)
BUN/Creatinine Ratio: 14 (calc) (ref 6–22)
BUN: 18 mg/dL (ref 7–25)
CO2: 27 mmol/L (ref 20–32)
Calcium: 9.7 mg/dL (ref 8.6–10.3)
Chloride: 102 mmol/L (ref 98–110)
Creat: 1.31 mg/dL — ABNORMAL HIGH (ref 0.70–1.25)
GFR, Est African American: 67 mL/min/{1.73_m2} (ref >=60)
GFR, Est Non African American: 58 mL/min/{1.73_m2} — ABNORMAL LOW (ref >=60)
Globulin: 3.6 g/dL (calc) (ref 1.9–3.7)
Glucose, Bld: 95 mg/dL (ref 65–99)
Potassium: 4.3 mmol/L (ref 3.5–5.3)
Sodium: 138 mmol/L (ref 135–146)
Total Bilirubin: 0.5 mg/dL (ref 0.2–1.2)
Total Protein: 8.1 g/dL (ref 6.1–8.1)

## 2020-06-14 LAB — CBC WITH DIFFERENTIAL/PLATELET
Absolute Monocytes: 660 cells/uL (ref 200–950)
Basophils Absolute: 59 cells/uL (ref 0–200)
Basophils Relative: 0.9 %
Eosinophils Absolute: 257 cells/uL (ref 15–500)
Eosinophils Relative: 3.9 %
HCT: 40.6 % (ref 38.5–50.0)
Hemoglobin: 14.1 g/dL (ref 13.2–17.1)
Lymphs Abs: 3643 cells/uL (ref 850–3900)
MCH: 33.1 pg — ABNORMAL HIGH (ref 27.0–33.0)
MCHC: 34.7 g/dL (ref 32.0–36.0)
MCV: 95.3 fL (ref 80.0–100.0)
MPV: 9.6 fL (ref 7.5–12.5)
Monocytes Relative: 10 %
Neutro Abs: 1980 cells/uL (ref 1500–7800)
Neutrophils Relative %: 30 %
Platelets: 481 10*3/uL — ABNORMAL HIGH (ref 140–400)
RBC: 4.26 10*6/uL (ref 4.20–5.80)
RDW: 13.8 % (ref 11.0–15.0)
Total Lymphocyte: 55.2 %
WBC: 6.6 10*3/uL (ref 3.8–10.8)

## 2020-06-14 LAB — RPR TITER: RPR Titer: 1:2 {titer} — ABNORMAL HIGH

## 2020-06-14 LAB — RPR: RPR Ser Ql: REACTIVE — AB

## 2020-06-14 LAB — FLUORESCENT TREPONEMAL AB(FTA)-IGG-BLD: Fluorescent Treponemal ABS: REACTIVE — AB

## 2020-06-14 LAB — HIV-1 RNA QUANT-NO REFLEX-BLD
HIV 1 RNA Quant: 44 Copies/mL — ABNORMAL HIGH
HIV-1 RNA Quant, Log: 1.64 Log cps/mL — ABNORMAL HIGH

## 2020-06-27 ENCOUNTER — Other Ambulatory Visit: Payer: Self-pay | Admitting: Internal Medicine

## 2020-07-01 ENCOUNTER — Ambulatory Visit (INDEPENDENT_AMBULATORY_CARE_PROVIDER_SITE_OTHER): Payer: Medicare Other | Admitting: Infectious Disease

## 2020-07-01 ENCOUNTER — Encounter: Payer: Self-pay | Admitting: Infectious Disease

## 2020-07-01 ENCOUNTER — Other Ambulatory Visit: Payer: Self-pay

## 2020-07-01 VITALS — Wt 227.4 lb

## 2020-07-01 DIAGNOSIS — B20 Human immunodeficiency virus [HIV] disease: Secondary | ICD-10-CM | POA: Diagnosis not present

## 2020-07-01 DIAGNOSIS — E7800 Pure hypercholesterolemia, unspecified: Secondary | ICD-10-CM

## 2020-07-01 DIAGNOSIS — G720 Drug-induced myopathy: Secondary | ICD-10-CM

## 2020-07-01 DIAGNOSIS — T466X5A Adverse effect of antihyperlipidemic and antiarteriosclerotic drugs, initial encounter: Secondary | ICD-10-CM | POA: Diagnosis not present

## 2020-07-01 DIAGNOSIS — N182 Chronic kidney disease, stage 2 (mild): Secondary | ICD-10-CM | POA: Diagnosis not present

## 2020-07-01 DIAGNOSIS — A528 Late syphilis, latent: Secondary | ICD-10-CM | POA: Diagnosis not present

## 2020-07-01 MED ORDER — DARUNAVIR 800 MG PO TABS: 800.0000 mg | ORAL_TABLET | Freq: Every day | ORAL | 11 refills | Status: DC

## 2020-07-01 MED ORDER — GENVOYA 150-150-200-10 MG PO TABS: ORAL_TABLET | ORAL | 11 refills | Status: DC

## 2020-07-01 NOTE — Progress Notes (Signed)
Subjective:  Chief complaint: followup for HIV disease   Patient ID: Adam Becker, male    DOB: 1955-03-15, 65 y.o.   MRN: [id number]  HPI  65  year old man with HIV currently well controlled on Genvoya with Prezista.  Adam Becker had compiled prior R data as seen below  HIV Genotype Composite Data Genotype Dates:    Mutations in [place] impact drug susceptibility RT Mutations G190EQ, M41L, D67N, K70R, L74V, V75A, M184V, T215Y, K219Q  PI Mutations D30N, N88D  Integrase Mutations      Interpretation of Genotype Data per [facility] HIV Database Nucleoside RTIs  abacavir (ABC) High-Level Resistance zidovudine (AZT)  High-Level Resistance stavudine (D4T)  High-Level Resistance didanosine (DDI)   High-Level Resistance emtricitabine (FTC)  High-Level Resistance lamivudine (3TC)  High-Level Resistance tenofovir (TDF)  Intermediate Resistance    Non-Nucleoside RTIs  efavirenz (EFV)  High-Level Resistance etravirine (ETR)  Intermediate Resistance nevirapine (NVP) High-Level Resistance rilpivirine (RPV) High-Level Resistance    Protease Inhibitors  atazanavir/r (ATV/r)           Potential Low-Level Resistance darunavir/r (DRV/r)           Susceptible fosamprenavir/r (FPV/r)           Susceptible indinavir/r (IDV/r) Susceptible lopinavir/r (LPV/r)            Susceptible saquinavir/r (SQV/r)           Potential Low-Level Resistance tipranavir/r (TPV/r)           Susceptible    Integrase Inhibitors  None   He was maintained nice virological suppression on Genvoya and Prezista.  In the past switch to [place] or consideration of Biktarvy + BID Rukobia (I would go to latter if trying to avoid COBI.  Today he seemed fairly happy with his regimen.  He did have myopathy related to his statin that has resolved (had pain in his legs that is now resolved)      Past Medical History:  Diagnosis Date   Asplenia 11/11/2014   Chronic kidney disease Nov 2013   admission  for [facility] cr 11, acidosis   HIV infection (Adam Becker)    Hyperglycemia 04/04/2016   Hyperlipidemia    Hypertension    Impaired fasting glucose 07/27/2015   Urethritis 02/11/2015    Past Surgical History:  Procedure Laterality Date   COLONOSCOPY     LIPOSUCTION HEAD / NECK  2001   posterior neck   SPLENECTOMY  2000   reason unclear "stopped working"    Family History  Problem Relation Age of Onset   Cancer Mother    Breast cancer Mother    Colon cancer Father    Birth defects Son        Breast       Social History   Socioeconomic History   Marital status: Single    Spouse name: Not on file   Number of children: Not on file   Years of education: Not on file   Highest education level: Not on file  Occupational History   Not on file  Tobacco Use   Smoking status: Never   Smokeless tobacco: Never  Vaping Use   Vaping Use: Never used  Substance and Sexual Activity   Alcohol use: No   Drug use: No   Sexual activity: Not Currently    Comment: declined condoms  Other Topics Concern   Not on file  Social History Narrative   Independent baseline. Lives by himself.  Education 12th.  Children none.  Caffeine tea one cup 4 x week.   Social Determinants of Health   Financial Resource Strain: Low Risk    Difficulty of Paying Living Expenses: Not hard at all  Food Insecurity: No Food Insecurity   Worried About Charity fundraiser in the Last Year: Never true   [place] in the Last Year: Never true  Transportation Needs: No Transportation Needs   Lack of Transportation (Medical): No   Lack of Transportation (Non-Medical): No  Physical Activity: Not on file  Stress: No Stress Concern Present   Feeling of Stress : Not at all  Social Connections: Unknown   Frequency of Communication with Friends and Family: More than three times a week   Frequency of Social Gatherings with Friends and Family: More than three times a week   Attends Religious Services: More than 4 times per  year   Active Member of Clubs or Organizations: Yes   Attends Music therapist: More than 4 times per year   Marital Status: Not on file    Allergies  Allergen Reactions   Pravastatin Other (See Comments)    rhabdomyolysis     Current Outpatient Medications:    darunavir (PREZISTA) 800 MG tablet, Take 1 tablet (800 mg total) by mouth daily., Disp: 60 tablet, Rfl: 11   allopurinol (ZYLOPRIM) 100 MG tablet, TAKE 1 TABLET BY MOUTH EVERY DAY, Disp: 90 tablet, Rfl: 1   ALPRAZolam (XANAX) 0.25 MG tablet, Take 1 tablet (0.25 mg total) by mouth at bedtime as needed for anxiety., Disp: 15 tablet, Rfl: 0   aspirin EC 81 MG tablet, Take 1 tablet (81 mg total) by mouth daily., Disp: 90 tablet, Rfl: 3   blood glucose meter kit and supplies KIT, Dispense based on patient and insurance preference. Use up to four times daily as directed., Disp: 1 each, Rfl: 11   elvitegravir-cobicistat-emtricitabine-tenofovir (GENVOYA) 150-150-200-10 MG TABS tablet, TAKE 1 TABLET BY MOUTH EVERY DAY WITH BREAKFAST, Disp: 30 tablet, Rfl: 11   empagliflozin (JARDIANCE) 10 MG TABS tablet, Take 1 tablet (10 mg total) by mouth daily before breakfast., Disp: 30 tablet, Rfl: 11   ergocalciferol (VITAMIN D2) 1.25 MG (50000 UT) capsule, Take 1 capsule by mouth once a week., Disp: , Rfl:    gabapentin (NEURONTIN) 300 MG capsule, TAKE 1 CAPSULE BY MOUTH THREE TIMES A DAY, Disp: 90 capsule, Rfl: 2   glucose blood test strip, Use as instructed to check glucose BID, Disp: 100 each, Rfl: 12   Lancets (ONETOUCH ULTRASOFT) lancets, Use as instructed to check glucose BID, Disp: 100 each, Rfl: 12   metFORMIN (GLUCOPHAGE) 500 MG tablet, Take 2 tablets (1,000 mg total) by mouth 2 (two) times daily with a meal., Disp: 360 tablet, Rfl: 3   rOPINIRole (REQUIP) 0.25 MG tablet, Take 0.25 mg by mouth 3 (three) times daily. (Patient not taking: Reported on 06/01/2020), Disp: , Rfl:    telmisartan (MICARDIS) 40 MG tablet, TAKE 1 TABLET BY  MOUTH EVERYDAY AT BEDTIME, Disp: 90 tablet, Rfl: 1   vitamin B-12 (CYANOCOBALAMIN) 1000 MCG tablet, Take 1,000 mcg by mouth daily., Disp: , Rfl:    Review of Systems  Constitutional:  Negative for chills and fever.  HENT:  Negative for congestion and sore throat.   Eyes:  Negative for photophobia.  Respiratory:  Negative for cough, shortness of breath and wheezing.   Cardiovascular:  Negative for chest pain, palpitations and leg swelling.  Gastrointestinal:  Negative for abdominal  pain, blood in stool, constipation, diarrhea, nausea and vomiting.  Genitourinary:  Negative for dysuria, flank pain and hematuria.  Musculoskeletal:  Negative for back pain and myalgias.  Skin:  Negative for color change and rash.  Neurological:  Negative for dizziness, weakness and headaches.  Hematological:  Does not bruise/bleed easily.  Psychiatric/Behavioral:  Negative for agitation, behavioral problems, confusion, decreased concentration, dysphoric mood, hallucinations and suicidal ideas. The patient is not nervous/anxious and is not hyperactive.       Objective:   Physical Exam Constitutional:      Appearance: He is well-developed.  HENT:     Head: Normocephalic and atraumatic.  Eyes:     Conjunctiva/sclera: Conjunctivae normal.  Cardiovascular:     Rate and Rhythm: Normal rate and regular rhythm.  Pulmonary:     Effort: Pulmonary effort is normal. No respiratory distress.     Breath sounds: No wheezing.  Abdominal:     General: There is no distension.     Palpations: Abdomen is soft.  Musculoskeletal:        General: No tenderness. Normal range of motion.     Cervical back: Normal range of motion and neck supple.  Skin:    General: Skin is warm and dry.     Coloration: Skin is not pale.     Findings: No erythema or rash.  Neurological:     General: No focal deficit present.     Mental Status: He is alert and oriented to person, place, and time.  Psychiatric:        Mood and Affect:  Mood normal.        Behavior: Behavior normal.        Thought Content: Thought content normal.        Judgment: Judgment normal.          Assessment & Plan:  HIV: Prezista with Genvoya for now but consideration for the regimens that are mentioned he return to clinic in 1 years time.  I mentioned the idea of him filling medications through our specialty pharmacy at [facility] but he prefer to have them filled at his pharmacy and [facility]     HTN: followed with Dr. Derrel Nip  There were no vitals filed for this visit.  Statin myopathy: Resolved  Hyperlipidemia: at goal    Syphilis: titers low NCS  DM managed by Dr.[name]  Asplenia:  need to makes sure he is up to date on vaccinations. He has had 4 vaccines vs COVID  I spent more than 30 minutes with the patient including greater than 50% of time in face to face counseling of the patient personally reviewing radiographs, along with pertinent laboratory microbiological data review of medical records and in coordination of his care.

## 2020-07-02 ENCOUNTER — Telehealth: Payer: Self-pay | Admitting: Internal Medicine

## 2020-07-02 NOTE — Telephone Encounter (Signed)
Patient called and wanted to let Dr Derrel Nip know that his leg camps started up last night, not as bad as last time. Patient stated at last appointment with Dr Derrel Nip she said she could write a prescription for the leg cramps, he would like some medication.

## 2020-07-06 NOTE — Telephone Encounter (Signed)
Spoke with pt and informed him of the OTC options that Dr. Derrel Nip would like for him to try first. Pt gave a verbal understanding.

## 2020-07-06 NOTE — Telephone Encounter (Signed)
Muscle cramps are an on going issue for pt. At last appointment they had mostly resolved after stopping the statin medication. Pt has called back and stated that they have returned and would like to have the medication called in that was spoke about during his last office visit.

## 2020-07-08 ENCOUNTER — Ambulatory Visit (INDEPENDENT_AMBULATORY_CARE_PROVIDER_SITE_OTHER): Payer: Medicare Other | Admitting: Pharmacist

## 2020-07-08 DIAGNOSIS — E1129 Type 2 diabetes mellitus with other diabetic kidney complication: Secondary | ICD-10-CM | POA: Diagnosis not present

## 2020-07-08 DIAGNOSIS — N182 Chronic kidney disease, stage 2 (mild): Secondary | ICD-10-CM | POA: Diagnosis not present

## 2020-07-08 DIAGNOSIS — E7800 Pure hypercholesterolemia, unspecified: Secondary | ICD-10-CM

## 2020-07-08 DIAGNOSIS — B20 Human immunodeficiency virus [HIV] disease: Secondary | ICD-10-CM

## 2020-07-08 DIAGNOSIS — R809 Proteinuria, unspecified: Secondary | ICD-10-CM

## 2020-07-08 MED ORDER — METFORMIN HCL 500 MG PO TABS: ORAL_TABLET | ORAL | 3 refills | Status: DC

## 2020-07-08 NOTE — Chronic Care Management (AMB) (Signed)
Chronic Care Management Pharmacy Note  07/08/2020 Name:  Adam Becker MRN:  [id number] DOB:  [birth year]  Subjective: Adam Becker is an 65 y.o. year old male who is a primary patient of [name], [name], MD.  The CCM team was consulted for assistance with disease management and care coordination needs.    Engaged with patient by telephone for follow up visit in response to provider referral for pharmacy case management and/or care coordination services.   Consent to Services:  The patient was given information about Chronic Care Management services, agreed to services, and gave verbal consent prior to initiation of services.  Please see initial visit note for detailed documentation.   Patient Care Team: [name], MD as PCP - General (Internal Medicine) [name], [name], MD as PCP - Infectious Diseases (Infectious Diseases) De Hollingshead, RPH-CPP (Pharmacist)  Recent office visits: 5/10 - PCP visit -continue current regimen at this time  Recent consult visits: 6/9 - [name] ID- continue current regimen   Hospital visits: None in previous 6 months  Objective:  Lab Results  Component Value Date   CREATININE 1.31 (H) 06/10/2020   CREATININE 1.39 04/29/2020   CREATININE 1.38 01/29/2020    Lab Results  Component Value Date   HGBA1C 12.8 (H) 04/29/2020   Last diabetic Eye exam:  Lab Results  Component Value Date/Time   HMDIABEYEEXA No Retinopathy 05/20/2020 12:00 AM    Last diabetic Foot exam: No results found for: HMDIABFOOTEX      Component Value Date/Time   CHOL 226 (H) 04/29/2020 0854   TRIG (H) 04/29/2020 0854    497.0 Triglyceride is over 400; calculations on Lipids are invalid.   HDL 38.10 (L) 04/29/2020 0854   CHOLHDL 6 04/29/2020 0854   VLDL 27.0 05/29/2019 0908   LDLCALC 119 (H) 09/09/2019 0917   LDLDIRECT 89.0 04/29/2020 0854    Hepatic Function Latest Ref Rng & Units 06/10/2020 04/29/2020 02/05/2020  Total Protein 6.1 - 8.1  g/dL 8.1 8.3 7.9  Albumin 3.5 - 5.2 g/dL - 4.5 4.4  AST 10 - 35 U/L _0 ALT 9 - 46 U/L 27 38 37  Alk Phosphatase 39 - 117 U/L - 69 52  Total Bilirubin 0.2 - 1.2 mg/dL 0.5 0.5 0.4  Bilirubin, Direct 0.0 - 0.3 mg/dL - - 0.1    Lab Results  Component Value Date/Time   TSH 1.06 04/29/2020 08:54 AM   TSH 1.43 03/11/2019 08:47 AM   FREET4 0.74 01/10/2018 12:05 PM    CBC Latest Ref Rng & Units 06/10/2020 01/29/2020 09/09/2019  WBC 3.8 - 10.8 Thousand/uL 6.6 5.6 5.4  Hemoglobin 13.2 - 17.1 g/dL 14.1 14.0 13.5  Hematocrit 38.5 - 50.0 % 40.6 41.0 40.4  Platelets 140 - 400 Thousand/uL 481(H) 344.0 365    No results found for: VD25OH  Clinical ASCVD: No  The 10-year ASCVD risk score Mikey Bussing DC Jr., et al., 2013) is: 25.1%   Values used to calculate the score:     Age: 80 years     Sex: Male     Is Non-Hispanic African American: Yes     Diabetic: Yes     Tobacco smoker: No     Systolic Blood Pressure: 814 mmHg     Is BP treated: Yes     HDL Cholesterol: 38.1 mg/dL     Total Cholesterol: 226 mg/dL      Social History   Tobacco Use  Smoking Status  Never  Smokeless Tobacco Never   BP Readings from Last 3 Encounters:  06/01/20 116/76  05/06/20 135/79  04/29/20 132/86   Pulse Readings from Last 3 Encounters:  06/01/20 69  05/06/20 78  04/29/20 72   Wt Readings from Last 3 Encounters:  07/01/20 227 lb 6.4 oz (103.1 kg)  06/01/20 224 lb 9.6 oz (101.9 kg)  04/29/20 230 lb 3.2 oz (104.4 kg)    Assessment: Review of patient past medical history, allergies, medications, health status, including review of consultants reports, laboratory and other test data, was performed as part of comprehensive evaluation and provision of chronic care management services.   SDOH:  (Social Determinants of Health) assessments and interventions performed:    CCM Care Plan  Allergies  Allergen Reactions   Pravastatin Other (See Comments)    rhabdomyolysis    Medications Reviewed Today      Reviewed by De Hollingshead, RPH-CPP (Pharmacist) on 07/08/20 at 1510  Med List Status: <None>   Medication Order Taking? Sig Documenting Provider Last Dose Status Informant  allopurinol (ZYLOPRIM) 100 MG tablet [id number] Yes TAKE 1 TABLET BY MOUTH EVERY DAY [name], MD Taking Active   ALPRAZolam Duanne Moron) 0.25 MG tablet [id number] No Take 1 tablet (0.25 mg total) by mouth at bedtime as needed for anxiety.  Patient not taking: Reported on 07/08/2020   [name], MD Not Taking Active   aspirin EC 81 MG tablet [id number] Yes Take 1 tablet (81 mg total) by mouth daily. [name], FNP Taking Active   blood glucose meter kit and supplies KIT [id number]  Dispense based on patient and insurance preference. Use up to four times daily as directed. [name], MD  Active   cholecalciferol (VITAMIN D3) 25 MCG (1000 UNIT) tablet [id number] Yes Take 1,000 Units by mouth daily. [provider] Taking Active   darunavir (PREZISTA) 800 MG tablet [id number] Yes Take 1 tablet (800 mg total) by mouth daily. [name], [name], MD Taking Active   elvitegravir-cobicistat-emtricitabine-tenofovir (GENVOYA) 150-150-200-10 MG TABS tablet [id number] Yes TAKE 1 TABLET BY MOUTH EVERY DAY WITH BREAKFAST [name], [name], MD Taking Active   empagliflozin (JARDIANCE) 10 MG TABS tablet [id number] Yes Take 1 tablet (10 mg total) by mouth daily before breakfast. [name], MD Taking Active   gabapentin (NEURONTIN) 300 MG capsule [id number] Yes TAKE 1 CAPSULE BY MOUTH THREE TIMES A DAY [name], MD Taking Active            Med Note Nat Christen Jul 08, 2020  3:10 PM) 600 mg TID  glucose blood test strip [id number]  Use as instructed to check glucose BID [name], MD  Active   Lancets [facility] ULTRASOFT) lancets [id number]  Use as instructed to check glucose BID [name], MD  Active   metFORMIN (GLUCOPHAGE) 500 MG tablet [id number] Yes Take 2 tablets (1,000  mg total) by mouth 2 (two) times daily with a meal. [name], MD Taking Active            Med Note De Hollingshead   Tue May 25, 2020  8:43 AM) Taking 1 QAM, 2 QPM  rOPINIRole (REQUIP) 0.25 MG tablet [id number] Yes Take 0.25 mg by mouth 3 (three) times daily. [provider] Taking Active   telmisartan (MICARDIS) 40 MG tablet [id number] Yes TAKE 1 TABLET BY MOUTH EVERYDAY AT BEDTIME [name], MD Taking Active   vitamin  B-12 (CYANOCOBALAMIN) 1000 MCG tablet [id number] Yes Take 1,000 mcg by mouth daily. 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   TIA (transient ischemic attack) 08/18/2017   Need for immunization against influenza 10/10/2016   Prostate cancer screening 10/10/2016   Asplenia 11/11/2014   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   Routine adult health maintenance 04/23/2014   Allergic rhinitis due to allergen 04/23/2014   Chronic kidney disease (CKD), 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   History of gout 09/20/2012   Hyperlipidemia 11/01/2009   NEUROSYPHILIS 03/11/2009   PARESTHESIA, HANDS 06/18/2008   LATE SYPHILIS, LATENT 10/30/2007   ERECTILE DYSFUNCTION, MILD 12/31/2006   Human immunodeficiency virus (HIV) disease ([place]) 04/16/2006    Immunization History  Administered Date(s) Administered   H1N1 01/02/2008   Hepatitis A 12/31/2006, 07/15/2007   Hepatitis B 03/26/2001, 05/06/2001, 10/14/2001   Influenza Inj Mdck Quad Pf 10/12/2017   Influenza Split 11/15/2010, 12/07/2011   Influenza Whole 10/02/2005, 10/15/2006,  11/26/2008, 11/01/2009   Influenza,inj,Quad PF,6+ Mos 10/07/2012, 11/05/2013, 11/11/2014, 10/12/2015, 10/10/2016, 10/24/2019   Influenza-Unspecified 09/28/2018   Meningococcal Conjugate 10/09/2019   Meningococcal Polysaccharide 10/12/2015   PFIZER(Purple Top)SARS-COV-2 Vaccination 03/28/2019, 04/18/2019, 09/23/2019   Pneumococcal Conjugate-13 06/21/2011   Pneumococcal Polysaccharide-23 09/23/2005, 10/02/2005, 02/29/2012, 09/23/2019    Conditions to be addressed/monitored: HTN, HLD, and DMII  Care Plan : Medication Management  Updates made by De Hollingshead, RPH-CPP since 07/08/2020 12:00 AM     Problem: Diabetes, HTN, HLD      Long-Range Goal: Disease Progression Prevention   Start Date: 05/13/2020  This Visit's Progress: On track  Recent Progress: On track  Priority: High  Note:   Current Barriers:  Unable to achieve control of diabetes   Pharmacist Clinical Goal(s):  Over the next 90 days, patient will achieve control of diabetes as evidenced by A1c through collaboration with PharmD and provider.   Interventions: 1:1 collaboration with [name], MD regarding development and update of comprehensive plan of care as evidenced by provider attestation and co-signature Inter-disciplinary care team collaboration (see longitudinal plan of care) Comprehensive medication review performed; medication list updated in electronic medical record  Health Maintenance: Due for pneumonia, td, shingrix vaccinations. Will discuss moving forward.   Diabetes: Uncontrolled; current treatment: metformin 500 mg QAM, 1000 mg QPM, Jardiance 10 mg daily Current glucose readings: fasting glucose: 90-110s, evening readings: 100-110s  Denies low blood glucose episodes/symptoms. Current exercise: tries to walk on treadmill in the morning, has incorporated weights every other day Praised for improved glucose control. Reviewed goal A1c, goal fasting, goal 2 hour post prandial glucose.    Recommended to continue current regimen at this time.  Updated metformin script to reflect her current usage and sent to pharmacy.   Hypertension: Controlled per home readings; current treatment: telmisartan 40 mg daily Current home readings: 110-120s/80s Continue current regimen at this time.   Hyperlipidemia and ASCVD risk reduction: Uncontrolled; current treatment: none Antiplatelet regimen: aspirin 81 mg daily Previous regimen: muscle aches w/ pravastatin w/ CK elevation DDI with ART therapy and pravastatin.  Also DDI with rosuvastatin. Could consider very low dose rosuvastatin 5 mg daily with close monitoring of CK/symptoms, however, if patient's baseline lipid are near goal of LDL <70, may be appropriate to initiate ezetimibe instead, as no drug interaction. Recommend recheck of lipid panel with upcoming lab work  HIV: Managed by ID; current regimen: Prezista 800 mg daily, Genvoya 150/150/200/10 mg  Recommended to continue current regimen at this time along with ID collaboration.   Peripheral Neuropathy; Moderately well managed; current regimen: gabapentin 300 mg TID, though reports he is taking 600 mg TID; ropinirole 0.25 mg TID, but patient notes he has not received the order yet. Follows w/ neurology Dr. Manuella Ghazi Ferritin at goal.  Previously recommended to continue current regimen at this time along with neurology collaboration.   Gout: Controlled; current regimen: allopurinol 100 mg daily Last uric acid at goal Previously recommended to continue current regimen at this time  Patient Goals/Self-Care Activities Over the next 90 days, patient will:  - take medications as prescribed check glucose twice daily, document, and provide at future appointments engage in dietary modifications by reducing carbohydrate portion sizes  Follow Up Plan: Telephone follow up appointment with care management team member scheduled for: ~ 12 weeks     Medication Assistance: None required.   Patient affirms current coverage meets needs.  Patient's [id number] [id number]: [phone]: [phone]   Follow Up:  Patient agrees to Care Plan and Follow-up.  Plan: Telephone follow up appointment with care management team member scheduled for:  12 weeks  Catie TDarnelle Maffucci PharmD, [place] CTorontoClinical Pharmacist [facility] [phone]

## 2020-07-08 NOTE — Patient Instructions (Signed)
Visit Information  PATIENT GOALS:  Goals Addressed               This Visit's Progress     Patient Stated     Medication Management (pt-stated)        Patient Goals/Self-Care Activities Over the next 90 days, patient will:  - take medications as prescribed check glucose twice daily, document, and provide at future appointments engage in dietary modifications by reducing carbohydrate portion sizes          Patient verbalizes understanding of instructions provided today and agrees to view in [place].   Plan: Telephone follow up appointment with care management team member scheduled for:  12 weeks  Catie Darnelle Maffucci, PharmD, [place], [place] Clinical Pharmacist [employer] at [employer] [phone]

## 2020-07-14 ENCOUNTER — Ambulatory Visit (INDEPENDENT_AMBULATORY_CARE_PROVIDER_SITE_OTHER): Payer: Medicare Other | Admitting: Internal Medicine

## 2020-07-14 ENCOUNTER — Telehealth: Payer: Self-pay | Admitting: Internal Medicine

## 2020-07-14 ENCOUNTER — Encounter: Payer: Self-pay | Admitting: Internal Medicine

## 2020-07-14 ENCOUNTER — Telehealth: Payer: Self-pay

## 2020-07-14 ENCOUNTER — Other Ambulatory Visit: Payer: Self-pay

## 2020-07-14 VITALS — BP 120/74 | HR 80 | Temp 96.6°F | Resp 14 | Ht 71.0 in | Wt 228.0 lb

## 2020-07-14 DIAGNOSIS — G629 Polyneuropathy, unspecified: Secondary | ICD-10-CM | POA: Diagnosis not present

## 2020-07-14 DIAGNOSIS — E1159 Type 2 diabetes mellitus with other circulatory complications: Secondary | ICD-10-CM | POA: Diagnosis not present

## 2020-07-14 DIAGNOSIS — E669 Obesity, unspecified: Secondary | ICD-10-CM | POA: Diagnosis not present

## 2020-07-14 DIAGNOSIS — M4722 Other spondylosis with radiculopathy, cervical region: Secondary | ICD-10-CM

## 2020-07-14 DIAGNOSIS — E1169 Type 2 diabetes mellitus with other specified complication: Secondary | ICD-10-CM | POA: Diagnosis not present

## 2020-07-14 DIAGNOSIS — I152 Hypertension secondary to endocrine disorders: Secondary | ICD-10-CM

## 2020-07-14 MED ORDER — PREDNISONE 10 MG PO TABS
ORAL_TABLET | ORAL | 0 refills | Status: DC
Start: 2020-07-14 — End: 2020-10-05

## 2020-07-14 MED ORDER — TIZANIDINE HCL 4 MG PO TABS: 4.0000 mg | ORAL_TABLET | Freq: Four times a day (QID) | ORAL | 0 refills | Status: DC | PRN

## 2020-07-14 MED ORDER — GABAPENTIN 300 MG PO CAPS: 600.0000 mg | ORAL_CAPSULE | Freq: Three times a day (TID) | ORAL | 5 refills | Status: DC

## 2020-07-14 NOTE — Telephone Encounter (Signed)
Pt called and stated that he has been having neck pain and pressure radiating into his right shoulder. This started last Saturday. It now hurts to move his head in any direction. Transferred to Apolonio Schneiders at [facility] to triage and let her know that there are no appts avail at our location.

## 2020-07-14 NOTE — Telephone Encounter (Signed)
Patient has appointment at 1400 today with PCP.

## 2020-07-14 NOTE — Patient Instructions (Addendum)
Your pain is coming from the cervical spondylosis caused by degenerative changes in your cervical spine   I recommend we try a Prednisone taper for the next 6 days.  NO MORE ADVIL  You CAN add up to 2000 mg of acetominophen (tylenol) every day safely  In divided doses (500 mg every 6 hours  Or 1000 mg every 12 hours.)   CONTINUE GABAPENTIN  600 MG EVERY 8 HOURS AS YOU ARE DOING    WE CAN ADD A MUSCLE RELAXER CALLED   TIZANIDINE  WHICH CAN BE TAKEN EVERY 6 HOURS ,  OR JUST IN THE EVENING IF IT MAKES YOU SLEEPY   USE MORE NECK SUPPORT WHENEVER YOU ARE RECLINING OR SLEEPING   RETURN FOR DIABETES LABS ON July 8 AND FOLLOW UP AFTER THAT

## 2020-07-14 NOTE — Progress Notes (Signed)
Subjective:  Patient ID: Adam Becker, male    DOB: 1955-09-13  Age: 65 y.o. MRN: [id number]  CC: The primary encounter diagnosis was Obesity, diabetes, and hypertension syndrome ([place]). Diagnoses of Neuropathy and Cervical spondylosis with radiculopathy were also pertinent to this visit.  HPI Adam Becker presents for evaluation of neck pain   This visit occurred during the SARS-CoV-2 public health emergency.  Safety protocols were in place, including screening questions prior to the visit, additional usage of staff PPE, and extensive cleaning of exam room while observing appropriate contact time as indicated for disinfecting solutions.   The neck pain is intermittent for several months and localizes to the occipital area,  aggravated by sitting in the recliner or watching TV,  However for the past 5 days it has been more severe.  Temporarily relieved with motrin. Over the last day The pain  has started radiate to the right shoulder t . Rates the pain on a scale of 1 to 10 as a 7 on Saturday .  There have been no fevers,  no headaches.  Several weeks ago felt weak in the arms while applying lotion to legs,  but it was transient.  Not dropping things . No tremor .    Retired from [employer] work of over 30 years.   Takes 2 gabapentin 3 times daily  Outpatient Medications Prior to Visit  Medication Sig Dispense Refill   allopurinol (ZYLOPRIM) 100 MG tablet TAKE 1 TABLET BY MOUTH EVERY DAY 90 tablet 1   aspirin EC 81 MG tablet Take 1 tablet (81 mg total) by mouth daily. 90 tablet 3   blood glucose meter kit and supplies KIT Dispense based on patient and insurance preference. Use up to four times daily as directed. 1 each 11   cholecalciferol (VITAMIN D3) 25 MCG (1000 UNIT) tablet Take 1,000 Units by mouth daily.     darunavir (PREZISTA) 800 MG tablet Take 1 tablet (800 mg total) by mouth daily. 60 tablet 11   elvitegravir-cobicistat-emtricitabine-tenofovir (GENVOYA) 150-150-200-10 MG TABS  tablet TAKE 1 TABLET BY MOUTH EVERY DAY WITH BREAKFAST 30 tablet 11   empagliflozin (JARDIANCE) 10 MG TABS tablet Take 1 tablet (10 mg total) by mouth daily before breakfast. 30 tablet 11   glucose blood test strip Use as instructed to check glucose BID 100 each 12   Lancets (ONETOUCH ULTRASOFT) lancets Use as instructed to check glucose BID 100 each 12   metFORMIN (GLUCOPHAGE) 500 MG tablet Take 1 tablet QAM and 2 tablets QPM 270 tablet 3   rOPINIRole (REQUIP) 1 MG tablet Take 1 mg by mouth 4 (four) times daily.     telmisartan (MICARDIS) 40 MG tablet TAKE 1 TABLET BY MOUTH EVERYDAY AT BEDTIME 90 tablet 1   vitamin B-12 (CYANOCOBALAMIN) 1000 MCG tablet Take 1,000 mcg by mouth daily.     gabapentin (NEURONTIN) 300 MG capsule TAKE 1 CAPSULE BY MOUTH THREE TIMES A DAY 90 capsule 2   ALPRAZolam (XANAX) 0.25 MG tablet Take 1 tablet (0.25 mg total) by mouth at bedtime as needed for anxiety. (Patient not taking: No sig reported) 15 tablet 0   rOPINIRole (REQUIP) 0.25 MG tablet Take 0.25 mg by mouth 3 (three) times daily. (Patient not taking: Reported on 07/14/2020)     No facility-administered medications prior to visit.    Review of Systems;  Patient denies headache, fevers, malaise, unintentional weight loss, skin rash, eye pain, sinus congestion and sinus pain, sore throat, dysphagia,  hemoptysis ,  cough, dyspnea, wheezing, chest pain, palpitations, orthopnea, edema, abdominal pain, nausea, melena, diarrhea, constipation, flank pain, dysuria, hematuria, urinary  Frequency, nocturia, numbness, tingling, seizures,  Focal weakness, Loss of consciousness,  Tremor, insomnia, depression, anxiety, and suicidal ideation.      Objective:  BP 120/74 (BP Location: Left Arm, Patient Position: Sitting, Cuff Size: Large)   Pulse 80   Temp (!) 96.6 F (35.9 C) (Temporal)   Resp 14   Ht 5' 11" (1.803 m)   Wt 228 lb (103.4 kg)   SpO2 97%   BMI 31.80 kg/m   BP Readings from Last 3 Encounters:  07/14/20  120/74  06/01/20 116/76  05/06/20 135/79    Wt Readings from Last 3 Encounters:  07/14/20 228 lb (103.4 kg)  07/01/20 227 lb 6.4 oz (103.1 kg)  06/01/20 224 lb 9.6 oz (101.9 kg)    General appearance: alert, cooperative and appears stated age Ears: normal TM's and external ear canals both ears Throat: lips, mucosa, and tongue normal; teeth and gums normal Neck: no adenopathy, no carotid bruit, supple, symmetrical, trachea midline and thyroid not enlarged, symmetric, no tenderness/mass/nodules Back: symmetric, no curvature. ROM normal. No CVA tenderness. Lungs: clear to auscultation bilaterally Heart: regular rate and rhythm, S1, S2 normal, no murmur, click, rub or gallop Abdomen: soft, non-tender; bowel sounds normal; no masses,  no organomegaly Pulses: 2+ and symmetric Skin: Skin color, texture, turgor normal. No rashes or lesions Lymph nodes: Cervical, supraclavicular, and axillary nodes normal. Neuro: CNs 2-12 intact. DTRs 2+/4 in biceps, brachioradialis, patellars and achilles. Muscle strength 5/5 in upper and lower exremities. Fine resting tremor bilaterally both hands cerebellar function normal. Romberg negative.  No pronator drift.   Gait normal.    Lab Results  Component Value Date   HGBA1C 12.8 (H) 04/29/2020   HGBA1C 6.4 05/29/2019   HGBA1C 6.7 (H) 03/11/2019    Lab Results  Component Value Date   CREATININE 1.31 (H) 06/10/2020   CREATININE 1.39 04/29/2020   CREATININE 1.38 01/29/2020    Lab Results  Component Value Date   WBC 6.6 06/10/2020   HGB 14.1 06/10/2020   HCT 40.6 06/10/2020   PLT 481 (H) 06/10/2020   GLUCOSE 95 06/10/2020   CHOL 226 (H) 04/29/2020   TRIG (H) 04/29/2020    497.0 Triglyceride is over 400; calculations on Lipids are invalid.   HDL 38.10 (L) 04/29/2020   LDLDIRECT 89.0 04/29/2020   LDLCALC 119 (H) 09/09/2019   ALT 27 06/10/2020   AST 29 06/10/2020   NA 138 06/10/2020   K 4.3 06/10/2020   CL 102 06/10/2020   CREATININE 1.31 (H)  06/10/2020   BUN 18 06/10/2020   CO2 27 06/10/2020   TSH 1.06 04/29/2020   PSA 0.45 04/29/2020   HGBA1C 12.8 (H) 04/29/2020   MICROALBUR 23.4 (H) 04/29/2020    No results found.  Assessment & Plan:   Problem List Items Addressed This Visit       Unprioritized   Cervical spondylosis with radiculopathy    xam is normal but he has symptoms of referred pain cervical disk disease noted on prior imaging.  Given escalation of symptoms over the last several days .  Will stop NSAID and use a prednisone taper , continue gabapentin and add MR and neck support with cervical pillow        Relevant Medications   rOPINIRole (REQUIP) 1 MG tablet   predniSONE (DELTASONE) 10 MG tablet   gabapentin (NEURONTIN) 300 MG capsule  tiZANidine (ZANAFLEX) 4 MG tablet   Neuropathy   Relevant Medications   gabapentin (NEURONTIN) 300 MG capsule   Obesity, diabetes, and hypertension syndrome (HCC) - Primary   Relevant Orders   Hemoglobin A1c   Comprehensive metabolic panel    I spent 30 mintutes dedicated to the care of this patient on the date of this encounter to include pre-visit review of his medical history,  Face-to-face time with the patient , and post visit ordering of t therapeutics for nis neck pain and radiculopathy.   I have discontinued Marjory Lies. Bare's ALPRAZolam. I have also changed his gabapentin. Additionally, I am having him start on predniSONE and tiZANidine. Lastly, I am having him maintain his aspirin EC, vitamin B-12, telmisartan, blood glucose meter kit and supplies, glucose blood, onetouch ultrasoft, allopurinol, empagliflozin, Genvoya, darunavir, cholecalciferol, metFORMIN, and rOPINIRole.  Meds ordered this encounter  Medications   predniSONE (DELTASONE) 10 MG tablet    Sig: 6 tablets on Day 1 , then reduce by 1 tablet daily until gone    Dispense:  21 tablet    Refill:  0   gabapentin (NEURONTIN) 300 MG capsule    Sig: Take 2 capsules (600 mg total) by mouth 3 (three)  times daily.    Dispense:  180 capsule    Refill:  5   tiZANidine (ZANAFLEX) 4 MG tablet    Sig: Take 1 tablet (4 mg total) by mouth every 6 (six) hours as needed for muscle spasms.    Dispense:  30 tablet    Refill:  0    Medications Discontinued During This Encounter  Medication Reason   rOPINIRole (REQUIP) 0.25 MG tablet Change in therapy   ALPRAZolam (XANAX) 0.25 MG tablet    gabapentin (NEURONTIN) 300 MG capsule     Follow-up: No follow-ups on file.   [name], MD

## 2020-07-17 DIAGNOSIS — M4722 Other spondylosis with radiculopathy, cervical region: Secondary | ICD-10-CM | POA: Insufficient documentation

## 2020-07-17 NOTE — Assessment & Plan Note (Signed)
xam is normal but he has symptoms of referred pain cervical disk disease noted on prior imaging.  Given escalation of symptoms over the last several days .  Will stop NSAID and use a prednisone taper , continue gabapentin and add MR and neck support with cervical pillow

## 2020-08-01 DIAGNOSIS — Z20822 Contact with and (suspected) exposure to covid-19: Secondary | ICD-10-CM | POA: Diagnosis not present

## 2020-08-03 ENCOUNTER — Other Ambulatory Visit (INDEPENDENT_AMBULATORY_CARE_PROVIDER_SITE_OTHER): Payer: Medicare Other

## 2020-08-03 ENCOUNTER — Other Ambulatory Visit: Payer: Self-pay

## 2020-08-03 DIAGNOSIS — E1159 Type 2 diabetes mellitus with other circulatory complications: Secondary | ICD-10-CM | POA: Diagnosis not present

## 2020-08-03 DIAGNOSIS — I152 Hypertension secondary to endocrine disorders: Secondary | ICD-10-CM

## 2020-08-03 DIAGNOSIS — E1169 Type 2 diabetes mellitus with other specified complication: Secondary | ICD-10-CM | POA: Diagnosis not present

## 2020-08-03 DIAGNOSIS — E669 Obesity, unspecified: Secondary | ICD-10-CM

## 2020-08-03 DIAGNOSIS — R748 Abnormal levels of other serum enzymes: Secondary | ICD-10-CM

## 2020-08-03 LAB — COMPREHENSIVE METABOLIC PANEL
ALT: 26 U/L (ref 0–53)
AST: 24 U/L (ref 0–37)
Albumin: 4.5 g/dL (ref 3.5–5.2)
Alkaline Phosphatase: 49 U/L (ref 39–117)
BUN: 10 mg/dL (ref 6–23)
CO2: 29 mEq/L (ref 19–32)
Calcium: 10.1 mg/dL (ref 8.4–10.5)
Chloride: 101 mEq/L (ref 96–112)
Creatinine, Ser: 1.2 mg/dL (ref 0.40–1.50)
GFR: 64.21 mL/min (ref >60.00)
Glucose, Bld: 104 mg/dL — ABNORMAL HIGH (ref 70–99)
Potassium: 4.3 mEq/L (ref 3.5–5.1)
Sodium: 138 mEq/L (ref 135–145)
Total Bilirubin: 0.5 mg/dL (ref 0.2–1.2)
Total Protein: 7.9 g/dL (ref 6.0–8.3)

## 2020-08-03 LAB — CK: Total CK: 268 U/L — ABNORMAL HIGH (ref 7–232)

## 2020-08-03 LAB — SEDIMENTATION RATE: Sed Rate: 25 mm/hr — ABNORMAL HIGH (ref 0–20)

## 2020-08-03 LAB — HEMOGLOBIN A1C: Hgb A1c MFr Bld: 6.6 % — ABNORMAL HIGH (ref 4.6–6.5)

## 2020-08-05 ENCOUNTER — Telehealth: Payer: Self-pay

## 2020-08-05 LAB — ANA: Anti Nuclear Antibody (ANA): NEGATIVE

## 2020-08-05 NOTE — Telephone Encounter (Signed)
LMTCB in regards to lab results.  

## 2020-08-11 ENCOUNTER — Ambulatory Visit: Payer: Medicare Other

## 2020-08-12 ENCOUNTER — Ambulatory Visit (INDEPENDENT_AMBULATORY_CARE_PROVIDER_SITE_OTHER): Payer: Medicare Other

## 2020-08-12 ENCOUNTER — Ambulatory Visit: Payer: Medicare Other | Admitting: Internal Medicine

## 2020-08-12 VITALS — Ht 71.0 in | Wt 228.0 lb

## 2020-08-12 DIAGNOSIS — Z0000 Encounter for general adult medical examination without abnormal findings: Secondary | ICD-10-CM | POA: Diagnosis not present

## 2020-08-12 NOTE — Patient Instructions (Addendum)
Adam Becker , Thank you for taking time to come for your Medicare Wellness Visit. I appreciate your ongoing commitment to your health goals. Please review the following plan we discussed and let me know if I can assist you in the future.   These are the goals we discussed:  Goals       Patient Stated     Medication Management (pt-stated)      Patient Goals/Self-Care Activities Over the next 90 days, patient will:  - take medications as prescribed check glucose twice daily, document, and provide at future appointments engage in dietary modifications by reducing carbohydrate portion sizes       Other     Weight goal 215lb      Low carb foods Stay hydrated Stay active and continue to walk for exercise         This is a list of the screening recommended for you and due dates:  Health Maintenance  Topic Date Due   Meningococcal B Vaccine (1 of 4 - Increased Risk Bexsero 2-dose series) 10/03/1966   Zoster (Shingles) Vaccine (1 of 2) 11/12/2020*   Colon Cancer Screening  08/12/2021*   Tetanus Vaccine  08/12/2021*   Flu Shot  08/23/2020   COVID-19 Vaccine (5 - Booster for Pfizer series) 10/11/2020   Hemoglobin A1C  02/03/2021   Eye exam for diabetics  05/20/2021   Complete foot exam   06/01/2021   Pneumococcal Vaccination (4 - PPSV23 or PCV20) 09/22/2024   Pneumococcal vaccine  Completed   Hepatitis C Screening: USPSTF Recommendation to screen - Ages 50-79 yo.  Completed   HIV Screening  Completed   HPV Vaccine  Aged Out  *Topic was postponed. The date shown is not the original due date.  Advanced directives: not yet completed  Conditions/risks identified: none new  Follow up in one year for your annual wellness visit   Preventive Care 40-64 Years, Male Preventive care refers to lifestyle choices and visits with your health care provider that can promote health and wellness. What does preventive care include? A yearly physical exam. This is also called an annual well  check. Dental exams once or twice a year. Routine eye exams. Ask your health care provider how often you should have your eyes checked. Personal lifestyle choices, including: Daily care of your teeth and gums. Regular physical activity. Eating a healthy diet. Avoiding tobacco and drug use. Limiting alcohol use. Practicing safe sex. Taking low-dose aspirin every day starting at age 89. What happens during an annual well check? The services and screenings done by your health care provider during your annual well check will depend on your age, overall health, lifestyle risk factors, and family history of disease. Counseling  Your health care provider may ask you questions about your: Alcohol use. Tobacco use. Drug use. Emotional well-being. Home and relationship well-being. Sexual activity. Eating habits. Work and work Statistician. Screening  You may have the following tests or measurements: Height, weight, and BMI. Blood pressure. Lipid and cholesterol levels. These may be checked every 5 years, or more frequently if you are over 75 years old. Skin check. Lung cancer screening. You may have this screening every year starting at age 57 if you have a 30-pack-year history of smoking and currently smoke or have quit within the past 15 years. Fecal occult blood test (FOBT) of the stool. You may have this test every year starting at age 55. Flexible sigmoidoscopy or colonoscopy. You may have a sigmoidoscopy every 5 years  or a colonoscopy every 10 years starting at age 63. Prostate cancer screening. Recommendations will vary depending on your family history and other risks. Hepatitis C blood test. Hepatitis B blood test. Sexually transmitted disease (STD) testing. Diabetes screening. This is done by checking your blood sugar (glucose) after you have not eaten for a while (fasting). You may have this done every 1-3 years. Discuss your test results, treatment options, and if necessary, the  need for more tests with your health care provider. Vaccines  Your health care provider may recommend certain vaccines, such as: Influenza vaccine. This is recommended every year. Tetanus, diphtheria, and acellular pertussis (Tdap, Td) vaccine. You may need a Td booster every 10 years. Zoster vaccine. You may need this after age 43. Pneumococcal 13-valent conjugate (PCV13) vaccine. You may need this if you have certain conditions and have not been vaccinated. Pneumococcal polysaccharide (PPSV23) vaccine. You may need one or two doses if you smoke cigarettes or if you have certain conditions. Talk to your health care provider about which screenings and vaccines you need and how often you need them. This information is not intended to replace advice given to you by your health care provider. Make sure you discuss any questions you have with your health care provider. Document Released: 02/05/2015 Document Revised: 09/29/2015 Document Reviewed: 11/10/2014 Elsevier Interactive Patient Education  2017 [place] Prevention in the Home Falls can cause injuries. They can happen to people of all ages. There are many things you can do to make your home safe and to help prevent falls. What can I do on the outside of my home? Regularly fix the edges of walkways and driveways and fix any cracks. Remove anything that might make you trip as you walk through a door, such as a raised step or threshold. Trim any bushes or trees on the path to your home. Use bright outdoor lighting. Clear any walking paths of anything that might make someone trip, such as rocks or tools. Regularly check to see if handrails are loose or broken. Make sure that both sides of any steps have handrails. Any raised decks and porches should have guardrails on the edges. Have any leaves, snow, or ice cleared regularly. Use sand or salt on walking paths during winter. Clean up any spills in your garage right away. This  includes oil or grease spills. What can I do in the bathroom? Use night lights. Install grab bars by the toilet and in the tub and shower. Do not use towel bars as grab bars. Use non-skid mats or decals in the tub or shower. If you need to sit down in the shower, use a plastic, non-slip stool. Keep the floor dry. Clean up any water that spills on the floor as soon as it happens. Remove soap buildup in the tub or shower regularly. Attach bath mats securely with double-sided non-slip rug tape. Do not have throw rugs and other things on the floor that can make you trip. What can I do in the bedroom? Use night lights. Make sure that you have a light by your bed that is easy to reach. Do not use any sheets or blankets that are too big for your bed. They should not hang down onto the floor. Have a firm chair that has side arms. You can use this for support while you get dressed. Do not have throw rugs and other things on the floor that can make you trip. What can I do in the  kitchen? Clean up any spills right away. Avoid walking on wet floors. Keep items that you use a lot in easy-to-reach places. If you need to reach something above you, use a strong step stool that has a grab bar. Keep electrical cords out of the way. Do not use floor polish or wax that makes floors slippery. If you must use wax, use non-skid floor wax. Do not have throw rugs and other things on the floor that can make you trip. What can I do with my stairs? Do not leave any items on the stairs. Make sure that there are handrails on both sides of the stairs and use them. Fix handrails that are broken or loose. Make sure that handrails are as long as the stairways. Check any carpeting to make sure that it is firmly attached to the stairs. Fix any carpet that is loose or worn. Avoid having throw rugs at the top or bottom of the stairs. If you do have throw rugs, attach them to the floor with carpet tape. Make sure that you  have a light switch at the top of the stairs and the bottom of the stairs. If you do not have them, ask someone to add them for you. What else can I do to help prevent falls? Wear shoes that: Do not have high heels. Have rubber bottoms. Are comfortable and fit you well. Are closed at the toe. Do not wear sandals. If you use a stepladder: Make sure that it is fully opened. Do not climb a closed stepladder. Make sure that both sides of the stepladder are locked into place. Ask someone to hold it for you, if possible. Clearly mark and make sure that you can see: Any grab bars or handrails. First and last steps. Where the edge of each step is. Use tools that help you move around (mobility aids) if they are needed. These include: Canes. Walkers. Scooters. Crutches. Turn on the lights when you go into a dark area. Replace any light bulbs as soon as they burn out. Set up your furniture so you have a clear path. Avoid moving your furniture around. If any of your floors are uneven, fix them. If there are any pets around you, be aware of where they are. Review your medicines with your doctor. Some medicines can make you feel dizzy. This can increase your chance of falling. Ask your doctor what other things that you can do to help prevent falls. This information is not intended to replace advice given to you by your health care provider. Make sure you discuss any questions you have with your health care provider. Document Released: 11/05/2008 Document Revised: 06/17/2015 Document Reviewed: 02/13/2014 Elsevier Interactive Patient Education  2017 [employer].

## 2020-08-12 NOTE — Progress Notes (Signed)
Subjective:   Adam Becker is a 65 y.o. male who presents for Medicare Annual/Subsequent preventive examination.  Review of Systems    No ROS.  Medicare Wellness Virtual Visit.  Visual/audio telehealth visit, UTA vital signs.   See social history for additional risk factors.   Cardiac Risk Factors include: diabetes mellitus;male gender;hypertension     Objective:    Today's Vitals   08/12/20 1623  Weight: 228 lb (103.4 kg)  Height: '5\' 11"'  (1.803 m)   Body mass index is 31.8 kg/m.  Advanced Directives 08/12/2020 08/11/2019 07/25/2019 02/27/2019 08/08/2018 08/18/2017 07/31/2017  Does Patient Have a Medical Advance Directive? No No No No No No No  Would patient like information on creating a medical advance directive? No - Patient declined No - Patient declined No - Patient declined No - Patient declined No - Patient declined No - Patient declined Yes (MAU/Ambulatory/Procedural Areas - Information given)    Current Medications (verified) Outpatient Encounter Medications as of 08/12/2020  Medication Sig   allopurinol (ZYLOPRIM) 100 MG tablet TAKE 1 TABLET BY MOUTH EVERY DAY   aspirin EC 81 MG tablet Take 1 tablet (81 mg total) by mouth daily.   blood glucose meter kit and supplies KIT Dispense based on patient and insurance preference. Use up to four times daily as directed.   cholecalciferol (VITAMIN D3) 25 MCG (1000 UNIT) tablet Take 1,000 Units by mouth daily.   darunavir (PREZISTA) 800 MG tablet Take 1 tablet (800 mg total) by mouth daily.   elvitegravir-cobicistat-emtricitabine-tenofovir (GENVOYA) 150-150-200-10 MG TABS tablet TAKE 1 TABLET BY MOUTH EVERY DAY WITH BREAKFAST   empagliflozin (JARDIANCE) 10 MG TABS tablet Take 1 tablet (10 mg total) by mouth daily before breakfast.   gabapentin (NEURONTIN) 300 MG capsule Take 2 capsules (600 mg total) by mouth 3 (three) times daily.   glucose blood test strip Use as instructed to check glucose BID   Lancets (ONETOUCH ULTRASOFT)  lancets Use as instructed to check glucose BID   metFORMIN (GLUCOPHAGE) 500 MG tablet Take 1 tablet QAM and 2 tablets QPM   predniSONE (DELTASONE) 10 MG tablet 6 tablets on Day 1 , then reduce by 1 tablet daily until gone   rOPINIRole (REQUIP) 1 MG tablet Take 1 mg by mouth 4 (four) times daily.   telmisartan (MICARDIS) 40 MG tablet TAKE 1 TABLET BY MOUTH EVERYDAY AT BEDTIME   tiZANidine (ZANAFLEX) 4 MG tablet Take 1 tablet (4 mg total) by mouth every 6 (six) hours as needed for muscle spasms.   vitamin B-12 (CYANOCOBALAMIN) 1000 MCG tablet Take 1,000 mcg by mouth daily.   No facility-administered encounter medications on file as of 08/12/2020.    Allergies (verified) Pravastatin   History: Past Medical History:  Diagnosis Date   Asplenia 11/11/2014   Chronic kidney disease Nov 2013   admission for [facility] cr 11, acidosis   HIV infection ([place])    Hyperglycemia 04/04/2016   Hyperlipidemia    Hypertension    Impaired fasting glucose 07/27/2015   Urethritis 02/11/2015   Past Surgical History:  Procedure Laterality Date   COLONOSCOPY     LIPOSUCTION HEAD / NECK  2001   posterior neck   SPLENECTOMY  2000   reason unclear "stopped working"   Family History  Problem Relation Age of Onset   Cancer Mother    Breast cancer Mother    Colon cancer Father    Birth defects Son        Breast    Social  History   Socioeconomic History   Marital status: Single    Spouse name: Not on file   Number of children: Not on file   Years of education: Not on file   Highest education level: Not on file  Occupational History   Not on file  Tobacco Use   Smoking status: Never   Smokeless tobacco: Never  Vaping Use   Vaping Use: Never used  Substance and Sexual Activity   Alcohol use: No   Drug use: No   Sexual activity: Not Currently    Comment: declined condoms  Other Topics Concern   Not on file  Social History Narrative   Independent baseline. Lives by himself.  Education 12th.   Children none.  Caffeine tea one cup 4 x week.   Social Determinants of Health   Financial Resource Strain: Low Risk    Difficulty of Paying Living Expenses: Not very hard  Food Insecurity: No Food Insecurity   Worried About Charity fundraiser in the Last Year: Never true   Ran Out of Food in the Last Year: Never true  Transportation Needs: No Transportation Needs   Lack of Transportation (Medical): No   Lack of Transportation (Non-Medical): No  Physical Activity: Sufficiently Active   Days of Exercise per Week: 7 days   Minutes of Exercise per Session: 30 min  Stress: No Stress Concern Present   Feeling of Stress : Not at all  Social Connections: Unknown   Frequency of Communication with Friends and Family: More than three times a week   Frequency of Social Gatherings with Friends and Family: More than three times a week   Attends Religious Services: More than 4 times per year   Active Member of [employer] or Organizations: Yes   Attends Music therapist: More than 4 times per year   Marital Status: Not on file    Tobacco Counseling Counseling given: Not Answered   Clinical Intake:  Pre-visit preparation completed: Yes        Diabetes: Yes  How often do you need to have someone help you when you read instructions, pamphlets, or other written materials from your doctor or pharmacy?: 1 - Never    Interpreter Needed?: No      Activities of Daily Living In your present state of health, do you have any difficulty performing the following activities: 08/12/2020  Hearing? N  Vision? N  Difficulty concentrating or making decisions? N  Walking or climbing stairs? N  Dressing or bathing? N  Doing errands, shopping? N  Preparing Food and eating ? N  Using the Toilet? N  In the past six months, have you accidently leaked urine? N  Do you have problems with loss of bowel control? N  Managing your Medications? N  Managing your Finances? N  Housekeeping or  managing your Housekeeping? N  Some recent data might be hidden    Patient Care Team: [name], MD as PCP - General (Internal Medicine) [name], [name], MD as PCP - Infectious Diseases (Infectious Diseases) De Hollingshead, RPH-CPP (Pharmacist)  Indicate any recent Medical Services you may have received from other than [facility] providers in the past year (date may be approximate).     Assessment:   This is a routine wellness examination for Adam Becker.  I connected with Adam Becker today by telephone and verified that I am speaking with the correct person using two identifiers. Location patient: home Location provider: work Persons participating in the  virtual visit: patient, nurse.    I discussed the limitations, risks, security and privacy concerns of performing an evaluation and management service by telephone and the availability of in person appointments. The patient expressed understanding and verbally consented to this telephonic visit.    Interactive audio and video telecommunications were attempted between this provider and patient, however failed, due to patient having technical difficulties OR patient did not have access to video capability.  We continued and completed visit with audio only.  Some vital signs may be absent or patient reported.   Hearing/Vision screen Hearing Screening - Comments:: Patient is able to hear conversational tones without difficulty.  No issues reported. Vision Screening - Comments:: No retinopathy reported.  They have seen their ophthalmologist in the last 12 months.    Dietary issues and exercise activities discussed: Current Exercise Habits: Home exercise routine, Type of exercise: walking, Intensity: Mild  He tries to have a healthy diet Good water intake   Goals Addressed               This Visit's Progress     Patient Stated     COMPLETED: Modified Carb Foods (pt-stated)   On track     Portion Control        Other      Weight goal 215lb        Low carb foods Stay hydrated Stay active and continue to walk for exercise        Depression Screen PHQ 2/9 Scores 08/12/2020 07/01/2020 06/01/2020 09/23/2019 08/11/2019 04/21/2019 11/27/2018  PHQ - 2 Score 0 0 0 0 0 0 0  PHQ- 9 Score - - 2 - - - -    Fall Risk Fall Risk  08/12/2020 07/14/2020 07/01/2020 06/01/2020 04/29/2020  Falls in the past year? 0 0 0 0 0  Number falls in past yr: 0 - - - -  Injury with Fall? 0 - - - -  Risk for fall due to : History of fall(s) - - - -  Follow up - Falls evaluation completed Falls evaluation completed Falls evaluation completed Falls evaluation completed    [place]: Adequate lighting in your home to reduce risk of falls? Yes   ASSISTIVE DEVICES UTILIZED TO PREVENT FALLS: Life alert? No  Use of a cane, walker or w/c? No   TIMED UP AND GO: Was the test performed? No .   Cognitive Function: MMSE - Mini Mental State Exam 07/31/2017 07/25/2016  Orientation to time 5 5  Orientation to Place 5 5  Registration 3 3  Attention/ Calculation 5 5  Recall 3 3  Language- name 2 objects 2 2  Language- repeat 1 1  Language- follow 3 step command 3 3  Language- read & follow direction 1 1  Write a sentence 1 1  Copy design 1 1  Total score 30 30     6CIT Screen 08/12/2020 08/11/2019 08/08/2018  What Year? 0 points 0 points 0 points  What month? 0 points 0 points 0 points  What time? 0 points 0 points 0 points  Count back from 20 0 points - 0 points  Months in reverse 0 points - 0 points  Repeat phrase 0 points - 0 points  Total Score 0 - 0    Immunizations Immunization History  Administered Date(s) Administered   H1N1 01/02/2008   Hepatitis A 12/31/2006, 07/15/2007   Hepatitis B 03/26/2001, 05/06/2001, 10/14/2001   Influenza Inj Mdck Quad Pf  10/12/2017   Influenza Split 11/15/2010, 12/07/2011   Influenza Whole 10/02/2005, 10/15/2006, 11/26/2008, 11/01/2009   Influenza,inj,Quad PF,6+ Mos  10/07/2012, 11/05/2013, 11/11/2014, 10/12/2015, 10/10/2016, 10/24/2019   Influenza-Unspecified 09/28/2018   Meningococcal Conjugate 10/09/2019   Meningococcal Polysaccharide 10/12/2015   PFIZER Comirnaty(Gray Top)Covid-19 Tri-Sucrose Vaccine 06/10/2020   PFIZER(Purple Top)SARS-COV-2 Vaccination 03/28/2019, 04/18/2019, 09/23/2019   Pneumococcal Conjugate-13 06/21/2011   Pneumococcal Polysaccharide-23 09/23/2005, 10/02/2005, 02/29/2012, 09/23/2019    TDAP status: Due, Education has been provided regarding the importance of this vaccine. Advised may receive this vaccine at local pharmacy or Health Dept. Aware to provide a copy of the vaccination record if obtained from local pharmacy or Health Dept. Verbalized acceptance and understanding. Deferred.   Shingles vaccine- deferred per patient.  Colonoscopy/Cologuard- deferred per patient.   Health Maintenance Health Maintenance  Topic Date Due   Meningococcal B Vaccine (1 of 4 - Increased Risk Bexsero 2-dose series) 10/03/1966   Zoster Vaccines- Shingrix (1 of 2) 11/12/2020 (Originally 10/03/1975)   COLONOSCOPY (Pts 45-33yr Insurance coverage will need to be confirmed)  08/12/2021 (Originally 11/08/2018)   TETANUS/TDAP  08/12/2021 (Originally 10[birth year]03/1975)   INFLUENZA VACCINE  08/23/2020   COVID-19 Vaccine (5 - Booster for Pfizer series) 10/11/2020   HEMOGLOBIN A1C  02/03/2021   OPHTHALMOLOGY EXAM  05/20/2021   FOOT EXAM  06/01/2021   Pneumococcal Vaccine 013649Years old (4 - PPSV23 or PCV20) 09/22/2024   PNEUMOCOCCAL POLYSACCHARIDE VACCINE AGE 11-64 HIGH RISK  Completed   Hepatitis C Screening  Completed   HIV Screening  Completed   HPV VACCINES  Aged Out   Lung Cancer Screening: (Low Dose CT Chest recommended if Age 64-80years, 30 pack-year currently smoking OR have quit w/in 15years.) does not qualify.   Vision Screening: Recommended annual ophthalmology exams for early detection of glaucoma and other disorders of the eye.  Dental  Screening: Recommended annual dental exams for proper oral hygiene  Community Resource Referral / Chronic Care Management: CRR required this visit?  No   CCM required this visit?  No      Plan:   Keep all routine maintenance appointments.   I have personally reviewed and noted the following in the patient's chart:   Medical and social history Use of alcohol, tobacco or illicit drugs  Current medications and supplements including opioid prescriptions. Patient is not currently taking opioid prescriptions. Functional ability and status Nutritional status Physical activity Advanced directives List of other physicians Hospitalizations, surgeries, and ER visits in previous 12 months Vitals Screenings to include cognitive, depression, and falls Referrals and appointments  In addition, I have reviewed and discussed with patient certain preventive protocols, quality metrics, and best practice recommendations. A written personalized care plan for preventive services as well as general preventive health recommendations were provided to patient via mail.     OVarney Biles LPN   [id number]

## 2020-08-25 ENCOUNTER — Telehealth: Payer: Self-pay

## 2020-08-25 NOTE — Telephone Encounter (Signed)
PT  has a question about OTC acid reflux medication because his prescription rx has not been working well for two weeks. I was going to schedule him an appt but he just wants a call for advice or he will ask the pharmacist? Thanks

## 2020-08-26 ENCOUNTER — Other Ambulatory Visit: Payer: Self-pay | Admitting: Internal Medicine

## 2020-08-26 NOTE — Telephone Encounter (Signed)
Patient has been notified

## 2020-09-08 ENCOUNTER — Emergency Department
Admission: EM | Admit: 2020-09-08 | Discharge: 2020-09-08 | Disposition: A | Discharge status: Home / Self Care | Payer: Medicare Other | Attending: Emergency Medicine | Admitting: Emergency Medicine

## 2020-09-08 ENCOUNTER — Other Ambulatory Visit: Payer: Self-pay

## 2020-09-08 ENCOUNTER — Emergency Department: Payer: Medicare Other

## 2020-09-08 ENCOUNTER — Telehealth: Payer: Self-pay | Admitting: Internal Medicine

## 2020-09-08 DIAGNOSIS — Z21 Asymptomatic human immunodeficiency virus [HIV] infection status: Secondary | ICD-10-CM | POA: Diagnosis not present

## 2020-09-08 DIAGNOSIS — I129 Hypertensive chronic kidney disease with stage 1 through stage 4 chronic kidney disease, or unspecified chronic kidney disease: Secondary | ICD-10-CM | POA: Diagnosis not present

## 2020-09-08 DIAGNOSIS — R1013 Epigastric pain: Secondary | ICD-10-CM | POA: Diagnosis not present

## 2020-09-08 DIAGNOSIS — Z8673 Personal history of transient ischemic attack (TIA), and cerebral infarction without residual deficits: Secondary | ICD-10-CM | POA: Insufficient documentation

## 2020-09-08 DIAGNOSIS — Z0130 Encounter for examination of blood pressure without abnormal findings: Secondary | ICD-10-CM | POA: Diagnosis not present

## 2020-09-08 DIAGNOSIS — R0789 Other chest pain: Secondary | ICD-10-CM | POA: Diagnosis not present

## 2020-09-08 DIAGNOSIS — Z79899 Other long term (current) drug therapy: Secondary | ICD-10-CM | POA: Insufficient documentation

## 2020-09-08 DIAGNOSIS — R079 Chest pain, unspecified: Secondary | ICD-10-CM | POA: Diagnosis not present

## 2020-09-08 DIAGNOSIS — Z8616 Personal history of COVID-19: Secondary | ICD-10-CM | POA: Insufficient documentation

## 2020-09-08 DIAGNOSIS — Z7982 Long term (current) use of aspirin: Secondary | ICD-10-CM | POA: Diagnosis not present

## 2020-09-08 DIAGNOSIS — N182 Chronic kidney disease, stage 2 (mild): Secondary | ICD-10-CM | POA: Insufficient documentation

## 2020-09-08 DIAGNOSIS — R030 Elevated blood-pressure reading, without diagnosis of hypertension: Secondary | ICD-10-CM | POA: Diagnosis present

## 2020-09-08 LAB — CBC
HCT: 43.7 % (ref 39.0–52.0)
Hemoglobin: 15.4 g/dL (ref 13.0–17.0)
MCH: 33.4 pg (ref 26.0–34.0)
MCHC: 35.2 g/dL (ref 30.0–36.0)
MCV: 94.8 fL (ref 80.0–100.0)
Platelets: 373 10*3/uL (ref 150–400)
RBC: 4.61 MIL/uL (ref 4.22–5.81)
RDW: 14.9 % (ref 11.5–15.5)
WBC: 6.1 10*3/uL (ref 4.0–10.5)
nRBC: 0 % (ref 0.0–0.2)

## 2020-09-08 LAB — BASIC METABOLIC PANEL
Anion gap: 10 (ref 5–15)
BUN: 13 mg/dL (ref 8–23)
CO2: 26 mmol/L (ref 22–32)
Calcium: 9.5 mg/dL (ref 8.9–10.3)
Chloride: 101 mmol/L (ref 98–111)
Creatinine, Ser: 1.22 mg/dL (ref 0.61–1.24)
GFR, Estimated: >60 mL/min (ref >60)
Glucose, Bld: 106 mg/dL — ABNORMAL HIGH (ref 70–99)
Potassium: 4.3 mmol/L (ref 3.5–5.1)
Sodium: 137 mmol/L (ref 135–145)

## 2020-09-08 LAB — TROPONIN I (HIGH SENSITIVITY): Troponin I (High Sensitivity): 11 ng/L (ref <18)

## 2020-09-08 NOTE — Telephone Encounter (Signed)
Patient is currently in ED 

## 2020-09-08 NOTE — Telephone Encounter (Signed)
Patient informed, Due to the high volume of calls and your symptoms we have to forward your call to our Triage Nurse to expedient your call. Please hold for the transfer.  Patient transferred to Access Nurse. Due to having high blood pressure readings the past few days and feeling different than usual.He noticed the number is higher on the bottom and his blood pressure was 151/100.No openings in office or virtual.

## 2020-09-08 NOTE — Telephone Encounter (Signed)
Instructed by Dr Derrel Nip through secure chat that patient is needing to be scheduled for follow up.  Patient was seen in ED. PCP ok with a couple weeks out for appointment. Left message for patient to return call back.

## 2020-09-08 NOTE — ED Notes (Signed)
ED Provider at bedside. 

## 2020-09-08 NOTE — ED Provider Notes (Signed)
[facility] Emergency Department Provider Note  ____________________________________________   Event Date/Time   First MD Initiated Contact with Patient 09/08/20 1233     (approximate)  I have reviewed the triage vital signs and the nursing notes.   HISTORY  Chief Complaint Chest Pain    HPI Adam Becker is a 65 y.o. male history of chronic kidney disease HIV compliant with therapy hypertension hyperlipidemia  On patient reports he came to the ER to have his blood pressure checked.  He is noticed its been high at home as high as 150/100 this evening and this morning, but is not having any symptoms with it.  He reports he called his primary doctor's office they recommended he come to the ER to have it checked.  He is compliant with his blood pressure medicine, took it this morning.  He has had no headaches no chest pain no trouble breathing.  He does mention he has had a little bit of discomfort that he would even consider pain in his upper mid abdomen for the last 2 weeks after eating but attributes it to a feeling of acid reflux which has had before and had been off reflux medicine but he has since restarted omeprazole and reports it started to help.  He is currently not having any discomfort or pain anywhere  Sees Dr. 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   Fracture of phalanx of foot 06/24/2019   Avulsion of toenail of right foot 06/16/2019    Abdominal wall mass 03/11/2019   Family history of colon cancer in father 08/10/2018   Neuropathy 01/12/2018   Obesity, diabetes, 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   TIA (transient ischemic attack) 08/18/2017   Need for immunization against influenza 10/10/2016   Prostate cancer screening 10/10/2016   Asplenia 11/11/2014   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   Routine adult health maintenance 04/23/2014   Allergic rhinitis due to allergen 04/23/2014   Chronic kidney disease (CKD), 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   History of gout 09/20/2012   Hyperlipidemia 11/01/2009   NEUROSYPHILIS 03/11/2009   PARESTHESIA, HANDS 06/18/2008   LATE SYPHILIS, LATENT 10/30/2007   ERECTILE DYSFUNCTION, MILD 12/31/2006   Human immunodeficiency virus (HIV) disease ([place]) 04/16/2006    Past Surgical History:  Procedure Laterality Date   COLONOSCOPY     LIPOSUCTION HEAD / NECK  2001   posterior neck   SPLENECTOMY  2000   reason unclear "stopped working"    Prior to Admission medications   Medication Sig Start Date End Date Taking? Authorizing Provider  allopurinol (ZYLOPRIM) 100 MG tablet TAKE 1 TABLET BY MOUTH EVERY DAY 05/24/20   [name], MD  aspirin EC 81 MG tablet Take 1 tablet (81 mg total) by mouth daily. 08/22/18   [name], FNP  blood glucose meter kit and supplies KIT Dispense based on patient and insurance preference. Use up to four times daily as directed. 05/13/20   Deborra Medina  L, MD  cholecalciferol (VITAMIN D3) 25 MCG (1000 UNIT) tablet Take 1,000 Units by mouth daily.    [provider]  darunavir (PREZISTA) 800 MG tablet Take 1 tablet (800 mg total) by mouth daily. 07/01/20   Truman Hayward, MD  elvitegravir-cobicistat-emtricitabine-tenofovir (GENVOYA) 150-150-200-10 MG TABS tablet TAKE 1 TABLET BY MOUTH EVERY DAY WITH BREAKFAST 07/01/20   [name], [name], MD  empagliflozin (JARDIANCE) 10 MG TABS tablet Take 1 tablet (10 mg total) by mouth daily before  breakfast. 06/01/20   [name], MD  gabapentin (NEURONTIN) 300 MG capsule Take 2 capsules (600 mg total) by mouth 3 (three) times daily. 07/14/20   [name], MD  glucose blood test strip Use as instructed to check glucose BID 05/14/20   [name], MD  Lancets [facility] ULTRASOFT) lancets Use as instructed to check glucose BID 05/14/20   [name], MD  metFORMIN (GLUCOPHAGE) 500 MG tablet Take 1 tablet QAM and 2 tablets QPM 07/08/20   [name], MD  predniSONE (DELTASONE) 10 MG tablet 6 tablets on Day 1 , then reduce by 1 tablet daily until gone 07/14/20   [name], MD  rOPINIRole (REQUIP) 1 MG tablet Take 1 mg by mouth 4 (four) times daily. 06/02/20   [provider]  telmisartan (MICARDIS) 40 MG tablet TAKE 1 TABLET BY MOUTH EVERYDAY AT BEDTIME 01/19/20   [name], MD  tiZANidine (ZANAFLEX) 4 MG tablet Take 1 tablet (4 mg total) by mouth every 6 (six) hours as needed for muscle spasms. 07/14/20   [name], MD  vitamin B-12 (CYANOCOBALAMIN) 1000 MCG tablet Take 1,000 mcg by mouth daily.    [provider]    Allergies Pravastatin  Family History  Problem Relation Age of Onset   Cancer Mother    Breast cancer Mother    Colon cancer Father    Birth defects Son        Breast     Social History Social History   Tobacco Use   Smoking status: Never   Smokeless tobacco: Never  Vaping Use   Vaping Use: Never used  Substance Use Topics   Alcohol use: No   Drug use: No    Review of Systems Constitutional: No fever/chills Eyes: No visual changes. ENT: No sore throat. Cardiovascular: Denies chest pain. Respiratory: Denies shortness of breath. Gastrointestinal: No abdominal pain.  Reports there is no pain.  Just a little discomfort that occurs after eating.  He denies that the pain is underneath his right ribs or radiates to the back at all.  Currently does not experience any discomfort and seems to be getting better after  take omeprazole Genitourinary: Negative for dysuria. Musculoskeletal: Negative for back pain. Neurological: Negative for headaches, areas of focal weakness or numbness.    ____________________________________________   PHYSICAL EXAM:  VITAL SIGNS: ED Triage Vitals  Enc Vitals Group     BP 09/08/20 1053 129/87     Pulse Rate 09/08/20 1053 75     Resp 09/08/20 1053 17     Temp 09/08/20 1053 98.6 F (37 C)     Temp Source 09/08/20 1053 Oral     SpO2 09/08/20 1053 98 %     Weight 09/08/20 1054 226 lb (102.5 kg)     Height 09/08/20 1054 '5\' 11"'  (1.803 m)     Head Circumference --      Peak Flow --  Pain Score 09/08/20 1054 3     Pain Loc --      Pain Edu? --      Excl. in [place]? --     Constitutional: Alert and oriented. Well appearing and in no acute distress. Eyes: Conjunctivae are normal. Head: Atraumatic. Nose: No congestion/rhinnorhea. Mouth/Throat: Mucous membranes are moist. Neck: No stridor.  Cardiovascular: Normal rate, regular rhythm. Grossly normal heart sounds.  Good peripheral circulation. Respiratory: Normal respiratory effort.  No retractions. Lungs CTAB. Gastrointestinal: Soft and nontender. No distention.  Somewhat obese.  Negative Murphy.  No rebound guarding or discomfort in any quadrant. Musculoskeletal: No lower extremity tenderness nor edema. Neurologic:  Normal speech and language. No gross focal neurologic deficits are appreciated.  Skin:  Skin is warm, dry and intact. No rash noted. Psychiatric: Mood and affect are normal. Speech and behavior are normal.  Patient is very pleasant. ____________________________________________   LABS (all labs ordered are listed, but only abnormal results are displayed)  Labs Reviewed  BASIC METABOLIC PANEL - Abnormal; Notable for the following components:      Result Value   Glucose, Bld 106 (*)    All other components within normal limits  CBC  TROPONIN I (HIGH SENSITIVITY)    ____________________________________________  EKG  Reviewed inter by me at 1059 heart rate 79 QRS 159 QTc 450 Sinus rhythm first-degree AV block, bifascicular block, right bundle branch block.  No obvious ischemic abnormality noted within the T waves.  Similar in morphology to February 27, 2019.  No obvious acute ischemia ____________________________________________  RADIOLOGY  DG Chest 2 View  Result Date: 09/08/2020 CLINICAL DATA:  Chest pain EXAM: CHEST - 2 VIEW COMPARISON:  02/27/2019 FINDINGS: Stable cardiomediastinal contours. No focal airspace consolidation, pleural effusion, or pneumothorax. Osseous structures appear within normal limits. IMPRESSION: No active cardiopulmonary disease. Electronically Signed   By: Davina Poke D.O.   On: 09/08/2020 11:57     Chest x-ray reviewed negative for acute finding ____________________________________________   PROCEDURES  Procedure(s) performed: None  Procedures  Critical Care performed: No  ____________________________________________   INITIAL IMPRESSION / ASSESSMENT AND PLAN / ED COURSE  Pertinent labs & imaging results that were available during my care of the patient were reviewed by me and considered in my medical decision making (see chart for details).   Patient presents for evaluation for "blood pressure check".  He is asymptomatic to any hypertension, no symptoms of hypertensive urgency or emergency.  His EKG reassuring, normal single troponin.  Blood pressure normotensive in the ER.  Advised patient to follow-up closely with PCP, discussed return precautions around hypertension and hypertensive urgency symptoms.  Addition we discussed further evaluation of his discomfort after eating including further evaluation such as a second test of his heart and additional laboratory testing to exclude cardiac etiology though my pretest probability for this is very low.  Also discussed getting liver and pancreatic blood testing  but patient reports he had that about a month ago with primary and does not wish to have it again especially as his symptoms are now improving after being on omeprazole and he is not having any symptoms right now.  No signs or symptoms to suggest high risk for aortic dissection, pulmonary embolism, acute intra-abdominal catastrophe, cholecystitis, pancreatitis.  Abdominal exam very reassuring laboratory evaluation here reassuring.  Discussed with the patient shared medical decision making regarding waiting for additional laboratory testing to result, including troponin, liver testing, and pancreatic testing but patient reports he is completely without symptoms  and came simply to have his blood pressure evaluated here at the ER.  On PCP Dr. Derrel Nip notified of results of visit today, and Dr. Francesco Runner advised that their clinic will reach out for scheduling follow-up appointment.  Return precautions and treatment recommendations and follow-up discussed with the patient who is agreeable with the plan.       ____________________________________________   FINAL CLINICAL IMPRESSION(S) / ED DIAGNOSES  Final diagnoses:  Blood pressure check  Postprandial epigastric pain        Note:  This document was prepared using Dragon voice recognition software and may include unintentional dictation errors       Delman Kitten, MD 09/08/20 1318

## 2020-09-08 NOTE — Telephone Encounter (Signed)
Patient called office back and appointment made.

## 2020-09-08 NOTE — ED Triage Notes (Signed)
Pt c/o intermittent epigastric/chest pain for the past 1-2 weeks, states "I thinks its indigestion" states his PCP placed him on OTC meds that seem to help but the sx worsen after eating. Pt is in NAD.

## 2020-09-14 DIAGNOSIS — G4710 Hypersomnia, unspecified: Secondary | ICD-10-CM | POA: Diagnosis not present

## 2020-09-14 DIAGNOSIS — G4719 Other hypersomnia: Secondary | ICD-10-CM | POA: Diagnosis not present

## 2020-09-14 DIAGNOSIS — G2581 Restless legs syndrome: Secondary | ICD-10-CM | POA: Diagnosis not present

## 2020-09-14 DIAGNOSIS — G608 Other hereditary and idiopathic neuropathies: Secondary | ICD-10-CM | POA: Diagnosis not present

## 2020-09-14 DIAGNOSIS — E559 Vitamin D deficiency, unspecified: Secondary | ICD-10-CM | POA: Diagnosis not present

## 2020-09-20 ENCOUNTER — Other Ambulatory Visit: Payer: Self-pay | Admitting: Internal Medicine

## 2020-10-05 ENCOUNTER — Ambulatory Visit (INDEPENDENT_AMBULATORY_CARE_PROVIDER_SITE_OTHER): Payer: Medicare Other | Admitting: Pharmacist

## 2020-10-05 DIAGNOSIS — I152 Hypertension secondary to endocrine disorders: Secondary | ICD-10-CM

## 2020-10-05 DIAGNOSIS — B20 Human immunodeficiency virus [HIV] disease: Secondary | ICD-10-CM

## 2020-10-05 DIAGNOSIS — E7800 Pure hypercholesterolemia, unspecified: Secondary | ICD-10-CM

## 2020-10-05 DIAGNOSIS — N182 Chronic kidney disease, stage 2 (mild): Secondary | ICD-10-CM

## 2020-10-05 DIAGNOSIS — E1129 Type 2 diabetes mellitus with other diabetic kidney complication: Secondary | ICD-10-CM

## 2020-10-05 DIAGNOSIS — E1159 Type 2 diabetes mellitus with other circulatory complications: Secondary | ICD-10-CM

## 2020-10-05 NOTE — Patient Instructions (Signed)
Adam Becker,   It was great talking to you today!  Congratulations on that fantastic A1c! Continue metformin 500 mg in the morning and 1000 mg in the evening and Jardiance 10 mg daily. I would advise that you periodically check your blood sugar at home to make sure fasting readings are staying less than 130 and 2 hour after meal readings are staying less than 180.   Continue telmisartan 40 mg daily. Our goal blood pressure is less than 130/80. If your blood pressure machine is known to be inaccurate, it might be a good idea to consider getting a new one.   We recommend you get the influenza vaccine for this season. You can get this at your upcoming appointment with Dr. Derrel Nip.  We recommend you get the updated bivalent COVID-19 booster, at least 2 months after any prior doses. You can find pharmacies that have this formulation in stock at [email or web address]. Your local [facility] appears to have it.   Take care!  Adam Becker, PharmD [phone]  Visit Information  PATIENT GOALS:  Goals Addressed               This Visit's Progress     Patient Stated     Medication Management (pt-stated)        Patient Goals/Self-Care Activities Over the next 90 days, patient will:  - take medications as prescribed check glucose twice daily, document, and provide at future appointments engage in dietary modifications by reducing carbohydrate portion sizes        The patient verbalized understanding of instructions, educational materials, and care plan provided today and agreed to receive a mailed copy of patient instructions, educational materials, and care plan.   Plan: Telephone follow up appointment with care management team member scheduled for:  ~ 6 weeks  Adam Becker, PharmD, [place], [place] Clinical Pharmacist [employer] [facility] [employer] [phone]

## 2020-10-05 NOTE — Chronic Care Management (AMB) (Signed)
Chronic Care Management Pharmacy Note  10/05/2020 Name:  Adam Becker MRN:  [id number] DOB:  [birth year]   Subjective: Adam Becker is an 65 y.o. year old male who is a primary patient of [name], [name], MD.  The CCM team was consulted for assistance with disease management and care coordination needs.    Engaged with patient by telephone for follow up visit in response to provider referral for pharmacy case management and/or care coordination services.   Consent to Services:  The patient was given information about Chronic Care Management services, agreed to services, and gave verbal consent prior to initiation of services.  Please see initial visit note for detailed documentation.   Patient Care Team: [name], MD as PCP - General (Internal Medicine) [name], [name], MD as PCP - Infectious Diseases (Infectious Diseases) De Hollingshead, RPH-CPP (Pharmacist)  Objective:  Lab Results  Component Value Date   CREATININE 1.22 09/08/2020   CREATININE 1.20 08/03/2020   CREATININE 1.31 (H) 06/10/2020    Lab Results  Component Value Date   HGBA1C 6.6 (H) 08/03/2020   Last diabetic Eye exam:  Lab Results  Component Value Date/Time   HMDIABEYEEXA No Retinopathy 05/20/2020 12:00 AM    Last diabetic Foot exam: No results found for: HMDIABFOOTEX      Component Value Date/Time   CHOL 226 (H) 04/29/2020 0854   TRIG (H) 04/29/2020 0854    497.0 Triglyceride is over 400; calculations on Lipids are invalid.   HDL 38.10 (L) 04/29/2020 0854   CHOLHDL 6 04/29/2020 0854   VLDL 27.0 05/29/2019 0908   LDLCALC 119 (H) 09/09/2019 0917   LDLDIRECT 89.0 04/29/2020 0854    Hepatic Function Latest Ref Rng & Units 08/03/2020 06/10/2020 04/29/2020  Total Protein 6.0 - 8.3 g/dL 7.9 8.1 8.3  Albumin 3.5 - 5.2 g/dL 4.5 - 4.5  AST 0 - 37 U/L '24 29 28  ' ALT 0 - 53 U/L 26 27 38  Alk Phosphatase 39 - 117 U/L 49 - 69  Total Bilirubin 0.2 - 1.2 mg/dL 0.5 0.5 0.5  Bilirubin,  Direct 0.0 - 0.3 mg/dL - - -    Lab Results  Component Value Date/Time   TSH 1.06 04/29/2020 08:54 AM   TSH 1.43 03/11/2019 08:47 AM   FREET4 0.74 01/10/2018 12:05 PM    CBC Latest Ref Rng & Units 09/08/2020 06/10/2020 01/29/2020  WBC 4.0 - 10.5 K/uL 6.1 6.6 5.6  Hemoglobin 13.0 - 17.0 g/dL 15.4 14.1 14.0  Hematocrit 39.0 - 52.0 % 43.7 40.6 41.0  Platelets 150 - 400 K/uL 373 481(H) 344.0    No results found for: VD25OH  Clinical ASCVD: No  The 10-year ASCVD risk score (Arnett DK, et al., 2019) is: 33.9%   Values used to calculate the score:     Age: 43 years     Sex: Male     Is Non-Hispanic African American: Yes     Diabetic: Yes     Tobacco smoker: No     Systolic Blood Pressure: 143 mmHg     Is BP treated: Yes     HDL Cholesterol: 38.1 mg/dL     Total Cholesterol: 226 mg/dL      Social History   Tobacco Use  Smoking Status Never  Smokeless Tobacco Never   BP Readings from Last 3 Encounters:  09/08/20 131/77  07/14/20 120/74  06/01/20 116/76   Pulse Readings from Last 3 Encounters:  09/08/20 70  07/14/20  80  06/01/20 69   Wt Readings from Last 3 Encounters:  09/08/20 226 lb (102.5 kg)  08/12/20 228 lb (103.4 kg)  07/14/20 228 lb (103.4 kg)    Assessment: Review of patient past medical history, allergies, medications, health status, including review of consultants reports, laboratory and other test data, was performed as part of comprehensive evaluation and provision of chronic care management services.   SDOH:  (Social Determinants of Health) assessments and interventions performed:  SDOH Interventions    Flowsheet Row Most Recent Value  SDOH Interventions   Financial Strain Interventions Intervention Not Indicated       CCM Care Plan  Allergies  Allergen Reactions   Pravastatin Other (See Comments)    rhabdomyolysis    Medications Reviewed Today     Reviewed by De Hollingshead, RPH-CPP (Pharmacist) on 10/05/20 at 1615  Med List Status:  <None>   Medication Order Taking? Sig Documenting Provider Last Dose Status Informant  allopurinol (ZYLOPRIM) 100 MG tablet [id number] Yes TAKE 1 TABLET BY MOUTH EVERY DAY [name], MD Taking Active   aspirin EC 81 MG tablet [id number] Yes Take 1 tablet (81 mg total) by mouth daily. [name], FNP Taking Active   blood glucose meter kit and supplies KIT [id number] Yes Dispense based on patient and insurance preference. Use up to four times daily as directed. [name], MD Taking Active   cholecalciferol (VITAMIN D3) 25 MCG (1000 UNIT) tablet [id number] Yes Take 1,000 Units by mouth daily. [provider] Taking Active   darunavir (PREZISTA) 800 MG tablet [id number] Yes Take 1 tablet (800 mg total) by mouth daily. [name], [name], MD Taking Active   elvitegravir-cobicistat-emtricitabine-tenofovir (GENVOYA) 150-150-200-10 MG TABS tablet [id number] Yes TAKE 1 TABLET BY MOUTH EVERY DAY WITH BREAKFAST [name], [name], MD Taking Active   empagliflozin (JARDIANCE) 10 MG TABS tablet [id number] Yes Take 1 tablet (10 mg total) by mouth daily before breakfast. [name], MD Taking Active   gabapentin (NEURONTIN) 300 MG capsule [id number] Yes Take 2 capsules (600 mg total) by mouth 3 (three) times daily. [name], MD Taking Active   glucose blood test strip [id number] Yes Use as instructed to check glucose BID [name], MD Taking Active   Lancets [facility] ULTRASOFT) lancets [id number] Yes Use as instructed to check glucose BID [name], MD Taking Active   metFORMIN (GLUCOPHAGE) 500 MG tablet [id number] Yes Take 1 tablet QAM and 2 tablets QPM [name], MD Taking Active   rOPINIRole (REQUIP) 1 MG tablet [id number] Yes Take 1.5 mg by mouth 4 (four) times daily. [provider] Taking Active   telmisartan (MICARDIS) 40 MG tablet [id number] Yes TAKE 1 TABLET BY MOUTH EVERYDAY AT BEDTIME [name], MD Taking Active   tiZANidine (ZANAFLEX) 4 MG  tablet [id number] No Take 1 tablet (4 mg total) by mouth every 6 (six) hours as needed for muscle spasms.  Patient not taking: Reported on 10/05/2020   [name], MD Not Taking Active   vitamin B-12 (CYANOCOBALAMIN) 1000 MCG tablet [id number] Yes Take 1,000 mcg by mouth daily. [provider] Taking Active   Vitamin D, Ergocalciferol, (DRISDOL) 1.25 MG (50000 UNIT) CAPS capsule [id number] Yes Take 50,000 Units by mouth once a week. 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   Fracture of phalanx of foot 06/24/2019   Avulsion of toenail of right foot 06/16/2019   Abdominal wall mass 03/11/2019   Family history of colon cancer in father 08/10/2018   Neuropathy 01/12/2018   Obesity, diabetes, and hypertension syndrome ([place]) 01/11/2018   TIA (transient ischemic attack) 08/18/2017   Need for immunization against influenza 10/10/2016   Prostate cancer screening 10/10/2016   Asplenia 11/11/2014   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   Routine adult health maintenance 04/23/2014   Allergic rhinitis due to allergen 04/23/2014   Chronic kidney disease (CKD), 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   History of gout 09/20/2012   Hyperlipidemia 11/01/2009   NEUROSYPHILIS 03/11/2009   PARESTHESIA, HANDS 06/18/2008   LATE SYPHILIS, LATENT 10/30/2007   ERECTILE DYSFUNCTION, MILD 12/31/2006   Human immunodeficiency virus (HIV) disease ([place]) 04/16/2006    Immunization History  Administered Date(s) Administered   H1N1 01/02/2008   Hepatitis A 12/31/2006, 07/15/2007   Hepatitis B 03/26/2001, 05/06/2001, 10/14/2001   Influenza Inj Mdck Quad Pf 10/12/2017   Influenza Split 11/15/2010, 12/07/2011   Influenza Whole 10/02/2005, 10/15/2006, 11/26/2008, 11/01/2009    Influenza,inj,Quad PF,6+ Mos 10/07/2012, 11/05/2013, 11/11/2014, 10/12/2015, 10/10/2016, 10/24/2019   Influenza-Unspecified 09/28/2018   Meningococcal Conjugate 10/09/2019   Meningococcal Polysaccharide 10/12/2015   PFIZER Comirnaty(Gray Top)Covid-19 Tri-Sucrose Vaccine 06/10/2020   PFIZER(Purple Top)SARS-COV-2 Vaccination 03/28/2019, 04/18/2019, 09/23/2019   Pneumococcal Conjugate-13 06/21/2011   Pneumococcal Polysaccharide-23 09/23/2005, 10/02/2005, 02/29/2012, 09/23/2019    Conditions to be addressed/monitored: HTN, HLD, and DMII  Care Plan : Medication Management  Updates made by De Hollingshead, RPH-CPP since 10/05/2020 12:00 AM     Problem: Diabetes, HTN, HLD      Long-Range Goal: Disease Progression Prevention   Start Date: 05/13/2020  This Visit's Progress: On track  Recent Progress: On track  Priority: High  Note:   Current Barriers:  Unable to achieve control of diabetes   Pharmacist Clinical Goal(s):  Over the next 90 days, patient will achieve control of diabetes as evidenced by A1c through collaboration with PharmD and provider.   Interventions: 1:1 collaboration with [name], MD regarding development and update of comprehensive plan of care as evidenced by provider attestation and co-signature Inter-disciplinary care team collaboration (see longitudinal plan of care) Comprehensive medication review performed; medication list updated in electronic medical record  Health Maintenance Yearly diabetic eye exam: up to date Yearly diabetic foot exam: up to date Urine microalbumin: up to date Yearly influenza vaccination: due - he plans to get at his upcoming PCP appt Td/Tdap vaccination: up to date Pneumonia vaccination: up to date COVID vaccinations: due - recommended bivalent booster at his local pharmacy Shingrix vaccinations: up to date - reports he received once dose but doesn't remember the date.  Colonoscopy: up to date  Diabetes: Controlled;  current treatment: metformin 500 mg QAM, 1000 mg QPM, Jardiance 10 mg daily Current glucose readings: fasting glucose: 100-120 Current exercise: tries to walk on treadmill in the morning, has incorporated weights every other day Reviewed goal A1c, goal fasting, goal 2 hour post prandial glucose.   Recommended to continue current regimen at this time.   Hypertension: Controlled per home readings; current treatment: telmisartan 40 mg daily Denies any continued elevations like he saw that prompted ED evaluation Current home readings: 125-130s/80s; reports that he did bring his home BP machine for comparison and it was ~ 5 mmHg higher  Advised to consider purchasing  a new BP machine if he has realized his is inaccurate.  Recommended to continue current regimen at this time. Consider dose increase of telmisartan if in office BP is >130  Hyperlipidemia and ASCVD risk reduction: Uncontrolled; current treatment: none Antiplatelet regimen: aspirin 81 mg daily Previous regimen: muscle aches w/ pravastatin w/ CK elevation DDI with ART therapy and several statins (Genvoya can increase concentrations of lovastatin, simvastatin, and atorvastatin; Prezista can increase concentrations of lovastatin, simvastatin, atorvastatin, pravastatin, and rosuvastatin). 10 year ASCVD risk >30%, as well as having DM and risk enhancing factor of HIV. Consider retrial of statin, consider use of rosuvastatin 5 mg with close monitoring of liver enzymes, CK, and patient symptoms. Will discuss with PCP.  HIV: Managed by ID; current regimen: Prezista 800 mg daily, Genvoya 150/150/200/10 mg  Recommended to continue current regimen at this time along with ID collaboration.   Peripheral Neuropathy; Moderately well managed; current regimen: gabapentin 600 mg TID - reports that neurology had wanted him to try 400 mg TID, he tried this for a little while and had recurrent neuropathy, so has since increased back to 600 mg TID;  ropinirole 1.5 mg QID; Follows w/ neurology Dr. Manuella Ghazi Ferritin at goal.  Recommended to continue current regimen at this time along with neurology collaboration.   Gout: Controlled; current regimen: allopurinol 100 mg daily Last uric acid at goal Recommended to continue current regimen at this time  Patient Goals/Self-Care Activities Over the next 90 days, patient will:  - take medications as prescribed check glucose twice daily, document, and provide at future appointments engage in dietary modifications by reducing carbohydrate portion sizes  Follow Up Plan: Telephone follow up appointment with care management team member scheduled for: ~ 6 weeks     Medication Assistance: None required.  Patient affirms current coverage meets needs.  Patient's preferred pharmacy is:  [id number] [address] [place] [id number]: [phone]: [phone]  Follow Up:  Patient agrees to Care Plan and Follow-up.  Plan: Telephone follow up appointment with care management team member scheduled for:  ~ 6 weeks  Catie TDarnelle Maffucci PharmD, BLake Buckhorn CSumterClinical Pharmacist LOccidental Petroleumat BJohnson [employer] [phone]

## 2020-10-11 DIAGNOSIS — Z23 Encounter for immunization: Secondary | ICD-10-CM | POA: Diagnosis not present

## 2020-10-19 ENCOUNTER — Ambulatory Visit (INDEPENDENT_AMBULATORY_CARE_PROVIDER_SITE_OTHER): Payer: Medicare Other | Admitting: Internal Medicine

## 2020-10-19 ENCOUNTER — Telehealth: Payer: Self-pay | Admitting: Gastroenterology

## 2020-10-19 ENCOUNTER — Encounter: Payer: Self-pay | Admitting: Internal Medicine

## 2020-10-19 ENCOUNTER — Other Ambulatory Visit: Payer: Self-pay

## 2020-10-19 VITALS — BP 122/76 | HR 78 | Temp 96.2°F | Ht 71.0 in | Wt 230.4 lb

## 2020-10-19 DIAGNOSIS — E669 Obesity, unspecified: Secondary | ICD-10-CM

## 2020-10-19 DIAGNOSIS — I152 Hypertension secondary to endocrine disorders: Secondary | ICD-10-CM

## 2020-10-19 DIAGNOSIS — E1159 Type 2 diabetes mellitus with other circulatory complications: Secondary | ICD-10-CM

## 2020-10-19 DIAGNOSIS — N182 Chronic kidney disease, stage 2 (mild): Secondary | ICD-10-CM | POA: Diagnosis not present

## 2020-10-19 DIAGNOSIS — E1169 Type 2 diabetes mellitus with other specified complication: Secondary | ICD-10-CM | POA: Diagnosis not present

## 2020-10-19 MED ORDER — TETANUS-DIPHTH-ACELL PERTUSSIS 5-2.5-18.5 LF-MCG/0.5 IM SUSY: 0.5000 mL | PREFILLED_SYRINGE | Freq: Once | INTRAMUSCULAR | 0 refills | Status: AC

## 2020-10-19 NOTE — Assessment & Plan Note (Signed)
Stable.  GFR > 50  Lab Results  Component Value Date   CREATININE 1.22 09/08/2020   Lab Results  Component Value Date   NA 137 09/08/2020   K 4.3 09/08/2020   CL 101 09/08/2020   CO2 26 09/08/2020

## 2020-10-19 NOTE — Assessment & Plan Note (Addendum)
Improved  glycemic control with addition of Jardiance but with  worsening proteinuria     BP is at goal with ARB. Hyperlipidemia now untreated due to statin myopathy suggested by elevated CK , normal ANA and history of muscle pain ,  With significant improvement in signs and symptoms upon suspension of pravastatin . Not clear if there was an interaction with his HAART therapy.  Will discuss addition of Zetia with Dr. Darnelle Maffucci and also consider adding Ozempic and Repatha after next blood draw  given persistent BMI > 30  Repeat A1c needed on or after October 12   Lab Results  Component Value Date   HGBA1C 6.6 (H) 08/03/2020   Lab Results  Component Value Date   MICROALBUR 23.4 (H) 04/29/2020   MICROALBUR 5.7 (H) 03/11/2019

## 2020-10-19 NOTE — Telephone Encounter (Signed)
Hi Dr. Candis Schatz,   We received a referral for patient to have a recall colonoscopy due to family history of colon cancer in father. This was Dr. Nichola Sizer patient and has recall in Epic for 10/2023 please review records and advise on scheduling.    Thank you

## 2020-10-19 NOTE — Progress Notes (Signed)
Subjective:  Patient ID: Adam Becker, male    DOB: [birth year]  Age: 65 y.o. MRN: [id number]  CC: The primary encounter diagnosis was Obesity, diabetes, and hypertension syndrome ([place]). A diagnosis of Chronic kidney disease (CKD), stage II (mild) was also pertinent to this visit.  HPI Adam Becker presents for FOLLOW UP ON HYPERTENSION, OBESITY AND DIABETES Chief Complaint  Patient presents with   Follow-up    Follow up on diabetes and hypertension    This visit occurred during the SARS-CoV-2 public health emergency.  Safety protocols were in place, including screening questions prior to the visit, additional usage of staff PPE, and extensive cleaning of exam room while observing appropriate contact time as indicated for disinfecting solutions.    1) t2dm with obesity htn and ckd  He feels generally well, is exercising several times per week and checking blood sugars once daily at variable times.  BS have been under 130 fasting and < 150 post prandially.  Denies any recent hypoglyemic events.  Taking his metformin  and jardiance .  Following a carbohydrate modified diet 6 days per week. Denies numbness, burning and tingling of extremities. Appetite is good.   2) colon cancer screening: patient is overdue for 5 yr colonoscopy due to paternal history . Says he was called yesterday from "somebody at [facility] and told he did not need one until 2025 "    3) in home sleep study is scheduled for Oct 21.   4) RLS medication increased  for symptoms  control   Outpatient Medications Prior to Visit  Medication Sig Dispense Refill   allopurinol (ZYLOPRIM) 100 MG tablet TAKE 1 TABLET BY MOUTH EVERY DAY 90 tablet 1   aspirin EC 81 MG tablet Take 1 tablet (81 mg total) by mouth daily. 90 tablet 3   blood glucose meter kit and supplies KIT Dispense based on patient and insurance preference. Use up to four times daily as directed. 1 each 11   cholecalciferol (VITAMIN D3) 25 MCG (1000 UNIT) tablet  Take 1,000 Units by mouth daily.     darunavir (PREZISTA) 800 MG tablet Take 1 tablet (800 mg total) by mouth daily. 60 tablet 11   elvitegravir-cobicistat-emtricitabine-tenofovir (GENVOYA) 150-150-200-10 MG TABS tablet TAKE 1 TABLET BY MOUTH EVERY DAY WITH BREAKFAST 30 tablet 11   empagliflozin (JARDIANCE) 10 MG TABS tablet Take 1 tablet (10 mg total) by mouth daily before breakfast. 30 tablet 11   gabapentin (NEURONTIN) 100 MG capsule Take 100 mg by mouth 3 (three) times daily.     glucose blood test strip Use as instructed to check glucose BID 100 each 12   Lancets (ONETOUCH ULTRASOFT) lancets Use as instructed to check glucose BID 100 each 12   metFORMIN (GLUCOPHAGE) 500 MG tablet Take 1 tablet QAM and 2 tablets QPM 270 tablet 3   pravastatin (PRAVACHOL) 40 MG tablet Take 40 mg by mouth daily.     rOPINIRole (REQUIP) 1 MG tablet Take 1.5 mg by mouth 4 (four) times daily.     telmisartan (MICARDIS) 40 MG tablet TAKE 1 TABLET BY MOUTH EVERYDAY AT BEDTIME 90 tablet 1   tiZANidine (ZANAFLEX) 4 MG tablet Take 1 tablet (4 mg total) by mouth every 6 (six) hours as needed for muscle spasms. 30 tablet 0   vitamin B-12 (CYANOCOBALAMIN) 1000 MCG tablet Take 1,000 mcg by mouth daily.     Vitamin D, Ergocalciferol, (DRISDOL) 1.25 MG (50000 UNIT) CAPS capsule Take 50,000 Units by mouth once a  week.     gabapentin (NEURONTIN) 300 MG capsule Take 2 capsules (600 mg total) by mouth 3 (three) times daily. (Patient not taking: Reported on 10/19/2020) 180 capsule 5   No facility-administered medications prior to visit.    Review of Systems;  Patient denies headache, fevers, malaise, unintentional weight loss, skin rash, eye pain, sinus congestion and sinus pain, sore throat, dysphagia,  hemoptysis , cough, dyspnea, wheezing, chest pain, palpitations, orthopnea, edema, abdominal pain, nausea, melena, diarrhea, constipation, flank pain, dysuria, hematuria, urinary  Frequency, nocturia, numbness, tingling,  seizures,  Focal weakness, Loss of consciousness,  Tremor, insomnia, depression, anxiety, and suicidal ideation.      Objective:  BP 122/76 (BP Location: Left Arm, Patient Position: Sitting, Cuff Size: Large)   Pulse 78   Temp (!) 96.2 F (35.7 C) (Temporal)   Ht '5\' 11"'  (1.803 m)   Wt 230 lb 6.4 oz (104.5 kg)   SpO2 95%   BMI 32.13 kg/m   BP Readings from Last 3 Encounters:  10/19/20 122/76  09/08/20 131/77  07/14/20 120/74    Wt Readings from Last 3 Encounters:  10/19/20 230 lb 6.4 oz (104.5 kg)  09/08/20 226 lb (102.5 kg)  08/12/20 228 lb (103.4 kg)    General appearance: alert, cooperative and appears stated age Ears: normal TM's and external ear canals both ears Throat: lips, mucosa, and tongue normal; teeth and gums normal Neck: no adenopathy, no carotid bruit, supple, symmetrical, trachea midline and thyroid not enlarged, symmetric, no tenderness/mass/nodules Back: symmetric, no curvature. ROM normal. No CVA tenderness. Lungs: clear to auscultation bilaterally Heart: regular rate and rhythm, S1, S2 normal, no murmur, click, rub or gallop Abdomen: soft, non-tender; bowel sounds normal; no masses,  no organomegaly Pulses: 2+ and symmetric Skin: Skin color, texture, turgor normal. No rashes or lesions Lymph nodes: Cervical, supraclavicular, and axillary nodes normal.  Lab Results  Component Value Date   HGBA1C 6.6 (H) 08/03/2020   HGBA1C 12.8 (H) 04/29/2020   HGBA1C 6.4 05/29/2019    Lab Results  Component Value Date   CREATININE 1.22 09/08/2020   CREATININE 1.20 08/03/2020   CREATININE 1.31 (H) 06/10/2020    Lab Results  Component Value Date   WBC 6.1 09/08/2020   HGB 15.4 09/08/2020   HCT 43.7 09/08/2020   PLT 373 09/08/2020   GLUCOSE 106 (H) 09/08/2020   CHOL 226 (H) 04/29/2020   TRIG (H) 04/29/2020    497.0 Triglyceride is over 400; calculations on Lipids are invalid.   HDL 38.10 (L) 04/29/2020   LDLDIRECT 89.0 04/29/2020   LDLCALC 119 (H)  09/09/2019   ALT 26 08/03/2020   AST 24 08/03/2020   NA 137 09/08/2020   K 4.3 09/08/2020   CL 101 09/08/2020   CREATININE 1.22 09/08/2020   BUN 13 09/08/2020   CO2 26 09/08/2020   TSH 1.06 04/29/2020   PSA 0.45 04/29/2020   HGBA1C 6.6 (H) 08/03/2020   MICROALBUR 23.4 (H) 04/29/2020    DG Chest 2 View  Result Date: 09/08/2020 CLINICAL DATA:  Chest pain EXAM: CHEST - 2 VIEW COMPARISON:  02/27/2019 FINDINGS: Stable cardiomediastinal contours. No focal airspace consolidation, pleural effusion, or pneumothorax. Osseous structures appear within normal limits. IMPRESSION: No active cardiopulmonary disease. Electronically Signed   By: Davina Poke D.O.   On: 09/08/2020 11:57    Assessment & Plan:   Problem List Items Addressed This Visit       Unprioritized   Chronic kidney disease (CKD), stage II (mild)  Stable.  GFR > 50  Lab Results  Component Value Date   CREATININE 1.22 09/08/2020   Lab Results  Component Value Date   NA 137 09/08/2020   K 4.3 09/08/2020   CL 101 09/08/2020   CO2 26 09/08/2020         Obesity, diabetes, and hypertension syndrome ([place]) - Primary    Improved  glycemic control with addition of Jardiance but with  worsening proteinuria     BP is at goal with ARB. Hyperlipidemia now untreated due to statin myopathy suggested by elevated CK , normal ANA and history of muscle pain ,  With significant improvement in signs and symptoms upon suspension of pravastatin . Not clear if there was an interaction with his HAART therapy.  Will discuss addition of Zetia with Dr. Darnelle Maffucci and also consider adding Ozempic and Repatha after next blood draw  given persistent BMI > 30  Repeat A1c needed on or after October 12   Lab Results  Component Value Date   HGBA1C 6.6 (H) 08/03/2020   Lab Results  Component Value Date   MICROALBUR 23.4 (H) 04/29/2020   MICROALBUR 5.7 (H) 03/11/2019           Relevant Medications   pravastatin (PRAVACHOL) 40 MG tablet    Other Relevant Orders   Hemoglobin A1c   Lipid panel   Comprehensive metabolic panel    Meds ordered this encounter  Medications   Tdap (BOOSTRIX) 5-2.5-18.5 LF-MCG/0.5 injection    Sig: Inject 0.5 mLs into the muscle once for 1 dose.    Dispense:  0.5 mL    Refill:  0    There are no discontinued medications.  Follow-up: Return in about 4 months (around 02/07/2021) for follow up diabetes.   [name], MD

## 2020-10-19 NOTE — Patient Instructions (Signed)
I did not make any changes today,  so please return after October 12 for your fasting labs    If your A1c  is < 7.0, I'll see you in March!

## 2020-10-21 ENCOUNTER — Other Ambulatory Visit: Payer: Self-pay | Admitting: Internal Medicine

## 2020-10-21 DIAGNOSIS — E785 Hyperlipidemia, unspecified: Secondary | ICD-10-CM

## 2020-10-21 NOTE — Telephone Encounter (Signed)
Hi Dr. Candis Schatz, pt returned call. He does not remember the age when his father was diagnosed with colon cancer. He thinks that he could have been in his 64s but he is not sure. He said that the only person that would know would be his mother but she is deceased. Pls advise.

## 2020-10-21 NOTE — Telephone Encounter (Signed)
Called patient to ask left voicemail

## 2020-10-22 ENCOUNTER — Encounter: Payer: Self-pay | Admitting: Gastroenterology

## 2020-10-22 DIAGNOSIS — E1169 Type 2 diabetes mellitus with other specified complication: Secondary | ICD-10-CM | POA: Diagnosis not present

## 2020-10-22 DIAGNOSIS — I152 Hypertension secondary to endocrine disorders: Secondary | ICD-10-CM

## 2020-10-22 DIAGNOSIS — E1159 Type 2 diabetes mellitus with other circulatory complications: Secondary | ICD-10-CM

## 2020-10-22 DIAGNOSIS — N182 Chronic kidney disease, stage 2 (mild): Secondary | ICD-10-CM

## 2020-10-22 DIAGNOSIS — R809 Proteinuria, unspecified: Secondary | ICD-10-CM

## 2020-10-22 DIAGNOSIS — E7800 Pure hypercholesterolemia, unspecified: Secondary | ICD-10-CM

## 2020-10-22 DIAGNOSIS — E669 Obesity, unspecified: Secondary | ICD-10-CM

## 2020-10-22 DIAGNOSIS — E1129 Type 2 diabetes mellitus with other diabetic kidney complication: Secondary | ICD-10-CM | POA: Diagnosis not present

## 2020-10-22 NOTE — Telephone Encounter (Signed)
Colon scheduled on 12/21/20 at 9:30am at [facility]. PV 12/03/20 at 10:00am.

## 2020-10-31 ENCOUNTER — Other Ambulatory Visit: Payer: Self-pay | Admitting: Internal Medicine

## 2020-11-04 ENCOUNTER — Other Ambulatory Visit: Payer: Medicare Other

## 2020-11-04 ENCOUNTER — Ambulatory Visit: Payer: Medicare Other | Admitting: Internal Medicine

## 2020-11-16 ENCOUNTER — Telehealth: Payer: Self-pay | Admitting: Pharmacist

## 2020-11-16 ENCOUNTER — Telehealth: Payer: Medicare Other

## 2020-11-16 NOTE — Telephone Encounter (Signed)
Called patient for phone appointment, he had forgotten and was driving. Also missed lab appointment.   Rescheduled face to face visit with me on Thursday, will plan to have lab work drawn at that time.

## 2020-11-17 ENCOUNTER — Other Ambulatory Visit: Payer: Self-pay | Admitting: Internal Medicine

## 2020-11-17 DIAGNOSIS — M6282 Rhabdomyolysis: Secondary | ICD-10-CM

## 2020-11-18 ENCOUNTER — Ambulatory Visit (INDEPENDENT_AMBULATORY_CARE_PROVIDER_SITE_OTHER): Payer: Medicare Other | Admitting: Pharmacist

## 2020-11-18 ENCOUNTER — Other Ambulatory Visit: Payer: Self-pay

## 2020-11-18 VITALS — BP 127/72 | HR 69

## 2020-11-18 DIAGNOSIS — E1159 Type 2 diabetes mellitus with other circulatory complications: Secondary | ICD-10-CM | POA: Diagnosis not present

## 2020-11-18 DIAGNOSIS — I152 Hypertension secondary to endocrine disorders: Secondary | ICD-10-CM | POA: Diagnosis not present

## 2020-11-18 DIAGNOSIS — E1169 Type 2 diabetes mellitus with other specified complication: Secondary | ICD-10-CM | POA: Diagnosis not present

## 2020-11-18 DIAGNOSIS — E669 Obesity, unspecified: Secondary | ICD-10-CM | POA: Diagnosis not present

## 2020-11-18 DIAGNOSIS — E7800 Pure hypercholesterolemia, unspecified: Secondary | ICD-10-CM

## 2020-11-18 DIAGNOSIS — M6282 Rhabdomyolysis: Secondary | ICD-10-CM

## 2020-11-18 LAB — COMPREHENSIVE METABOLIC PANEL
ALT: 25 U/L (ref 0–53)
AST: 30 U/L (ref 0–37)
Albumin: 4.3 g/dL (ref 3.5–5.2)
Alkaline Phosphatase: 50 U/L (ref 39–117)
BUN: 12 mg/dL (ref 6–23)
CO2: 30 mEq/L (ref 19–32)
Calcium: 9.7 mg/dL (ref 8.4–10.5)
Chloride: 102 mEq/L (ref 96–112)
Creatinine, Ser: 1.13 mg/dL (ref 0.40–1.50)
GFR: 68.87 mL/min (ref >60.00)
Glucose, Bld: 98 mg/dL (ref 70–99)
Potassium: 4.5 mEq/L (ref 3.5–5.1)
Sodium: 137 mEq/L (ref 135–145)
Total Bilirubin: 0.5 mg/dL (ref 0.2–1.2)
Total Protein: 7.9 g/dL (ref 6.0–8.3)

## 2020-11-18 LAB — LIPID PANEL
Cholesterol: 215 mg/dL — ABNORMAL HIGH (ref 0–200)
HDL: 40.7 mg/dL (ref >39.00)
LDL Cholesterol: 139 mg/dL — ABNORMAL HIGH (ref 0–99)
NonHDL: 174.4
Total CHOL/HDL Ratio: 5
Triglycerides: 179 mg/dL — ABNORMAL HIGH (ref 0.0–149.0)
VLDL: 35.8 mg/dL (ref 0.0–40.0)

## 2020-11-18 LAB — CK: Total CK: 459 U/L — ABNORMAL HIGH (ref 7–232)

## 2020-11-18 LAB — HEMOGLOBIN A1C: Hgb A1c MFr Bld: 6 % (ref 4.6–6.5)

## 2020-11-18 MED ORDER — ROSUVASTATIN CALCIUM 5 MG PO TABS: 5.0000 mg | ORAL_TABLET | Freq: Every day | ORAL | 1 refills | Status: DC

## 2020-11-18 NOTE — Chronic Care Management (AMB) (Signed)
Chronic Care Management Pharmacy Note  11/18/2020 Name:  Adam Becker MRN:  [id number] DOB:  02-Apr-1955  Subjective: Adam Becker is an 65 y.o. year old male who is a primary patient of [name], [name], MD.  The CCM team was consulted for assistance with disease management and care coordination needs.    Engaged with patient by telephone for follow up visit for pharmacy case management and/or care coordination services.   Objective:  Medications Reviewed Today     Reviewed by De Hollingshead, RPH-CPP (Pharmacist) on 11/18/20 at 3  Med List Status: <None>   Medication Order Taking? Sig Documenting Provider Last Dose Status Informant  allopurinol (ZYLOPRIM) 100 MG tablet [id number] Yes TAKE 1 TABLET BY MOUTH EVERY DAY [name], MD Taking Active   aspirin EC 81 MG tablet [id number] Yes Take 1 tablet (81 mg total) by mouth daily. [name], FNP Taking Active   blood glucose meter kit and supplies KIT [id number]  Dispense based on patient and insurance preference. Use up to four times daily as directed. [name], MD  Active   cholecalciferol (VITAMIN D3) 25 MCG (1000 UNIT) tablet [id number] Yes Take 1,000 Units by mouth daily. [provider] Taking Active   darunavir (PREZISTA) 800 MG tablet [id number] Yes Take 1 tablet (800 mg total) by mouth daily. [name], [name], MD Taking Active   elvitegravir-cobicistat-emtricitabine-tenofovir (GENVOYA) 150-150-200-10 MG TABS tablet [id number] Yes TAKE 1 TABLET BY MOUTH EVERY DAY WITH BREAKFAST [name], [name], MD Taking Active   empagliflozin (JARDIANCE) 10 MG TABS tablet [id number] Yes Take 1 tablet (10 mg total) by mouth daily before breakfast. [name], MD Taking Active   gabapentin (NEURONTIN) 300 MG capsule [id number] Yes Take 2 capsules (600 mg total) by mouth 3 (three) times daily. [name], MD Taking Active   glucose blood test strip [id number]  Use as instructed to check glucose BID  [name], MD  Active   Lancets [facility] ULTRASOFT) lancets [id number]  Use as instructed to check glucose BID [name], MD  Active   metFORMIN (GLUCOPHAGE) 500 MG tablet [id number] Yes Take 1 tablet QAM and 2 tablets QPM [name], MD Taking Active   rOPINIRole (REQUIP) 1 MG tablet [id number] Yes Take 1.5 mg by mouth 4 (four) times daily. [provider] Taking Active   telmisartan (MICARDIS) 40 MG tablet [id number] Yes TAKE 1 TABLET BY MOUTH EVERYDAY AT BEDTIME [name], MD Taking Active   tiZANidine (ZANAFLEX) 4 MG tablet [id number] No Take 1 tablet (4 mg total) by mouth every 6 (six) hours as needed for muscle spasms.  Patient not taking: Reported on 11/18/2020   [name], MD Not Taking Active   vitamin B-12 (CYANOCOBALAMIN) 1000 MCG tablet [id number] Yes Take 1,000 mcg by mouth daily. [provider] Taking Active            Lab Results  Component Value Date   HGBA1C 6.6 (H) 08/03/2020   Lab Results  Component Value Date   CHOL 226 (H) 04/29/2020   HDL 38.10 (L) 04/29/2020   LDLCALC 119 (H) 09/09/2019   LDLDIRECT 89.0 04/29/2020   TRIG (H) 04/29/2020    497.0 Triglyceride is over 400; calculations on Lipids are invalid.   CHOLHDL 6 04/29/2020   Lab Results  Component Value Date   ALT 26 08/03/2020   AST 24 08/03/2020   ALKPHOS 49 08/03/2020   BILITOT 0.5 08/03/2020  Lab Results  Component Value Date   CREATININE 1.22 09/08/2020   BUN 13 09/08/2020   NA 137 09/08/2020   K 4.3 09/08/2020   CL 101 09/08/2020   CO2 26 09/08/2020   Lab Results  Component Value Date   CKTOTAL 268 (H) 08/03/2020   CKMB 2.7 11/26/2011   TROPONINI < 0.02 11/26/2011     Assessment/Interventions: Review of patient past medical history, allergies, medications, health status, including review of consultants reports, laboratory and other test data, was performed as part of comprehensive evaluation and provision of chronic care management  services.   SDOH:  (Social Determinants of Health) assessments and interventions performed: Yes SDOH Interventions    Flowsheet Row Most Recent Value  SDOH Interventions   Financial Strain Interventions Intervention Not Indicated        CCM Care Plan  Review of patient past medical history, allergies, medications, health status, including review of consultants reports, laboratory and other test data, was performed as part of comprehensive evaluation and provision of chronic care management services.   Conditions to be addressed/monitored:  Hypertension, Hyperlipidemia, and Diabetes  Care Plan : Medication Management  Updates made by De Hollingshead, RPH-CPP since 11/18/2020 12:00 AM     Problem: Diabetes, HTN, HLD      Long-Range Goal: Disease Progression Prevention   Start Date: 05/13/2020  Recent Progress: On track  Priority: High  Note:   Current Barriers:  Unable to achieve control of diabetes   Pharmacist Clinical Goal(s):  Over the next 90 days, patient will achieve control of diabetes as evidenced by A1c through collaboration with PharmD and provider.   Interventions: 1:1 collaboration with [name], MD regarding development and update of comprehensive plan of care as evidenced by provider attestation and co-signature Inter-disciplinary care team collaboration (see longitudinal plan of care) Comprehensive medication review performed; medication list updated in electronic medical record  Health Maintenance Yearly diabetic eye exam: up to date Yearly diabetic foot exam: up to date Urine microalbumin: up to date Yearly influenza vaccination: up to date  Td/Tdap vaccination: up to date Pneumonia vaccination: up to date COVID vaccinations: due - recommended bivalent booster at his local pharmacy Shingrix vaccinations: up to date - reports he received once dose but doesn't remember the date.  Colonoscopy: up to date  Diabetes: Controlled; current  treatment: metformin 500 mg QAM, 1000 mg QPM, Jardiance 10 mg daily Current glucose readings: fasting glucose: 110-137; generally less than 130 Current meal patterns: breakfast: skip; lunch: light, eats out -salmon, salad; tuna casserole, chicken casserole with some vegetables; supper: typically splits meal to make leftovers, often eating more at night; snacking: tries to avoid buying; drinks: water Current exercise: tries to walk on treadmill in the morning, has incorporated weights every other day Reviewed goal A1c, goal fasting, goal 2 hour post prandial glucose.   Discussed addition of GLP1 for weight loss moving forward, pending A1c.  Recommended to continue current regimen at this time. Follow lab results.   Hypertension: Controlled per office reading today; current treatment: telmisartan 40 mg daily Current home readings: 128-135s/70-80s, though cuff was previously compared and read higher Recommended to continue current regimen at this time. Educated on goal BP.   Hyperlipidemia and ASCVD risk reduction: Uncontrolled; current treatment: none Antiplatelet regimen: aspirin 81 mg daily Previous regimen: muscle aches w/ pravastatin w/ CK elevation DDI with ART therapy and several statins (Genvoya can increase concentrations of lovastatin, simvastatin, and atorvastatin; Prezista can increase concentrations of lovastatin, simvastatin,  atorvastatin, pravastatin, and rosuvastatin). 10 year ASCVD risk >30%, as well as having DM and risk enhancing factor of HIV.  Discussed with PCP. Will recheck CK today to ensure it is back at baseline. Pending normal results, start rosuvastatin 5 mg daily. Follow closely for symptoms of muscle pains.   HIV: Managed by ID; current regimen: Prezista 800 mg daily, Genvoya 150/150/200/10 mg  Recommended to continue current regimen at this time along with ID collaboration.  Peripheral Neuropathy; Moderately well managed; current regimen: gabapentin 600 mg TID,  ropinirole 1.5 mg QID; Follows w/ neurology Dr. Manuella Ghazi Ferritin at goal.  Recommended to continue current regimen at this time along with neurology collaboration.   Gout: Controlled; current regimen: allopurinol 100 mg daily Last uric acid at goal Recommended to continue current regimen at this time  GERD: Moderately well controlled, reports he takes OTC omeprazole daily. Darunavir may decrease concentrations of omeprazole. Advised to trial famotidine PRN instead, given lack of drug interactions  Patient Goals/Self-Care Activities Over the next 90 days, patient will:  - take medications as prescribed check glucose twice daily, document, and provide at future appointments engage in dietary modifications by reducing carbohydrate portion sizes  Follow Up Plan: Telephone follow up appointment with care management team member scheduled for: ~ 12 weeks - PCP in 6      Plan: Telephone follow up appointment with care management team member scheduled for:  12 weeks  Catie Darnelle Maffucci, PharmD, [place], [place] Pharmacist [employer] [facility] [employer] [phone]

## 2020-11-18 NOTE — Patient Instructions (Signed)
It was great to see you today!  Let's see what your A1c is. Continue metformin 500 mg in the morning and 1000 mg in the evening, along with the Jardiance 10 mg daily.   Moving forward, we do have medications for diabetes that can help with weight loss, the medication we discussed was Ozempic. It is a weekly injectable. Let us know if you would be interested in that moving forward.   Check your blood sugars twice daily:  1) Fasting, first thing in the morning before breakfast and  2) 2 hours after your largest meal.   For a goal A1c of less than 7%, goal fasting readings are less than 130 and goal 2 hour after meal readings are less than 180.   Keep checking blood pressure readings at home. Our goal blood pressure is less than 130/80.   Let's try a different medication for cholesterol and prevention of heart disease. Start rosuvastatin 5 mg daily. You can take this in the mornings or the evenings. Please stop the medication and contact us with any development of muscle pains like before.   Take care!  Catie Darnelle Maffucci, PharmD  Visit Information  PATIENT GOALS:  Goals Addressed               This Visit's Progress     Patient Stated     Medication Management (pt-stated)        Patient Goals/Self-Care Activities Over the next 90 days, patient will:  - take medications as prescribed check glucose twice daily, document, and provide at future appointments engage in dietary modifications by reducing carbohydrate portion sizes         Patient verbalizes understanding of instructions provided today and agrees to view in [place].   Plan: Telephone follow up appointment with care management team member scheduled for:  12 weeks  Catie Darnelle Maffucci, PharmD, [place], [place] Clinical Pharmacist [employer] [facility] [employer] [phone]

## 2020-11-19 ENCOUNTER — Ambulatory Visit: Payer: Medicare Other | Admitting: Pharmacist

## 2020-11-19 DIAGNOSIS — E7800 Pure hypercholesterolemia, unspecified: Secondary | ICD-10-CM

## 2020-11-19 DIAGNOSIS — N182 Chronic kidney disease, stage 2 (mild): Secondary | ICD-10-CM

## 2020-11-19 DIAGNOSIS — E1129 Type 2 diabetes mellitus with other diabetic kidney complication: Secondary | ICD-10-CM

## 2020-11-19 DIAGNOSIS — R809 Proteinuria, unspecified: Secondary | ICD-10-CM

## 2020-11-19 DIAGNOSIS — M6282 Rhabdomyolysis: Secondary | ICD-10-CM

## 2020-11-19 NOTE — Patient Instructions (Signed)
Visit Information  PATIENT GOALS:  Goals Addressed               This Visit's Progress     Patient Stated     Medication Management (pt-stated)        Patient Goals/Self-Care Activities Over the next 90 days, patient will:  - take medications as prescribed check glucose twice daily, document, and provide at future appointments engage in dietary modifications by reducing carbohydrate portion sizes        Patient verbalizes understanding of instructions provided today and agrees to view in [place].   Plan: Telephone follow up appointment with care management team member scheduled for:  12 weeks as previously scheduled   Catie Darnelle Maffucci, PharmD, [place], [place] Clinical Pharmacist [employer] [facility] [employer] [phone]

## 2020-11-19 NOTE — Chronic Care Management (AMB) (Signed)
Chronic Care Management Pharmacy Note  11/19/2020 Name:  Adam Becker MRN:  [id number] DOB:  Sep 23, 1955  Subjective: Adam Becker is an 65 y.o. year old male who is a primary patient of [name], [name], MD.  The CCM team was consulted for assistance with disease management and care coordination needs.    Engaged with patient by telephone for  lab results  for pharmacy case management and/or care coordination services.   Objective:  Medications Reviewed Today     Reviewed by De Hollingshead, RPH-CPP (Pharmacist) on 11/18/20 at 94  Med List Status: <None>   Medication Order Taking? Sig Documenting Provider Last Dose Status Informant  allopurinol (ZYLOPRIM) 100 MG tablet [id number] Yes TAKE 1 TABLET BY MOUTH EVERY DAY [name], MD Taking Active   aspirin EC 81 MG tablet [id number] Yes Take 1 tablet (81 mg total) by mouth daily. [name], FNP Taking Active   blood glucose meter kit and supplies KIT [id number]  Dispense based on patient and insurance preference. Use up to four times daily as directed. [name], MD  Active   cholecalciferol (VITAMIN D3) 25 MCG (1000 UNIT) tablet [id number] Yes Take 1,000 Units by mouth daily. [provider] Taking Active   darunavir (PREZISTA) 800 MG tablet [id number] Yes Take 1 tablet (800 mg total) by mouth daily. [name], [name], MD Taking Active   elvitegravir-cobicistat-emtricitabine-tenofovir (GENVOYA) 150-150-200-10 MG TABS tablet [id number] Yes TAKE 1 TABLET BY MOUTH EVERY DAY WITH BREAKFAST [name], [name], MD Taking Active   empagliflozin (JARDIANCE) 10 MG TABS tablet [id number] Yes Take 1 tablet (10 mg total) by mouth daily before breakfast. [name], MD Taking Active   gabapentin (NEURONTIN) 300 MG capsule [id number] Yes Take 2 capsules (600 mg total) by mouth 3 (three) times daily. [name], MD Taking Active   glucose blood test strip [id number]  Use as instructed to check glucose BID  [name], MD  Active   Lancets [facility] ULTRASOFT) lancets [id number]  Use as instructed to check glucose BID [name], MD  Active   metFORMIN (GLUCOPHAGE) 500 MG tablet [id number] Yes Take 1 tablet QAM and 2 tablets QPM [name], MD Taking Active   rOPINIRole (REQUIP) 1 MG tablet [id number] Yes Take 1.5 mg by mouth 4 (four) times daily. [provider] Taking Active   telmisartan (MICARDIS) 40 MG tablet [id number] Yes TAKE 1 TABLET BY MOUTH EVERYDAY AT BEDTIME [name], MD Taking Active   tiZANidine (ZANAFLEX) 4 MG tablet [id number] No Take 1 tablet (4 mg total) by mouth every 6 (six) hours as needed for muscle spasms.  Patient not taking: Reported on 11/18/2020   [name], MD Not Taking Active   vitamin B-12 (CYANOCOBALAMIN) 1000 MCG tablet [id number] Yes Take 1,000 mcg by mouth daily. [provider] Taking Active            Lab Results  Component Value Date   CHOL 215 (H) 11/18/2020   HDL 40.70 11/18/2020   LDLCALC 139 (H) 11/18/2020   LDLDIRECT 89.0 04/29/2020   TRIG 179.0 (H) 11/18/2020   CHOLHDL 5 11/18/2020   Lab Results  Component Value Date   XVQMGQQ 761 (H) 11/18/2020   CKMB 2.7 11/26/2011   TROPONINI < 0.02 11/26/2011   Lab Results  Component Value Date   HGBA1C 6.0 11/18/2020     Assessment/Interventions: Review of patient past medical history, allergies, medications, health status, including  review of consultants reports, laboratory and other test data, was performed as part of comprehensive evaluation and provision of chronic care management services.   SDOH:  (Social Determinants of Health) assessments and interventions performed: Yes SDOH Interventions    Flowsheet Row Most Recent Value  SDOH Interventions   Financial Strain Interventions Intervention Not Indicated        CCM Care Plan  Review of patient past medical history, allergies, medications, health status, including review of consultants  reports, laboratory and other test data, was performed as part of comprehensive evaluation and provision of chronic care management services.   Conditions to be addressed/monitored:  Hypertension, Hyperlipidemia, and Diabetes  Care Plan : Medication Management  Updates made by De Hollingshead, RPH-CPP since 11/19/2020 12:00 AM     Problem: Diabetes, HTN, HLD      Long-Range Goal: Disease Progression Prevention   Start Date: 05/13/2020  Recent Progress: On track  Priority: High  Note:   Current Barriers:  Unable to achieve control of diabetes   Pharmacist Clinical Goal(s):  Over the next 90 days, patient will achieve control of diabetes as evidenced by A1c through collaboration with PharmD and provider.   Interventions: 1:1 collaboration with [name], MD regarding development and update of comprehensive plan of care as evidenced by provider attestation and co-signature Inter-disciplinary care team collaboration (see longitudinal plan of care) Comprehensive medication review performed; medication list updated in electronic medical record  Health Maintenance Yearly diabetic eye exam: up to date Yearly diabetic foot exam: up to date Urine microalbumin: up to date Yearly influenza vaccination: up to date  Td/Tdap vaccination: up to date Pneumonia vaccination: up to date COVID vaccinations: due - recommended bivalent booster at his local pharmacy Shingrix vaccinations: up to date - reports he received once dose but doesn't remember the date.  Colonoscopy: up to date  Diabetes: Controlled; current treatment: metformin 500 mg QAM, 1000 mg QPM, Jardiance 10 mg daily Previously recommended to continue current regimen at this time  Hypertension: Controlled per office reading today; current treatment: telmisartan 40 mg daily Previously recommended to continue current regimen at this time. Educated on goal BP.   Hyperlipidemia and ASCVD risk reduction: Uncontrolled;  current treatment: none Antiplatelet regimen: aspirin 81 mg daily Previous regimen: muscle aches w/ pravastatin w/ CK elevation DDI with ART therapy and several statins (Genvoya can increase concentrations of lovastatin, simvastatin, and atorvastatin; Prezista can increase concentrations of lovastatin, simvastatin, atorvastatin, pravastatin, and rosuvastatin). 10 year ASCVD risk >30%, as well as having DM and risk enhancing factor of HIV.  CK elevation still present even off statin therapy. Called patient, advised to NOT start rosuvastatin. Called [facility] and cancelled refills. Patient scheduled for follow up with PCP.  HIV: Managed by ID; current regimen: Prezista 800 mg daily, Genvoya 150/150/200/10 mg  Recommended to continue current regimen at this time along with ID collaboration.  Peripheral Neuropathy; Moderately well managed; current regimen: gabapentin 600 mg TID, ropinirole 1.5 mg QID; Follows w/ neurology Dr. Manuella Ghazi Ferritin at goal.  Recommended to continue current regimen at this time along with neurology collaboration.   Gout: Controlled; current regimen: allopurinol 100 mg daily Last uric acid at goal Recommended to continue current regimen at this time  GERD: Moderately well controlled, reports he takes OTC omeprazole daily. Darunavir may decrease concentrations of omeprazole. Advised to trial famotidine PRN instead, given lack of drug interactions  Patient Goals/Self-Care Activities Over the next 90 days, patient will:  - take medications as  prescribed check glucose twice daily, document, and provide at future appointments engage in dietary modifications by reducing carbohydrate portion sizes  Follow Up Plan: Telephone follow up appointment with care management team member scheduled for: ~ 12 weeks - PCP in 6      Plan: Telephone follow up appointment with care management team member scheduled for:  12 weeks as previously scheduled  Catie Darnelle Maffucci, PharmD, [place],  [place] Pharmacist [employer] at [employer] [phone]

## 2020-11-21 DIAGNOSIS — G4733 Obstructive sleep apnea (adult) (pediatric): Secondary | ICD-10-CM | POA: Diagnosis not present

## 2020-11-22 DIAGNOSIS — E7800 Pure hypercholesterolemia, unspecified: Secondary | ICD-10-CM

## 2020-11-22 DIAGNOSIS — N182 Chronic kidney disease, stage 2 (mild): Secondary | ICD-10-CM | POA: Diagnosis not present

## 2020-11-22 DIAGNOSIS — E1169 Type 2 diabetes mellitus with other specified complication: Secondary | ICD-10-CM

## 2020-11-22 DIAGNOSIS — R809 Proteinuria, unspecified: Secondary | ICD-10-CM | POA: Diagnosis not present

## 2020-11-22 DIAGNOSIS — E1129 Type 2 diabetes mellitus with other diabetic kidney complication: Secondary | ICD-10-CM

## 2020-11-22 DIAGNOSIS — I152 Hypertension secondary to endocrine disorders: Secondary | ICD-10-CM | POA: Diagnosis not present

## 2020-11-22 DIAGNOSIS — E1159 Type 2 diabetes mellitus with other circulatory complications: Secondary | ICD-10-CM

## 2020-11-22 DIAGNOSIS — E669 Obesity, unspecified: Secondary | ICD-10-CM

## 2020-12-03 ENCOUNTER — Ambulatory Visit (AMBULATORY_SURGERY_CENTER): Payer: Medicare Other

## 2020-12-03 VITALS — Ht 71.0 in | Wt 225.0 lb

## 2020-12-03 DIAGNOSIS — Z800 Family history of malignant neoplasm of digestive organs: Secondary | ICD-10-CM

## 2020-12-03 MED ORDER — SUTAB 1479-225-188 MG PO TABS: 12.0000 | ORAL_TABLET | ORAL | 0 refills | Status: DC

## 2020-12-03 NOTE — Progress Notes (Signed)
No egg or soy allergy known to patient  No issues known to pt with past sedation with any surgeries or procedures Patient denies ever being told they had issues or difficulty with intubation  No FH of Malignant Hyperthermia Pt is not on diet pills Pt is not on  home 02  Pt is not on blood thinners  Pt denies issues with constipation  No A fib or A flutter  Pt is fully vaccinated  for Covid     NO PA's for preps discussed with pt In [place] today  Discussed with pt there will be an out-of-pocket cost for prep and that varies from $0 to 70 +  dollars - pt verbalized understanding   Due to the COVID-19 pandemic we are asking patients to follow certain guidelines in [place] and the [place]   Pt aware of COVID protocols and [facility] guidelines

## 2020-12-07 ENCOUNTER — Ambulatory Visit: Payer: Medicare Other | Admitting: Internal Medicine

## 2020-12-14 ENCOUNTER — Other Ambulatory Visit: Payer: Self-pay

## 2020-12-14 ENCOUNTER — Ambulatory Visit (INDEPENDENT_AMBULATORY_CARE_PROVIDER_SITE_OTHER): Payer: Medicare Other | Admitting: Internal Medicine

## 2020-12-14 ENCOUNTER — Encounter: Payer: Self-pay | Admitting: Internal Medicine

## 2020-12-14 VITALS — BP 117/83 | HR 87 | Temp 98.6°F | Ht 71.0 in | Wt 231.4 lb

## 2020-12-14 DIAGNOSIS — E1169 Type 2 diabetes mellitus with other specified complication: Secondary | ICD-10-CM | POA: Diagnosis not present

## 2020-12-14 DIAGNOSIS — E7800 Pure hypercholesterolemia, unspecified: Secondary | ICD-10-CM | POA: Diagnosis not present

## 2020-12-14 DIAGNOSIS — E1159 Type 2 diabetes mellitus with other circulatory complications: Secondary | ICD-10-CM

## 2020-12-14 DIAGNOSIS — I152 Hypertension secondary to endocrine disorders: Secondary | ICD-10-CM

## 2020-12-14 DIAGNOSIS — E669 Obesity, unspecified: Secondary | ICD-10-CM

## 2020-12-14 DIAGNOSIS — M791 Myalgia, unspecified site: Secondary | ICD-10-CM | POA: Diagnosis not present

## 2020-12-14 DIAGNOSIS — R748 Abnormal levels of other serum enzymes: Secondary | ICD-10-CM

## 2020-12-14 NOTE — Assessment & Plan Note (Signed)
Excellent  glycemic control with addition of Jardiance but with  worsening proteinuria     BP is at goal with ARB. 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 Not clear if there was an interaction with his HAART therapy.  Lab Results  Component Value Date   HGBA1C 6.0 11/18/2020   Lab Results  Component Value Date   MICROALBUR 23.4 (H) 04/29/2020   MICROALBUR 5.7 (H) 03/11/2019

## 2020-12-14 NOTE — Progress Notes (Signed)
Subjective:  Patient ID: Adam Becker, male    DOB: 10/29/1955  Age: 65 y.o. MRN: [id number]  CC: The primary encounter diagnosis was Elevated CK. Diagnoses of Pure hypercholesterolemia, Muscle pain, and Obesity, diabetes, and hypertension syndrome ([place]) were also pertinent to this visit.  HPI Adam Becker presents for follow up on elevated ck,  type 2 DM  with neuropathy  and hypertension .  This visit occurred during the SARS-CoV-2 public health emergency.  Safety protocols were in place, including screening questions prior to the visit, additional usage of staff PPE, and extensive cleaning of exam room while observing appropriate contact time as indicated for disinfecting solutions.   HTN:  elevated today. He is currently in mild pain from a tooth extraction 5 days ago  Home readings have been on telmisartan   Increased CK:  first noted in April when patient reported muscle cramps in the calf.   CK Elevation  improved steadily after stopping statin, and cramping improved,  but most recently CK had increased again .  His leg cramps improved in frequency until 2 nights ago when he was woken up by a left calf cramp ,  but he had none last night . 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 Has not discussed with Dr Drucilla Schmidt.      Outpatient Medications Prior to Visit  Medication Sig Dispense Refill   allopurinol (ZYLOPRIM) 100 MG tablet TAKE 1 TABLET BY MOUTH EVERY DAY 90 tablet 1   aspirin EC 81 MG tablet Take 1 tablet (81 mg total) by mouth daily. 90 tablet 3   blood glucose meter kit and supplies KIT Dispense based on patient and insurance preference. Use up to four times daily as directed. 1 each 11   cholecalciferol (VITAMIN D3) 25 MCG (1000 UNIT) tablet Take 1,000 Units by mouth daily.     darunavir (PREZISTA) 800 MG tablet Take 1 tablet (800 mg total) by mouth daily. 60 tablet 11    elvitegravir-cobicistat-emtricitabine-tenofovir (GENVOYA) 150-150-200-10 MG TABS tablet TAKE 1 TABLET BY MOUTH EVERY DAY WITH BREAKFAST 30 tablet 11   empagliflozin (JARDIANCE) 10 MG TABS tablet Take 1 tablet (10 mg total) by mouth daily before breakfast. 30 tablet 11   famotidine (PEPCID) 10 MG tablet Take 10 mg by mouth 2 (two) times daily as needed for heartburn or indigestion.     gabapentin (NEURONTIN) 300 MG capsule Take 2 capsules (600 mg total) by mouth 3 (three) times daily. 180 capsule 5   glucose blood test strip Use as instructed to check glucose BID 100 each 12   Lancets (ONETOUCH ULTRASOFT) lancets Use as instructed to check glucose BID 100 each 12   metFORMIN (GLUCOPHAGE) 500 MG tablet Take 1 tablet QAM and 2 tablets QPM 270 tablet 3   rOPINIRole (REQUIP) 1 MG tablet Take 1.5 mg by mouth 4 (four) times daily.     SUTAB [id number] MG TABS Take 12 tablets by mouth as directed. 24 tablet 0   telmisartan (MICARDIS) 40 MG tablet TAKE 1 TABLET BY MOUTH EVERYDAY AT BEDTIME 90 tablet 1   tiZANidine (ZANAFLEX) 4 MG tablet Take 1 tablet (4 mg total) by mouth every 6 (six) hours as needed for muscle spasms. 30 tablet 0   vitamin B-12 (CYANOCOBALAMIN) 1000 MCG tablet Take 1,000 mcg by mouth daily.     No facility-administered medications prior to visit.    Review of  Systems;  Patient denies headache, fevers, malaise, unintentional weight loss, skin rash, eye pain, sinus congestion and sinus pain, sore throat, dysphagia,  hemoptysis , cough, dyspnea, wheezing, chest pain, palpitations, orthopnea, edema, abdominal pain, nausea, melena, diarrhea, constipation, flank pain, dysuria, hematuria, urinary  Frequency, nocturia, numbness, tingling, seizures,  Focal weakness, Loss of consciousness,  Tremor, insomnia, depression, anxiety, and suicidal ideation.      Objective:  BP 117/83   Pulse 87   Temp 98.6 F (37 C) (Oral)   Ht _0  (1.803 m)   Wt 231 lb 6.4 oz (105 kg)   SpO2 98%   BMI  32.27 kg/m   BP Readings from Last 3 Encounters:  12/14/20 117/83  11/18/20 127/72  10/19/20 122/76    Wt Readings from Last 3 Encounters:  12/14/20 231 lb 6.4 oz (105 kg)  12/03/20 225 lb (102.1 kg)  10/19/20 230 lb 6.4 oz (104.5 kg)    General appearance: alert, cooperative and appears stated age Ears: normal TM's and external ear canals both ears Throat: lips, mucosa, and tongue normal; teeth and gums normal Neck: no adenopathy, no carotid bruit, supple, symmetrical, trachea midline and thyroid not enlarged, symmetric, no tenderness/mass/nodules Back: symmetric, no curvature. ROM normal. No CVA tenderness. Lungs: clear to auscultation bilaterally Heart: regular rate and rhythm, S1, S2 normal, no murmur, click, rub or gallop Abdomen: soft, non-tender; bowel sounds normal; no masses,  no organomegaly Pulses: 2+ and symmetric Skin: Skin color, texture, turgor normal. No rashes or lesions Lymph nodes: Cervical, supraclavicular, and axillary nodes normal.  Lab Results  Component Value Date   HGBA1C 6.0 11/18/2020   HGBA1C 6.6 (H) 08/03/2020   HGBA1C 12.8 (H) 04/29/2020    Lab Results  Component Value Date   CREATININE 1.13 11/18/2020   CREATININE 1.22 09/08/2020   CREATININE 1.20 08/03/2020    Lab Results  Component Value Date   WBC 6.1 09/08/2020   HGB 15.4 09/08/2020   HCT 43.7 09/08/2020   PLT 373 09/08/2020   GLUCOSE 98 11/18/2020   CHOL 215 (H) 11/18/2020   TRIG 179.0 (H) 11/18/2020   HDL 40.70 11/18/2020   LDLDIRECT 89.0 04/29/2020   LDLCALC 139 (H) 11/18/2020   ALT 25 11/18/2020   AST 30 11/18/2020   NA 137 11/18/2020   K 4.5 11/18/2020   CL 102 11/18/2020   CREATININE 1.13 11/18/2020   BUN 12 11/18/2020   CO2 30 11/18/2020   TSH 1.06 04/29/2020   PSA 0.45 04/29/2020   HGBA1C 6.0 11/18/2020   MICROALBUR 23.4 (H) 04/29/2020    DG Chest 2 View  Result Date: 09/08/2020 CLINICAL DATA:  Chest pain EXAM: CHEST - 2 VIEW COMPARISON:  02/27/2019  FINDINGS: Stable cardiomediastinal contours. No focal airspace consolidation, pleural effusion, or pneumothorax. Osseous structures appear within normal limits. IMPRESSION: No active cardiopulmonary disease. Electronically Signed   By: Davina Poke D.O.   On: 09/08/2020 11:57    Assessment & Plan:   Problem List Items Addressed This Visit     Hyperlipidemia    Managed with pravastatin since 2014.  Stopped due to CK elevation noted in April during follow up report of muscle weakness and calf cramps      Obesity, diabetes, and hypertension syndrome ([facility])    Excellent  glycemic control with addition of Jardiance but with  worsening proteinuria     BP is at goal with ARB. 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 Not clear if there was an interaction with his HAART therapy.  Lab Results  Component Value Date   HGBA1C 6.0 11/18/2020   Lab Results  Component Value Date   MICROALBUR 23.4 (H) 04/29/2020   MICROALBUR 5.7 (H) 03/11/2019           Elevated CK - Primary    Attributed initially to statin use; however his CK has remained elevated after 6 months of discontinuation.  Steward Drone may be the cause .  Will discuss with his ID specialist,  Dr [name] about changing therapy vs muscle biopsy   Lab Results  Component Value Date   CKTOTAL 459 (H) 11/18/2020   CKMB 2.7 11/26/2011   TROPONINI < 0.02 11/26/2011        Relevant Orders   CK (Creatine Kinase)   Comprehensive metabolic panel   Other Visit Diagnoses     Muscle pain       Relevant Orders   Sedimentation rate   C-reactive protein       I spent 30 minutes dedicated to the care of this patient on the date of this encounter to include pre-visit review of her medical history,  most recent imaging studies, Face-to-face time with the patient , and post visit ordering of testing and therapeutics.   Follow-up: No follow-ups on  file.   [name], MD

## 2020-12-14 NOTE — Assessment & Plan Note (Signed)
Managed with pravastatin since 2014.  Stopped due to CK elevation noted in April during follow up report of muscle weakness and calf cramps

## 2020-12-14 NOTE — Assessment & Plan Note (Signed)
Attributed initially to statin use; however his CK has remained elevated after 6 months of discontinuation.  Steward Drone may be the cause .  Will discuss with his ID specialist,  Dr [name] about changing therapy vs muscle biopsy   Lab Results  Component Value Date   CKTOTAL 459 (H) 11/18/2020   CKMB 2.7 11/26/2011   TROPONINI < 0.02 11/26/2011

## 2020-12-14 NOTE — Patient Instructions (Signed)
Do NOT resume any cholesterol medications yet!  I think the Gentoya may be responsible for the muscle enzyme elevation , so I will talk to Dr Drucilla Schmidt about it

## 2020-12-15 DIAGNOSIS — Z20828 Contact with and (suspected) exposure to other viral communicable diseases: Secondary | ICD-10-CM | POA: Diagnosis not present

## 2020-12-15 LAB — COMPREHENSIVE METABOLIC PANEL
ALT: 17 U/L (ref 0–53)
AST: 19 U/L (ref 0–37)
Albumin: 4.3 g/dL (ref 3.5–5.2)
Alkaline Phosphatase: 53 U/L (ref 39–117)
BUN: 14 mg/dL (ref 6–23)
CO2: 30 mEq/L (ref 19–32)
Calcium: 9.5 mg/dL (ref 8.4–10.5)
Chloride: 102 mEq/L (ref 96–112)
Creatinine, Ser: 1.18 mg/dL (ref 0.40–1.50)
GFR: 65.35 mL/min (ref >60.00)
Glucose, Bld: 92 mg/dL (ref 70–99)
Potassium: 4.3 mEq/L (ref 3.5–5.1)
Sodium: 139 mEq/L (ref 135–145)
Total Bilirubin: 0.3 mg/dL (ref 0.2–1.2)
Total Protein: 7.8 g/dL (ref 6.0–8.3)

## 2020-12-15 LAB — SEDIMENTATION RATE: Sed Rate: 15 mm/hr (ref 0–20)

## 2020-12-15 LAB — CK: Total CK: 232 U/L (ref 7–232)

## 2020-12-15 LAB — C-REACTIVE PROTEIN: CRP: <1 mg/dL (ref 0.5–20.0)

## 2020-12-18 ENCOUNTER — Encounter: Payer: Self-pay | Admitting: Internal Medicine

## 2020-12-20 ENCOUNTER — Encounter: Payer: Self-pay | Admitting: Certified Registered Nurse Anesthetist

## 2020-12-21 ENCOUNTER — Ambulatory Visit (AMBULATORY_SURGERY_CENTER): Payer: Medicare Other | Admitting: Gastroenterology

## 2020-12-21 ENCOUNTER — Encounter: Payer: Self-pay | Admitting: Gastroenterology

## 2020-12-21 VITALS — BP 125/80 | HR 68 | Resp 15

## 2020-12-21 DIAGNOSIS — Z800 Family history of malignant neoplasm of digestive organs: Secondary | ICD-10-CM | POA: Diagnosis not present

## 2020-12-21 DIAGNOSIS — Z86010 Personal history of colonic polyps: Secondary | ICD-10-CM

## 2020-12-21 DIAGNOSIS — K5730 Diverticulosis of large intestine without perforation or abscess without bleeding: Secondary | ICD-10-CM | POA: Diagnosis not present

## 2020-12-21 DIAGNOSIS — K640 First degree hemorrhoids: Secondary | ICD-10-CM | POA: Diagnosis not present

## 2020-12-21 MED ORDER — SODIUM CHLORIDE 0.9 % IV SOLN
500.0000 mL | INTRAVENOUS | Status: DC
Start: 2020-12-21 — End: 2020-12-21

## 2020-12-21 NOTE — Progress Notes (Signed)
Report given to PACU, vss 

## 2020-12-21 NOTE — Op Note (Signed)
[place] Patient Name: Adam Becker Procedure Date: 12/21/2020 9:45 AM MRN: [id number] Endoscopist: Nicki Reaper E. Candis Schatz , MD Age: 65 Referring MD:  Date of Birth: 1955/06/30 Gender: Male Account #: [id number] Procedure:                Colonoscopy Indications:              Screening in patient at increased risk: Colorectal                            cancer in father 74 or older Medicines:                Monitored Anesthesia Care Procedure:                Pre-Anesthesia Assessment:                           - Prior to the procedure, a History and Physical                            was performed, and patient medications and                            allergies were reviewed. The patient's tolerance of                            previous anesthesia was also reviewed. The risks                            and benefits of the procedure and the sedation                            options and risks were discussed with the patient.                            All questions were answered, and informed consent                            was obtained. Prior Anticoagulants: The patient has                            taken no previous anticoagulant or antiplatelet                            agents. ASA Grade Assessment: II - A patient with                            mild systemic disease. After reviewing the risks                            and benefits, the patient was deemed in                            satisfactory condition to undergo the procedure.  After obtaining informed consent, the colonoscope                            was passed under direct vision. Throughout the                            procedure, the patient's blood pressure, pulse, and                            oxygen saturations were monitored continuously. The                            Colonoscope was introduced through the anus and                            advanced to the the cecum,  identified by                            appendiceal orifice and ileocecal valve. The                            colonoscopy was performed without difficulty. The                            patient tolerated the procedure well. The quality                            of the bowel preparation was adequate. The                            ileocecal valve, appendiceal orifice, and rectum                            were photographed. The bowel preparation used was                            Sutab via split dose instruction. Scope In: 9:59:22 AM Scope Out: 10:14:06 AM Scope Withdrawal Time: 0 hours 11 minutes 20 seconds  Total Procedure Duration: 0 hours 14 minutes 44 seconds  Findings:                 The perianal and digital rectal examinations were                            normal. Pertinent negatives include normal                            sphincter tone and no palpable rectal lesions.                           A few small and large-mouthed diverticula were                            found in the sigmoid colon, descending colon and  ascending colon. There was no evidence of                            diverticular bleeding.                           The exam was otherwise normal throughout the                            examined colon.                           Non-bleeding internal hemorrhoids were found during                            retroflexion. The hemorrhoids were Grade I                            (internal hemorrhoids that do not prolapse).                           No additional abnormalities were found on                            retroflexion. Complications:            No immediate complications. Estimated Blood Loss:     Estimated blood loss: none. Impression:               - Mild diverticulosis in the sigmoid colon, in the                            descending colon and in the ascending colon. There                            was no evidence of  diverticular bleeding.                           - Non-bleeding internal hemorrhoids.                           - No specimens collected. Recommendation:           - Patient has a contact number available for                            emergencies. The signs and symptoms of potential                            delayed complications were discussed with the                            patient. Return to normal activities tomorrow.                            Written discharge instructions were provided to the  patient.                           - Resume previous diet.                           - Continue present medications.                           - Repeat colonoscopy in 10 years for screening                            purposes. Assia Meanor E. Candis Schatz, MD 12/21/2020 10:20:03 AM This report has been signed electronically.

## 2020-12-21 NOTE — Patient Instructions (Signed)
Handouts on diverticulosis and hemorrhoids given to patient. No repeat colonoscopy for screening purposes for 10 years!! Resume previous diet and continue present medications.   YOU HAD AN ENDOSCOPIC PROCEDURE TODAY AT [facility]:   Refer to the procedure report that was given to you for any specific questions about what was found during the examination.  If the procedure report does not answer your questions, please call your gastroenterologist to clarify.  If you requested that your care partner not be given the details of your procedure findings, then the procedure report has been included in a sealed envelope for you to review at your convenience later.  YOU SHOULD EXPECT: Some feelings of bloating in the abdomen. Passage of more gas than usual.  Walking can help get rid of the air that was put into your GI tract during the procedure and reduce the bloating. If you had a lower endoscopy (such as a colonoscopy or flexible sigmoidoscopy) you may notice spotting of blood in your stool or on the toilet paper. If you underwent a bowel prep for your procedure, you may not have a normal bowel movement for a few days.  Please Note:  You might notice some irritation and congestion in your nose or some drainage.  This is from the oxygen used during your procedure.  There is no need for concern and it should clear up in a day or so.  SYMPTOMS TO REPORT IMMEDIATELY:  Following lower endoscopy (colonoscopy or flexible sigmoidoscopy):  Excessive amounts of blood in the stool  Significant tenderness or worsening of abdominal pains  Swelling of the abdomen that is new, acute  Fever of 100F or higher  For urgent or emergent issues, a gastroenterologist can be reached at any hour by calling [phone]. Do not use MyChart messaging for urgent concerns.    DIET:  We do recommend a small meal at first, but then you may proceed to your regular diet.  Drink plenty of fluids but you should  avoid alcoholic beverages for 24 hours.  ACTIVITY:  You should plan to take it easy for the rest of today and you should NOT DRIVE or use heavy machinery until tomorrow (because of the sedation medicines used during the test).    FOLLOW UP: Our staff will call the number listed on your records 48-72 hours following your procedure to check on you and address any questions or concerns that you may have regarding the information given to you following your procedure. If we do not reach you, we will leave a message.  We will attempt to reach you two times.  During this call, we will ask if you have developed any symptoms of COVID 19. If you develop any symptoms (ie: fever, flu-like symptoms, shortness of breath, cough etc.) before then, please call [phone].  If you test positive for Covid 19 in the 2 weeks post procedure, please call and report this information to [place].    If any biopsies were taken you will be contacted by phone or by letter within the next 1-3 weeks.  Please call us at [phone] if you have not heard about the biopsies in 3 weeks.    SIGNATURES/CONFIDENTIALITY: You and/or your care partner have signed paperwork which will be entered into your electronic medical record.  These signatures attest to the fact that that the information above on your After Visit Summary has been reviewed and is understood.  Full responsibility of the confidentiality of this discharge information  lies with you and/or your care-partner.  

## 2020-12-21 NOTE — Progress Notes (Signed)
VS by Butch Penny;

## 2020-12-21 NOTE — Progress Notes (Signed)
[facility] History and Physical   Primary Care Physician:  [name], MD   Reason for Procedure:   Colon cancer screening, high risk  Plan:    Screening colonoscopy     HPI: Adam Becker is a 65 y.o. male undergoing screening colonoscopy. His father was diagnosed with colon cancer in his 78s.  His last colonoscopy was in 2015 and a nonadenomatous polyp was removed.  He has no chronic GI symptoms.   Past Medical History:  Diagnosis Date   Asplenia 11/11/2014   Chronic kidney disease 11/2011   admission for ATN cr 11, acidosis   Diabetes mellitus without complication (HCC)    GERD (gastroesophageal reflux disease)    HIV infection ([place])    Hyperglycemia 04/04/2016   Hyperlipidemia    Hypertension    Impaired fasting glucose 07/27/2015   Neuromuscular disorder ([place])    peripheral neuropathy   Urethritis 02/11/2015    Past Surgical History:  Procedure Laterality Date   COLONOSCOPY     LIPOSUCTION HEAD / NECK  2001   posterior neck   SPLENECTOMY  2000   reason unclear "stopped working"    Prior to Admission medications   Medication Sig Start Date End Date Taking? Authorizing Provider  allopurinol (ZYLOPRIM) 100 MG tablet TAKE 1 TABLET BY MOUTH EVERY DAY 11/01/20  Yes [name], MD  aspirin EC 81 MG tablet Take 1 tablet (81 mg total) by mouth daily. 08/22/18  Yes Guse, Jacquelynn Cree, FNP  cholecalciferol (VITAMIN D3) 25 MCG (1000 UNIT) tablet Take 1,000 Units by mouth daily.   Yes [provider]  darunavir (PREZISTA) 800 MG tablet Take 1 tablet (800 mg total) by mouth daily. 07/01/20  Yes [name], [name], MD  elvitegravir-cobicistat-emtricitabine-tenofovir (GENVOYA) 150-150-200-10 MG TABS tablet TAKE 1 TABLET BY MOUTH EVERY DAY WITH BREAKFAST 07/01/20  Yes [name], [name], MD  empagliflozin (JARDIANCE) 10 MG TABS tablet Take 1 tablet (10 mg total) by mouth daily before breakfast. 06/01/20  Yes [name], MD  famotidine (PEPCID)  10 MG tablet Take 10 mg by mouth 2 (two) times daily as needed for heartburn or indigestion.   Yes [provider]  gabapentin (NEURONTIN) 300 MG capsule Take 2 capsules (600 mg total) by mouth 3 (three) times daily. 07/14/20  Yes [name], MD  metFORMIN (GLUCOPHAGE) 500 MG tablet Take 1 tablet QAM and 2 tablets QPM 07/08/20  Yes [name], MD  rOPINIRole (REQUIP) 1 MG tablet Take 1.5 mg by mouth 4 (four) times daily. 06/02/20  Yes [provider]  telmisartan (MICARDIS) 40 MG tablet TAKE 1 TABLET BY MOUTH EVERYDAY AT BEDTIME 09/21/20  Yes [name], MD  tiZANidine (ZANAFLEX) 4 MG tablet Take 1 tablet (4 mg total) by mouth every 6 (six) hours as needed for muscle spasms. 07/14/20  Yes [name], MD  vitamin B-12 (CYANOCOBALAMIN) 1000 MCG tablet Take 1,000 mcg by mouth daily.   Yes [provider]  blood glucose meter kit and supplies KIT Dispense based on patient and insurance preference. Use up to four times daily as directed. 05/13/20   [name], MD  glucose blood test strip Use as instructed to check glucose BID 05/14/20   [name], MD  Lancets [facility] ULTRASOFT) lancets Use as instructed to check glucose BID 05/14/20   [name], MD    Current Outpatient Medications  Medication Sig Dispense Refill   allopurinol (ZYLOPRIM) 100 MG  tablet TAKE 1 TABLET BY MOUTH EVERY DAY 90 tablet 1   aspirin EC 81 MG tablet Take 1 tablet (81 mg total) by mouth daily. 90 tablet 3   cholecalciferol (VITAMIN D3) 25 MCG (1000 UNIT) tablet Take 1,000 Units by mouth daily.     darunavir (PREZISTA) 800 MG tablet Take 1 tablet (800 mg total) by mouth daily. 60 tablet 11   elvitegravir-cobicistat-emtricitabine-tenofovir (GENVOYA) 150-150-200-10 MG TABS tablet TAKE 1 TABLET BY MOUTH EVERY DAY WITH BREAKFAST 30 tablet 11   empagliflozin (JARDIANCE) 10 MG TABS tablet Take 1 tablet (10 mg total) by mouth daily before breakfast. 30 tablet 11   famotidine  (PEPCID) 10 MG tablet Take 10 mg by mouth 2 (two) times daily as needed for heartburn or indigestion.     gabapentin (NEURONTIN) 300 MG capsule Take 2 capsules (600 mg total) by mouth 3 (three) times daily. 180 capsule 5   metFORMIN (GLUCOPHAGE) 500 MG tablet Take 1 tablet QAM and 2 tablets QPM 270 tablet 3   rOPINIRole (REQUIP) 1 MG tablet Take 1.5 mg by mouth 4 (four) times daily.     telmisartan (MICARDIS) 40 MG tablet TAKE 1 TABLET BY MOUTH EVERYDAY AT BEDTIME 90 tablet 1   tiZANidine (ZANAFLEX) 4 MG tablet Take 1 tablet (4 mg total) by mouth every 6 (six) hours as needed for muscle spasms. 30 tablet 0   vitamin B-12 (CYANOCOBALAMIN) 1000 MCG tablet Take 1,000 mcg by mouth daily.     blood glucose meter kit and supplies KIT Dispense based on patient and insurance preference. Use up to four times daily as directed. 1 each 11   glucose blood test strip Use as instructed to check glucose BID 100 each 12   Lancets (ONETOUCH ULTRASOFT) lancets Use as instructed to check glucose BID 100 each 12   Current Facility-Administered Medications  Medication Dose Route Frequency Provider Last Rate Last Admin   0.9 %  sodium chloride infusion  500 mL Intravenous Continuous Daryel November, MD        Allergies as of 12/21/2020 - Review Complete 12/21/2020  Allergen Reaction Noted   Pravastatin Other (See Comments) 06/03/2020    Family History  Problem Relation Age of Onset   Cancer Mother    Breast cancer Mother    Colon polyps Father    Colon cancer Father    Birth defects Son        Breast    Rectal cancer Neg Hx    Stomach cancer Neg Hx     Social History   Socioeconomic History   Marital status: Single    Spouse name: Not on file   Number of children: Not on file   Years of education: Not on file   Highest education level: Not on file  Occupational History   Not on file  Tobacco Use   Smoking status: Never   Smokeless tobacco: Never  Vaping Use   Vaping Use: Never used   Substance and Sexual Activity   Alcohol use: No   Drug use: No   Sexual activity: Not Currently    Comment: declined condoms  Other Topics Concern   Not on file  Social History Narrative   Independent baseline. Lives by himself.  Education 12th.  Children none.  Caffeine tea one cup 4 x week.   Social Determinants of Health   Financial Resource Strain: Low Risk    Difficulty of Paying Living Expenses: Not hard at all  Food Insecurity: No Food  Insecurity   Worried About Charity fundraiser in the Last Year: Never true   [place] in the Last Year: Never true  Transportation Needs: No Transportation Needs   Lack of Transportation (Medical): No   Lack of Transportation (Non-Medical): No  Physical Activity: Sufficiently Active   Days of Exercise per Week: 7 days   Minutes of Exercise per Session: 30 min  Stress: No Stress Concern Present   Feeling of Stress : Not at all  Social Connections: Unknown   Frequency of Communication with Friends and Family: More than three times a week   Frequency of Social Gatherings with Friends and Family: More than three times a week   Attends Religious Services: More than 4 times per year   Active Member of [employer] or Organizations: Yes   Attends Music therapist: More than 4 times per year   Marital Status: Not on file  Intimate Partner Violence: Not At Risk   Fear of Current or Ex-Partner: No   Emotionally Abused: No   Physically Abused: No   Sexually Abused: No    Review of Systems:  All other review of systems negative except as mentioned in the HPI.  Physical Exam: Vital signs There were no vitals taken for this visit.  General:   Alert,  Well-developed, well-nourished, pleasant and cooperative in NAD Airway:  Mallampati 1 Lungs:  Clear throughout to auscultation.   Heart:  Regular rate and rhythm; no murmurs, clicks, rubs,  or gallops. Abdomen:  Soft, nontender and nondistended. Normal bowel sounds.    Neuro/Psych:  Normal mood and affect. A and O x 3   Teana Lindahl E. Candis Schatz, MD [facility]

## 2020-12-23 ENCOUNTER — Telehealth: Payer: Self-pay | Admitting: *Deleted

## 2020-12-23 DIAGNOSIS — U071 COVID-19: Secondary | ICD-10-CM | POA: Diagnosis not present

## 2020-12-23 NOTE — Telephone Encounter (Signed)
  Follow up Call-  Call back number 12/21/2020  Post procedure Call Back phone  # [phone]  Permission to leave phone message No  Some recent data might be hidden     Patient questions:  Do you have a fever, pain , or abdominal swelling? No. Pain Score  0 *  Have you tolerated food without any problems? Yes.    Have you been able to return to your normal activities? Yes.    Do you have any questions about your discharge instructions: Diet   No. Medications  No. Follow up visit  No.  Do you have questions or concerns about your Care? No.  Actions: * If pain score is 4 or above: No action needed, pain <4.  Have you developed a fever since your procedure? no  2.   Have you had an respiratory symptoms (SOB or cough) since your procedure? no  3.   Have you tested positive for COVID 19 since your procedure   4.   Have you had any family members/close contacts diagnosed with the COVID 19 since your procedure?  no   If yes to any of these questions please route to Joylene John, RN and Joella Prince, RN

## 2020-12-23 NOTE — Telephone Encounter (Signed)
First follow up call attempt.  LVM. 

## 2021-01-13 ENCOUNTER — Telehealth: Payer: Self-pay | Admitting: Internal Medicine

## 2021-01-13 NOTE — Telephone Encounter (Signed)
Pt was seen in office on 11/22 at [facility] with PCP. Pt and PCP discussed pts leg pain. Pt is stating the pain is now worse than before. Pt would like to be advised on what to do exactly for this.

## 2021-01-14 ENCOUNTER — Other Ambulatory Visit: Payer: Self-pay | Admitting: Internal Medicine

## 2021-01-14 DIAGNOSIS — G629 Polyneuropathy, unspecified: Secondary | ICD-10-CM

## 2021-01-14 MED ORDER — GABAPENTIN 400 MG PO CAPS: 800.0000 mg | ORAL_CAPSULE | Freq: Three times a day (TID) | ORAL | 1 refills | Status: DC

## 2021-01-14 NOTE — Assessment & Plan Note (Signed)
Suspect secondary to diabetes and HIV>  Increase gabapentin to 400 mg tid and refer to Dr Manuella Ghazi

## 2021-01-14 NOTE — Telephone Encounter (Signed)
Spoke with pt and he stated that the leg pain has started to occur more often and especially at night time when laying down. He stated that he is taking the 600 mg of gabapentin three times daily.

## 2021-01-14 NOTE — Telephone Encounter (Signed)
Spoke with pt and informed him of the dose change. Pt gave a verbal understanding. I also sent the message to pt in mychart to refer back to.

## 2021-01-18 ENCOUNTER — Telehealth: Payer: Self-pay

## 2021-01-18 MED ORDER — GABAPENTIN 400 MG PO CAPS: 800.0000 mg | ORAL_CAPSULE | Freq: Three times a day (TID) | ORAL | 1 refills | Status: DC

## 2021-01-18 NOTE — Telephone Encounter (Signed)
Pt called this morning and stated that he went to the pharmacy to fill the new dose of Gabapentin but they are stating that they have not received the prescription.   I have resent the rx to [facility] in [place].

## 2021-01-18 NOTE — Telephone Encounter (Signed)
Resent rx and pt is aware.

## 2021-01-18 NOTE — Telephone Encounter (Signed)
Patient called in he states he is at the pharmacy and they have not recv  req for gabapentin and he is completely out. I spoke with Luther Redo she advise will refer to Doctor Derrel Nip and it may not be today. Patient advised again he completely out . Spoke with Janett Billow she located prescription and she advise that she resent to Pharmacy and patient should be able to get it as soon as get over

## 2021-01-21 ENCOUNTER — Other Ambulatory Visit: Payer: Self-pay | Admitting: Internal Medicine

## 2021-01-21 ENCOUNTER — Telehealth: Payer: Self-pay | Admitting: Internal Medicine

## 2021-01-21 MED ORDER — PREDNISONE 10 MG PO TABS: ORAL_TABLET | ORAL | 0 refills | Status: DC

## 2021-01-21 NOTE — Telephone Encounter (Signed)
Patient called in stated he is needing a prescription of prednisone for gout. Please call patient @ [phone]

## 2021-01-21 NOTE — Telephone Encounter (Signed)
Spoke with pt and he stated that he is taking the allopurinol daily. Pt is also aware that the prednisone taper has been sent in.

## 2021-01-21 NOTE — Telephone Encounter (Signed)
Pt stated that a couple of days ago he woke up with a gout flare in both right and left toes. Pt stated that they are swollen, red and painful. Pt is wanting to know if a prednisone taper can be called in.

## 2021-02-11 DIAGNOSIS — G5603 Carpal tunnel syndrome, bilateral upper limbs: Secondary | ICD-10-CM | POA: Diagnosis not present

## 2021-02-11 DIAGNOSIS — G4733 Obstructive sleep apnea (adult) (pediatric): Secondary | ICD-10-CM | POA: Diagnosis not present

## 2021-02-11 DIAGNOSIS — E559 Vitamin D deficiency, unspecified: Secondary | ICD-10-CM | POA: Diagnosis not present

## 2021-02-11 DIAGNOSIS — E1159 Type 2 diabetes mellitus with other circulatory complications: Secondary | ICD-10-CM | POA: Diagnosis not present

## 2021-02-11 DIAGNOSIS — G608 Other hereditary and idiopathic neuropathies: Secondary | ICD-10-CM | POA: Diagnosis not present

## 2021-02-11 DIAGNOSIS — E1169 Type 2 diabetes mellitus with other specified complication: Secondary | ICD-10-CM | POA: Diagnosis not present

## 2021-02-11 DIAGNOSIS — I152 Hypertension secondary to endocrine disorders: Secondary | ICD-10-CM | POA: Diagnosis not present

## 2021-02-11 DIAGNOSIS — G2581 Restless legs syndrome: Secondary | ICD-10-CM | POA: Diagnosis not present

## 2021-02-11 DIAGNOSIS — E669 Obesity, unspecified: Secondary | ICD-10-CM | POA: Diagnosis not present

## 2021-02-14 ENCOUNTER — Telehealth: Payer: Self-pay | Admitting: Internal Medicine

## 2021-02-14 NOTE — Telephone Encounter (Signed)
Pt called in stating that when he got up to use the bathroom this morning,he felt a burning sensation. Pt was wondering if that burning sensation was coming from the medication he is taking. Pt didn't provide me with the medication he is taking. Pt requesting callback.

## 2021-02-15 NOTE — Telephone Encounter (Signed)
Spoke with pt and he stated that the burning sensation with urination only happened yesterday morning. No symptoms since. Pt stated that he may have taken his metformin twice yesterday evening and wasn't sure if maybe that could have caused it.

## 2021-02-16 NOTE — Telephone Encounter (Signed)
Spoke with pt and he stated that he wait until his appt on 02/22/2021. Pt was advised that if symptoms came back to give [place] a call. Pt gave a verbal understanding.

## 2021-02-22 ENCOUNTER — Other Ambulatory Visit: Payer: Self-pay | Admitting: Internal Medicine

## 2021-02-22 ENCOUNTER — Ambulatory Visit (INDEPENDENT_AMBULATORY_CARE_PROVIDER_SITE_OTHER): Payer: Medicare Other | Admitting: Internal Medicine

## 2021-02-22 ENCOUNTER — Encounter: Payer: Self-pay | Admitting: Internal Medicine

## 2021-02-22 ENCOUNTER — Other Ambulatory Visit: Payer: Self-pay

## 2021-02-22 VITALS — BP 130/66 | HR 76 | Temp 98.0°F | Ht 71.0 in | Wt 229.4 lb

## 2021-02-22 DIAGNOSIS — E669 Obesity, unspecified: Secondary | ICD-10-CM

## 2021-02-22 DIAGNOSIS — M791 Myalgia, unspecified site: Secondary | ICD-10-CM | POA: Diagnosis not present

## 2021-02-22 DIAGNOSIS — E1169 Type 2 diabetes mellitus with other specified complication: Secondary | ICD-10-CM

## 2021-02-22 DIAGNOSIS — R748 Abnormal levels of other serum enzymes: Secondary | ICD-10-CM

## 2021-02-22 DIAGNOSIS — G720 Drug-induced myopathy: Secondary | ICD-10-CM | POA: Diagnosis not present

## 2021-02-22 DIAGNOSIS — E1159 Type 2 diabetes mellitus with other circulatory complications: Secondary | ICD-10-CM

## 2021-02-22 DIAGNOSIS — I152 Hypertension secondary to endocrine disorders: Secondary | ICD-10-CM

## 2021-02-22 DIAGNOSIS — G629 Polyneuropathy, unspecified: Secondary | ICD-10-CM | POA: Diagnosis not present

## 2021-02-22 DIAGNOSIS — E7800 Pure hypercholesterolemia, unspecified: Secondary | ICD-10-CM

## 2021-02-22 DIAGNOSIS — T466X5A Adverse effect of antihyperlipidemic and antiarteriosclerotic drugs, initial encounter: Secondary | ICD-10-CM

## 2021-02-22 DIAGNOSIS — E611 Iron deficiency: Secondary | ICD-10-CM

## 2021-02-22 DIAGNOSIS — G2581 Restless legs syndrome: Secondary | ICD-10-CM | POA: Insufficient documentation

## 2021-02-22 LAB — COMPREHENSIVE METABOLIC PANEL
ALT: 31 U/L (ref 0–53)
AST: 32 U/L (ref 0–37)
Albumin: 4.5 g/dL (ref 3.5–5.2)
Alkaline Phosphatase: 50 U/L (ref 39–117)
BUN: 11 mg/dL (ref 6–23)
CO2: 30 mEq/L (ref 19–32)
Calcium: 9.7 mg/dL (ref 8.4–10.5)
Chloride: 101 mEq/L (ref 96–112)
Creatinine, Ser: 1.12 mg/dL (ref 0.40–1.50)
GFR: 69.48 mL/min (ref >60.00)
Glucose, Bld: 81 mg/dL (ref 70–99)
Potassium: 4.3 mEq/L (ref 3.5–5.1)
Sodium: 139 mEq/L (ref 135–145)
Total Bilirubin: 0.5 mg/dL (ref 0.2–1.2)
Total Protein: 8.1 g/dL (ref 6.0–8.3)

## 2021-02-22 LAB — LIPID PANEL
Cholesterol: 211 mg/dL — ABNORMAL HIGH (ref 0–200)
HDL: 40.5 mg/dL (ref >39.00)
LDL Cholesterol: 132 mg/dL — ABNORMAL HIGH (ref 0–99)
NonHDL: 170.6
Total CHOL/HDL Ratio: 5
Triglycerides: 195 mg/dL — ABNORMAL HIGH (ref 0.0–149.0)
VLDL: 39 mg/dL (ref 0.0–40.0)

## 2021-02-22 LAB — MICROALBUMIN / CREATININE URINE RATIO
Creatinine,U: 41.5 mg/dL
Microalb Creat Ratio: 13.5 mg/g (ref 0.0–30.0)
Microalb, Ur: 5.6 mg/dL — ABNORMAL HIGH (ref 0.0–1.9)

## 2021-02-22 LAB — HEMOGLOBIN A1C: Hgb A1c MFr Bld: 6.2 % (ref 4.6–6.5)

## 2021-02-22 LAB — CK: Total CK: 507 U/L — ABNORMAL HIGH (ref 7–232)

## 2021-02-22 NOTE — Assessment & Plan Note (Signed)
He has had a recent CK elevation with no identifiable cuase . Has not been on pravastatin for months

## 2021-02-22 NOTE — Progress Notes (Signed)
Subjective:  Patient ID: Adam Becker, male    DOB: Nov 12, 1955  Age: 66 y.o. MRN: [id number]  CC: The primary encounter diagnosis was Obesity, diabetes, and hypertension syndrome ([place]). Diagnoses of Pure hypercholesterolemia, Neuropathy, Restless legs syndrome, Iron deficiency, and Statin myopathy were also pertinent to this visit.   This visit occurred during the SARS-CoV-2 public health emergency.  Safety protocols were in place, including screening questions prior to the visit, additional usage of staff PPE, and extensive cleaning of exam room while observing appropriate contact time as indicated for disinfecting solutions.    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  Has been intentionally  reducing his portion size, not eating after 7 pm.  diet to lose weight  and has lost 9 lbs over the last 2 months.  Walking on the treadmill 3 or 4 days per week and using low intensity weights.  Walked on the treadmill today for 30 minutes.   Home BP 125/78   2) RLS:  has been taking Requip for over a year , prescribed by  Neurology  PA Erin Sons with escalating  but no recent or prior iron studies . Has occasional discomfort in legs when watching TV   3) Neuropathy:  etiology considered multifactorial.  Managed with gabapentin.  Symptom is a pain /cramping feeling that  affects mostly his right calf. Lasts for about 10 minutes.   Dose recently increased by Dr Manuella Ghazi . Reports occasional burning type pain in feet that lasts only a few seconds and is very intermittent.  Toes occasional feel warm when socks are on. Resolves with removal of socks.    4) HM:  normal colonoscopy except for tics  Dec 21 2021.  10 yr follow up advised despite FH of colon CA in father.   5)  HIV/Syphilis:  annual follow up with [facility] June 2022 .  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   6) Hyperlipidemia:  untreated due to history of statin myopathy with elevated CK.  Recent CK elevation noted while NOT on statin.  Repeat CK has normalized.  Discussed risks and benefits of resuming statin therapy to lower risk of ASCVD and CV   Outpatient Medications Prior to Visit  Medication Sig Dispense Refill   allopurinol (ZYLOPRIM) 100 MG tablet TAKE 1 TABLET BY MOUTH EVERY DAY 90 tablet 1   aspirin EC 81 MG tablet Take 1 tablet (81 mg total) by mouth daily. 90 tablet 3   blood glucose meter kit and supplies KIT Dispense based on patient and insurance preference. Use up to four times daily as directed. 1 each 11   cholecalciferol (VITAMIN D3) 25 MCG (1000 UNIT) tablet Take 1,000 Units by mouth daily.     darunavir (PREZISTA) 800 MG tablet Take 1 tablet (800 mg total) by mouth daily. 60 tablet 11   elvitegravir-cobicistat-emtricitabine-tenofovir (GENVOYA) 150-150-200-10 MG TABS tablet TAKE 1 TABLET BY MOUTH EVERY DAY WITH BREAKFAST 30 tablet 11   empagliflozin (JARDIANCE) 10 MG TABS tablet Take 1 tablet (10 mg total) by mouth daily before breakfast. 30 tablet 11   famotidine (PEPCID) 10 MG tablet Take 10 mg by mouth 2 (two) times daily as needed for heartburn or indigestion.  gabapentin (NEURONTIN) 400 MG capsule Take 2 capsules (800 mg total) by mouth 3 (three) times daily. 180 capsule 1   glucose blood test strip Use as instructed to check glucose BID 100 each 12   Lancets (ONETOUCH ULTRASOFT) lancets Use as instructed to check glucose BID 100 each 12   metFORMIN (GLUCOPHAGE) 500 MG tablet Take 1 tablet QAM and 2 tablets QPM 270 tablet 3   pravastatin (PRAVACHOL) 40 MG tablet Take 40 mg by mouth daily.     rOPINIRole (REQUIP) 1 MG tablet Take 1.5 mg by mouth 4 (four) times daily.     telmisartan (MICARDIS) 40 MG tablet TAKE 1 TABLET BY MOUTH EVERYDAY AT BEDTIME  90 tablet 1   tiZANidine (ZANAFLEX) 4 MG tablet Take 1 tablet (4 mg total) by mouth every 6 (six) hours as needed for muscle spasms. 30 tablet 0   vitamin B-12 (CYANOCOBALAMIN) 1000 MCG tablet Take 1,000 mcg by mouth daily.     predniSONE (DELTASONE) 10 MG tablet 6 tablets daily for 3 days, then reduce by 1 tablet daily until gone (Patient not taking: Reported on 02/22/2021) 33 tablet 0   No facility-administered medications prior to visit.    Review of Systems;  Patient denies headache, fevers, malaise, unintentional weight loss, skin rash, eye pain, sinus congestion and sinus pain, sore throat, dysphagia,  hemoptysis , cough, dyspnea, wheezing, chest pain, palpitations, orthopnea, edema, abdominal pain, nausea, melena, diarrhea, constipation, flank pain, dysuria, hematuria, urinary  Frequency, nocturia, numbness, tingling, seizures,  Focal weakness, Loss of consciousness,  Tremor, insomnia, depression, anxiety, and suicidal ideation.      Objective:  BP 130/66 (BP Location: Left Arm, Patient Position: Sitting, Cuff Size: Large)    Pulse 76    Temp 98 F (36.7 C) (Oral)    Ht '5\' 11"'  (1.803 m)    Wt 229 lb 6.4 oz (104.1 kg)    SpO2 96%    BMI 31.99 kg/m   BP Readings from Last 3 Encounters:  02/22/21 130/66  12/21/20 125/80  12/14/20 117/83    Wt Readings from Last 3 Encounters:  02/22/21 229 lb 6.4 oz (104.1 kg)  12/14/20 231 lb 6.4 oz (105 kg)  12/03/20 225 lb (102.1 kg)    General appearance: alert, cooperative and appears stated age Ears: normal TM's and external ear canals both ears Throat: lips, mucosa, and tongue normal; teeth and gums normal Neck: no adenopathy, no carotid bruit, supple, symmetrical, trachea midline and thyroid not enlarged, symmetric, no tenderness/mass/nodules Back: symmetric, no curvature. ROM normal. No CVA tenderness. Lungs: clear to auscultation bilaterally Heart: regular rate and rhythm, S1, S2 normal, no murmur, click, rub or gallop Abdomen: soft,  non-tender; bowel sounds normal; no masses,  no organomegaly Pulses: 2+ and symmetric Skin: Skin color, texture, turgor normal. No rashes or lesions Lymph nodes: Cervical, supraclavicular, and axillary nodes normal.  Lab Results  Component Value Date   HGBA1C 6.0 11/18/2020   HGBA1C 6.6 (H) 08/03/2020   HGBA1C 12.8 (H) 04/29/2020    Lab Results  Component Value Date   CREATININE 1.18 12/14/2020   CREATININE 1.13 11/18/2020   CREATININE 1.22 09/08/2020    Lab Results  Component Value Date   WBC 6.1 09/08/2020   HGB 15.4 09/08/2020   HCT 43.7 09/08/2020   PLT 373 09/08/2020   GLUCOSE 92 12/14/2020   CHOL 215 (H) 11/18/2020   TRIG 179.0 (H) 11/18/2020   HDL 40.70 11/18/2020   LDLDIRECT 89.0 04/29/2020   LDLCALC 139 (H)  11/18/2020   ALT 17 12/14/2020   AST 19 12/14/2020   NA 139 12/14/2020   K 4.3 12/14/2020   CL 102 12/14/2020   CREATININE 1.18 12/14/2020   BUN 14 12/14/2020   CO2 30 12/14/2020   TSH 1.06 04/29/2020   PSA 0.45 04/29/2020   HGBA1C 6.0 11/18/2020   MICROALBUR 23.4 (H) 04/29/2020    DG Chest 2 View  Result Date: 09/08/2020 CLINICAL DATA:  Chest pain EXAM: CHEST - 2 VIEW COMPARISON:  02/27/2019 FINDINGS: Stable cardiomediastinal contours. No focal airspace consolidation, pleural effusion, or pneumothorax. Osseous structures appear within normal limits. IMPRESSION: No active cardiopulmonary disease. Electronically Signed   By: Davina Poke D.O.   On: 09/08/2020 11:57    Assessment & Plan:   Problem List Items Addressed This Visit     Hyperlipidemia    Untreated due to history of statin myalgia.  He is willing to resume therapy with rosuvastatin if baseline CK is normal as primary prevention       Relevant Medications   pravastatin (PRAVACHOL) 40 MG tablet   Other Relevant Orders   Lipid Profile   CK (Creatine Kinase)   Obesity, diabetes, and hypertension syndrome ([place]) - Primary    Excellent  glycemic control with addition of Jardiance but  with  worsening proteinuria     BP is at goal with ARB. 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 Not clear if there was an interaction with his HAART therapy.  Lab Results  Component Value Date   HGBA1C 6.0 11/18/2020   Lab Results  Component Value Date   MICROALBUR 23.4 (H) 04/29/2020   MICROALBUR 5.7 (H) 03/11/2019          Relevant Medications   pravastatin (PRAVACHOL) 40 MG tablet   Other Relevant Orders   HgB A1c   Comp Met (CMET)   Microalbumin / creatinine urine ratio   Neuropathy    Gabapentin dose increased by Dr Manuella Ghazi to 1600 mg daily in divided doses.       Statin myopathy    He has had a recent CK elevation with no identifiable cuase . Has not been on pravastatin for months      Restless legs syndrome    Suggested by history.  Iron studies needed.  Continue Requip      Relevant Orders   IBC + Ferritin   CK (Creatine Kinase)   Other Visit Diagnoses     Iron deficiency       Relevant Orders   IBC + Ferritin       I spent 30 minutes dedicated to the care of this patient on the date of this encounter to include pre-visit review of patient's medical history,  most recent imaging studies, Face-to-face time with the patient , and post visit ordering of testing and therapeutics.    Follow-up: No follow-ups on file.   [name], MD  Subjective:  Patient ID: Adam Becker, male    DOB: [birth year]  Age: 66 y.o. MRN: [id number]  CC: The primary encounter diagnosis was Obesity, diabetes, and hypertension syndrome ([place]). Diagnoses of Pure hypercholesterolemia, Neuropathy, Restless legs syndrome, Iron deficiency, and Statin myopathy were also pertinent to this visit.   This visit occurred during the SARS-CoV-2 public health emergency.  Safety protocols were in place, including screening questions prior to the visit, additional usage of staff PPE, and extensive  cleaning  of exam room while observing appropriate contact time as indicated for disinfecting solutions.    HPI Adam Becker presents for  Chief Complaint  Patient presents with   Follow-up    4 month follow up on diabetes      Outpatient Medications Prior to Visit  Medication Sig Dispense Refill   allopurinol (ZYLOPRIM) 100 MG tablet TAKE 1 TABLET BY MOUTH EVERY DAY 90 tablet 1   aspirin EC 81 MG tablet Take 1 tablet (81 mg total) by mouth daily. 90 tablet 3   blood glucose meter kit and supplies KIT Dispense based on patient and insurance preference. Use up to four times daily as directed. 1 each 11   cholecalciferol (VITAMIN D3) 25 MCG (1000 UNIT) tablet Take 1,000 Units by mouth daily.     darunavir (PREZISTA) 800 MG tablet Take 1 tablet (800 mg total) by mouth daily. 60 tablet 11   elvitegravir-cobicistat-emtricitabine-tenofovir (GENVOYA) 150-150-200-10 MG TABS tablet TAKE 1 TABLET BY MOUTH EVERY DAY WITH BREAKFAST 30 tablet 11   empagliflozin (JARDIANCE) 10 MG TABS tablet Take 1 tablet (10 mg total) by mouth daily before breakfast. 30 tablet 11   famotidine (PEPCID) 10 MG tablet Take 10 mg by mouth 2 (two) times daily as needed for heartburn or indigestion.     gabapentin (NEURONTIN) 400 MG capsule Take 2 capsules (800 mg total) by mouth 3 (three) times daily. 180 capsule 1   glucose blood test strip Use as instructed to check glucose BID 100 each 12   Lancets (ONETOUCH ULTRASOFT) lancets Use as instructed to check glucose BID 100 each 12   metFORMIN (GLUCOPHAGE) 500 MG tablet Take 1 tablet QAM and 2 tablets QPM 270 tablet 3   pravastatin (PRAVACHOL) 40 MG tablet Take 40 mg by mouth daily.     rOPINIRole (REQUIP) 1 MG tablet Take 1.5 mg by mouth 4 (four) times daily.     telmisartan (MICARDIS) 40 MG tablet TAKE 1 TABLET BY MOUTH EVERYDAY AT BEDTIME 90 tablet 1   tiZANidine (ZANAFLEX) 4 MG tablet Take 1 tablet (4 mg total) by mouth every 6 (six) hours as needed for muscle  spasms. 30 tablet 0   vitamin B-12 (CYANOCOBALAMIN) 1000 MCG tablet Take 1,000 mcg by mouth daily.     predniSONE (DELTASONE) 10 MG tablet 6 tablets daily for 3 days, then reduce by 1 tablet daily until gone (Patient not taking: Reported on 02/22/2021) 33 tablet 0   No facility-administered medications prior to visit.    Review of Systems;  Patient denies headache, fevers, malaise, unintentional weight loss, skin rash, eye pain, sinus congestion and sinus pain, sore throat, dysphagia,  hemoptysis , cough, dyspnea, wheezing, chest pain, palpitations, orthopnea, edema, abdominal pain, nausea, melena, diarrhea, constipation, flank pain, dysuria, hematuria, urinary  Frequency, nocturia, numbness, tingling, seizures,  Focal weakness, Loss of consciousness,  Tremor, insomnia, depression, anxiety, and suicidal ideation.      Objective:  BP 130/66 (BP Location: Left Arm, Patient Position: Sitting, Cuff Size: Large)    Pulse 76    Temp 98 F (36.7 C) (Oral)    Ht '5\' 11"'  (1.803 m)    Wt 229 lb 6.4 oz (104.1 kg)    SpO2 96%    BMI 31.99 kg/m   BP Readings from Last 3 Encounters:  02/22/21 130/66  12/21/20 125/80  12/14/20 117/83    Wt Readings from Last 3 Encounters:  02/22/21 229 lb 6.4 oz (104.1 kg)  12/14/20 231 lb 6.4 oz (105 kg)  12/03/20 225 lb (102.1 kg)    General appearance: alert, cooperative and appears stated age Ears: normal TM's and external ear canals both ears Throat: lips, mucosa, and tongue normal; teeth and gums normal Neck: no adenopathy, no carotid bruit, supple, symmetrical, trachea midline and thyroid not enlarged, symmetric, no tenderness/mass/nodules Back: symmetric, no curvature. ROM normal. No CVA tenderness. Lungs: clear to auscultation bilaterally Heart: regular rate and rhythm, S1, S2 normal, no murmur, click, rub or gallop Abdomen: soft, non-tender; bowel sounds normal; no masses,  no organomegaly Pulses: 2+ and symmetric Skin: Skin color, texture, turgor  normal. No rashes or lesions Lymph nodes: Cervical, supraclavicular, and axillary nodes normal.  Lab Results  Component Value Date   HGBA1C 6.0 11/18/2020   HGBA1C 6.6 (H) 08/03/2020   HGBA1C 12.8 (H) 04/29/2020    Lab Results  Component Value Date   CREATININE 1.18 12/14/2020   CREATININE 1.13 11/18/2020   CREATININE 1.22 09/08/2020    Lab Results  Component Value Date   WBC 6.1 09/08/2020   HGB 15.4 09/08/2020   HCT 43.7 09/08/2020   PLT 373 09/08/2020   GLUCOSE 92 12/14/2020   CHOL 215 (H) 11/18/2020   TRIG 179.0 (H) 11/18/2020   HDL 40.70 11/18/2020   LDLDIRECT 89.0 04/29/2020   LDLCALC 139 (H) 11/18/2020   ALT 17 12/14/2020   AST 19 12/14/2020   NA 139 12/14/2020   K 4.3 12/14/2020   CL 102 12/14/2020   CREATININE 1.18 12/14/2020   BUN 14 12/14/2020   CO2 30 12/14/2020   TSH 1.06 04/29/2020   PSA 0.45 04/29/2020   HGBA1C 6.0 11/18/2020   MICROALBUR 23.4 (H) 04/29/2020    DG Chest 2 View  Result Date: 09/08/2020 CLINICAL DATA:  Chest pain EXAM: CHEST - 2 VIEW COMPARISON:  02/27/2019 FINDINGS: Stable cardiomediastinal contours. No focal airspace consolidation, pleural effusion, or pneumothorax. Osseous structures appear within normal limits. IMPRESSION: No active cardiopulmonary disease. Electronically Signed   By: Davina Poke D.O.   On: 09/08/2020 11:57    Assessment & Plan:   Problem List Items Addressed This Visit     Hyperlipidemia    Untreated due to history of statin myalgia.  He is willing to resume therapy with rosuvastatin if baseline CK is normal as primary prevention       Relevant Medications   pravastatin (PRAVACHOL) 40 MG tablet   Other Relevant Orders   Lipid Profile   CK (Creatine Kinase)   Obesity, diabetes, and hypertension syndrome ([place]) - Primary    Excellent  glycemic control with addition of Jardiance but with  worsening proteinuria     BP is at goal with ARB. Hyperlipidemia now untreated due to presumed  statin myopathy  suggested by elevated CK , normal ANA and history of muscle pain , . Not clear if there was an interaction with his HAART therapy.  Lab Results  Component Value Date   HGBA1C 6.0 11/18/2020   Lab Results  Component Value Date   MICROALBUR 23.4 (H) 04/29/2020   MICROALBUR 5.7 (H) 03/11/2019          Relevant Medications   pravastatin (PRAVACHOL) 40 MG tablet   Other Relevant Orders   HgB A1c   Comp Met (CMET)   Microalbumin / creatinine urine ratio   Neuropathy    Gabapentin dose increased by Dr Manuella Ghazi to 1600 mg daily in divided doses.       Statin myopathy    He has had a recent CK elevation  with no identifiable cuase . Has not been on pravastatin for months      Restless legs syndrome    Suggested by history.  Iron studies needed.  Continue Requip      Relevant Orders   IBC + Ferritin   CK (Creatine Kinase)   Other Visit Diagnoses     Iron deficiency       Relevant Orders   IBC + Ferritin       I spent 40 minutes dedicated to the care of this patient on the date of this encounter to include pre-visit review of patient's medical history,  most recent imaging studies, Face-to-face time with the patient , and post visit ordering of testing and therapeutics.    Follow-up: No follow-ups on file.   [name], MD

## 2021-02-22 NOTE — Assessment & Plan Note (Signed)
Gabapentin dose increased by Dr Manuella Ghazi to 1600 mg daily in divided doses.

## 2021-02-22 NOTE — Assessment & Plan Note (Signed)
Untreated due to history of statin myalgia.  He is willing to resume therapy with rosuvastatin if baseline CK is normal as primary prevention

## 2021-02-22 NOTE — Patient Instructions (Addendum)
You are doing well!   If your muscle enzyme is normal today and your cholesterol is high we will discuss A TRIAL of every other day Crestor I

## 2021-02-22 NOTE — Assessment & Plan Note (Signed)
Suggested by history.  Iron studies needed.  Continue Requip

## 2021-02-22 NOTE — Assessment & Plan Note (Signed)
Excellent  glycemic control with addition of Jardiance but with  worsening proteinuria     BP is at goal with ARB. 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 Not clear if there was an interaction with his HAART therapy.  Lab Results  Component Value Date   HGBA1C 6.0 11/18/2020   Lab Results  Component Value Date   MICROALBUR 23.4 (H) 04/29/2020   MICROALBUR 5.7 (H) 03/11/2019

## 2021-02-23 LAB — IBC + FERRITIN
Ferritin: 117.7 ng/mL (ref 22.0–322.0)
Iron: 139 ug/dL (ref 42–165)
Saturation Ratios: 31.9 % (ref 20.0–50.0)
TIBC: 435.4 ug/dL (ref 250.0–450.0)
Transferrin: 311 mg/dL (ref 212.0–360.0)

## 2021-02-25 ENCOUNTER — Telehealth: Payer: Self-pay

## 2021-02-25 NOTE — Telephone Encounter (Signed)
LMTCB in regards to lab results.  

## 2021-03-02 DIAGNOSIS — R748 Abnormal levels of other serum enzymes: Secondary | ICD-10-CM | POA: Insufficient documentation

## 2021-03-02 NOTE — Assessment & Plan Note (Signed)
His CK elevations are unrelated to statin use and of uncertain etiology .  I am recommending a muscle biopsy.

## 2021-03-02 NOTE — Addendum Note (Signed)
Addended by: [name] on: 03/02/2021 02:47 PM   Modules accepted: Orders

## 2021-03-03 DIAGNOSIS — U071 COVID-19: Secondary | ICD-10-CM | POA: Diagnosis not present

## 2021-03-04 DIAGNOSIS — Z20822 Contact with and (suspected) exposure to covid-19: Secondary | ICD-10-CM | POA: Diagnosis not present

## 2021-03-07 ENCOUNTER — Telehealth: Payer: Self-pay | Admitting: Internal Medicine

## 2021-03-07 NOTE — Telephone Encounter (Signed)
Spoke with pt and advised him Dr. Derrel Nip that he does not need to be testing once a week unless he develops symptoms. Pt wanted to know what to do if he test positive again. Pt was advised that he would need to quarantine for 5 days and then return to normal activity as long as symptoms are gone or getting better. Pt wanted to know if he needed any medication if he test positive and doesn't have symptoms. I explained to him that we do not treat unless the person has symptoms. Pt gave a verbal understanding.

## 2021-03-07 NOTE — Telephone Encounter (Signed)
Pt stated that he test himself for covid once a week. Pt stated that Sunday he tested and it came back positive and then 10 minutes later her took another test of a different brand, it was negative. Pt stated that he has no symptoms except last Monday he had some sinus congestion, took sinus medicine and by Wednesday he was back to normal. Pt wants to know if he needs to be PCR tested or just use another home test to see what it says.

## 2021-03-07 NOTE — Telephone Encounter (Signed)
Pt called in stating he took two Covid test on Sunday. Pt stated first test came back positive and then pt repeated the Covid test shortly after first. Pt stated second test came back negative. Pt would like to know what should he do. Pt requesting callback

## 2021-03-10 DIAGNOSIS — R748 Abnormal levels of other serum enzymes: Secondary | ICD-10-CM | POA: Diagnosis not present

## 2021-03-16 ENCOUNTER — Other Ambulatory Visit: Payer: Self-pay | Admitting: General Surgery

## 2021-03-16 NOTE — Progress Notes (Signed)
\Progress Notes - documented in this encounter Ishi Danser, Geronimo Boot, MD - 03/10/2021 9:15 AM EST Formatting of this note is different from the original. Subjective:   Patient ID: Adam Becker is a 66 y.o. male.  HPI  The following portions of the patient's history were reviewed and updated as appropriate.  This a new patient is here today for: office visit. Patient was referred by Dr. Derrel Nip for evaluation of a calf muscle biopsy.   Patient reports for the last 4-5 years he has experienced intermittent "tightness/discomfort" in proximal aspects both of his calves. He reports the right side is more frequently involved than the left. Patient states he had been appreciating tightening every day, but for the last month he has been symptom-free.   The patient reports for decades he had made use of a "energy pills" that he would get at the convenience store. Over the last year he has noticed that when he uses these consecutive days, by the fourth day he has increasing episodes of muscle discomfort. He has not been taking them for about 2 to weeks and has been symptom-free.  He has had persistently elevated creatine kinase levels, and requested has been made for muscle biopsy.  He has seen Gayland Curry, [place] and Dr. Manuella Ghazi in neurology for neuropathy.   He is followed by Alcide Evener, MD from infectious disease for his immune deficiency state.  Chief Complaint  Patient presents with   Pre-op Exam    BP (!) 142/76   Pulse 72   Temp 36.5 C (97.7 F)   Ht 180.3 cm (5\' 11" )   Wt (!) 104.8 kg (231 lb)   SpO2 97%   BMI 32.22 kg/m   Past Medical History:  Diagnosis Date   CKD (chronic kidney disease) 11/2011   Diabetes mellitus without complication (CMS-HCC)   History of neuromuscular disorder   HIV (human immunodeficiency virus infection) (CMS-HCC)   Hyperlipidemia   Hypertension   Neuropathy   Neurosyphilis   Urethritis    Past Surgical History:  Procedure Laterality Date    LIPOSUCTION HEAD / NECK 2001   COLONOSCOPY 10/13/2008  [facility]   COLONOSCOPY 2015  Dr Delfin Edis   COLONOSCOPY 12/21/2020  Dr Candis Schatz   SPLENECTOMY    Social History   Socioeconomic History   Marital status: Single  Tobacco Use   Smoking status: Never   Smokeless tobacco: Never  Substance and Sexual Activity   Alcohol use: Not Currently   Sexual activity: Not Currently    Allergies  Allergen Reactions   Pravastatin Other (See Comments)  rhabdomyolysis   Current Outpatient Medications  Medication Sig Dispense Refill   allopurinoL (ZYLOPRIM) 100 MG tablet Take 1 tablet by mouth once daily   aspirin 81 MG EC tablet Take 81 mg by mouth once daily   celecoxib (CELEBREX) 200 MG capsule Take 200 mg by mouth once daily   cyanocobalamin (VITAMIN B12) 1000 MCG tablet Take by mouth Take 1,000 mcg by mouth daily.   gabapentin (NEURONTIN) 400 MG capsule Take 1 capsule (400 mg total) by mouth every morning AND 1 capsule (400 mg total) daily with lunch AND 2 capsules (800 mg total) at bedtime. Do all this for 180 days. 360 capsule 1   GENVOYA 150-150-200-10 mg tablet Take 1 tablet by mouth daily with breakfast   JARDIANCE 10 mg tablet   metFORMIN (GLUCOPHAGE) 500 MG tablet 500 mg Patient takes one tablet in the morning and two tablets in the  afternoon.   pravastatin (PRAVACHOL) 40 MG tablet Take 40 mg by mouth once daily TAKE 1 TABLET BY MOUTH EVERY DAY   PREZISTA 800 mg tablet Take 1 tablet by mouth once daily   telmisartan (MICARDIS) 40 MG tablet Take 40 mg by mouth nightly   rOPINIRole (REQUIP) 1 MG tablet Take 1.5 tablets (1.5 mg total) by mouth 4 (four) times daily for 90 days 540 tablet 3   No current facility-administered medications for this visit.   Family History  Problem Relation Age of Onset   Breast cancer Mother   Colon polyps Father   Colon cancer Father   Birth defects Son   Rectal cancer Neg Hx   Stomach cancer Neg Hx   Labs and Radiology:     Component Ref Range & Units 2 wk ago 2 mo ago 3 mo ago 7 mo ago 9 mo ago 10 mo ago  Total CK 7 - 232 U/L 507 High  232 459 High  268 High  312 High  767 High     Component Ref Range & Units 7 mo ago 9 mo ago  Sed Rate 0 - 20 mm/hr 25 High  35 High     Component Ref Range & Units 7 mo ago 9 mo ago  Anti Nuclear Antibody (ANA) NEGATIVE NEGATIVE NEGATIVE CM    Component Ref Range & Units 2 mo ago  CRP 0.5 - 20.0 mg/dL <1.0    Component Ref Range & Units 2 wk ago  Sodium 135 - 145 mEq/L 139  Potassium 3.5 - 5.1 mEq/L 4.3  Chloride 96 - 112 mEq/L 101  CO2 19 - 32 mEq/L 30  Glucose, Bld 70 - 99 mg/dL 81  BUN 6 - 23 mg/dL 11  Creatinine, Ser 0.40 - 1.50 mg/dL 1.12  Total Bilirubin 0.2 - 1.2 mg/dL 0.5  Alkaline Phosphatase 39 - 117 U/L 50  AST 0 - 37 U/L 32  ALT 0 - 53 U/L 31  Total Protein 6.0 - 8.3 g/dL 8.1  Albumin 3.5 - 5.2 g/dL 4.5  GFR >60.00 mL/min 69.48  Comment: Calculated using the CKD-EPI Creatinine Equation (2021)  Calcium 8.4 - 10.5 mg/dL 9.7    Component Ref Range & Units 2 wk ago  Cholesterol 0 - 200 mg/dL 211 High   Comment: ATP III Classification       Desirable:  < 200 mg/dL               Borderline High:  200 - 239 mg/dL          High:  > = 240 mg/dL  Triglycerides 0.0 - 149.0 mg/dL 195.0 High   Comment: Normal:  <150 mg/dLBorderline High:  150 - 199 mg/dL  HDL >39.00 mg/dL 40.50  VLDL 0.0 - 40.0 mg/dL 39.0  LDL Cholesterol 0 - 99 mg/dL 132 High   Total CHOL/HDL Ratio 5  Comment:                Men          Women1/2 Average Risk     3.4          3.3Average Risk          5.0          4.42X Average Risk          9.6       WBC 3.8 - 10.8 Thousand/uL 6.6  RBC 4.20 - 5.80 Million/uL 4.26  Hemoglobin 13.2 - 17.1 g/dL 14.1  HCT 38.5 - 50.0 % 40.6  MCV 80.0 - 100.0 fL 95.3  MCH 27.0 - 33.0 pg 33.1 High   MCHC 32.0 - 36.0 g/dL 34.7  RDW 11.0 - 15.0 % 13.8  Platelets 140 - 400 Thousand/uL 481 High   MPV 7.5 - 12.5 fL 9.6  Neutro Abs 1,500 - 7,800  cells/uL 1,980  Lymphs Abs 850 - 3,900 cells/uL 3,643  Absolute Monocytes 200 - 950 cells/uL 660  Eosinophils Absolute 15 - 500 cells/uL 257  Basophils Absolute 0 - 200 cells/uL 59  Neutrophils Relative % % 30  Total Lymphocyte % 55.2  Monocytes Relative % 10.0  Eosinophils Relative % 3.9  Basophils Relative % 0.9   Component Ref Range & Units 9 mo ago  HIV 1 RNA Quant Copies/mL 44 High   HIV-1 RNA Quant, Log Log cps/mL 1.64 High   Comment: .   Component Ref Range & Units 9 mo ago  CD4 T Cell Abs 400 - 1,790 /uL 1,286  CD4 % Helper T Cell 33 - 65 % 38   Jun 10, 2020:  Component Ref Range & Units 9 mo ago  RPR Ser Ql NON-REACTIVE REACTIVE Abnormal   Positive dating back to September 2018. Component Ref Range & Units 9 mo ago  HIV 1 RNA Quant Copies/mL 44 High   HIV-1 RNA Quant, Log Log cps/mL 1.64 High   Comment: .  Positive dating back to November 2016  Review of Systems  Constitutional: Negative for chills and fever.  Respiratory: Negative for cough.    Objective:  Physical Exam Constitutional:  Appearance: Normal appearance.  Cardiovascular:  Rate and Rhythm: Normal rate and regular rhythm.  Pulses: Normal pulses.  Dorsalis pedis pulses are 2+ on the right side and 2+ on the left side.  Posterior tibial pulses are 2+ on the right side and 2+ on the left side.  Heart sounds: Murmur heard.  Systolic murmur is present with a grade of 1/6. Comments: Normal capillary refill. Pulmonary:  Effort: Pulmonary effort is normal.  Breath sounds: Normal breath sounds.  Musculoskeletal:  Cervical back: Neck supple.  Right lower leg: No edema.  Left lower leg: No edema.  Neurological:  Mental Status: He is alert and oriented to person, place, and time.  Sensory: Sensation is intact.  Coordination: Coordination is intact.  Gait: Gait is intact.  Comments: Lower extremity motor strength testing 5/5 for all lower extremity muscle groups.  Psychiatric:  Mood and Affect:  Mood normal.  Behavior: Behavior normal.    Assessment:   Elevated creatine kinase.  Detectable levels of HIV RNA.  Normal CD4 count.  Use of over-the-counter "energy pills" with possible temporal relation to his most symptomatic periods.  Plan:   Muscle biopsy procedure was reviewed. The patient was to say the least cool" to the idea.  Text messaging with his PCP suggested a biopsy would help determine if he was candidate for treatment of his elevated serum cholesterol with statins.  At this time, the patient is going to defer decision on biopsy until he meets with his PCP.  This note is partially prepared by Ledell Noss, CMA acting as a scribe in the presence of Dr. Hervey Ard, MD.   The documentation recorded by the scribe accurately reflects the service I personally performed and the decisions made by me.   Robert Bellow, MD FACS  Electronically signed by Mayer Masker, MD at 03/11/2021 1:37 PM EST

## 2021-03-21 ENCOUNTER — Ambulatory Visit: Payer: Medicare Other

## 2021-03-22 ENCOUNTER — Ambulatory Visit (INDEPENDENT_AMBULATORY_CARE_PROVIDER_SITE_OTHER): Payer: Medicare Other | Admitting: Pharmacist

## 2021-03-22 ENCOUNTER — Other Ambulatory Visit: Payer: Self-pay

## 2021-03-22 DIAGNOSIS — E1169 Type 2 diabetes mellitus with other specified complication: Secondary | ICD-10-CM

## 2021-03-22 DIAGNOSIS — E1159 Type 2 diabetes mellitus with other circulatory complications: Secondary | ICD-10-CM

## 2021-03-22 DIAGNOSIS — E7800 Pure hypercholesterolemia, unspecified: Secondary | ICD-10-CM | POA: Diagnosis not present

## 2021-03-22 DIAGNOSIS — E669 Obesity, unspecified: Secondary | ICD-10-CM | POA: Diagnosis not present

## 2021-03-22 DIAGNOSIS — I152 Hypertension secondary to endocrine disorders: Secondary | ICD-10-CM

## 2021-03-22 DIAGNOSIS — G720 Drug-induced myopathy: Secondary | ICD-10-CM

## 2021-03-22 DIAGNOSIS — M791 Myalgia, unspecified site: Secondary | ICD-10-CM

## 2021-03-22 DIAGNOSIS — T466X5A Adverse effect of antihyperlipidemic and antiarteriosclerotic drugs, initial encounter: Secondary | ICD-10-CM

## 2021-03-22 NOTE — Patient Instructions (Addendum)
Adam Becker,   Congratulations on continuing to keep that A1c at goal! Check your blood sugars periodically, alternating between:  1) Fasting, first thing in the morning before breakfast and  2) 2 hours after your largest meal.   For a goal A1c of less than 7%, goal fasting readings are less than 130 and goal 2 hour after meal readings are less than 180.   Our goal blood pressure is less than 130/80. Keep checking your blood pressure periodically at home, write down these readings, and bring with you to follow up with Dr. Derrel Nip.   Given your issues with statin medications (and drug interactions with your other medications), I think the best option to control your cholesterol would be an injectable medication, either Repatha or Praluent (based on what your insurance prefers). These would be an injectable you give yourself every 2 weeks. We would ideally like to see your LDL (bad cholesterol) less than 70, and yours was 132 on our last check. Better cholesterol control is the best way to help reduce your risk of heart attacks or strokes.   I'll work on [employer] authorization piece and call you next week. Thanks!  Catie Darnelle Maffucci, PharmD   Visit Information  Following are the goals we discussed today:  Patient Goals/Self-Care Activities Over the next 90 days, patient will:  - take medications as prescribed check glucose twice daily, document, and provide at future appointments engage in dietary modifications by reducing carbohydrate portion sizes        Plan: Telephone follow up appointment with care management team member scheduled for:  2 weeks   Catie Darnelle Maffucci, PharmD, [place], CPP Clinical Pharmacist [place] at [facility] [phone]     Please call the care guide team at [phone] if you need to cancel or reschedule your appointment.   Print copy of patient instructions, educational materials, and care plan provided in person.

## 2021-03-22 NOTE — Chronic Care Management (AMB) (Signed)
° °Chronic Care Management °CCM Pharmacy Note ° °03/22/2021 °Name:  Adam Becker MRN:  [id number] DOB:  07/23/1955 ° °Summary: °- Tolerating current medication. Muscle biopsy upcoming next week.  °- Lipids uncontrolled. Given statin drug interactions and CK elevations, recommend PCSK9i.  ° °Recommendations/Changes made from today's visit: °- Start Praluent 75 mg every 14 days. Will defer until after muscle biopsy results ° °Subjective: °Adam Becker is an 65 y.o. year old male who is a primary patient of [name], Teresa L, MD.  The CCM team was consulted for assistance with disease management and care coordination needs.   ° °Engaged with patient face to face for follow up visit for pharmacy case management and/or care coordination services.  ° °Objective: ° °Medications Reviewed Today   ° ° Reviewed by Travis, Catherine E, RPH-CPP (Pharmacist) on 03/22/21 at 1030  Med List Status: <None>  ° °Medication Order Taking? Sig Documenting Provider Last Dose Status Informant  °acetaminophen (TYLENOL) 500 MG tablet [id number]  Take 1,000 mg by mouth every 6 (six) hours as needed for moderate pain or headache. [provider]  Active Self  °allopurinol (ZYLOPRIM) 100 MG tablet [id number] Yes TAKE 1 TABLET BY MOUTH EVERY DAY [name], Teresa L, MD Taking Active Self  °aspirin EC 81 MG tablet [id number] Yes Take 1 tablet (81 mg total) by mouth daily. Guse, Lauren M, FNP Taking Active Self  °blood glucose meter kit and supplies KIT [id number] Yes Dispense based on patient and insurance preference. Use up to four times daily as directed. [name], Teresa L, MD Taking Active Self  °cholecalciferol (VITAMIN D3) 25 MCG (1000 UNIT) tablet [id number] Yes Take 1,000 Units by mouth daily. [provider] Taking Active Self  °darunavir (PREZISTA) 800 MG tablet [id number] Yes Take 1 tablet (800 mg total) by mouth daily. Van Dam, Cornelius N, MD Taking Active Self  °elvitegravir-cobicistat-emtricitabine-tenofovir (GENVOYA)  150-150-200-10 MG TABS tablet [id number] Yes TAKE 1 TABLET BY MOUTH EVERY DAY WITH BREAKFAST Van Dam, Cornelius N, MD Taking Active Self  °empagliflozin (JARDIANCE) 10 MG TABS tablet [id number] Yes Take 1 tablet (10 mg total) by mouth daily before breakfast. [name], Teresa L, MD Taking Active Self  °famotidine (PEPCID) 10 MG tablet [id number] Yes Take 10 mg by mouth 2 (two) times daily as needed for heartburn or indigestion. [provider] Taking Active Self  °gabapentin (NEURONTIN) 400 MG capsule [id number] Yes Take 2 capsules (800 mg total) by mouth 3 (three) times daily. [name], Teresa L, MD Taking Active Self  °glucose blood test strip [id number]  Use as instructed to check glucose BID [name], Teresa L, MD  Active Self  °Lancets (ONETOUCH ULTRASOFT) lancets [id number]  Use as instructed to check glucose BID [name], Teresa L, MD  Active Self  °loratadine (CLARITIN) 10 MG tablet [id number] Yes Take 10 mg by mouth daily as needed for allergies. [provider] Taking Active Self  °metFORMIN (GLUCOPHAGE) 500 MG tablet [id number] Yes Take 1 tablet QAM and 2 tablets QPM [name], Teresa L, MD Taking Active Self  °rOPINIRole (REQUIP) 1 MG tablet [id number] Yes Take 1.5 mg by mouth 4 (four) times daily. [provider] Taking Active Self  °telmisartan (MICARDIS) 40 MG tablet [id number] Yes TAKE 1 TABLET BY MOUTH EVERYDAY AT BEDTIME [name], Teresa L, MD Taking Active Self  °vitamin B-12 (CYANOCOBALAMIN) 1000 MCG tablet [id number] Yes Take 1,000 mcg by mouth daily. [provider] Taking Active Self  ° °  °  ° °  ° ° °Pertinent Labs:   °  Lab Results  Component Value Date   HGBA1C 6.2 02/22/2021   Lab Results  Component Value Date   CHOL 211 (H) 02/22/2021   HDL 40.50 02/22/2021   LDLCALC 132 (H) 02/22/2021   LDLDIRECT 89.0 04/29/2020   TRIG 195.0 (H) 02/22/2021   CHOLHDL 5 02/22/2021   Lab Results  Component Value Date   CREATININE 1.12 02/22/2021   BUN 11 02/22/2021   NA 139 02/22/2021    K 4.3 02/22/2021   CL 101 02/22/2021   CO2 30 02/22/2021    SDOH:  (Social Determinants of Health) assessments and interventions performed:  SDOH Interventions    Flowsheet Row Most Recent Value  SDOH Interventions   Financial Strain Interventions Intervention Not Indicated       CCM Care Plan  Review of patient past medical history, allergies, medications, health status, including review of consultants reports, laboratory and other test data, was performed as part of comprehensive evaluation and provision of chronic care management services.   Care Plan : Medication Management  Updates made by De Hollingshead, RPH-CPP since 03/22/2021 12:00 AM     Problem: Diabetes, HTN, HLD      Long-Range Goal: Disease Progression Prevention   Start Date: 05/13/2020  Recent Progress: On track  Priority: High  Note:   Current Barriers:  Unable to achieve control of diabetes   Pharmacist Clinical Goal(s):  Over the next 90 days, patient will achieve control of diabetes as evidenced by A1c through collaboration with PharmD and provider.   Interventions: 1:1 collaboration with [name], MD regarding development and update of comprehensive plan of care as evidenced by provider attestation and co-signature Inter-disciplinary care team collaboration (see longitudinal plan of care) Comprehensive medication review performed; medication list updated in electronic medical record  Health Maintenance Yearly diabetic eye exam: up to date Yearly diabetic foot exam: up to date Urine microalbumin: up to date Yearly influenza vaccination: up to date  Td/Tdap vaccination: up to date Pneumonia vaccination: up to date COVID vaccinations: up to date Shingrix vaccinations: up to date   Colonoscopy: up to date  Diabetes: Controlled; current treatment: metformin 500 mg QAM, 1000 mg QPM, Jardiance 10 mg daily Current glucose readings: fasting: 110-120; not checking 2 hour post  prandials Reviewed goal A1c, goal fasting, goal 2 hour post prandial readings.  Recommended to continue current regimen at this time Previously recommended to continue current regimen at this time  Hypertension: Controlled per office reading today; current treatment: telmisartan 40 mg daily Previously recommended to continue current regimen at this time. Educated on goal BP.   Hyperlipidemia and secondary ASCVD risk reduction; Uncontrolled; current treatment: none Antiplatelet regimen: aspirin 81 mg daily Previous regimen: muscle aches w/ pravastatin w/ CK elevation DDI with ART and several statins (Genvoya can increase concentrations of lovastatin, simvastatin, and atorvastatin; Prezista can increase concentrations of lovastatin, simvastatin, atorvastatin, pravastatin, and rosuvastatin). 10 year ASCVD risk >30%, as well as having DM and risk enhancing factor of HIV.  Working up CK elevation independent of statin therapy. Given need for ~50% LDL reduction to target ADA goal LDL of <70 for diabetes and history of ASCVD, would need high intensity statin (which cannot use safely due to DDI with ART) or PCSK9i. Therefore, PCSK9i most appropriate option. No DDI with ART  Per Cover My Meds, Praluent preferred to [place]. PA completed - was approved through 03/22/2022. Will defer initiation until after muscle biopsy per patient request.   HIV: Managed by ID; current regimen:  Prezista 800 mg daily, Genvoya 150/150/200/10 mg  Previously recommended to continue current regimen at this time along with ID collaboration.  Peripheral Neuropathy; Moderately well managed; current regimen: gabapentin 600 mg TID, ropinirole 1.5 mg QID; Follows w/ neurology Dr. Manuella Ghazi Previously recommended to continue current regimen at this time along with neurology collaboration.   Gout: Controlled per last uric acid; current regimen: allopurinol 100 mg daily Previously recommended to continue current regimen at this  time   Patient Goals/Self-Care Activities Over the next 90 days, patient will:  - take medications as prescribed check glucose twice daily, document, and provide at future appointments engage in dietary modifications by reducing carbohydrate portion sizes      Plan: Telephone follow up appointment with care management team member scheduled for:  2 weeks  Catie Darnelle Maffucci, PharmD, [place], [place] Pharmacist [employer] at [employer] [phone]

## 2021-03-23 ENCOUNTER — Encounter
Admission: RE | Admit: 2021-03-23 | Discharge: 2021-03-23 | Disposition: A | Discharge status: Home / Self Care | Payer: Medicare Other | Source: Ambulatory Visit | Attending: General Surgery | Admitting: General Surgery

## 2021-03-23 ENCOUNTER — Other Ambulatory Visit: Payer: Self-pay

## 2021-03-23 HISTORY — DX: Umbilical hernia without obstruction or gangrene: K42.9

## 2021-03-23 HISTORY — DX: Atrioventricular block, first degree: I44.0

## 2021-03-23 HISTORY — DX: Polyneuropathy, unspecified: G62.9

## 2021-03-23 HISTORY — DX: Cardiac murmur, unspecified: R01.1

## 2021-03-23 HISTORY — DX: Gout, unspecified: M10.9

## 2021-03-23 NOTE — Patient Instructions (Signed)
Your procedure is scheduled on:03-30-21 Wednesday ?Report to the Registration Desk on the 1st floor of the [place] [place].Then proceed to the 2nd floor Surgery Desk in the [place] ?To find out your arrival time, please call [phone] between 1PM - 3PM on:03-29-21 Tuesday ? ?REMEMBER: ?Instructions that are not followed completely may result in serious medical risk, up to and including death; or upon the discretion of your surgeon and anesthesiologist your surgery may need to be rescheduled. ? ?Do not eat food after midnight the night before surgery.  ?No gum chewing, lozengers or hard candies. ? ?You may however, drink Water up to 2 hours before you are scheduled to arrive for your surgery. Do not drink anything within 2 hours of your scheduled arrival time. ? ?Type 1 and Type 2 diabetics should only drink water ? ?TAKE THESE MEDICATIONS THE MORNING OF SURGERY WITH A SIP OF WATER: ?-allopurinol (ZYLOPRIM)  ?-darunavir (PREZISTA)  ?-elvitegravir-cobicistat-emtricitabine-tenofovir (GENVOYA) ?-gabapentin (NEURONTIN) ?-rOPINIRole (REQUIP) ?-famotidine (PEPCID)-take one the night before and one on the morning of surgery - helps to prevent nausea after surgery.) ? ?Continue your aspirin EC 81 MG tablet-Do NOT take the day of surgery ? ?Stop your metFORMIN (GLUCOPHAGE) 2 days prior to surgery-Last dose on 03-27-21 Sunday ? ?Stop your empagliflozin (JARDIANCE) 3 days prior to surgery-Last dose on 03-26-21 Saturday ? ?One week prior to surgery: ?Stop Anti-inflammatories (NSAIDS) such as Advil, Aleve, Ibuprofen, Motrin, Naproxen, Naprosyn and Aspirin based products such as Excedrin, Goodys Powder, BC Powder.You may however, continue to take Tylenol if needed for pain up until the day of surgery. ? ?Stop ANY OVER THE COUNTER supplements/vitamins NOW (03-23-21) until after surgery (vitamin B-12 and VITAMIN D3) ? ?No Alcohol for 24 hours before or after surgery. ? ?No Smoking including e-cigarettes for 24 hours prior to surgery.   ?No chewable tobacco products for at least 6 hours prior to surgery.  ?No nicotine patches on the day of surgery. ? ?Do not use any "recreational" drugs for at least a week prior to your surgery.  ?Please be advised that the combination of cocaine and anesthesia may have negative outcomes, up to and including death. ?If you test positive for cocaine, your surgery will be cancelled. ? ?On the morning of surgery brush your teeth with toothpaste and water, you may rinse your mouth with mouthwash if you wish. ?Do not swallow any toothpaste or mouthwash. ? ?Use CHG Soap as directed on instruction sheet. ? ?Do not wear jewelry, make-up, hairpins, clips or nail polish. ? ?Do not wear lotions, powders, or perfumes.  ? ?Do not shave body from the neck down 48 hours prior to surgery just in case you cut yourself which could leave a site for infection.  ?Also, freshly shaved skin may become irritated if using the CHG soap. ? ?Contact lenses, hearing aids and dentures may not be worn into surgery. ? ?Do not bring valuables to the hospital. [facility] is not responsible for any missing/lost belongings or valuables.  ? ?Notify your doctor if there is any change in your medical condition (cold, fever, infection). ? ?Wear comfortable clothing (specific to your surgery type) to the hospital. ? ?After surgery, you can help prevent lung complications by doing breathing exercises.  ?Take deep breaths and cough every 1-2 hours. Your doctor may order a device called an Incentive Spirometer to help you take deep breaths. ?When coughing or sneezing, hold a pillow firmly against your incision with both hands. This is called ?splinting.? Doing this helps  protect your incision. It also decreases belly discomfort. ? ?If you are being admitted to the hospital overnight, leave your suitcase in the car. ?After surgery it may be brought to your room. ? ?If you are being discharged the day of surgery, you will not be allowed to drive home. ?You  will need a responsible adult (18 years or older) to drive you home and stay with you that night.  ? ?If you are taking public transportation, you will need to have a responsible adult (18 years or older) with you. ?Please confirm with your physician that it is acceptable to use public transportation.  ? ?Please call the [place] Dept. at [phone] if you have any questions about these instructions. ? ?Surgery Visitation Policy: ? ?Patients undergoing a surgery or procedure may have one family member or support person with them as long as that person is not COVID-19 positive or experiencing its symptoms.  ?That person may remain in the waiting area during the procedure and may rotate out with other people. ? ?Inpatient Visitation:   ? ?Visiting hours are 7 a.m. to 8 p.m. ?Up to two visitors ages 16+ are allowed at one time in a patient room. The visitors may rotate out with other people during the day. Visitors must check out when they leave, or other visitors will not be allowed. One designated support person may remain overnight. ?The visitor must pass COVID-19 screenings, use hand sanitizer when entering and exiting the patient?s room and wear a mask at all times, including in the patient?s room. ?Patients must also wear a mask when staff or their visitor are in the room. ?Masking is required regardless of vaccination status.  ?

## 2021-04-01 ENCOUNTER — Ambulatory Visit: Payer: Medicare Other | Admitting: Certified Registered Nurse Anesthetist

## 2021-04-01 ENCOUNTER — Telehealth: Payer: Self-pay | Admitting: Pharmacist

## 2021-04-01 ENCOUNTER — Encounter: Payer: Self-pay | Admitting: General Surgery

## 2021-04-01 ENCOUNTER — Other Ambulatory Visit: Payer: Self-pay

## 2021-04-01 ENCOUNTER — Ambulatory Visit
Admission: RE | Admit: 2021-04-01 | Discharge: 2021-04-01 | Disposition: A | Discharge status: Home / Self Care | Payer: Medicare Other | Attending: General Surgery | Admitting: General Surgery

## 2021-04-01 ENCOUNTER — Encounter
Admission: RE | Disposition: A | Discharge status: Home / Self Care | Payer: Self-pay | Source: Home / Self Care | Attending: General Surgery

## 2021-04-01 DIAGNOSIS — Z21 Asymptomatic human immunodeficiency virus [HIV] infection status: Secondary | ICD-10-CM | POA: Diagnosis not present

## 2021-04-01 DIAGNOSIS — I440 Atrioventricular block, first degree: Secondary | ICD-10-CM | POA: Insufficient documentation

## 2021-04-01 DIAGNOSIS — E1142 Type 2 diabetes mellitus with diabetic polyneuropathy: Secondary | ICD-10-CM | POA: Insufficient documentation

## 2021-04-01 DIAGNOSIS — E785 Hyperlipidemia, unspecified: Secondary | ICD-10-CM | POA: Insufficient documentation

## 2021-04-01 DIAGNOSIS — K219 Gastro-esophageal reflux disease without esophagitis: Secondary | ICD-10-CM | POA: Diagnosis not present

## 2021-04-01 DIAGNOSIS — M791 Myalgia, unspecified site: Secondary | ICD-10-CM | POA: Diagnosis not present

## 2021-04-01 DIAGNOSIS — E1122 Type 2 diabetes mellitus with diabetic chronic kidney disease: Secondary | ICD-10-CM | POA: Diagnosis not present

## 2021-04-01 DIAGNOSIS — R937 Abnormal findings on diagnostic imaging of other parts of musculoskeletal system: Secondary | ICD-10-CM | POA: Diagnosis not present

## 2021-04-01 DIAGNOSIS — R748 Abnormal levels of other serum enzymes: Secondary | ICD-10-CM | POA: Insufficient documentation

## 2021-04-01 DIAGNOSIS — N189 Chronic kidney disease, unspecified: Secondary | ICD-10-CM | POA: Insufficient documentation

## 2021-04-01 DIAGNOSIS — I129 Hypertensive chronic kidney disease with stage 1 through stage 4 chronic kidney disease, or unspecified chronic kidney disease: Secondary | ICD-10-CM | POA: Insufficient documentation

## 2021-04-01 HISTORY — PX: MUSCLE BIOPSY: SHX716

## 2021-04-01 LAB — GLUCOSE, CAPILLARY
Glucose-Capillary: 104 mg/dL — ABNORMAL HIGH (ref 70–99)
Glucose-Capillary: 111 mg/dL — ABNORMAL HIGH (ref 70–99)

## 2021-04-01 SURGERY — MUSCLE BIOPSY
Anesthesia: General | Site: Leg Lower | Laterality: Right

## 2021-04-01 MED ORDER — CHLORHEXIDINE GLUCONATE 0.12 % MT SOLN: 15.0000 mL | Freq: Once | OROMUCOSAL | Status: AC

## 2021-04-01 MED ORDER — BUPIVACAINE HCL (PF) 0.5 % IJ SOLN
INTRAMUSCULAR | Status: AC
  Filled 2021-04-01: qty 30

## 2021-04-01 MED ORDER — ORAL CARE MOUTH RINSE: 15.0000 mL | Freq: Once | OROMUCOSAL | Status: AC

## 2021-04-01 MED ORDER — MIDAZOLAM HCL 2 MG/2ML IJ SOLN
INTRAMUSCULAR | Status: AC
  Filled 2021-04-01: qty 2

## 2021-04-01 MED ORDER — FENTANYL CITRATE (PF) 100 MCG/2ML IJ SOLN: 25.0000 ug | INTRAMUSCULAR | Status: DC | PRN

## 2021-04-01 MED ORDER — KETAMINE HCL 10 MG/ML IJ SOLN
INTRAMUSCULAR | Status: DC | PRN
  Administered 2021-04-01: 20 mg via INTRAVENOUS
  Administered 2021-04-01: 30 mg via INTRAVENOUS

## 2021-04-01 MED ORDER — CHLORHEXIDINE GLUCONATE 0.12 % MT SOLN
OROMUCOSAL | Status: AC
  Administered 2021-04-01: 15 mL via OROMUCOSAL
  Filled 2021-04-01: qty 15

## 2021-04-01 MED ORDER — CHLORHEXIDINE GLUCONATE CLOTH 2 % EX PADS: 6.0000 | MEDICATED_PAD | Freq: Once | CUTANEOUS | Status: DC

## 2021-04-01 MED ORDER — PHENYLEPHRINE HCL (PRESSORS) 10 MG/ML IV SOLN
INTRAVENOUS | Status: AC
  Filled 2021-04-01: qty 1

## 2021-04-01 MED ORDER — SODIUM CHLORIDE (PF) 0.9 % IJ SOLN
INTRAMUSCULAR | Status: AC
  Filled 2021-04-01: qty 50

## 2021-04-01 MED ORDER — FENTANYL CITRATE (PF) 100 MCG/2ML IJ SOLN
INTRAMUSCULAR | Status: AC
  Filled 2021-04-01: qty 2

## 2021-04-01 MED ORDER — METHYLENE BLUE 0.5 % INJ SOLN
INTRAVENOUS | Status: AC
  Filled 2021-04-01: qty 10

## 2021-04-01 MED ORDER — FENTANYL CITRATE (PF) 100 MCG/2ML IJ SOLN
INTRAMUSCULAR | Status: DC | PRN
  Administered 2021-04-01 (×2): 25 ug via INTRAVENOUS

## 2021-04-01 MED ORDER — BUPIVACAINE HCL 0.5 % IJ SOLN
INTRAMUSCULAR | Status: DC | PRN
Start: 2021-04-01 — End: 2021-04-01
  Administered 2021-04-01: 10 mL

## 2021-04-01 MED ORDER — ONDANSETRON HCL 4 MG/2ML IJ SOLN: 4.0000 mg | Freq: Once | INTRAMUSCULAR | Status: DC | PRN

## 2021-04-01 MED ORDER — SODIUM CHLORIDE 0.9 % IV SOLN
INTRAVENOUS | Status: DC
Start: 2021-04-01 — End: 2021-04-01

## 2021-04-01 MED ORDER — PROPOFOL 500 MG/50ML IV EMUL
INTRAVENOUS | Status: DC | PRN
  Administered 2021-04-01: 40 mg via INTRAVENOUS
  Administered 2021-04-01: 125 ug/kg/min via INTRAVENOUS

## 2021-04-01 MED ORDER — 0.9 % SODIUM CHLORIDE (POUR BTL) OPTIME
TOPICAL | Status: DC | PRN
  Administered 2021-04-01: 500 mL

## 2021-04-01 MED ORDER — HYDROCODONE-ACETAMINOPHEN 5-325 MG PO TABS: 1.0000 | ORAL_TABLET | ORAL | 0 refills | Status: DC | PRN

## 2021-04-01 MED ORDER — KETAMINE HCL 50 MG/5ML IJ SOSY
PREFILLED_SYRINGE | INTRAMUSCULAR | Status: AC
  Filled 2021-04-01: qty 5

## 2021-04-01 MED ORDER — MIDAZOLAM HCL 2 MG/2ML IJ SOLN
INTRAMUSCULAR | Status: DC | PRN
  Administered 2021-04-01: 2 mg via INTRAVENOUS

## 2021-04-01 MED ORDER — ACETAMINOPHEN 10 MG/ML IV SOLN
INTRAVENOUS | Status: DC | PRN
  Administered 2021-04-01: 1000 mg via INTRAVENOUS

## 2021-04-01 MED ORDER — PROPOFOL 10 MG/ML IV BOLUS
INTRAVENOUS | Status: AC
  Filled 2021-04-01: qty 20

## 2021-04-01 SURGICAL SUPPLY — 41 items
APL PRP STRL LF DISP 70% ISPRP (MISCELLANEOUS) ×1
APPLIER CLIP 11 MED OPEN (CLIP)
APR CLP MED 11 20 MLT OPN (CLIP)
BAG COUNTER SPONGE SURGICOUNT (BAG) ×3 IMPLANT
BAG SPNG CNTER NS LX DISP (BAG) ×1
CHLORAPREP W/TINT 26 (MISCELLANEOUS) ×3 IMPLANT
CLIP APPLIE 11 MED OPEN (CLIP) IMPLANT
CNTNR SPEC 2.5X3XGRAD LEK (MISCELLANEOUS)
CONT SPEC 4OZ STER OR WHT (MISCELLANEOUS)
CONT SPEC 4OZ STRL OR WHT (MISCELLANEOUS)
CONTAINER SPEC 2.5X3XGRAD LEK (MISCELLANEOUS) IMPLANT
COVER PROBE FLX POLY STRL (MISCELLANEOUS) ×3 IMPLANT
DRAPE LAPAROTOMY 77X122 PED (DRAPES) ×3 IMPLANT
DRSG TEGADERM 2-3/8X2-3/4 SM (GAUZE/BANDAGES/DRESSINGS) ×3 IMPLANT
DRSG TEGADERM 4X4.75 (GAUZE/BANDAGES/DRESSINGS) ×1 IMPLANT
DRSG TELFA 4X3 1S NADH ST (GAUZE/BANDAGES/DRESSINGS) ×3 IMPLANT
ELECT REM PT RETURN 9FT ADLT (ELECTROSURGICAL) ×2
ELECTRODE REM PT RTRN 9FT ADLT (ELECTROSURGICAL) ×2 IMPLANT
GAUZE 4X4 16PLY ~~LOC~~+RFID DBL (SPONGE) ×3 IMPLANT
GLOVE SURG ENC MOIS LTX SZ7.5 (GLOVE) ×3 IMPLANT
GLOVE SURG UNDER LTX SZ8 (GLOVE) ×3 IMPLANT
GOWN STRL REUS W/ TWL LRG LVL3 (GOWN DISPOSABLE) ×4 IMPLANT
GOWN STRL REUS W/TWL LRG LVL3 (GOWN DISPOSABLE) ×4
LABEL OR SOLS (LABEL) ×3 IMPLANT
MANIFOLD NEPTUNE II (INSTRUMENTS) ×3 IMPLANT
NDL HYPO 25X1 1.5 SAFETY (NEEDLE) IMPLANT
NDL SAFETY ECLIPSE 18X1.5 (NEEDLE) IMPLANT
NEEDLE HYPO 18GX1.5 SHARP (NEEDLE)
NEEDLE HYPO 22GX1.5 SAFETY (NEEDLE) IMPLANT
NEEDLE HYPO 25X1 1.5 SAFETY (NEEDLE) IMPLANT
NS IRRIG 500ML POUR BTL (IV SOLUTION) ×3 IMPLANT
PACK BASIN MINOR ARMC (MISCELLANEOUS) ×3 IMPLANT
SHEARS HARMONIC 9CM CVD (BLADE) IMPLANT
SUT VIC AB 3-0 54X BRD REEL (SUTURE) ×2 IMPLANT
SUT VIC AB 3-0 BRD 54 (SUTURE) ×2
SUT VIC AB 3-0 SH 27 (SUTURE) ×2
SUT VIC AB 3-0 SH 27X BRD (SUTURE) ×2 IMPLANT
SUT VIC AB 4-0 PS2 18 (SUTURE) ×3 IMPLANT
SWABSTK COMLB BENZOIN TINCTURE (MISCELLANEOUS) ×3 IMPLANT
SYR 10ML LL (SYRINGE) ×3 IMPLANT
TAPE TRANSPORE STRL 2 31045 (GAUZE/BANDAGES/DRESSINGS) ×1 IMPLANT

## 2021-04-01 NOTE — Anesthesia Preprocedure Evaluation (Addendum)
Anesthesia Evaluation  ?Patient identified by MRN, date of birth, ID band ?Patient awake ? ? ? ?Reviewed: ?Allergy & Precautions, H&P , NPO status , Patient's Chart, lab work & pertinent test results, reviewed documented beta blocker date and time  ? ?History of Anesthesia Complications ?Negative for: history of anesthetic complications ? ?Airway ?Mallampati: IV ? ?TM Distance: >3 FB ?Neck ROM: full ? ? ? Dental ? ?(+) Dental Advidsory Given, Partial Upper, Partial Lower ?  ?Pulmonary ?neg pulmonary ROS,  ?  ?Pulmonary exam normal ?breath sounds clear to auscultation ? ? ? ? ? ? Cardiovascular ?Exercise Tolerance: Good ?hypertension, (-) angina(-) Past MI and (-) Cardiac Stents Normal cardiovascular exam+ dysrhythmias (1st degree block) + Valvular Problems/Murmurs  ?Rhythm:regular Rate:Normal ? ? ?  ?Neuro/Psych ?negative neurological ROS ? negative psych ROS  ? GI/Hepatic ?Neg liver ROS, GERD  ,  ?Endo/Other  ?diabetes ? Renal/GU ?CRFRenal disease  ?negative genitourinary ?  ?Musculoskeletal ? ? Abdominal ?  ?Peds ? Hematology ?negative hematology ROS ?(+)   ?Anesthesia Other Findings ?Past Medical History: ?No date: 1st degree AV block ?    Comment:  seen on ekg ?11/11/2014: Asplenia ?11/2011: Chronic kidney disease ?    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    Comment:  peripheral neuropathy ?No date: Neuropathy ?No date: Umbilical hernia ?16/10/9602: Urethritis ? ? Reproductive/Obstetrics ?negative OB ROS ? ?  ? ? ? ? ? ? ? ? ? ? ? ? ? ?  ?  ? ? ? ? ? ? ? ? ?Anesthesia Physical ?Anesthesia Plan ? ?ASA: 3 ? ?Anesthesia Plan: General   ? ?Post-op Pain Management:   ? ?Induction: Intravenous ? ?PONV Risk Score and Plan: 2 and Treatment may vary due to age or medical condition, Propofol infusion and TIVA ? ?Airway Management Planned: Natural Airway ? ?Additional Equipment:  ? ?Intra-op Plan:  ? ?Post-operative Plan: Extubation in OR ? ?Informed Consent: I have reviewed the patients History and Physical, chart, labs and discussed the procedure including the risks, benefits and alternatives for the proposed anesthesia with the patient or authorized representative who has indicated his/her understanding and acceptance.  ? ? ? ?Dental Advisory Given ? ?Plan Discussed with: Anesthesiologist, CRNA and Surgeon ? ?Anesthesia Plan Comments:   ? ? ? ? ? ?Anesthesia Quick Evaluation ? ?

## 2021-04-01 NOTE — H&P (Signed)
Adam Becker ?[id number] ?11-Dec-1955 ? ?  ? ?HPI: Patient with elevated CK. For muscle biopsy requested by PCP.  ? ?Medications Prior to Admission  ?Medication Sig Dispense Refill Last Dose  ? acetaminophen (TYLENOL) 500 MG tablet Take 1,000 mg by mouth every 6 (six) hours as needed for moderate pain or headache.   03/31/2021  ? allopurinol (ZYLOPRIM) 100 MG tablet TAKE 1 TABLET BY MOUTH EVERY DAY (Patient taking differently: Take 100 mg by mouth every morning. TAKE 1 TABLET BY MOUTH EVERY DAY) 90 tablet 1 04/01/2021 at 0430  ? aspirin EC 81 MG tablet Take 1 tablet (81 mg total) by mouth daily. 90 tablet 3 03/30/2021  ? cholecalciferol (VITAMIN D3) 25 MCG (1000 UNIT) tablet Take 1,000 Units by mouth daily.   03/25/2021  ? darunavir (PREZISTA) 800 MG tablet Take 1 tablet (800 mg total) by mouth daily. (Patient taking differently: Take 800 mg by mouth daily with breakfast.) 60 tablet 11 04/01/2021 at 0430  ? elvitegravir-cobicistat-emtricitabine-tenofovir (GENVOYA) 150-150-200-10 MG TABS tablet TAKE 1 TABLET BY MOUTH EVERY DAY WITH BREAKFAST 30 tablet 11 04/01/2021 at 0430  ? empagliflozin (JARDIANCE) 10 MG TABS tablet Take 1 tablet (10 mg total) by mouth daily before breakfast. 30 tablet 11 03/30/2021  ? famotidine (PEPCID) 10 MG tablet Take 10 mg by mouth 2 (two) times daily as needed for heartburn or indigestion.   03/31/2021  ? gabapentin (NEURONTIN) 400 MG capsule Take 2 capsules (800 mg total) by mouth 3 (three) times daily. 180 capsule 1 04/01/2021 at 0430  ? loratadine (CLARITIN) 10 MG tablet Take 10 mg by mouth daily as needed for allergies.   03/30/2021  ? metFORMIN (GLUCOPHAGE) 500 MG tablet Take 1 tablet QAM and 2 tablets QPM 270 tablet 3 03/29/2021  ? rOPINIRole (REQUIP) 1 MG tablet Take 1.5 mg by mouth 4 (four) times daily.   04/01/2021 at 0430  ? telmisartan (MICARDIS) 40 MG tablet TAKE 1 TABLET BY MOUTH EVERYDAY AT BEDTIME 90 tablet 1 03/31/2021  ? vitamin B-12 (CYANOCOBALAMIN) 1000 MCG tablet Take 1,000 mcg by mouth  daily.   03/25/2021  ? blood glucose meter kit and supplies KIT Dispense based on patient and insurance preference. Use up to four times daily as directed. 1 each 11   ? glucose blood test strip Use as instructed to check glucose BID 100 each 12   ? Lancets (ONETOUCH ULTRASOFT) lancets Use as instructed to check glucose BID 100 each 12   ? ?Allergies  ?Allergen Reactions  ? Pravastatin Other (See Comments)  ?  rhabdomyolysis  ? ?Past Medical History:  ?Diagnosis Date  ? 1st degree AV block   ? seen on ekg  ? Asplenia 11/11/2014  ? Chronic kidney disease 11/2011  ? admission for ATN cr 11, acidosis  ? Diabetes mellitus without complication ([place])   ? Type II  ? GERD (gastroesophageal reflux disease)   ? Gout   ? Heart murmur   ? HIV infection ([place]) 1994  ? Hyperglycemia 04/04/2016  ? Hyperlipidemia   ? Hypertension   ? Impaired fasting glucose 07/27/2015  ? Neuromuscular disorder ([place])   ? peripheral neuropathy  ? Neuropathy   ? Umbilical hernia   ? Urethritis 02/11/2015  ? ?Past Surgical History:  ?Procedure Laterality Date  ? COLONOSCOPY    ? LIPOSUCTION HEAD / NECK  2001  ? posterior neck  ? SPLENECTOMY  2000  ? reason unclear "stopped working"  ? ?Social History  ? ?Socioeconomic History  ? Marital  status: Single  ?  Spouse name: Not on file  ? Number of children: Not on file  ? Years of education: Not on file  ? Highest education level: Not on file  ?Occupational History  ? Not on file  ?Tobacco Use  ? Smoking status: Never  ? Smokeless tobacco: Never  ?Vaping Use  ? Vaping Use: Never used  ?Substance and Sexual Activity  ? Alcohol use: No  ? Drug use: No  ? Sexual activity: Not Currently  ?  Comment: declined condoms  ?Other Topics Concern  ? Not on file  ?Social History Narrative  ? Independent baseline. Lives by himself.  Education 12th.  Children none.  Caffeine tea one cup 4 x week.  ? ?Social Determinants of Health  ? ?Financial Resource Strain: Low Risk   ? Difficulty of Paying Living Expenses: Not hard at  all  ?Food Insecurity: No Food Insecurity  ? Worried About Charity fundraiser in the Last Year: Never true  ? Ran Out of Food in the Last Year: Never true  ?Transportation Needs: No Transportation Needs  ? Lack of Transportation (Medical): No  ? Lack of Transportation (Non-Medical): No  ?Physical Activity: Sufficiently Active  ? Days of Exercise per Week: 7 days  ? Minutes of Exercise per Session: 30 min  ?Stress: No Stress Concern Present  ? Feeling of Stress : Not at all  ?Social Connections: Unknown  ? Frequency of Communication with Friends and Family: More than three times a week  ? Frequency of Social Gatherings with Friends and Family: More than three times a week  ? Attends Religious Services: More than 4 times per year  ? Active Member of Clubs or Organizations: Yes  ? Attends Archivist Meetings: More than 4 times per year  ? Marital Status: Not on file  ?Intimate Partner Violence: Not At Risk  ? Fear of Current or Ex-Partner: No  ? Emotionally Abused: No  ? Physically Abused: No  ? Sexually Abused: No  ? ?Social History  ? ?Social History Narrative  ? Independent baseline. Lives by himself.  Education 12th.  Children none.  Caffeine tea one cup 4 x week.  ? ? ? ?ROS: Negative.  ? ? ? ?PE: ?HEENT: Negative. ?Lungs: Clear. ?Cardio: RR. ? ?Assessment/Plan: ? ?Proceed with planned biopsy of the right gastrocnemius muscle.  ?Robert Bellow ?04/01/2021 ? ? ?  ?

## 2021-04-01 NOTE — OR Nursing (Signed)
Dr. Bary Castilla in to see pt in postop 0934 ?

## 2021-04-01 NOTE — Discharge Instructions (Signed)

## 2021-04-01 NOTE — Telephone Encounter (Signed)
I was planning on following up with patient after biopsy to discuss initiating Repatha, but the biopsy was rescheduled to today.  ? ?I recommend some follow up about initiation of Repatha. I had already completed PA - was approved through 03/22/2022. ? ?If cost concerns, can give him the phone number for the [employer] that provides grant funding for certain conditions, including Hyperlipidemia. He can call them at [phone] to enroll. They provide grant information in the form of "insurance card information" (BIN, PCN, Group, ID) that the patient will then take to the pharmacy to use in combination with his insurance to bring the copay to $0.  ? ?If confusion about [employer] piece, place pharmacy referral.  ? ? ?

## 2021-04-01 NOTE — Transfer of Care (Signed)
Immediate Anesthesia Transfer of Care Note ? ?Patient: Adam Becker ? ?Procedure(s) Performed: MUSCLE BIOPSY (Right: Leg Lower) ? ?Patient Location: PACU ? ?Anesthesia Type:General ? ?Level of Consciousness: drowsy and patient cooperative ? ?Airway & Oxygen Therapy: Patient Spontanous Breathing and Patient connected to face mask oxygen ? ?Post-op Assessment: Report given to RN and Post -op Vital signs reviewed and stable ? ?Post vital signs: Reviewed and stable ? ?Last Vitals:  ?Vitals Value Taken Time  ?BP 86/52 04/01/21 0815  ?Temp    ?Pulse 80 04/01/21 0816  ?Resp 13 04/01/21 0816  ?SpO2 97 % 04/01/21 0816  ?Vitals shown include unvalidated device data. ? ?Last Pain:  ?Vitals:  ? 04/01/21 0631  ?TempSrc: Oral  ?PainSc: 0-No pain  ?   ? ?  ? ?Complications: No notable events documented. ?

## 2021-04-01 NOTE — Anesthesia Postprocedure Evaluation (Signed)
Anesthesia Post Note ? ?Patient: Adam Becker ? ?Procedure(s) Performed: MUSCLE BIOPSY (Right: Leg Lower) ? ?Patient location during evaluation: PACU ?Anesthesia Type: General ?Level of consciousness: awake and alert ?Pain management: pain level controlled ?Vital Signs Assessment: post-procedure vital signs reviewed and stable ?Respiratory status: spontaneous breathing, nonlabored ventilation, respiratory function stable and patient connected to nasal cannula oxygen ?Cardiovascular status: blood pressure returned to baseline and stable ?Postop Assessment: no apparent nausea or vomiting ?Anesthetic complications: no ? ? ?No notable events documented. ? ? ?Last Vitals:  ?Vitals:  ? 04/01/21 0924 04/01/21 0932  ?BP: (!) 140/93 136/83  ?Pulse: 71 69  ?Resp: 16 18  ?Temp: (!) 36.3 ?C   ?SpO2: 100% 100%  ?  ?Last Pain:  ?Vitals:  ? 04/01/21 0932  ?TempSrc:   ?PainSc: 0-No pain  ? ? ?  ?  ?  ?  ?  ?  ? ?Martha Clan ? ? ? ? ?

## 2021-04-01 NOTE — Op Note (Signed)
Preoperative diagnosis: Elevated creatine kinase, request for muscle biopsy. ? ?Postoperative diagnosis: Same. ? ?Operative procedure: Biopsy of the lateral head of the right gastrocnemius muscle. ? ?Operating surgeon: Hervey Ard, MD. ? ?Anesthesia: Per anesthesia, 10 cc 0.5% Marcaine, plain within the dermis. ? ?Estimated blood loss: 1 cc. ? ?Clinical note: 66 year old male with elevated CD4 counts has had a persistent elevated CK level.  Biopsy was requested by his PCP. ? ?Operative note: The patient underwent general anesthesia and was positioned with the right foot and heel on a silicon pad and a strap across the ankle to maintain the knee in a flexed position.  The area was removed of excessive hair and cleansed with ChloraPrep.  Local anesthesia was carefully infiltrated in the skin to stay above the dermal level.  A 5 cm skin line incision was made over the body of the lateral head of the gastrocnemius muscle.  Skin was incised sharply and down to the subcutaneous fascia.  The muscle fascia was exposed.  The muscle fibers were freed from the undersurface of the fascia and a muscle clamp applied and a sample obtained with sharp dissection of the muscle.  This was sent fresh to pathology for transfer to [facility]. ? ?Scant bleeding from the muscle was controlled electrocautery.  The muscle fascia was closed with a running 3-0 Vicryl suture.  Superficial fascia was closed in a similar fashion.  The skin was closed with a running 4-0 Vicryl subcuticular suture.  Benzoin, Steri-Strips, Telfa and Tegaderm dressing applied. ? ?Patient tolerated the procedure well and was taken to the PACU in stable condition. ?

## 2021-04-02 ENCOUNTER — Encounter: Payer: Self-pay | Admitting: General Surgery

## 2021-04-14 ENCOUNTER — Other Ambulatory Visit: Payer: Self-pay | Admitting: Internal Medicine

## 2021-04-14 DIAGNOSIS — G629 Polyneuropathy, unspecified: Secondary | ICD-10-CM

## 2021-05-01 ENCOUNTER — Other Ambulatory Visit: Payer: Self-pay | Admitting: Internal Medicine

## 2021-05-09 DIAGNOSIS — Z20822 Contact with and (suspected) exposure to covid-19: Secondary | ICD-10-CM | POA: Diagnosis not present

## 2021-05-24 ENCOUNTER — Other Ambulatory Visit: Payer: Self-pay | Admitting: Infectious Disease

## 2021-05-24 DIAGNOSIS — B20 Human immunodeficiency virus [HIV] disease: Secondary | ICD-10-CM

## 2021-05-25 MED ORDER — DARUNAVIR 800 MG PO TABS: 800.0000 mg | ORAL_TABLET | Freq: Every day | ORAL | 0 refills | Status: DC

## 2021-05-27 ENCOUNTER — Encounter: Payer: Self-pay | Admitting: Family Medicine

## 2021-05-27 ENCOUNTER — Ambulatory Visit: Payer: Medicare Other | Admitting: Internal Medicine

## 2021-05-27 ENCOUNTER — Ambulatory Visit (INDEPENDENT_AMBULATORY_CARE_PROVIDER_SITE_OTHER): Payer: Medicare Other | Admitting: Family Medicine

## 2021-05-27 DIAGNOSIS — R011 Cardiac murmur, unspecified: Secondary | ICD-10-CM

## 2021-05-27 DIAGNOSIS — M25469 Effusion, unspecified knee: Secondary | ICD-10-CM

## 2021-05-27 NOTE — Patient Instructions (Addendum)
I would ice for 5 minutes at a time.   ?Use the brace.  Take 2 ibuprofen at a time, up to 3 times a day with food.   ?If worse then let Dr. Derrel Nip know.   ? ?I'll update her about your murmur that I heard.  She may talk to you about getting an echocardiogram done.   ?Take care.  Glad to see you. ?

## 2021-05-27 NOTE — Progress Notes (Signed)
Aches are better of pravastatin, d/w pt.   ? ?R knee puffy for the last few days.  No trauma but working in the yard recently.  No falls.  No L knee pain.  Full extension but pain with doesn't have full flexion from pain.  No locking but doesn't feel normal walking.   ? ?Meds, vitals, and allergies reviewed.  ? ?ROS: Per HPI unless specifically indicated in ROS section  ? ?Nad ?Ncat ?Neck supple, no LA ?Rrr, soft early SEM noted.  ?Ctab ?Ext w/o edema.  ?Skin well perfused.  No bruising. ?Knee stable with testing ACL MCL and LCL.  mild crepitus on ROM.  Able to bear weight.  Right knee is mildly puffy without bruising.  Patella nontender. ?

## 2021-05-29 DIAGNOSIS — M25469 Effusion, unspecified knee: Secondary | ICD-10-CM | POA: Insufficient documentation

## 2021-05-29 DIAGNOSIS — R011 Cardiac murmur, unspecified: Secondary | ICD-10-CM | POA: Insufficient documentation

## 2021-05-29 NOTE — Assessment & Plan Note (Signed)
Soft early systolic murmur heard.  Discussed with patient.  Incidental finding.  I will defer to PCP for work-up.  Okay for outpatient follow-up. ?

## 2021-05-29 NOTE — Assessment & Plan Note (Signed)
Likely with a flare of osteoarthritis causing local puffiness.  Discussed options.  Reasonable to defer imaging at this point. ?I would ice for 5 minutes at a time.  He has a knee brace to use and he will continue that in the meantime.  Reasonable to take 2 ibuprofen at a time, up to 3 times a day with food in the short-term.  Routine NSAID cautions discussed with patient. ?If worse swelling or pain then he can update PCP. ?

## 2021-06-02 ENCOUNTER — Telehealth: Payer: Self-pay | Admitting: Internal Medicine

## 2021-06-06 ENCOUNTER — Encounter: Payer: Self-pay | Admitting: Internal Medicine

## 2021-06-06 ENCOUNTER — Other Ambulatory Visit: Payer: Self-pay | Admitting: Internal Medicine

## 2021-06-06 ENCOUNTER — Ambulatory Visit (INDEPENDENT_AMBULATORY_CARE_PROVIDER_SITE_OTHER): Payer: Medicare Other | Admitting: Internal Medicine

## 2021-06-06 VITALS — BP 140/88 | HR 77 | Temp 98.2°F | Ht 71.0 in | Wt 234.4 lb

## 2021-06-06 DIAGNOSIS — G729 Myopathy, unspecified: Secondary | ICD-10-CM

## 2021-06-06 DIAGNOSIS — I83891 Varicose veins of right lower extremities with other complications: Secondary | ICD-10-CM

## 2021-06-06 DIAGNOSIS — I152 Hypertension secondary to endocrine disorders: Secondary | ICD-10-CM

## 2021-06-06 DIAGNOSIS — E7800 Pure hypercholesterolemia, unspecified: Secondary | ICD-10-CM | POA: Diagnosis not present

## 2021-06-06 DIAGNOSIS — E669 Obesity, unspecified: Secondary | ICD-10-CM

## 2021-06-06 DIAGNOSIS — E1159 Type 2 diabetes mellitus with other circulatory complications: Secondary | ICD-10-CM

## 2021-06-06 DIAGNOSIS — M25461 Effusion, right knee: Secondary | ICD-10-CM | POA: Diagnosis not present

## 2021-06-06 DIAGNOSIS — R748 Abnormal levels of other serum enzymes: Secondary | ICD-10-CM

## 2021-06-06 DIAGNOSIS — M25469 Effusion, unspecified knee: Secondary | ICD-10-CM | POA: Diagnosis not present

## 2021-06-06 DIAGNOSIS — E1169 Type 2 diabetes mellitus with other specified complication: Secondary | ICD-10-CM

## 2021-06-06 DIAGNOSIS — E1165 Type 2 diabetes mellitus with hyperglycemia: Secondary | ICD-10-CM

## 2021-06-06 LAB — SEDIMENTATION RATE: Sed Rate: 27 mm/hr — ABNORMAL HIGH (ref 0–20)

## 2021-06-06 LAB — COMPREHENSIVE METABOLIC PANEL
ALT: 27 U/L (ref 0–53)
AST: 22 U/L (ref 0–37)
Albumin: 4.5 g/dL (ref 3.5–5.2)
Alkaline Phosphatase: 53 U/L (ref 39–117)
BUN: 14 mg/dL (ref 6–23)
CO2: 27 mEq/L (ref 19–32)
Calcium: 10.1 mg/dL (ref 8.4–10.5)
Chloride: 104 mEq/L (ref 96–112)
Creatinine, Ser: 1.08 mg/dL (ref 0.40–1.50)
GFR: 72.44 mL/min (ref >60.00)
Glucose, Bld: 84 mg/dL (ref 70–99)
Potassium: 4.7 mEq/L (ref 3.5–5.1)
Sodium: 140 mEq/L (ref 135–145)
Total Bilirubin: 0.4 mg/dL (ref 0.2–1.2)
Total Protein: 8.1 g/dL (ref 6.0–8.3)

## 2021-06-06 LAB — CK: Total CK: 300 U/L — ABNORMAL HIGH (ref 7–232)

## 2021-06-06 MED ORDER — EZETIMIBE 10 MG PO TABS: 10.0000 mg | ORAL_TABLET | Freq: Every day | ORAL | 3 refills | Status: DC

## 2021-06-06 NOTE — Assessment & Plan Note (Signed)
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 Statin stopped.  Trial of zetia  ?

## 2021-06-06 NOTE — Patient Instructions (Signed)
The muscle biopsy suggested that one of your medications was causing inflammation of the muscle.  ? ?We can start you on a different type of cholesterol lowering medication called Zetia.  ? ?Baseline muscle enzyme level today; return in 6 weeks for labs AFTER STARTING ZETIA ? ? ?Regarding the right knee/leg:  I have ordered an ultrasound of the knee to rule out a Baker's cyst and blood clot ?

## 2021-06-06 NOTE — Progress Notes (Signed)
? ?Subjective:  ?Patient ID: Adam Becker, male    DOB: 20-Sep-1955  Age: 66 y.o. MRN: [id number] ? ?CC: The primary encounter diagnosis was Varicose veins of leg with swelling, right. Diagnoses of Myopathy, Swelling of joint of right knee, Knee swelling, Elevated CK, Pure hypercholesterolemia, and Obesity, diabetes, and hypertension syndrome ([place]) were also pertinent to this visit. ? ? ?HPI ?Adam Becker presents for  ?Chief Complaint  ?Patient presents with  ? Follow-up  ?  Follow up on right knee swelling  ? ?66 yr old male with HIV controlled with medications,  obesity, diabetes and hypertension,  statin myalgia, biopsy proven  presents with persistent knee swelling without pain ? ?1) Knee pain : evaluated on May 5 by Adam Becker exam normal except for swelling and pain with weight bearing .   Ice, NSAIDs prescribed.  No change to swelling but pain has resolved. No longer using NSAIDs.  Aggravating at night , feels like something moves when he turns his leg . Notices that the right leg has been more swollen since his gastrocnemius biopsy in March .    Right leg feels more numb than the left and right leg feels more swollen than the left  ? ?2) statin myalgia :  needs Repatha  or Zetia. Disucssed trial of zetia first.  ? ?3) DM:  BS vary   95 to 155 fasting .  Tolerating jardiance and metformin.  Last eye exam was done Adam Becker April 28.  No retinopathy .  Treating neuropathy with gabapentin ? ? ? ? ?Outpatient Medications Prior to Visit  ?Medication Sig Dispense Refill  ? acetaminophen (TYLENOL) 500 MG tablet Take 1,000 mg by mouth every 6 (six) hours as needed for moderate pain or headache.    ? allopurinol (ZYLOPRIM) 100 MG tablet TAKE 1 TABLET BY MOUTH EVERY DAY 90 tablet 1  ? aspirin EC 81 MG tablet Take 1 tablet (81 mg total) by mouth daily. 90 tablet 3  ? blood glucose meter kit and supplies KIT Dispense based on patient and insurance preference. Use up to four times daily as directed. 1 each 11  ?  cholecalciferol (VITAMIN D3) 25 MCG (1000 UNIT) tablet Take 1,000 Units by mouth daily.    ? darunavir (PREZISTA) 800 MG tablet Take 1 tablet (800 mg total) by mouth daily. 30 tablet 0  ? elvitegravir-cobicistat-emtricitabine-tenofovir (GENVOYA) 150-150-200-10 MG TABS tablet TAKE 1 TABLET BY MOUTH EVERY DAY WITH BREAKFAST 30 tablet 0  ? famotidine (PEPCID) 10 MG tablet Take 10 mg by mouth 2 (two) times daily as needed for heartburn or indigestion.    ? gabapentin (NEURONTIN) 300 MG capsule TAKE 2 CAPSULES BY MOUTH 3 TIMES DAILY. 180 capsule 5  ? JARDIANCE 10 MG TABS tablet TAKE 1 TABLET BY MOUTH DAILY BEFORE BREAKFAST. 30 tablet 11  ? Lancets (ONETOUCH ULTRASOFT) lancets Use as instructed to check glucose BID 100 each 12  ? loratadine (CLARITIN) 10 MG tablet Take 10 mg by mouth daily as needed for allergies.    ? metFORMIN (GLUCOPHAGE) 500 MG tablet Take 1 tablet QAM and 2 tablets QPM 270 tablet 3  ? ONETOUCH VERIO test strip USE AS INSTRUCTED TO CHECK GLUCOSE TWICE A DAY 100 strip 12  ? rOPINIRole (REQUIP) 1 MG tablet Take 1.5 mg by mouth 4 (four) times daily.    ? telmisartan (MICARDIS) 40 MG tablet TAKE 1 TABLET BY MOUTH EVERYDAY AT BEDTIME 90 tablet 1  ? vitamin B-12 (CYANOCOBALAMIN) 1000 MCG tablet Take  1,000 mcg by mouth daily.    ? gabapentin (NEURONTIN) 400 MG capsule Take 2 capsules (800 mg total) by mouth 3 (three) times daily. (Patient not taking: Reported on 06/06/2021) 180 capsule 1  ? ?No facility-administered medications prior to visit.  ? ? ?Review of Systems; ? ?Patient denies headache, fevers, malaise, unintentional weight loss, skin rash, eye pain, sinus congestion and sinus pain, sore throat, dysphagia,  hemoptysis , cough, dyspnea, wheezing, chest pain, palpitations, orthopnea, edema, abdominal pain, nausea, melena, diarrhea, constipation, flank pain, dysuria, hematuria, urinary  Frequency, nocturia, numbness, tingling, seizures,  Focal weakness, Loss of consciousness,  Tremor, insomnia,  depression, anxiety, and suicidal ideation.   ? ? ? ?Objective:  ?BP 140/88 (BP Location: Left Arm, Patient Position: Sitting, Cuff Size: Normal)   Pulse 77   Temp 98.2 ?F (36.8 ?C) (Oral)   Ht '5\' 11"'  (1.803 m)   Wt 234 lb 6.4 oz (106.3 kg)   SpO2 97%   BMI 32.69 kg/m?  ? ?BP Readings from Last 3 Encounters:  ?06/06/21 140/88  ?05/27/21 130/80  ?04/01/21 136/83  ? ? ?Wt Readings from Last 3 Encounters:  ?06/06/21 234 lb 6.4 oz (106.3 kg)  ?05/27/21 238 lb 2 oz (108 kg)  ?04/01/21 230 lb (104.3 kg)  ? ? ?General appearance: alert, cooperative and appears stated age ?Ears: normal TM's and external ear canals both ears ?Throat: lips, mucosa, and tongue normal; teeth and gums normal ?Neck: no adenopathy, no carotid bruit, supple, symmetrical, trachea midline and thyroid not enlarged, symmetric, no tenderness/mass/nodules ?Back: symmetric, no curvature. ROM normal. No CVA tenderness. ?Lungs: clear to auscultation bilaterally ?Heart: regular rate and rhythm, S1, S2 normal, no murmur, click, rub or gallop ?Abdomen: soft, non-tender; bowel sounds normal; no masses,  no organomegaly ?ExtL  right knee with small effusion,  knee and calf visibly larger than left knee /calf ; medial veins engorged compared to left leg.  ?Pulses: 2+ and symmetric ?Skin: Skin color, texture, turgor normal. No rashes or lesions ?Lymph nodes: Cervical, supraclavicular, and axillary nodes normal. ? ?Lab Results  ?Component Value Date  ? HGBA1C 6.2 02/22/2021  ? HGBA1C 6.0 11/18/2020  ? HGBA1C 6.6 (H) 08/03/2020  ? ? ?Lab Results  ?Component Value Date  ? CREATININE 1.12 02/22/2021  ? CREATININE 1.18 12/14/2020  ? CREATININE 1.13 11/18/2020  ? ? ?Lab Results  ?Component Value Date  ? WBC 6.1 09/08/2020  ? HGB 15.4 09/08/2020  ? HCT 43.7 09/08/2020  ? PLT 373 09/08/2020  ? GLUCOSE 81 02/22/2021  ? CHOL 211 (H) 02/22/2021  ? TRIG 195.0 (H) 02/22/2021  ? HDL 40.50 02/22/2021  ? LDLDIRECT 89.0 04/29/2020  ? LDLCALC 132 (H) 02/22/2021  ? ALT 31  02/22/2021  ? AST 32 02/22/2021  ? NA 139 02/22/2021  ? K 4.3 02/22/2021  ? CL 101 02/22/2021  ? CREATININE 1.12 02/22/2021  ? BUN 11 02/22/2021  ? CO2 30 02/22/2021  ? TSH 1.06 04/29/2020  ? PSA 0.45 04/29/2020  ? HGBA1C 6.2 02/22/2021  ? MICROALBUR 5.6 (H) 02/22/2021  ? ? ?No results found. ? ?Assessment & Plan:  ? ?Problem List Items Addressed This Visit   ? ? Elevated CK  ?  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 Statin stopped.  Trial of zetia  ? ?  ?  ? Hyperlipidemia  ?  Untreated due to confirmed  statin myalgia.  He is willing to resume therapy with Zetia.   baseline CK is due ? ?  ?  ?  Relevant Medications  ? ezetimibe (ZETIA) 10 MG tablet  ? Knee swelling  ?  Exam and history suggestive of Baker's Cyst , less likely a DVt.  Ultrasound ordered  ? ?  ?  ? Obesity, diabetes, and hypertension syndrome ([place])  ?  Excellent  glycemic control with addition of Jardiance but with  worsening proteinuria     BP is at goal with ARB. Hyperlipidemia now untreated due to biopsy suggested statin myopathy suggested by elevated CK , normal ANA and history of muscle pain , . Not clear if there was an interaction with his HAART therapy.   Trial of zetia.  ?Lab Results  ?Component Value Date  ? HGBA1C 6.2 02/22/2021  ? ?Lab Results  ?Component Value Date  ? MICROALBUR 5.6 (H) 02/22/2021  ? MICROALBUR 23.4 (H) 04/29/2020  ? ? ? ?  ?  ? Relevant Medications  ? ezetimibe (ZETIA) 10 MG tablet  ? ?Other Visit Diagnoses   ? ? Varicose veins of leg with swelling, right    -  Primary  ? Relevant Medications  ? ezetimibe (ZETIA) 10 MG tablet  ? Other Relevant Orders  ? D-Dimer, Quantitative  ? US Venous Img Lower Unilateral Right (DVT)  ? Myopathy      ? Relevant Orders  ? CK (Creatine Kinase)  ? Comprehensive metabolic panel  ? Sedimentation rate  ? Swelling of joint of right knee      ? Relevant Orders  ? US Venous Img Lower Unilateral Right (DVT)  ? ?  ? ? ?I spent a total of  32  minutes with this  patient in a face to face visit on the date of this encounter reviewing the last office visit with Dr Bary Castilla following the muscle biopsy,  the most recent with Dr Sindy Guadeloupe for knee pain,  patient's  diet and eating habits, hom

## 2021-06-06 NOTE — Assessment & Plan Note (Addendum)
Untreated due to confirmed  statin myalgia.  He is willing to resume therapy with Zetia.   baseline CK is due ?

## 2021-06-06 NOTE — Assessment & Plan Note (Signed)
Exam and history suggestive of Baker's Cyst , less likely a DVt.  Ultrasound ordered  ?

## 2021-06-06 NOTE — Assessment & Plan Note (Signed)
Excellent  glycemic control with addition of Jardiance but with  worsening proteinuria     BP is at goal with ARB. Hyperlipidemia now untreated due to biopsy suggested statin myopathy suggested by elevated CK , normal ANA and history of muscle pain , . Not clear if there was an interaction with his HAART therapy.   Trial of zetia.  ?Lab Results  ?Component Value Date  ? HGBA1C 6.2 02/22/2021  ? ?Lab Results  ?Component Value Date  ? MICROALBUR 5.6 (H) 02/22/2021  ? MICROALBUR 23.4 (H) 04/29/2020  ? ? ? ?

## 2021-06-07 ENCOUNTER — Encounter: Payer: Self-pay | Admitting: General Surgery

## 2021-06-07 LAB — D-DIMER, QUANTITATIVE: D-Dimer, Quant: 0.23 mcg/mL FEU (ref <0.50)

## 2021-06-07 LAB — SURGICAL PATHOLOGY

## 2021-06-07 NOTE — Addendum Note (Signed)
Addended by: [name] on: 06/07/2021 10:33 AM ? ? Modules accepted: Orders ? ?

## 2021-06-16 ENCOUNTER — Other Ambulatory Visit: Payer: Self-pay | Admitting: Infectious Disease

## 2021-06-16 DIAGNOSIS — B20 Human immunodeficiency virus [HIV] disease: Secondary | ICD-10-CM

## 2021-06-17 ENCOUNTER — Other Ambulatory Visit: Payer: Medicare Other

## 2021-06-21 ENCOUNTER — Ambulatory Visit (INDEPENDENT_AMBULATORY_CARE_PROVIDER_SITE_OTHER): Payer: Medicare Other | Admitting: Infectious Disease

## 2021-06-21 ENCOUNTER — Encounter: Payer: Self-pay | Admitting: Infectious Disease

## 2021-06-21 ENCOUNTER — Other Ambulatory Visit: Payer: Self-pay

## 2021-06-21 VITALS — BP 153/91 | HR 83 | Temp 98.1°F | Ht 71.0 in | Wt 232.0 lb

## 2021-06-21 DIAGNOSIS — B20 Human immunodeficiency virus [HIV] disease: Secondary | ICD-10-CM

## 2021-06-21 DIAGNOSIS — G2581 Restless legs syndrome: Secondary | ICD-10-CM | POA: Diagnosis not present

## 2021-06-21 DIAGNOSIS — G729 Myopathy, unspecified: Secondary | ICD-10-CM

## 2021-06-21 DIAGNOSIS — E785 Hyperlipidemia, unspecified: Secondary | ICD-10-CM | POA: Diagnosis not present

## 2021-06-21 DIAGNOSIS — N182 Chronic kidney disease, stage 2 (mild): Secondary | ICD-10-CM | POA: Diagnosis not present

## 2021-06-21 DIAGNOSIS — R748 Abnormal levels of other serum enzymes: Secondary | ICD-10-CM

## 2021-06-21 HISTORY — DX: Myopathy, unspecified: G72.9

## 2021-06-21 MED ORDER — DARUNAVIR 800 MG PO TABS: 800.0000 mg | ORAL_TABLET | Freq: Every day | ORAL | 11 refills | Status: DC

## 2021-06-21 MED ORDER — GENVOYA 150-150-200-10 MG PO TABS: 1.0000 | ORAL_TABLET | Freq: Every day | ORAL | 11 refills | Status: DC

## 2021-06-21 NOTE — Progress Notes (Signed)
Subjective:  Chief complaint: Follow-up for HIV disease on medications   Patient ID: Adam Becker, male    DOB: 09/21/1955, 66 y.o.   MRN: [id number]  HPI  66  year old man with HIV currently well controlled on Genvoya with Prezista.  [name] had compiled prior R data as seen below  HIV Genotype Composite Data Genotype Dates:    Mutations in [place] impact drug susceptibility RT Mutations G190EQ, M41L, D67N, K70R, L74V, V75A, M184V, T215Y, K219Q  PI Mutations D30N, N88D  Integrase Mutations      Interpretation of Genotype Data per [place] HIV Database Nucleoside RTIs  abacavir (ABC) High-Level Resistance zidovudine (AZT)  High-Level Resistance stavudine (D4T)  High-Level Resistance didanosine (DDI)   High-Level Resistance emtricitabine (FTC)  High-Level Resistance lamivudine (3TC)  High-Level Resistance tenofovir (TDF)  Intermediate Resistance    Non-Nucleoside RTIs  efavirenz (EFV)  High-Level Resistance etravirine (ETR)  Intermediate Resistance nevirapine (NVP) High-Level Resistance rilpivirine (RPV) High-Level Resistance    Protease Inhibitors  atazanavir/r (ATV/r)           Potential Low-Level Resistance darunavir/r (DRV/r)           Susceptible fosamprenavir/r (FPV/r)           Susceptible indinavir/r (IDV/r) Susceptible lopinavir/r (LPV/r)            Susceptible saquinavir/r (SQV/r)           Potential Low-Level Resistance tipranavir/r (TPV/r)           Susceptible    Integrase Inhibitors  None   He was maintained nice virological suppression on Genvoya and Prezista.  In the past we considered  switch to [place] or consideration of Biktarvy + BID Rukobia (I would go to latter if trying to avoid COBI.  He has been diagnosed with a statin myopathy but as still had pain and ultimately underwent a biopsy at [facility] which came back consistent with a myopathy but not for a specific 1 was positive to could be related to herbal supplements although he  stopped them.  I would find it highly unlikely to be due to the current antiretrovirals that he is on they are not especially moderate chondral toxic.  Metformin was also mentioned in the differential.  He now is not complaining much in the way of muscle pain and he largely describes as being more of a "restless legs symptom.  I will check a CPK just to see what it looks like I have found my experience that many my patients living with HIV have slightly elevated CPK irrespective of muscle symptoms I have seen this in many of our research studies where we have been checking CPKs in asymptomatic individuals.      Past Medical History:  Diagnosis Date   1st degree AV block    seen on ekg   Asplenia 11/11/2014   Chronic kidney disease 11/2011   admission for ATN cr 11, acidosis   Diabetes mellitus without complication (HCC)    Type II   GERD (gastroesophageal reflux disease)    Gout    Heart murmur    HIV infection ([place]) 1994   Hyperglycemia 04/04/2016   Hyperlipidemia    Hypertension    Impaired fasting glucose 07/27/2015   Myopathy 06/21/2021   Neuromuscular disorder ([place])    peripheral neuropathy   Neuropathy    Umbilical hernia    Urethritis 02/11/2015    Past Surgical History:  Procedure Laterality Date   COLONOSCOPY  LIPOSUCTION HEAD / NECK  2001   posterior neck   MUSCLE BIOPSY Right 04/01/2021   Procedure: MUSCLE BIOPSY;  Surgeon: Robert Bellow, MD;  Location: [facility] ORS;  Service: General;  Laterality: Right;  gastrocnemius; O.R. to notify pathology of procedure   SPLENECTOMY  2000   reason unclear "stopped working"    Family History  Problem Relation Age of Onset   Cancer Mother    Breast cancer Mother    Colon polyps Father    Colon cancer Father    Birth defects Son        Breast    Rectal cancer Neg Hx    Stomach cancer Neg Hx       Social History   Socioeconomic History   Marital status: Single    Spouse name: Not on file   Number of  children: Not on file   Years of education: Not on file   Highest education level: Not on file  Occupational History   Not on file  Tobacco Use   Smoking status: Never   Smokeless tobacco: Never  Vaping Use   Vaping Use: Never used  Substance and Sexual Activity   Alcohol use: No   Drug use: No   Sexual activity: Not Currently    Comment: declined condoms  Other Topics Concern   Not on file  Social History Narrative   Independent baseline. Lives by himself.  Education 12th.  Children none.  Caffeine tea one cup 4 x week.   Social Determinants of Health   Financial Resource Strain: Low Risk    Difficulty of Paying Living Expenses: Not hard at all  Food Insecurity: No Food Insecurity   Worried About Charity fundraiser in the Last Year: Never true   [place] in the Last Year: Never true  Transportation Needs: No Transportation Needs   Lack of Transportation (Medical): No   Lack of Transportation (Non-Medical): No  Physical Activity: Sufficiently Active   Days of Exercise per Week: 7 days   Minutes of Exercise per Session: 30 min  Stress: No Stress Concern Present   Feeling of Stress : Not at all  Social Connections: Unknown   Frequency of Communication with Friends and Family: More than three times a week   Frequency of Social Gatherings with Friends and Family: More than three times a week   Attends Religious Services: More than 4 times per year   Active Member of [employer] or Organizations: Yes   Attends Music therapist: More than 4 times per year   Marital Status: Not on file    Allergies  Allergen Reactions   Pravastatin Other (See Comments)    rhabdomyolysis     Current Outpatient Medications:    acetaminophen (TYLENOL) 500 MG tablet, Take 1,000 mg by mouth every 6 (six) hours as needed for moderate pain or headache., Disp: , Rfl:    allopurinol (ZYLOPRIM) 100 MG tablet, TAKE 1 TABLET BY MOUTH EVERY DAY, Disp: 90 tablet, Rfl: 1   aspirin EC 81  MG tablet, Take 1 tablet (81 mg total) by mouth daily., Disp: 90 tablet, Rfl: 3   blood glucose meter kit and supplies KIT, Dispense based on patient and insurance preference. Use up to four times daily as directed., Disp: 1 each, Rfl: 11   cholecalciferol (VITAMIN D3) 25 MCG (1000 UNIT) tablet, Take 1,000 Units by mouth daily., Disp: , Rfl:    darunavir (PREZISTA) 800 MG tablet, Take 1 tablet (  800 mg total) by mouth daily., Disp: 30 tablet, Rfl: 0   elvitegravir-cobicistat-emtricitabine-tenofovir (GENVOYA) 150-150-200-10 MG TABS tablet, TAKE 1 TABLET BY MOUTH EVERY DAY WITH BREAKFAST, Disp: 30 tablet, Rfl: 0   ezetimibe (ZETIA) 10 MG tablet, Take 1 tablet (10 mg total) by mouth daily., Disp: 90 tablet, Rfl: 3   famotidine (PEPCID) 10 MG tablet, Take 10 mg by mouth 2 (two) times daily as needed for heartburn or indigestion., Disp: , Rfl:    gabapentin (NEURONTIN) 300 MG capsule, TAKE 2 CAPSULES BY MOUTH 3 TIMES DAILY., Disp: 180 capsule, Rfl: 5   JARDIANCE 10 MG TABS tablet, TAKE 1 TABLET BY MOUTH DAILY BEFORE BREAKFAST., Disp: 30 tablet, Rfl: 11   Lancets (ONETOUCH ULTRASOFT) lancets, Use as instructed to check glucose BID, Disp: 100 each, Rfl: 12   loratadine (CLARITIN) 10 MG tablet, Take 10 mg by mouth daily as needed for allergies., Disp: , Rfl:    metFORMIN (GLUCOPHAGE) 500 MG tablet, Take 1 tablet QAM and 2 tablets QPM, Disp: 270 tablet, Rfl: 3   ONETOUCH VERIO test strip, USE AS INSTRUCTED TO CHECK GLUCOSE TWICE A DAY, Disp: 100 strip, Rfl: 12   rOPINIRole (REQUIP) 1 MG tablet, Take 1.5 mg by mouth 4 (four) times daily., Disp: , Rfl:    telmisartan (MICARDIS) 40 MG tablet, TAKE 1 TABLET BY MOUTH EVERYDAY AT BEDTIME, Disp: 90 tablet, Rfl: 1   vitamin B-12 (CYANOCOBALAMIN) 1000 MCG tablet, Take 1,000 mcg by mouth daily., Disp: , Rfl:    Review of Systems  Constitutional:  Negative for activity change, appetite change, chills, diaphoresis, fatigue, fever and unexpected weight change.  HENT:   Negative for congestion, rhinorrhea, sinus pressure, sneezing, sore throat and trouble swallowing.   Eyes:  Negative for photophobia and visual disturbance.  Respiratory:  Negative for cough, chest tightness, shortness of breath, wheezing and stridor.   Cardiovascular:  Negative for chest pain, palpitations and leg swelling.  Gastrointestinal:  Negative for abdominal distention, abdominal pain, anal bleeding, blood in stool, constipation, diarrhea, nausea and vomiting.  Genitourinary:  Negative for difficulty urinating, dysuria, flank pain and hematuria.  Musculoskeletal:  Negative for arthralgias, back pain, gait problem, joint swelling and myalgias.  Skin:  Negative for color change, pallor, rash and wound.  Neurological:  Negative for dizziness, tremors, weakness and light-headedness.  Hematological:  Negative for adenopathy. Does not bruise/bleed easily.  Psychiatric/Behavioral:  Negative for agitation, behavioral problems, confusion, decreased concentration, dysphoric mood and sleep disturbance.       Objective:   Physical Exam Constitutional:      Appearance: He is well-developed.  HENT:     Head: Normocephalic and atraumatic.  Eyes:     Conjunctiva/sclera: Conjunctivae normal.  Cardiovascular:     Rate and Rhythm: Normal rate and regular rhythm.  Pulmonary:     Effort: Pulmonary effort is normal. No respiratory distress.     Breath sounds: No wheezing.  Abdominal:     General: There is no distension.     Palpations: Abdomen is soft.  Musculoskeletal:        General: No tenderness. Normal range of motion.     Cervical back: Normal range of motion and neck supple.  Skin:    General: Skin is warm and dry.     Coloration: Skin is not pale.     Findings: No erythema or rash.  Neurological:     General: No focal deficit present.     Mental Status: He is alert and oriented to person,  place, and time.  Psychiatric:        Mood and Affect: Mood normal.        Behavior: Behavior  normal.        Thought Content: Thought content normal.        Judgment: Judgment normal.          Assessment & Plan:  HIV disease:  I am checking a viral load and CD4 count CBC CMP  I will continue his Prezista with Genvoya since he likes this regimen though there are other ones we can consider  Myopathy: We will check a CPK again just to see where it is given he is currently asymptomatic.   Hypertension followed by Dr. Derrel Nip and continue Micardis  Vitals:   06/21/21 1602  BP: (!) 153/91  Pulse: 83  Temp: 98.1 F (36.7 C)  SpO2: 97%    Diabetes mellitus he is on Jardiance and metformin.  Note the latter was mentioned in the report at [facility] as being a potential offender for causing myopathy

## 2021-06-22 ENCOUNTER — Telehealth (INDEPENDENT_AMBULATORY_CARE_PROVIDER_SITE_OTHER): Payer: Medicare Other | Admitting: Family Medicine

## 2021-06-22 ENCOUNTER — Encounter: Payer: Self-pay | Admitting: Family Medicine

## 2021-06-22 DIAGNOSIS — J301 Allergic rhinitis due to pollen: Secondary | ICD-10-CM | POA: Diagnosis not present

## 2021-06-22 MED ORDER — TRIAMCINOLONE ACETONIDE 55 MCG/ACT NA AERO: 2.0000 | INHALATION_SPRAY | Freq: Every day | NASAL | 3 refills | Status: DC

## 2021-06-22 NOTE — Progress Notes (Signed)
Virtual Visit via video Note  This visit type was conducted due to national recommendations for restrictions regarding the COVID-19 pandemic (e.g. social distancing).  This format is felt to be most appropriate for this patient at this time.  All issues noted in this document were discussed and addressed.  No physical exam was performed (except for noted visual exam findings with Video Visits).   I connected with Adam Becker today at 10:00 AM EDT by a video enabled telemedicine application or telephone and verified that I am speaking with the correct person using two identifiers. Location patient: home Location provider: home office Persons participating in the virtual visit: patient, provider  I discussed the limitations, risks, security and privacy concerns of performing an evaluation and management service by telephone and the availability of in person appointments. I also discussed with the patient that there may be a patient responsible charge related to this service. The patient expressed understanding and agreed to proceed.   Reason for visit: Same-day visit.  HPI: Sore throat: Patient notes this started about 10 days ago with a scratchy throat.  He gets a little worse at night.  He has had mild congestion in his sinuses.  He notes mild cough productive of minimal phlegm.  He notes no fevers, shortness of breath, ear issues, or loss of taste or smell.  He is unsure if he has postnasal drip.  He notes no white material in his throat.  He notes no known COVID or strep exposures.  He does report taking several negative COVID tests.  He has been gargling with salt water and Listerine.  He is also been using throat lozenges and taking Claritin.  Symptoms have not significantly improved.   ROS: See pertinent positives and negatives per HPI.  Past Medical History:  Diagnosis Date   1st degree AV block    seen on ekg   Asplenia 11/11/2014   Chronic kidney disease 11/2011   admission  for ATN cr 11, acidosis   Diabetes mellitus without complication (HCC)    Type II   GERD (gastroesophageal reflux disease)    Gout    Heart murmur    HIV infection ([place]) 1994   Hyperglycemia 04/04/2016   Hyperlipidemia    Hypertension    Impaired fasting glucose 07/27/2015   Myopathy 06/21/2021   Neuromuscular disorder ([place])    peripheral neuropathy   Neuropathy    Umbilical hernia    Urethritis 02/11/2015    Past Surgical History:  Procedure Laterality Date   COLONOSCOPY     LIPOSUCTION HEAD / NECK  2001   posterior neck   MUSCLE BIOPSY Right 04/01/2021   Procedure: MUSCLE BIOPSY;  Surgeon: Robert Bellow, MD;  Location: [facility] ORS;  Service: General;  Laterality: Right;  gastrocnemius; O.R. to notify pathology of procedure   SPLENECTOMY  2000   reason unclear "stopped working"    Family History  Problem Relation Age of Onset   Cancer Mother    Breast cancer Mother    Colon polyps Father    Colon cancer Father    Birth defects Son        Breast    Rectal cancer Neg Hx    Stomach cancer Neg Hx     SOCIAL HX: Non-smoker   Current Outpatient Medications:    acetaminophen (TYLENOL) 500 MG tablet, Take 1,000 mg by mouth every 6 (six) hours as needed for moderate pain or headache., Disp: , Rfl:    allopurinol (ZYLOPRIM) 100  MG tablet, TAKE 1 TABLET BY MOUTH EVERY DAY, Disp: 90 tablet, Rfl: 1   aspirin EC 81 MG tablet, Take 1 tablet (81 mg total) by mouth daily., Disp: 90 tablet, Rfl: 3   blood glucose meter kit and supplies KIT, Dispense based on patient and insurance preference. Use up to four times daily as directed., Disp: 1 each, Rfl: 11   cholecalciferol (VITAMIN D3) 25 MCG (1000 UNIT) tablet, Take 1,000 Units by mouth daily., Disp: , Rfl:    darunavir (PREZISTA) 800 MG tablet, Take 1 tablet (800 mg total) by mouth daily., Disp: 30 tablet, Rfl: 11   elvitegravir-cobicistat-emtricitabine-tenofovir (GENVOYA) 150-150-200-10 MG TABS tablet, Take 1 tablet by mouth daily  with breakfast., Disp: 30 tablet, Rfl: 11   ezetimibe (ZETIA) 10 MG tablet, Take 1 tablet (10 mg total) by mouth daily., Disp: 90 tablet, Rfl: 3   famotidine (PEPCID) 10 MG tablet, Take 10 mg by mouth 2 (two) times daily as needed for heartburn or indigestion., Disp: , Rfl:    gabapentin (NEURONTIN) 300 MG capsule, TAKE 2 CAPSULES BY MOUTH 3 TIMES DAILY., Disp: 180 capsule, Rfl: 5   JARDIANCE 10 MG TABS tablet, TAKE 1 TABLET BY MOUTH DAILY BEFORE BREAKFAST., Disp: 30 tablet, Rfl: 11   Lancets (ONETOUCH ULTRASOFT) lancets, Use as instructed to check glucose BID, Disp: 100 each, Rfl: 12   loratadine (CLARITIN) 10 MG tablet, Take 10 mg by mouth daily as needed for allergies., Disp: , Rfl:    metFORMIN (GLUCOPHAGE) 500 MG tablet, Take 1 tablet QAM and 2 tablets QPM, Disp: 270 tablet, Rfl: 3   ONETOUCH VERIO test strip, USE AS INSTRUCTED TO CHECK GLUCOSE TWICE A DAY, Disp: 100 strip, Rfl: 12   rOPINIRole (REQUIP) 1 MG tablet, Take 1.5 mg by mouth 4 (four) times daily., Disp: , Rfl:    telmisartan (MICARDIS) 40 MG tablet, TAKE 1 TABLET BY MOUTH EVERYDAY AT BEDTIME, Disp: 90 tablet, Rfl: 1   triamcinolone (NASACORT) 55 MCG/ACT AERO nasal inhaler, Place 2 sprays into the nose daily., Disp: 1 each, Rfl: 3   vitamin B-12 (CYANOCOBALAMIN) 1000 MCG tablet, Take 1,000 mcg by mouth daily., Disp: , Rfl:   EXAM:  VITALS per patient if applicable:  GENERAL: alert, oriented, appears well and in no acute distress  HEENT: atraumatic, conjunttiva clear, no obvious abnormalities on inspection of external nose and ears  NECK: normal movements of the head and neck  LUNGS: on inspection no signs of respiratory distress, breathing rate appears normal, no obvious gross SOB, gasping or wheezing  CV: no obvious cyanosis  MS: moves all visible extremities without noticeable abnormality  PSYCH/NEURO: pleasant and cooperative, no obvious depression or anxiety, speech and thought processing grossly intact  ASSESSMENT  AND PLAN:  Discussed the following assessment and plan:  Problem List Items Addressed This Visit     Allergic rhinitis due to allergen    I suspect the patient's symptoms are related to allergic rhinitis.  Discussed that his congestion is not significant enough to indicate a bacterial sinusitis.  I suspect a viral illness would have improved by this point.  We will start him on Nasacort.  He will continue Claritin.  If he has any worsening symptoms he will contact us right away.  If his symptoms are not improving he will contact us as well.       Relevant Medications   triamcinolone (NASACORT) 55 MCG/ACT AERO nasal inhaler    Return if symptoms worsen or fail to improve.  I discussed the assessment and treatment plan with the patient. The patient was provided an opportunity to ask questions and all were answered. The patient agreed with the plan and demonstrated an understanding of the instructions.   The patient was advised to call back or seek an in-person evaluation if the symptoms worsen or if the condition fails to improve as anticipated.  Tommi Rumps, MD

## 2021-06-22 NOTE — Assessment & Plan Note (Signed)
I suspect the patient's symptoms are related to allergic rhinitis.  Discussed that his congestion is not significant enough to indicate a bacterial sinusitis.  I suspect a viral illness would have improved by this point.  We will start him on Nasacort.  He will continue Claritin.  If he has any worsening symptoms he will contact us right away.  If his symptoms are not improving he will contact us as well.

## 2021-06-23 LAB — T-HELPER CELLS (CD4) COUNT (NOT AT ARMC)
CD4 % Helper T Cell: 40 % (ref 33–65)
CD4 T Cell Abs: 1242 /uL (ref 400–1790)

## 2021-06-24 LAB — CBC WITH DIFFERENTIAL/PLATELET
Absolute Monocytes: 1028 cells/uL — ABNORMAL HIGH (ref 200–950)
Basophils Absolute: 68 cells/uL (ref 0–200)
Basophils Relative: 0.9 %
Eosinophils Absolute: 488 cells/uL (ref 15–500)
Eosinophils Relative: 6.5 %
HCT: 42 % (ref 38.5–50.0)
Hemoglobin: 14.7 g/dL (ref 13.2–17.1)
Lymphs Abs: 3420 cells/uL (ref 850–3900)
MCH: 33.3 pg — ABNORMAL HIGH (ref 27.0–33.0)
MCHC: 35 g/dL (ref 32.0–36.0)
MCV: 95 fL (ref 80.0–100.0)
MPV: 9.3 fL (ref 7.5–12.5)
Monocytes Relative: 13.7 %
Neutro Abs: 2498 cells/uL (ref 1500–7800)
Neutrophils Relative %: 33.3 %
Platelets: 368 10*3/uL (ref 140–400)
RBC: 4.42 10*6/uL (ref 4.20–5.80)
RDW: 13.7 % (ref 11.0–15.0)
Total Lymphocyte: 45.6 %
WBC: 7.5 10*3/uL (ref 3.8–10.8)

## 2021-06-24 LAB — COMPLETE METABOLIC PANEL WITH GFR
AG Ratio: 1.3 (calc) (ref 1.0–2.5)
ALT: 30 U/L (ref 9–46)
AST: 29 U/L (ref 10–35)
Albumin: 4.3 g/dL (ref 3.6–5.1)
Alkaline phosphatase (APISO): 55 U/L (ref 35–144)
BUN: 13 mg/dL (ref 7–25)
CO2: 25 mmol/L (ref 20–32)
Calcium: 9.2 mg/dL (ref 8.6–10.3)
Chloride: 105 mmol/L (ref 98–110)
Creat: 1.12 mg/dL (ref 0.70–1.35)
Globulin: 3.3 g/dL (calc) (ref 1.9–3.7)
Glucose, Bld: 151 mg/dL — ABNORMAL HIGH (ref 65–99)
Potassium: 4 mmol/L (ref 3.5–5.3)
Sodium: 140 mmol/L (ref 135–146)
Total Bilirubin: 0.2 mg/dL (ref 0.2–1.2)
Total Protein: 7.6 g/dL (ref 6.1–8.1)
eGFR: 73 mL/min/{1.73_m2} (ref >=60)

## 2021-06-24 LAB — HIV-1 RNA QUANT-NO REFLEX-BLD
HIV 1 RNA Quant: NOT DETECTED copies/mL
HIV-1 RNA Quant, Log: NOT DETECTED Log copies/mL

## 2021-06-24 LAB — LIPID PANEL
Cholesterol: 167 mg/dL (ref <200)
HDL: 34 mg/dL — ABNORMAL LOW (ref >=40)
LDL Cholesterol (Calc): 90 mg/dL (calc)
Non-HDL Cholesterol (Calc): 133 mg/dL (calc) — ABNORMAL HIGH (ref <130)
Total CHOL/HDL Ratio: 4.9 (calc) (ref <5.0)
Triglycerides: 326 mg/dL — ABNORMAL HIGH (ref <150)

## 2021-06-24 LAB — RPR: RPR Ser Ql: NONREACTIVE

## 2021-06-24 LAB — CK: Total CK: 654 U/L — ABNORMAL HIGH (ref 44–196)

## 2021-06-27 ENCOUNTER — Other Ambulatory Visit: Payer: Self-pay | Admitting: Internal Medicine

## 2021-06-28 ENCOUNTER — Other Ambulatory Visit (INDEPENDENT_AMBULATORY_CARE_PROVIDER_SITE_OTHER): Payer: Medicare Other

## 2021-06-28 DIAGNOSIS — E785 Hyperlipidemia, unspecified: Secondary | ICD-10-CM

## 2021-06-28 DIAGNOSIS — E7800 Pure hypercholesterolemia, unspecified: Secondary | ICD-10-CM

## 2021-06-28 DIAGNOSIS — B20 Human immunodeficiency virus [HIV] disease: Secondary | ICD-10-CM

## 2021-06-28 LAB — COMPREHENSIVE METABOLIC PANEL
ALT: 32 U/L (ref 0–53)
AST: 29 U/L (ref 0–37)
Albumin: 4.3 g/dL (ref 3.5–5.2)
Alkaline Phosphatase: 54 U/L (ref 39–117)
BUN: 11 mg/dL (ref 6–23)
CO2: 28 mEq/L (ref 19–32)
Calcium: 9.8 mg/dL (ref 8.4–10.5)
Chloride: 102 mEq/L (ref 96–112)
Creatinine, Ser: 1.06 mg/dL (ref 0.40–1.50)
GFR: 74.05 mL/min (ref >60.00)
Glucose, Bld: 76 mg/dL (ref 70–99)
Potassium: 4 mEq/L (ref 3.5–5.1)
Sodium: 138 mEq/L (ref 135–145)
Total Bilirubin: 0.3 mg/dL (ref 0.2–1.2)
Total Protein: 8.4 g/dL — ABNORMAL HIGH (ref 6.0–8.3)

## 2021-07-04 ENCOUNTER — Telehealth: Payer: Self-pay | Admitting: Internal Medicine

## 2021-07-04 ENCOUNTER — Telehealth: Payer: Self-pay

## 2021-07-04 NOTE — Telephone Encounter (Signed)
Pt called in stating that he saw Dr. Caryl Bis two weeks ago for coughing/mucus/scratch throat.... Pt stated that she is not getting any better... pt stated that he was prescribe medication for nasal saline... Pt stated that his scratchy throat comes and go... Pt is requesting callback... Pt stated that he haven't heard anything about his ultrasound on right knee... Pt requesting callback

## 2021-07-04 NOTE — Telephone Encounter (Signed)
Spoke with pt and he stated that he has not gotten a call about scheduling the US venous img lower unilateral.

## 2021-07-04 NOTE — Telephone Encounter (Signed)
Spoke with pt and scheduled him for an appt with Dr. Derrel Nip tomorrow at 11 am.

## 2021-07-05 ENCOUNTER — Ambulatory Visit (INDEPENDENT_AMBULATORY_CARE_PROVIDER_SITE_OTHER): Payer: Medicare Other | Admitting: Internal Medicine

## 2021-07-05 ENCOUNTER — Encounter: Payer: Self-pay | Admitting: Internal Medicine

## 2021-07-05 ENCOUNTER — Ambulatory Visit (INDEPENDENT_AMBULATORY_CARE_PROVIDER_SITE_OTHER): Payer: Medicare Other

## 2021-07-05 VITALS — BP 130/80 | HR 82 | Temp 98.0°F | Ht 71.0 in | Wt 229.0 lb

## 2021-07-05 DIAGNOSIS — R059 Cough, unspecified: Secondary | ICD-10-CM

## 2021-07-05 DIAGNOSIS — E669 Obesity, unspecified: Secondary | ICD-10-CM

## 2021-07-05 DIAGNOSIS — E1169 Type 2 diabetes mellitus with other specified complication: Secondary | ICD-10-CM | POA: Diagnosis not present

## 2021-07-05 DIAGNOSIS — E1159 Type 2 diabetes mellitus with other circulatory complications: Secondary | ICD-10-CM

## 2021-07-05 DIAGNOSIS — I152 Hypertension secondary to endocrine disorders: Secondary | ICD-10-CM | POA: Diagnosis not present

## 2021-07-05 DIAGNOSIS — J189 Pneumonia, unspecified organism: Secondary | ICD-10-CM | POA: Diagnosis not present

## 2021-07-05 DIAGNOSIS — E7800 Pure hypercholesterolemia, unspecified: Secondary | ICD-10-CM | POA: Diagnosis not present

## 2021-07-05 LAB — COMPREHENSIVE METABOLIC PANEL
ALT: 31 U/L (ref 0–53)
AST: 25 U/L (ref 0–37)
Albumin: 4.3 g/dL (ref 3.5–5.2)
Alkaline Phosphatase: 59 U/L (ref 39–117)
BUN: 13 mg/dL (ref 6–23)
CO2: 28 mEq/L (ref 19–32)
Calcium: 9.7 mg/dL (ref 8.4–10.5)
Chloride: 103 mEq/L (ref 96–112)
Creatinine, Ser: 1.09 mg/dL (ref 0.40–1.50)
GFR: 71.6 mL/min (ref >60.00)
Glucose, Bld: 90 mg/dL (ref 70–99)
Potassium: 4.1 mEq/L (ref 3.5–5.1)
Sodium: 139 mEq/L (ref 135–145)
Total Bilirubin: 0.4 mg/dL (ref 0.2–1.2)
Total Protein: 7.9 g/dL (ref 6.0–8.3)

## 2021-07-05 LAB — LIPID PANEL
Cholesterol: 183 mg/dL (ref 0–200)
HDL: 43.6 mg/dL (ref >39.00)
NonHDL: 139.68
Total CHOL/HDL Ratio: 4
Triglycerides: 294 mg/dL — ABNORMAL HIGH (ref 0.0–149.0)
VLDL: 58.8 mg/dL — ABNORMAL HIGH (ref 0.0–40.0)

## 2021-07-05 LAB — HEMOGLOBIN A1C: Hgb A1c MFr Bld: 6.2 % (ref 4.6–6.5)

## 2021-07-05 LAB — LDL CHOLESTEROL, DIRECT: Direct LDL: 103 mg/dL

## 2021-07-05 NOTE — Patient Instructions (Signed)
I think your cough is due to allergic rhinitis  Increase your claritin to every 12 hours and resume the steroid nasal spray every 12 hours  I ordered a Chest x ray to rule out bronchitis/pneumonia.  If there is any sign of infection I will call you in an antibiotic   For the early wakup,  try using 5 mg melatonin at night

## 2021-07-05 NOTE — Assessment & Plan Note (Signed)
Symptoms are  likely due to allergic rhiniis,  But checking CXR given chronicity and HIV status  Advised resume nasal steroid spray bd and increase claritin use to bid.

## 2021-07-05 NOTE — Progress Notes (Signed)
Subjective:  Patient ID: Adam Becker, male    DOB: [birth year]  Age: 66 y.o. MRN: [id number]  CC: The primary encounter diagnosis was Cough in adult. Diagnoses of Obesity, diabetes, and hypertension syndrome ([place]), Pure hypercholesterolemia, and Pneumonia of left lower lobe due to infectious organism were also pertinent to this visit.   HPI KEON PENDER presents for  Chief Complaint  Patient presents with   Cough    Scratchy throat and cough x4weeks   Treated during a virtual visit for sore throat , cough and sneezing with a steroid nasal spray  on may  31.    Symptoms worse late at night  and early morning,  productive cough .  States that the spray helped  the soreness and the drainage when he used it twice daily for  10 days then stopped it.  Still usig an "allergy pills" not sure which one.,     Outpatient Medications Prior to Visit  Medication Sig Dispense Refill   acetaminophen (TYLENOL) 500 MG tablet Take 1,000 mg by mouth every 6 (six) hours as needed for moderate pain or headache.     allopurinol (ZYLOPRIM) 100 MG tablet TAKE 1 TABLET BY MOUTH EVERY DAY 90 tablet 3   aspirin EC 81 MG tablet Take 1 tablet (81 mg total) by mouth daily. 90 tablet 3   blood glucose meter kit and supplies KIT Dispense based on patient and insurance preference. Use up to four times daily as directed. 1 each 11   cholecalciferol (VITAMIN D3) 25 MCG (1000 UNIT) tablet Take 1,000 Units by mouth daily.     darunavir (PREZISTA) 800 MG tablet Take 1 tablet (800 mg total) by mouth daily. 30 tablet 11   elvitegravir-cobicistat-emtricitabine-tenofovir (GENVOYA) 150-150-200-10 MG TABS tablet Take 1 tablet by mouth daily with breakfast. 30 tablet 11   ezetimibe (ZETIA) 10 MG tablet Take 1 tablet (10 mg total) by mouth daily. 90 tablet 3   famotidine (PEPCID) 10 MG tablet Take 10 mg by mouth 2 (two) times daily as needed for heartburn or indigestion.     gabapentin (NEURONTIN) 300 MG capsule TAKE 2  CAPSULES BY MOUTH 3 TIMES DAILY. 180 capsule 5   JARDIANCE 10 MG TABS tablet TAKE 1 TABLET BY MOUTH DAILY BEFORE BREAKFAST. 30 tablet 11   Lancets (ONETOUCH ULTRASOFT) lancets Use as instructed to check glucose BID 100 each 12   loratadine (CLARITIN) 10 MG tablet Take 10 mg by mouth daily as needed for allergies.     metFORMIN (GLUCOPHAGE) 500 MG tablet Take 1 tablet QAM and 2 tablets QPM 270 tablet 3   ONETOUCH VERIO test strip USE AS INSTRUCTED TO CHECK GLUCOSE TWICE A DAY 100 strip 12   rOPINIRole (REQUIP) 1 MG tablet Take 1.5 mg by mouth 4 (four) times daily.     telmisartan (MICARDIS) 40 MG tablet TAKE 1 TABLET BY MOUTH EVERYDAY AT BEDTIME 90 tablet 1   triamcinolone (NASACORT) 55 MCG/ACT AERO nasal inhaler Place 2 sprays into the nose daily. 1 each 3   vitamin B-12 (CYANOCOBALAMIN) 1000 MCG tablet Take 1,000 mcg by mouth daily.     No facility-administered medications prior to visit.    Review of Systems;  Patient denies headache, fevers, malaise, unintentional weight loss, skin rash, eye pain, sinus congestion and sinus pain, sore throat, dysphagia,  hemoptysis , cough, dyspnea, wheezing, chest pain, palpitations, orthopnea, edema, abdominal pain, nausea, melena, diarrhea, constipation, flank pain, dysuria, hematuria, urinary  Frequency, nocturia, numbness, tingling, seizures,  Focal weakness, Loss of consciousness,  Tremor, insomnia, depression, anxiety, and suicidal ideation.      Objective:  BP 130/80 (BP Location: Left Arm, Patient Position: Sitting, Cuff Size: Normal)   Pulse 82   Temp 98 F (36.7 C) (Oral)   Ht 5' 11" (1.803 m)   Wt 229 lb (103.9 kg)   SpO2 97%   BMI 31.94 kg/m   BP Readings from Last 3 Encounters:  07/05/21 130/80  06/21/21 (!) 153/91  06/06/21 140/88    Wt Readings from Last 3 Encounters:  07/05/21 229 lb (103.9 kg)  06/22/21 232 lb (105.2 kg)  06/21/21 232 lb (105.2 kg)    General appearance: alert, cooperative and appears stated age Ears:  normal TM's and external ear canals both ears Throat: lips, mucosa, and tongue normal; teeth and gums normal Neck: no adenopathy, no carotid bruit, supple, symmetrical, trachea midline and thyroid not enlarged, symmetric, no tenderness/mass/nodules Back: symmetric, no curvature. ROM normal. No CVA tenderness. Lungs: clear to auscultation bilaterally Heart: regular rate and rhythm, S1, S2 normal, no murmur, click, rub or gallop Abdomen: soft, non-tender; bowel sounds normal; no masses,  no organomegaly Pulses: 2+ and symmetric Skin: Skin color, texture, turgor normal. No rashes or lesions Lymph nodes: Cervical, supraclavicular, and axillary nodes normal.  Lab Results  Component Value Date   HGBA1C 6.2 07/05/2021   HGBA1C 6.2 02/22/2021   HGBA1C 6.0 11/18/2020    Lab Results  Component Value Date   CREATININE 1.09 07/05/2021   CREATININE 1.06 06/28/2021   CREATININE 1.12 06/21/2021    Lab Results  Component Value Date   WBC 7.5 06/21/2021   HGB 14.7 06/21/2021   HCT 42.0 06/21/2021   PLT 368 06/21/2021   GLUCOSE 90 07/05/2021   CHOL 183 07/05/2021   TRIG 294.0 (H) 07/05/2021   HDL 43.60 07/05/2021   LDLDIRECT 103.0 07/05/2021   LDLCALC 90 06/21/2021   ALT 31 07/05/2021   AST 25 07/05/2021   NA 139 07/05/2021   K 4.1 07/05/2021   CL 103 07/05/2021   CREATININE 1.09 07/05/2021   BUN 13 07/05/2021   CO2 28 07/05/2021   TSH 1.06 04/29/2020   PSA 0.45 04/29/2020   HGBA1C 6.2 07/05/2021   MICROALBUR 5.6 (H) 02/22/2021    No results found.  Assessment & Plan:   Problem List Items Addressed This Visit     Obesity, diabetes, and hypertension syndrome ([place])   Relevant Orders   Comp Met (CMET) (Completed)   HgB A1c (Completed)   LLL pneumonia   Relevant Medications   azithromycin (ZITHROMAX) 500 MG tablet   doxycycline (VIBRA-TABS) 100 MG tablet   Other Relevant Orders   DG Chest 2 View   Hyperlipidemia   Relevant Orders   Lipid Profile (Completed)   Direct  LDL (Completed)   Cough in adult - Primary    Symptoms are  likely due to allergic rhiniis,  But checking CXR given chronicity and HIV status  Advised resume nasal steroid spray bd and increase claritin use to bid.       Relevant Orders   DG Chest 2 View (Completed)    I spent a total of 28  minutes with this patient in a face to face visit on the date of this encounter reviewing the last office visit with me  ,  his  most recent with patient's infectious disease specialist ,  his recent  virtual visit with Dr Caryl Bis, home blood pressure readings ,  most recent  imaging study ,   and post visit ordering of testing and therapeutics.    Follow-up: No follow-ups on file.   [name], MD

## 2021-07-06 DIAGNOSIS — J189 Pneumonia, unspecified organism: Secondary | ICD-10-CM | POA: Insufficient documentation

## 2021-07-06 HISTORY — DX: Pneumonia, unspecified organism: J18.9

## 2021-07-06 MED ORDER — DOXYCYCLINE HYCLATE 100 MG PO TABS: 100.0000 mg | ORAL_TABLET | Freq: Two times a day (BID) | ORAL | 0 refills | Status: DC

## 2021-07-06 MED ORDER — AZITHROMYCIN 500 MG PO TABS: 500.0000 mg | ORAL_TABLET | Freq: Every day | ORAL | 0 refills | Status: DC

## 2021-07-06 NOTE — Addendum Note (Signed)
Addended by: [name] on: 07/06/2021 12:19 PM   Modules accepted: Orders, Level of Service

## 2021-07-18 ENCOUNTER — Ambulatory Visit
Admission: RE | Admit: 2021-07-18 | Discharge: 2021-07-18 | Disposition: A | Discharge status: Home / Self Care | Payer: Medicare Other | Source: Ambulatory Visit | Attending: Internal Medicine | Admitting: Internal Medicine

## 2021-07-18 DIAGNOSIS — M25461 Effusion, right knee: Secondary | ICD-10-CM

## 2021-07-18 DIAGNOSIS — I83891 Varicose veins of right lower extremities with other complications: Secondary | ICD-10-CM | POA: Diagnosis not present

## 2021-07-18 DIAGNOSIS — R200 Anesthesia of skin: Secondary | ICD-10-CM | POA: Diagnosis not present

## 2021-07-18 DIAGNOSIS — M7989 Other specified soft tissue disorders: Secondary | ICD-10-CM | POA: Diagnosis not present

## 2021-07-25 NOTE — Telephone Encounter (Signed)
No appts available this week.

## 2021-07-25 NOTE — Telephone Encounter (Signed)
Pt called in stating he is still coughing up mucus and hears a little wheezing. Pt want recommendations for medication

## 2021-07-25 NOTE — Telephone Encounter (Signed)
Pt is aware of the [place] results he is wanting to know what medications he can take for the productive cough and little wheezing he is still having.

## 2021-07-27 ENCOUNTER — Other Ambulatory Visit: Payer: Self-pay | Admitting: Internal Medicine

## 2021-07-27 MED ORDER — PREDNISONE 10 MG PO TABS: ORAL_TABLET | ORAL | 0 refills | Status: DC

## 2021-07-27 MED ORDER — ALBUTEROL SULFATE HFA 108 (90 BASE) MCG/ACT IN AERS: 2.0000 | INHALATION_SPRAY | Freq: Four times a day (QID) | RESPIRATORY_TRACT | 2 refills | Status: DC | PRN

## 2021-07-27 NOTE — Telephone Encounter (Signed)
I spoke with patient & he stated that completed both antibiotics.

## 2021-07-27 NOTE — Telephone Encounter (Signed)
Pt called and is scheduled Monday 7/17 to follow-up.

## 2021-07-30 ENCOUNTER — Other Ambulatory Visit: Payer: Self-pay | Admitting: Internal Medicine

## 2021-07-30 DIAGNOSIS — E1129 Type 2 diabetes mellitus with other diabetic kidney complication: Secondary | ICD-10-CM

## 2021-08-08 ENCOUNTER — Encounter: Payer: Self-pay | Admitting: Internal Medicine

## 2021-08-08 ENCOUNTER — Ambulatory Visit (INDEPENDENT_AMBULATORY_CARE_PROVIDER_SITE_OTHER): Payer: Medicare Other | Admitting: Internal Medicine

## 2021-08-08 VITALS — BP 112/72 | HR 89 | Temp 97.8°F | Ht 71.0 in | Wt 230.2 lb

## 2021-08-08 DIAGNOSIS — I358 Other nonrheumatic aortic valve disorders: Secondary | ICD-10-CM

## 2021-08-08 DIAGNOSIS — J189 Pneumonia, unspecified organism: Secondary | ICD-10-CM | POA: Diagnosis not present

## 2021-08-08 DIAGNOSIS — F5104 Psychophysiologic insomnia: Secondary | ICD-10-CM | POA: Diagnosis not present

## 2021-08-08 DIAGNOSIS — G720 Drug-induced myopathy: Secondary | ICD-10-CM | POA: Diagnosis not present

## 2021-08-08 DIAGNOSIS — R011 Cardiac murmur, unspecified: Secondary | ICD-10-CM | POA: Diagnosis not present

## 2021-08-08 DIAGNOSIS — T466X5A Adverse effect of antihyperlipidemic and antiarteriosclerotic drugs, initial encounter: Secondary | ICD-10-CM | POA: Diagnosis not present

## 2021-08-08 MED ORDER — ALPRAZOLAM 0.25 MG PO TABS: 0.2500 mg | ORAL_TABLET | Freq: Every evening | ORAL | 0 refills | Status: DC | PRN

## 2021-08-08 NOTE — Patient Instructions (Addendum)
Come to office on or around August 15th to have a repeat chest x ray  to make sure it has cleared the left lower lobe infiltrate   You have a more prominent sounding murmur that needs evaluation.  I have ordered an ECHO (ultrasound of heart)  to determine which valve is involved.  Your last ECHO in 2019 noted mild aortic regurgitation   Your labs were excellent in June.  We'll repeat them in December.

## 2021-08-08 NOTE — Progress Notes (Signed)
Subjective:  Patient ID: Adam Becker, male    DOB: 1955/04/30  Age: 66 y.o. MRN: [id number]  CC: The primary encounter diagnosis was Pneumonia of left lower lobe due to infectious organism. Diagnoses of Systolic murmur of aorta, Statin myopathy, Psychophysiological insomnia, and Systolic murmur were also pertinent to this visit.   HPI Adam Becker presents for follow up on LLL pneumonia diagnosed in June by chest x ray .  He feels much better having completed a round of dual antibiotics a, and a prednisone taper.  Chief Complaint  Patient presents with   Follow-up    Follow up after steroid taper   2) his right lower leg posterior discomfort has been evaluated by ultrasound and no Baker's cyst was found.  His symptoms are limited to  intermittent numbness in calf, without cramping.  Ultrasound was negative   3) DM :  he  feels generally well,  But is not  exercising regularly due to work . Checking  blood sugars less than once daily at variable times, usually only if he feels she may be having a hypoglycemic event. .  BS have been under 130 fasting and < 150 post prandially.  Denies any recent hypoglyemic events.  Taking   Jardiance and Metformin  as directed. Following a carbohydrate modified diet 6 days per week. Denies numbness, burning and tingling of extremities. Appetite is good.    4) Insomnia:  early morning waking , unable to return to sleep due to compulsive thoughts.   Outpatient Medications Prior to Visit  Medication Sig Dispense Refill   acetaminophen (TYLENOL) 500 MG tablet Take 1,000 mg by mouth every 6 (six) hours as needed for moderate pain or headache.     albuterol (VENTOLIN HFA) 108 (90 Base) MCG/ACT inhaler Inhale 2 puffs into the lungs every 6 (six) hours as needed for wheezing or shortness of breath. 8 g 2   allopurinol (ZYLOPRIM) 100 MG tablet TAKE 1 TABLET BY MOUTH EVERY DAY 90 tablet 3   aspirin EC 81 MG tablet Take 1 tablet (81 mg total) by mouth daily.  90 tablet 3   blood glucose meter kit and supplies KIT Dispense based on patient and insurance preference. Use up to four times daily as directed. 1 each 11   cholecalciferol (VITAMIN D3) 25 MCG (1000 UNIT) tablet Take 1,000 Units by mouth daily.     darunavir (PREZISTA) 800 MG tablet Take 1 tablet (800 mg total) by mouth daily. 30 tablet 11   elvitegravir-cobicistat-emtricitabine-tenofovir (GENVOYA) 150-150-200-10 MG TABS tablet Take 1 tablet by mouth daily with breakfast. 30 tablet 11   ezetimibe (ZETIA) 10 MG tablet Take 1 tablet (10 mg total) by mouth daily. 90 tablet 3   famotidine (PEPCID) 10 MG tablet Take 10 mg by mouth 2 (two) times daily as needed for heartburn or indigestion.     gabapentin (NEURONTIN) 300 MG capsule TAKE 2 CAPSULES BY MOUTH 3 TIMES DAILY. 180 capsule 5   JARDIANCE 10 MG TABS tablet TAKE 1 TABLET BY MOUTH DAILY BEFORE BREAKFAST. 30 tablet 11   Lancets (ONETOUCH ULTRASOFT) lancets Use as instructed to check glucose BID 100 each 12   loratadine (CLARITIN) 10 MG tablet Take 10 mg by mouth daily as needed for allergies.     metFORMIN (GLUCOPHAGE) 500 MG tablet TAKE 1 TABLET BY MOUTH IN THE MORNING AND 2 TABLETS BY MOUTH IN THE EVENING 270 tablet 3   ONETOUCH VERIO test strip USE AS INSTRUCTED TO CHECK GLUCOSE  TWICE A DAY 100 strip 12   rOPINIRole (REQUIP) 1 MG tablet Take 1.5 mg by mouth 4 (four) times daily.     telmisartan (MICARDIS) 40 MG tablet TAKE 1 TABLET BY MOUTH EVERYDAY AT BEDTIME 90 tablet 1   triamcinolone (NASACORT) 55 MCG/ACT AERO nasal inhaler Place 2 sprays into the nose daily. 1 each 3   vitamin B-12 (CYANOCOBALAMIN) 1000 MCG tablet Take 1,000 mcg by mouth daily.     azithromycin (ZITHROMAX) 500 MG tablet Take 1 tablet (500 mg total) by mouth daily. 7 tablet 0   doxycycline (VIBRA-TABS) 100 MG tablet Take 1 tablet (100 mg total) by mouth 2 (two) times daily. 14 tablet 0   predniSONE (DELTASONE) 10 MG tablet 6 tablets on Day 1 , then reduce by 1 tablet daily  until gone (Patient not taking: Reported on 08/08/2021) 21 tablet 0   No facility-administered medications prior to visit.    Review of Systems;  Patient denies headache, fevers, malaise, unintentional weight loss, skin rash, eye pain, sinus congestion and sinus pain, sore throat, dysphagia,  hemoptysis , cough, dyspnea, wheezing, chest pain, palpitations, orthopnea, edema, abdominal pain, nausea, melena, diarrhea, constipation, flank pain, dysuria, hematuria, urinary  Frequency, nocturia, tingling, seizures,  Focal weakness, Loss of consciousness,  Tremor,, depression, anxiety, and suicidal ideation.      Objective:  BP 112/72 (BP Location: Left Arm, Patient Position: Sitting, Cuff Size: Large)   Pulse 89   Temp 97.8 F (36.6 C) (Oral)   Ht 5\' 11"  (1.803 m)   Wt 230 lb 3.2 oz (104.4 kg)   SpO2 94%   BMI 32.11 kg/m   BP Readings from Last 3 Encounters:  08/08/21 112/72  07/05/21 130/80  06/21/21 (!) 153/91    Wt Readings from Last 3 Encounters:  08/08/21 230 lb 3.2 oz (104.4 kg)  07/05/21 229 lb (103.9 kg)  06/22/21 232 lb (105.2 kg)    General appearance: alert, cooperative and appears stated age Ears: normal TM's and external ear canals both ears Throat: lips, mucosa, and tongue normal; teeth and gums normal Neck: no adenopathy, no carotid bruit, supple, symmetrical, trachea midline and thyroid not enlarged, symmetric, no tenderness/mass/nodules Back: symmetric, no curvature. ROM normal. No CVA tenderness. Lungs: clear to auscultation bilaterally Heart: regular rate and rhythm, S1, S2 normal, systolic crescendo murmur hear best at aortic  no murmur, click, rub or gallop Abdomen: soft, non-tender; bowel sounds normal; no masses,  no organomegaly Pulses: 2+ and symmetric Skin: Skin color, texture, turgor normal. No rashes or lesions Lymph nodes: Cervical, supraclavicular, and axillary nodes normal.  Lab Results  Component Value Date   HGBA1C 6.2 07/05/2021   HGBA1C 6.2  02/22/2021   HGBA1C 6.0 11/18/2020    Lab Results  Component Value Date   CREATININE 1.09 07/05/2021   CREATININE 1.06 06/28/2021   CREATININE 1.12 06/21/2021    Lab Results  Component Value Date   WBC 7.5 06/21/2021   HGB 14.7 06/21/2021   HCT 42.0 06/21/2021   PLT 368 06/21/2021   GLUCOSE 90 07/05/2021   CHOL 183 07/05/2021   TRIG 294.0 (H) 07/05/2021   HDL 43.60 07/05/2021   LDLDIRECT 103.0 07/05/2021   LDLCALC 90 06/21/2021   ALT 31 07/05/2021   AST 25 07/05/2021   NA 139 07/05/2021   K 4.1 07/05/2021   CL 103 07/05/2021   CREATININE 1.09 07/05/2021   BUN 13 07/05/2021   CO2 28 07/05/2021   TSH 1.06 04/29/2020   PSA 0.45 04/29/2020  HGBA1C 6.2 07/05/2021   MICROALBUR 5.6 (H) 02/22/2021    US Venous Img Lower Unilateral Right (DVT)  Result Date: 07/18/2021 CLINICAL DATA:  Knee swelling and numbness EXAM: RIGHT LOWER EXTREMITY VENOUS DOPPLER ULTRASOUND TECHNIQUE: Gray-scale sonography with compression, as well as color and duplex ultrasound, were performed to evaluate the deep venous system(s) from the level of the common femoral vein through the popliteal and proximal calf veins. COMPARISON:  None available FINDINGS: VENOUS Normal compressibility of the common femoral, superficial femoral, and popliteal veins, as well as the visualized calf veins. Visualized portions of profunda femoral vein and great saphenous vein unremarkable. No filling defects to suggest DVT on grayscale or color Doppler imaging. Doppler waveforms show normal direction of venous flow, normal respiratory plasticity and response to augmentation. Limited views of the contralateral common femoral vein are unremarkable. OTHER None. Limitations: none IMPRESSION: 1. No right lower extremity DVT. 2. No Baker's cyst. Electronically Signed   By: Acquanetta Belling M.D.   On: 07/18/2021 16:45    Assessment & Plan:   Problem List Items Addressed This Visit     Insomnia    Low dose alprazolam prescribed for prn  use . The risks and benefits of benzodiazepine use were discussed with patient today including excessive sedation leading to respiratory depression,  impaired thinking/driving, and addiction.  Patient was advised to avoid concurrent use with alcohol, to use medication only as needed and not to share with others  .       Statin myopathy    HCaused by concurrent use of pravastatin and Genvoya.  CK has finally normalized after 7 months of suspension of pravastatin . Muscle biopsy has been done and preliminary results confirm statin damage        LLL pneumonia - Primary    Clinically recovered with azithromycin/doxycycline .  Repeat films needed in mid August       Relevant Orders   DG Chest 2 View   Systolic murmur    Heard on exam today.  ECHO ordered . Las tECHO over 5 years ago noted mild AR       Other Visit Diagnoses     Systolic murmur of aorta       Relevant Orders   ECHOCARDIOGRAM COMPLETE       I spent a total of 41  minutes with this patient in a face to face visit on the date of this encounter reviewing the last office visit with me in June,  most recent with patient's  neurologist    patient'ss diet and eating habits, home blood sugar  readings ,  most recent imaging study  previous ECHO , and post visit ordering of testing and therapeutics.    Follow-up: Return in about 5 months (around 01/08/2022) for follow up diabetes.   Sherlene Shams, MD

## 2021-08-09 DIAGNOSIS — G4700 Insomnia, unspecified: Secondary | ICD-10-CM | POA: Insufficient documentation

## 2021-08-09 NOTE — Assessment & Plan Note (Addendum)
HCaused by concurrent use of pravastatin and Genvoya.  CK has finally normalized after 7 months of suspension of pravastatin . Muscle biopsy has been done and preliminary results confirm statin damage

## 2021-08-09 NOTE — Assessment & Plan Note (Signed)
Clinically recovered with azithromycin/doxycycline .  Repeat films needed in mid August

## 2021-08-09 NOTE — Assessment & Plan Note (Signed)
Low dose alprazolam prescribed for prn use . The risks and benefits of benzodiazepine use were discussed with patient today including excessive sedation leading to respiratory depression,  impaired thinking/driving, and addiction.  Patient was advised to avoid concurrent use with alcohol, to use medication only as needed and not to share with others  .

## 2021-08-09 NOTE — Progress Notes (Incomplete)
Subjective:  Patient ID: Adam Becker, male    DOB: 18-Apr-1955  Age: 66 y.o. MRN: [id number]  CC: The encounter diagnosis was Pneumonia of left lower lobe due to infectious organism.   HPI Adam Becker presents for follow up on LLL pneumonia diagnosed in June by chest x ray .  He feels much better having completed a round of dual antibiotics a, and a prednisone taper.  Chief Complaint  Patient presents with  . Follow-up    Follow up after steroid taper   2) his right lower leg posterior discomfort has been evaluated by ultrasound and no Baker's cyst was found.  His symptoms are limited to  intermittent numbness in calf, without cramping.  Ultrasound was negative   3) DM :  he  feels generally well,  But is not  exercising regularly due to work . Checking  blood sugars less than once daily at variable times, usually only if he feels she may be having a hypoglycemic event. .  BS have been under 130 fasting and < 150 post prandially.  Denies any recent hypoglyemic events.  Taking   Jardiance and Metformin  as directed. Following a carbohydrate modified diet 6 days per week. Denies numbness, burning and tingling of extremities. Appetite is good.     Outpatient Medications Prior to Visit  Medication Sig Dispense Refill  . acetaminophen (TYLENOL) 500 MG tablet Take 1,000 mg by mouth every 6 (six) hours as needed for moderate pain or headache.    . albuterol (VENTOLIN HFA) 108 (90 Base) MCG/ACT inhaler Inhale 2 puffs into the lungs every 6 (six) hours as needed for wheezing or shortness of breath. 8 g 2  . allopurinol (ZYLOPRIM) 100 MG tablet TAKE 1 TABLET BY MOUTH EVERY DAY 90 tablet 3  . aspirin EC 81 MG tablet Take 1 tablet (81 mg total) by mouth daily. 90 tablet 3  . blood glucose meter kit and supplies KIT Dispense based on patient and insurance preference. Use up to four times daily as directed. 1 each 11  . cholecalciferol (VITAMIN D3) 25 MCG (1000 UNIT) tablet Take 1,000 Units  by mouth daily.    . darunavir (PREZISTA) 800 MG tablet Take 1 tablet (800 mg total) by mouth daily. 30 tablet 11  . doxycycline (VIBRA-TABS) 100 MG tablet Take 1 tablet (100 mg total) by mouth 2 (two) times daily. 14 tablet 0  . elvitegravir-cobicistat-emtricitabine-tenofovir (GENVOYA) 150-150-200-10 MG TABS tablet Take 1 tablet by mouth daily with breakfast. 30 tablet 11  . ezetimibe (ZETIA) 10 MG tablet Take 1 tablet (10 mg total) by mouth daily. 90 tablet 3  . famotidine (PEPCID) 10 MG tablet Take 10 mg by mouth 2 (two) times daily as needed for heartburn or indigestion.    . gabapentin (NEURONTIN) 300 MG capsule TAKE 2 CAPSULES BY MOUTH 3 TIMES DAILY. 180 capsule 5  . JARDIANCE 10 MG TABS tablet TAKE 1 TABLET BY MOUTH DAILY BEFORE BREAKFAST. 30 tablet 11  . Lancets (ONETOUCH ULTRASOFT) lancets Use as instructed to check glucose BID 100 each 12  . loratadine (CLARITIN) 10 MG tablet Take 10 mg by mouth daily as needed for allergies.    . metFORMIN (GLUCOPHAGE) 500 MG tablet TAKE 1 TABLET BY MOUTH IN THE MORNING AND 2 TABLETS BY MOUTH IN THE EVENING 270 tablet 3  . ONETOUCH VERIO test strip USE AS INSTRUCTED TO CHECK GLUCOSE TWICE A DAY 100 strip 12  . rOPINIRole (REQUIP) 1 MG tablet Take 1.5  mg by mouth 4 (four) times daily.    Marland Kitchen telmisartan (MICARDIS) 40 MG tablet TAKE 1 TABLET BY MOUTH EVERYDAY AT BEDTIME 90 tablet 1  . triamcinolone (NASACORT) 55 MCG/ACT AERO nasal inhaler Place 2 sprays into the nose daily. 1 each 3  . vitamin B-12 (CYANOCOBALAMIN) 1000 MCG tablet Take 1,000 mcg by mouth daily.    Marland Kitchen azithromycin (ZITHROMAX) 500 MG tablet Take 1 tablet (500 mg total) by mouth daily. 7 tablet 0  . predniSONE (DELTASONE) 10 MG tablet 6 tablets on Day 1 , then reduce by 1 tablet daily until gone (Patient not taking: Reported on 08/08/2021) 21 tablet 0   No facility-administered medications prior to visit.    Review of Systems;  Patient denies headache, fevers, malaise, unintentional weight  loss, skin rash, eye pain, sinus congestion and sinus pain, sore throat, dysphagia,  hemoptysis , cough, dyspnea, wheezing, chest pain, palpitations, orthopnea, edema, abdominal pain, nausea, melena, diarrhea, constipation, flank pain, dysuria, hematuria, urinary  Frequency, nocturia, numbness, tingling, seizures,  Focal weakness, Loss of consciousness,  Tremor, insomnia, depression, anxiety, and suicidal ideation.      Objective:  BP 112/72 (BP Location: Left Arm, Patient Position: Sitting, Cuff Size: Large)   Pulse 89   Temp 97.8 F (36.6 C) (Oral)   Ht '5\' 11"'  (1.803 m)   Wt 230 lb 3.2 oz (104.4 kg)   SpO2 94%   BMI 32.11 kg/m   BP Readings from Last 3 Encounters:  08/08/21 112/72  07/05/21 130/80  06/21/21 (!) 153/91    Wt Readings from Last 3 Encounters:  08/08/21 230 lb 3.2 oz (104.4 kg)  07/05/21 229 lb (103.9 kg)  06/22/21 232 lb (105.2 kg)    General appearance: alert, cooperative and appears stated age Ears: normal TM's and external ear canals both ears Throat: lips, mucosa, and tongue normal; teeth and gums normal Neck: no adenopathy, no carotid bruit, supple, symmetrical, trachea midline and thyroid not enlarged, symmetric, no tenderness/mass/nodules Back: symmetric, no curvature. ROM normal. No CVA tenderness. Lungs: clear to auscultation bilaterally Heart: regular rate and rhythm, S1, S2 normal, no murmur, click, rub or gallop Abdomen: soft, non-tender; bowel sounds normal; no masses,  no organomegaly Pulses: 2+ and symmetric Skin: Skin color, texture, turgor normal. No rashes or lesions Lymph nodes: Cervical, supraclavicular, and axillary nodes normal.  Lab Results  Component Value Date   HGBA1C 6.2 07/05/2021   HGBA1C 6.2 02/22/2021   HGBA1C 6.0 11/18/2020    Lab Results  Component Value Date   CREATININE 1.09 07/05/2021   CREATININE 1.06 06/28/2021   CREATININE 1.12 06/21/2021    Lab Results  Component Value Date   WBC 7.5 06/21/2021   HGB 14.7  06/21/2021   HCT 42.0 06/21/2021   PLT 368 06/21/2021   GLUCOSE 90 07/05/2021   CHOL 183 07/05/2021   TRIG 294.0 (H) 07/05/2021   HDL 43.60 07/05/2021   LDLDIRECT 103.0 07/05/2021   LDLCALC 90 06/21/2021   ALT 31 07/05/2021   AST 25 07/05/2021   NA 139 07/05/2021   K 4.1 07/05/2021   CL 103 07/05/2021   CREATININE 1.09 07/05/2021   BUN 13 07/05/2021   CO2 28 07/05/2021   TSH 1.06 04/29/2020   PSA 0.45 04/29/2020   HGBA1C 6.2 07/05/2021   MICROALBUR 5.6 (H) 02/22/2021    US Venous Img Lower Unilateral Right (DVT)  Result Date: 07/18/2021 CLINICAL DATA:  Knee swelling and numbness EXAM: RIGHT LOWER EXTREMITY VENOUS DOPPLER ULTRASOUND TECHNIQUE: Gray-scale sonography with  compression, as well as color and duplex ultrasound, were performed to evaluate the deep venous system(s) from the level of the common femoral vein through the popliteal and proximal calf veins. COMPARISON:  None available FINDINGS: VENOUS Normal compressibility of the common femoral, superficial femoral, and popliteal veins, as well as the visualized calf veins. Visualized portions of profunda femoral vein and great saphenous vein unremarkable. No filling defects to suggest DVT on grayscale or color Doppler imaging. Doppler waveforms show normal direction of venous flow, normal respiratory plasticity and response to augmentation. Limited views of the contralateral common femoral vein are unremarkable. OTHER None. Limitations: none IMPRESSION: 1. No right lower extremity DVT. 2. No Baker's cyst. Electronically Signed   By: Miachel Roux M.D.   On: 07/18/2021 16:45    Assessment & Plan:   Problem List Items Addressed This Visit     LLL pneumonia - Primary   Relevant Orders   DG Chest 2 View    I spent a total of   minutes with this patient in a face to face visit on the date of this encounter reviewing the last office visit with me on        ,  most recent with patient's cardiologist in    ,  patient'ss diet and  eating habits, home blood pressure readings ,  most recent imaging study ,   and post visit ordering of testing and therapeutics.    Follow-up: No follow-ups on file.   [name], MD

## 2021-08-09 NOTE — Assessment & Plan Note (Signed)
Heard on exam today.  ECHO ordered . Villanueva over 5 years ago noted mild AR

## 2021-08-15 ENCOUNTER — Telehealth: Payer: Self-pay | Admitting: Internal Medicine

## 2021-08-15 NOTE — Telephone Encounter (Signed)
Spoke with pt and he stated that Saturday night he picked up a generator and thinks he pulled a muscle in his left shoulder. Pt stated that did not really start hurting until yesterday and when he woke up this morning it was hurting so bad he had a hard time getting out of bed. Pt has been scheduled for tomorrow at 11 am.

## 2021-08-15 NOTE — Telephone Encounter (Signed)
Pt called stating he pulled a muscle in his shoulder on the left side and could not hardly get up out of bed. Pt stated it is painful.

## 2021-08-16 ENCOUNTER — Encounter: Payer: Self-pay | Admitting: Internal Medicine

## 2021-08-16 ENCOUNTER — Other Ambulatory Visit: Payer: Self-pay | Admitting: Internal Medicine

## 2021-08-16 ENCOUNTER — Ambulatory Visit (INDEPENDENT_AMBULATORY_CARE_PROVIDER_SITE_OTHER): Payer: Medicare Other | Admitting: Internal Medicine

## 2021-08-16 ENCOUNTER — Ambulatory Visit (INDEPENDENT_AMBULATORY_CARE_PROVIDER_SITE_OTHER): Payer: Medicare Other

## 2021-08-16 VITALS — Ht 71.0 in | Wt 232.0 lb

## 2021-08-16 DIAGNOSIS — Z0000 Encounter for general adult medical examination without abnormal findings: Secondary | ICD-10-CM

## 2021-08-16 DIAGNOSIS — S46812A Strain of other muscles, fascia and tendons at shoulder and upper arm level, left arm, initial encounter: Secondary | ICD-10-CM | POA: Diagnosis not present

## 2021-08-16 MED ORDER — TIZANIDINE HCL 4 MG PO TABS: 4.0000 mg | ORAL_TABLET | Freq: Four times a day (QID) | ORAL | 0 refills | Status: DC | PRN

## 2021-08-16 NOTE — Assessment & Plan Note (Signed)
OCCURRED ON SUNDAY AFTER LIFTING A GENERATOR WITHOUT ASSISTANCE.  Strength and DTRS are normal.  NSADIS/Analgesics,  Ice and activity modification recommended.

## 2021-08-16 NOTE — Progress Notes (Signed)
Subjective:  Patient ID: Adam Becker, male    DOB: 09-06-1955  Age: 66 y.o. MRN: [id number]  CC: The encounter diagnosis was Trapezius muscle strain, left, initial encounter.   HPI Adam Becker presents for  Chief Complaint  Patient presents with   Acute Visit    Pulled muscle in left shoulder   66 yr old male presents with left shoulder pain that radiates to neck that began on Sunday ,after lifting a generator without assistance.  started with burning feeling in the mid trapezius muscle .  Took tylenol which helped,  but pain became progressively worse as the day progressed.  Hurts to turn head suddenly.  Pain radiates to insertion point on SCM .  No numbness or weakness.  [name] at the advice of his pharmacist he took 800 mg motrin and 500 mg tylenol which helped.   Outpatient Medications Prior to Visit  Medication Sig Dispense Refill   acetaminophen (TYLENOL) 500 MG tablet Take 1,000 mg by mouth every 6 (six) hours as needed for moderate pain or headache.     albuterol (VENTOLIN HFA) 108 (90 Base) MCG/ACT inhaler Inhale 2 puffs into the lungs every 6 (six) hours as needed for wheezing or shortness of breath. 8 g 2   allopurinol (ZYLOPRIM) 100 MG tablet TAKE 1 TABLET BY MOUTH EVERY DAY 90 tablet 3   ALPRAZolam (XANAX) 0.25 MG tablet Take 1 tablet (0.25 mg total) by mouth at bedtime as needed for anxiety. 10 tablet 0   aspirin EC 81 MG tablet Take 1 tablet (81 mg total) by mouth daily. 90 tablet 3   blood glucose meter kit and supplies KIT Dispense based on patient and insurance preference. Use up to four times daily as directed. 1 each 11   cholecalciferol (VITAMIN D3) 25 MCG (1000 UNIT) tablet Take 1,000 Units by mouth daily.     darunavir (PREZISTA) 800 MG tablet Take 1 tablet (800 mg total) by mouth daily. 30 tablet 11   elvitegravir-cobicistat-emtricitabine-tenofovir (GENVOYA) 150-150-200-10 MG TABS tablet Take 1 tablet by mouth daily with breakfast. 30 tablet 11    ezetimibe (ZETIA) 10 MG tablet Take 1 tablet (10 mg total) by mouth daily. 90 tablet 3   famotidine (PEPCID) 10 MG tablet Take 10 mg by mouth 2 (two) times daily as needed for heartburn or indigestion.     gabapentin (NEURONTIN) 300 MG capsule TAKE 2 CAPSULES BY MOUTH 3 TIMES DAILY. 180 capsule 5   JARDIANCE 10 MG TABS tablet TAKE 1 TABLET BY MOUTH DAILY BEFORE BREAKFAST. 30 tablet 11   Lancets (ONETOUCH ULTRASOFT) lancets Use as instructed to check glucose BID 100 each 12   loratadine (CLARITIN) 10 MG tablet Take 10 mg by mouth daily as needed for allergies.     metFORMIN (GLUCOPHAGE) 500 MG tablet TAKE 1 TABLET BY MOUTH IN THE MORNING AND 2 TABLETS BY MOUTH IN THE EVENING 270 tablet 3   ONETOUCH VERIO test strip USE AS INSTRUCTED TO CHECK GLUCOSE TWICE A DAY 100 strip 12   rOPINIRole (REQUIP) 1 MG tablet Take 1.5 mg by mouth 4 (four) times daily.     telmisartan (MICARDIS) 40 MG tablet TAKE 1 TABLET BY MOUTH EVERYDAY AT BEDTIME 90 tablet 1   triamcinolone (NASACORT) 55 MCG/ACT AERO nasal inhaler Place 2 sprays into the nose daily. 1 each 3   vitamin B-12 (CYANOCOBALAMIN) 1000 MCG tablet Take 1,000 mcg by mouth daily.     No facility-administered medications prior to visit.  Review of Systems;  Patient denies headache, fevers, malaise, unintentional weight loss, skin rash, eye pain, sinus congestion and sinus pain, sore throat, dysphagia,  hemoptysis , cough, dyspnea, wheezing, chest pain, palpitations, orthopnea, edema, abdominal pain, nausea, melena, diarrhea, constipation, flank pain, dysuria, hematuria, urinary  Frequency, nocturia, numbness, tingling, seizures,  Focal weakness, Loss of consciousness,  Tremor, insomnia, depression, anxiety, and suicidal ideation.      Objective:  BP 112/70 (BP Location: Left Arm, Patient Position: Sitting, Cuff Size: Large)   Pulse 84   Temp 98.1 F (36.7 C) (Oral)   Ht _0  (1.803 m)   Wt 232 lb 6.4 oz (105.4 kg)   SpO2 95%   BMI 32.41 kg/m    BP Readings from Last 3 Encounters:  08/16/21 112/70  08/08/21 112/72  07/05/21 130/80    Wt Readings from Last 3 Encounters:  08/16/21 232 lb 6.4 oz (105.4 kg)  08/08/21 230 lb 3.2 oz (104.4 kg)  07/05/21 229 lb (103.9 kg)    General appearance: alert, cooperative and appears stated age Ears: normal TM's and external ear canals both ears Throat: lips, mucosa, and tongue normal; teeth and gums normal Neck: no adenopathy, no carotid bruit, supple, symmetrical, trachea midline and thyroid not enlarged, symmetric, no tenderness/mass/nodules.  Full ROM  Back: symmetric, no curvature. ROM normal. No CVA tenderness. Lungs: clear to auscultation bilaterally Heart: regular rate and rhythm, S1, S2 normal, no murmur, click, rub or gallop MSK:  Left shoulder with full ROM.  No point tenderness.  No muscle spasm.  Distal strength 5/5 and DTR's normal     Lab Results  Component Value Date   HGBA1C 6.2 07/05/2021   HGBA1C 6.2 02/22/2021   HGBA1C 6.0 11/18/2020    Lab Results  Component Value Date   CREATININE 1.09 07/05/2021   CREATININE 1.06 06/28/2021   CREATININE 1.12 06/21/2021    Lab Results  Component Value Date   WBC 7.5 06/21/2021   HGB 14.7 06/21/2021   HCT 42.0 06/21/2021   PLT 368 06/21/2021   GLUCOSE 90 07/05/2021   CHOL 183 07/05/2021   TRIG 294.0 (H) 07/05/2021   HDL 43.60 07/05/2021   LDLDIRECT 103.0 07/05/2021   LDLCALC 90 06/21/2021   ALT 31 07/05/2021   AST 25 07/05/2021   NA 139 07/05/2021   K 4.1 07/05/2021   CL 103 07/05/2021   CREATININE 1.09 07/05/2021   BUN 13 07/05/2021   CO2 28 07/05/2021   TSH 1.06 04/29/2020   PSA 0.45 04/29/2020   HGBA1C 6.2 07/05/2021   MICROALBUR 5.6 (H) 02/22/2021    US Venous Img Lower Unilateral Right (DVT)  Result Date: 07/18/2021 CLINICAL DATA:  Knee swelling and numbness EXAM: RIGHT LOWER EXTREMITY VENOUS DOPPLER ULTRASOUND TECHNIQUE: Gray-scale sonography with compression, as well as color and duplex  ultrasound, were performed to evaluate the deep venous system(s) from the level of the common femoral vein through the popliteal and proximal calf veins. COMPARISON:  None available FINDINGS: VENOUS Normal compressibility of the common femoral, superficial femoral, and popliteal veins, as well as the visualized calf veins. Visualized portions of profunda femoral vein and great saphenous vein unremarkable. No filling defects to suggest DVT on grayscale or color Doppler imaging. Doppler waveforms show normal direction of venous flow, normal respiratory plasticity and response to augmentation. Limited views of the contralateral common femoral vein are unremarkable. OTHER None. Limitations: none IMPRESSION: 1. No right lower extremity DVT. 2. No Baker's cyst. Electronically Signed   By: Sharen Heck  Mir M.D.   On: 07/18/2021 16:45    Assessment & Plan:   Problem List Items Addressed This Visit     Trapezius muscle strain, left, initial encounter    OCCURRED ON SUNDAY AFTER LIFTING A GENERATOR WITHOUT ASSISTANCE.  Strength and DTRS are normal.  NSADIS/Analgesics,  Ice and activity modification recommended.         Follow-up: No follow-ups on file.   [name], MD

## 2021-08-16 NOTE — Patient Instructions (Addendum)
  Adam Becker , Thank you for taking time to come for your Medicare Wellness Visit. I appreciate your ongoing commitment to your health goals. Please review the following plan we discussed and let me know if I can assist you in the future.   These are the goals we discussed:  Goals      Weight goal 215lb     Low carb foods. Stay hydrated. Stay active and continue to walk for exercise.         This is a list of the screening recommended for you and due dates:  Health Maintenance  Topic Date Due   Eye exam for diabetics  09/23/2021*   Meningococcal B Vaccine (1 of 4 - Increased Risk) 09/23/2021*   Flu Shot  08/23/2021   Hemoglobin A1C  01/04/2022   Complete foot exam   02/22/2022   Tetanus Vaccine  10/22/2030   Colon Cancer Screening  12/22/2030   COVID-19 Vaccine  Completed   Hepatitis C Screening: USPSTF Recommendation to screen - Ages 18-79 yo.  Completed   HIV Screening  Completed   Zoster (Shingles) Vaccine  Completed   HPV Vaccine  Aged Out  *Topic was postponed. The date shown is not the original due date.

## 2021-08-16 NOTE — Patient Instructions (Addendum)
You strained your trapezius muscle  .  This will take several weeks to resolve.  Longer if you do any more lifting of heavy objects.   You can use 800 mfg of Ibuprofen (Advil /motrin )  AND COMBINE WITH  1000 mg tylenol .  DO THIS EVERY  12 hours  Apply ice packs for 15 minutes every 3 to 4 hours ..  I WILL CALL IN A MUSCLE RELAXER TO USE IF THE MUSCLE STARTS TO SPASM (TIZANIDINE). IT MAY MAKE YOU SLEEPY   WALK YOUR FINGERS UP THE WALL EVERY DAY TO PREVENT A FROZEN SHOULDER

## 2021-08-16 NOTE — Progress Notes (Addendum)
Subjective:   Adam Becker is a 66 y.o. male who presents for Medicare Annual/Subsequent preventive examination.  Review of Systems    No ROS.  Medicare Wellness Virtual Visit.  Visual/audio telehealth visit, UTA vital signs.   See social history for additional risk factors.   Cardiac Risk Factors include: advanced age (>11mn, >>26women);male gender;diabetes mellitus;hypertension     Objective:    Today's Vitals   08/16/21 1334  Weight: 232 lb (105.2 kg)  Height: '5\' 11"'  (1.803 m)   Body mass index is 32.36 kg/m.     08/16/2021    2:24 PM 04/01/2021    6:28 AM 03/23/2021    3:34 PM 12/21/2020    9:21 AM 09/08/2020   10:56 AM 08/12/2020    4:35 PM 08/11/2019    8:41 AM  Advanced Directives  Does Patient Have a Medical Advance Directive? No No No No No No No  Would patient like information on creating a medical advance directive? No - Patient declined No - Patient declined  No - Patient declined No - Patient declined No - Patient declined No - Patient declined    Current Medications (verified) Outpatient Encounter Medications as of 08/16/2021  Medication Sig   acetaminophen (TYLENOL) 500 MG tablet Take 1,000 mg by mouth every 6 (six) hours as needed for moderate pain or headache.   albuterol (VENTOLIN HFA) 108 (90 Base) MCG/ACT inhaler Inhale 2 puffs into the lungs every 6 (six) hours as needed for wheezing or shortness of breath.   allopurinol (ZYLOPRIM) 100 MG tablet TAKE 1 TABLET BY MOUTH EVERY DAY   ALPRAZolam (XANAX) 0.25 MG tablet Take 1 tablet (0.25 mg total) by mouth at bedtime as needed for anxiety.   aspirin EC 81 MG tablet Take 1 tablet (81 mg total) by mouth daily.   blood glucose meter kit and supplies KIT Dispense based on patient and insurance preference. Use up to four times daily as directed.   cholecalciferol (VITAMIN D3) 25 MCG (1000 UNIT) tablet Take 1,000 Units by mouth daily.   darunavir (PREZISTA) 800 MG tablet Take 1 tablet (800 mg total) by mouth  daily.   elvitegravir-cobicistat-emtricitabine-tenofovir (GENVOYA) 150-150-200-10 MG TABS tablet Take 1 tablet by mouth daily with breakfast.   ezetimibe (ZETIA) 10 MG tablet Take 1 tablet (10 mg total) by mouth daily.   famotidine (PEPCID) 10 MG tablet Take 10 mg by mouth 2 (two) times daily as needed for heartburn or indigestion.   gabapentin (NEURONTIN) 300 MG capsule TAKE 2 CAPSULES BY MOUTH 3 TIMES DAILY.   JARDIANCE 10 MG TABS tablet TAKE 1 TABLET BY MOUTH DAILY BEFORE BREAKFAST.   Lancets (ONETOUCH ULTRASOFT) lancets Use as instructed to check glucose BID   loratadine (CLARITIN) 10 MG tablet Take 10 mg by mouth daily as needed for allergies.   metFORMIN (GLUCOPHAGE) 500 MG tablet TAKE 1 TABLET BY MOUTH IN THE MORNING AND 2 TABLETS BY MOUTH IN THE EVENING   ONETOUCH VERIO test strip USE AS INSTRUCTED TO CHECK GLUCOSE TWICE A DAY   rOPINIRole (REQUIP) 1 MG tablet Take 1.5 mg by mouth 4 (four) times daily.   telmisartan (MICARDIS) 40 MG tablet TAKE 1 TABLET BY MOUTH EVERYDAY AT BEDTIME   tiZANidine (ZANAFLEX) 4 MG tablet Take 1 tablet (4 mg total) by mouth every 6 (six) hours as needed for muscle spasms.   triamcinolone (NASACORT) 55 MCG/ACT AERO nasal inhaler Place 2 sprays into the nose daily.   vitamin B-12 (CYANOCOBALAMIN) 1000 MCG tablet  Take 1,000 mcg by mouth daily.   No facility-administered encounter medications on file as of 08/16/2021.    Allergies (verified) Pravastatin   History: Past Medical History:  Diagnosis Date   1st degree AV block    seen on ekg   Asplenia 11/11/2014   Chronic kidney disease 11/2011   admission for [facility] cr 11, acidosis   Diabetes mellitus without complication (HCC)    Type II   GERD (gastroesophageal reflux disease)    Gout    Heart murmur    HIV infection ([place]) 1994   Hyperglycemia 04/04/2016   Hyperlipidemia    Hypertension    Impaired fasting glucose 07/27/2015   Myopathy 06/21/2021   Neuromuscular disorder ([place])    peripheral  neuropathy   Neuropathy    Umbilical hernia    Urethritis 02/11/2015   Past Surgical History:  Procedure Laterality Date   COLONOSCOPY     LIPOSUCTION HEAD / NECK  2001   posterior neck   MUSCLE BIOPSY Right 04/01/2021   Procedure: MUSCLE BIOPSY;  Surgeon: Robert Bellow, MD;  Location: [facility] ORS;  Service: General;  Laterality: Right;  gastrocnemius; O.R. to notify pathology of procedure   SPLENECTOMY  2000   reason unclear "stopped working"   Family History  Problem Relation Age of Onset   Cancer Mother    Breast cancer Mother    Colon polyps Father    Colon cancer Father    Birth defects Son        Breast    Rectal cancer Neg Hx    Stomach cancer Neg Hx    Social History   Socioeconomic History   Marital status: Single    Spouse name: Not on file   Number of children: Not on file   Years of education: Not on file   Highest education level: Not on file  Occupational History   Not on file  Tobacco Use   Smoking status: Never   Smokeless tobacco: Never  Vaping Use   Vaping Use: Never used  Substance and Sexual Activity   Alcohol use: No   Drug use: No   Sexual activity: Not Currently    Comment: declined condoms  Other Topics Concern   Not on file  Social History Narrative   Independent baseline. Lives by himself.  Education 12th.  Children none.  Caffeine tea one cup 4 x week.   Social Determinants of Health   Financial Resource Strain: Low Risk  (08/16/2021)   Overall Financial Resource Strain (CARDIA)    Difficulty of Paying Living Expenses: Not hard at all  Food Insecurity: No Food Insecurity (08/16/2021)   Hunger Vital Sign    Worried About Running Out of Food in the Last Year: Never true    Ran Out of Food in the Last Year: Never true  Transportation Needs: No Transportation Needs (08/16/2021)   PRAPARE - Hydrologist (Medical): No    Lack of Transportation (Non-Medical): No  Physical Activity: Sufficiently Active  (08/12/2020)   Exercise Vital Sign    Days of Exercise per Week: 7 days    Minutes of Exercise per Session: 30 min  Stress: No Stress Concern Present (08/16/2021)   [place]    Feeling of Stress : Not at all  Social Connections: Unknown (08/16/2021)   Social Connection and Isolation Panel [NHANES]    Frequency of Communication with Friends and Family: More than three times  a week    Frequency of Social Gatherings with Friends and Family: More than three times a week    Attends Religious Services: More than 4 times per year    Active Member of [employer] or Organizations: Yes    Attends Music therapist: More than 4 times per year    Marital Status: Not on file    Tobacco Counseling Counseling given: Not Answered   Clinical Intake:  Pre-visit preparation completed: Yes           How often do you need to have someone help you when you read instructions, pamphlets, or other written materials from your doctor or pharmacy?: 1 - Never    Interpreter Needed?: No    Activities of Daily Living    08/16/2021    2:25 PM 03/23/2021    3:06 PM  In your present state of health, do you have any difficulty performing the following activities:  Hearing? 0   Vision? 0   Difficulty concentrating or making decisions? 0   Walking or climbing stairs? 0   Dressing or bathing? 0   Doing errands, shopping? 0 0  Preparing Food and eating ? N   Using the Toilet? N   In the past six months, have you accidently leaked urine? N   Do you have problems with loss of bowel control? N   Managing your Medications? N   Managing your Finances? N   Housekeeping or managing your Housekeeping? N    Patient Care Team: [name], MD as PCP - General (Internal Medicine) [name], [name], MD as PCP - Infectious Diseases (Infectious Diseases)  Indicate any recent Medical Services you may have received from other than [facility]  providers in the past year (date may be approximate).     Assessment:   This is a routine wellness examination for Adam Becker.  Virtual Visit via Telephone Note  I connected with  Adam Becker on 08/16/21 at  1:30 PM EDT by telephone and verified that I am speaking with the correct person using two identifiers.  Persons participating in the virtual visit: patient/Nurse Health Advisor   I discussed the limitations of performing an evaluation and management service by telehealth. We continued and completed visit with audio only. Some vital signs may be absent or patient reported.    Hearing/Vision screen Hearing Screening - Comments:: Patient is able to hear conversational tones without difficulty.  No issues reported.  Vision Screening - Comments:: Followed by [facility] No retinopathy reported. They have seen their ophthalmologist in the last 12 months.   Dietary issues and exercise activities discussed: Current Exercise Habits: Home exercise routine, Intensity: Mild Regular diet Good water intake   Goals Addressed             This Visit's Progress    Weight goal 215lb       Low carb foods. Stay hydrated. Stay active and continue to walk for exercise.        Depression Screen    08/16/2021    1:37 PM 06/21/2021    4:04 PM 06/06/2021   11:12 AM 02/22/2021   12:57 PM 10/19/2020    1:59 PM 08/12/2020    4:30 PM 07/01/2020   11:24 AM  PHQ 2/9 Scores  PHQ - 2 Score 0 0 0 0 0 0 0    Fall Risk    08/16/2021    2:25 PM 07/05/2021   11:04 AM 06/21/2021  4:04 PM 06/06/2021   11:12 AM 02/22/2021   12:57 PM  Fall Risk   Falls in the past year? 0 0 0 0 0  Number falls in past yr: 0      Risk for fall due to :  No Fall Risks No Fall Risks No Fall Risks No Fall Risks  Follow up Falls evaluation completed Falls evaluation completed Falls evaluation completed Falls evaluation completed Falls evaluation completed    [place]: Home  free of loose throw rugs in walkways, pet beds, electrical cords, etc? Yes  Adequate lighting in your home to reduce risk of falls? Yes   ASSISTIVE DEVICES UTILIZED TO PREVENT FALLS: Life alert? No  Use of a cane, walker or w/c? No  Grab bars in the bathroom? No   TIMED UP AND GO: Was the test performed? No .   Cognitive Function: Patient is alert and oriented x3.      07/31/2017    9:01 AM 07/25/2016    3:17 PM  MMSE - Mini Mental State Exam  Orientation to time 5 5  Orientation to Place 5 5  Registration 3 3  Attention/ Calculation 5 5  Recall 3 3  Language- name 2 objects 2 2  Language- repeat 1 1  Language- follow 3 step command 3 3  Language- read & follow direction 1 1  Write a sentence 1 1  Copy design 1 1  Total score 30 30        08/12/2020    4:36 PM 08/11/2019    8:55 AM 08/08/2018    8:53 AM  6CIT Screen  What Year? 0 points 0 points 0 points  What month? 0 points 0 points 0 points  What time? 0 points 0 points 0 points  Count back from 20 0 points  0 points  Months in reverse 0 points  0 points  Repeat phrase 0 points  0 points  Total Score 0 points  0 points    Immunizations Immunization History  Administered Date(s) Administered   H1N1 01/02/2008   Hepatitis A 12/31/2006, 07/15/2007   Hepatitis B 03/26/2001, 05/06/2001, 10/14/2001   Influenza Inj Mdck Quad Pf 10/12/2017   Influenza Split 11/15/2010, 12/07/2011   Influenza Whole 10/02/2005, 10/15/2006, 11/26/2008, 11/01/2009   Influenza,inj,Quad PF,6+ Mos 10/07/2012, 11/05/2013, 11/11/2014, 10/12/2015, 10/10/2016, 10/24/2019, 10/11/2020   Influenza-Unspecified 09/28/2018   Meningococcal Conjugate 10/09/2019   Meningococcal Polysaccharide 10/12/2015   Moderna Covid-19 Vaccine Bivalent Booster 5yr & up 10/11/2020   PFIZER Comirnaty(Gray Top)Covid-19 Tri-Sucrose Vaccine 06/10/2020   PFIZER(Purple Top)SARS-COV-2 Vaccination 03/28/2019, 04/18/2019, 09/23/2019   Pneumococcal Conjugate-13 06/21/2011    Pneumococcal Polysaccharide-23 09/23/2005, 10/02/2005, 02/29/2012, 09/23/2019   Tdap 10/21/2020   Zoster Recombinat (Shingrix) 08/20/2020, 11/09/2020   Screening Tests Health Maintenance  Topic Date Due   OPHTHALMOLOGY EXAM  09/23/2021 (Originally 05/20/2021)   Meningococcal B Vaccine (1 of 4 - Increased Risk) 09/23/2021 (Originally 10/03/1965)   INFLUENZA VACCINE  08/23/2021   HEMOGLOBIN A1C  01/04/2022   FOOT EXAM  02/22/2022   TETANUS/TDAP  10/22/2030   COLONOSCOPY (Pts 45-494yrInsurance coverage will need to be confirmed)  12/22/2030   COVID-19 Vaccine  Completed   Hepatitis C Screening  Completed   HIV Screening  Completed   Zoster Vaccines- Shingrix  Completed   HPV VACCINES  Aged Out   Health Maintenance There are no preventive care reminders to display for this patient.  Lung Cancer Screening: (Low Dose CT Chest recommended if Age  55-80 years, 30 pack-year currently smoking OR have quit w/in 15years.) does not qualify.   Vision Screening: Recommended annual ophthalmology exams for early detection of glaucoma and other disorders of the eye.  Dental Screening: Recommended annual dental exams for proper oral hygiene  Community Resource Referral / Chronic Care Management: CRR required this visit?  No   CCM required this visit?  No      Plan:   Keep all routine maintenance appointments.   I have personally reviewed and noted the following in the patient's chart:   Medical and social history Use of alcohol, tobacco or illicit drugs  Current medications and supplements including opioid prescriptions. Patient is not currently taking opioid prescriptions. Functional ability and status Nutritional status Physical activity Advanced directives List of other physicians Hospitalizations, surgeries, and ER visits in previous 12 months Vitals Screenings to include cognitive, depression, and falls Referrals and appointments  In addition, I have reviewed and discussed  with patient certain preventive protocols, quality metrics, and best practice recommendations. A written personalized care plan for preventive services as well as general preventive health recommendations were provided to patient.     OBrien-Blaney, Eyvonne Burchfield L, LPN   1/[id number]/7903    I have reviewed the above information and agree with above.   Deborra Medina, MD

## 2021-08-19 ENCOUNTER — Telehealth: Payer: Self-pay | Admitting: Internal Medicine

## 2021-08-19 DIAGNOSIS — G629 Polyneuropathy, unspecified: Secondary | ICD-10-CM

## 2021-08-19 MED ORDER — GABAPENTIN 300 MG PO CAPS: ORAL_CAPSULE | ORAL | 5 refills | Status: DC

## 2021-08-19 NOTE — Telephone Encounter (Signed)
Medication has been refilled and pt is aware.  

## 2021-08-19 NOTE — Addendum Note (Signed)
Addended by: Adair Laundry on: 08/19/2021 04:47 PM   Modules accepted: Orders

## 2021-08-19 NOTE — Telephone Encounter (Signed)
Pt wife called in requesting refill on medication (gabapentin (NEURONTIN) 300 MG capsule)... Pt requesting callback

## 2021-08-23 ENCOUNTER — Telehealth: Payer: Self-pay

## 2021-08-23 NOTE — Telephone Encounter (Signed)
Patient states his cough and wheezing came back and he would like to know if he should continue using the inhaler Dr. Deborra Medina gave him when he had pneumonia.  Also, patient states he is supposed to come back to have an x-ray around mid-August.  Patient states he hasn't scheduled an appointment for the x-ray yet, and now that his cough and wheezing have come back, he would like to know if he should come in earlier for the x-ray.

## 2021-08-23 NOTE — Telephone Encounter (Signed)
Pt called and informed in regards to inhaler. He is also scheduled to have chest xray done on Thursday.

## 2021-08-25 ENCOUNTER — Ambulatory Visit (INDEPENDENT_AMBULATORY_CARE_PROVIDER_SITE_OTHER): Payer: Medicare Other

## 2021-08-25 ENCOUNTER — Other Ambulatory Visit: Payer: Medicare Other

## 2021-08-25 DIAGNOSIS — J189 Pneumonia, unspecified organism: Secondary | ICD-10-CM | POA: Diagnosis not present

## 2021-09-05 ENCOUNTER — Telehealth: Payer: Self-pay | Admitting: Internal Medicine

## 2021-09-05 DIAGNOSIS — G629 Polyneuropathy, unspecified: Secondary | ICD-10-CM

## 2021-09-05 MED ORDER — GABAPENTIN 300 MG PO CAPS: ORAL_CAPSULE | ORAL | 5 refills | Status: DC

## 2021-09-05 NOTE — Telephone Encounter (Signed)
Called pt to let him know that we have resent the 300 mg dose to [facility] in [place]. Pt gave a verbal understanding.

## 2021-09-05 NOTE — Telephone Encounter (Signed)
Patient called b/c he went to the pharmacy to pick up his gabapentin (NEURONTIN) 300 MG capsule  and the pharmacy said they did not have a prescription for that amount they only had 400 mg.  Patient would like to know if there any changes.

## 2021-09-21 DIAGNOSIS — G4733 Obstructive sleep apnea (adult) (pediatric): Secondary | ICD-10-CM | POA: Diagnosis not present

## 2021-09-21 DIAGNOSIS — G2581 Restless legs syndrome: Secondary | ICD-10-CM | POA: Diagnosis not present

## 2021-09-21 DIAGNOSIS — R252 Cramp and spasm: Secondary | ICD-10-CM | POA: Diagnosis not present

## 2021-09-21 DIAGNOSIS — G5603 Carpal tunnel syndrome, bilateral upper limbs: Secondary | ICD-10-CM | POA: Diagnosis not present

## 2021-09-21 DIAGNOSIS — G608 Other hereditary and idiopathic neuropathies: Secondary | ICD-10-CM | POA: Diagnosis not present

## 2021-09-21 DIAGNOSIS — M79661 Pain in right lower leg: Secondary | ICD-10-CM | POA: Diagnosis not present

## 2021-10-02 ENCOUNTER — Other Ambulatory Visit: Payer: Self-pay | Admitting: Internal Medicine

## 2021-10-02 DIAGNOSIS — E1165 Type 2 diabetes mellitus with hyperglycemia: Secondary | ICD-10-CM

## 2021-10-17 NOTE — Telephone Encounter (Signed)
Pt called in stating he still has mucus and is still coughing. Pt would like to be called

## 2021-10-17 NOTE — Telephone Encounter (Signed)
Spoke with pt and scheduled him for a follow up this week with Dr. Derrel Nip.

## 2021-10-20 ENCOUNTER — Ambulatory Visit (INDEPENDENT_AMBULATORY_CARE_PROVIDER_SITE_OTHER): Payer: Medicare HMO | Admitting: Internal Medicine

## 2021-10-20 ENCOUNTER — Encounter: Payer: Self-pay | Admitting: Internal Medicine

## 2021-10-20 ENCOUNTER — Telehealth: Payer: Self-pay | Admitting: Internal Medicine

## 2021-10-20 ENCOUNTER — Ambulatory Visit (INDEPENDENT_AMBULATORY_CARE_PROVIDER_SITE_OTHER): Payer: Medicare HMO

## 2021-10-20 DIAGNOSIS — G729 Myopathy, unspecified: Secondary | ICD-10-CM | POA: Diagnosis not present

## 2021-10-20 DIAGNOSIS — I152 Hypertension secondary to endocrine disorders: Secondary | ICD-10-CM

## 2021-10-20 DIAGNOSIS — E669 Obesity, unspecified: Secondary | ICD-10-CM

## 2021-10-20 DIAGNOSIS — N182 Chronic kidney disease, stage 2 (mild): Secondary | ICD-10-CM | POA: Diagnosis not present

## 2021-10-20 DIAGNOSIS — G2581 Restless legs syndrome: Secondary | ICD-10-CM

## 2021-10-20 DIAGNOSIS — R748 Abnormal levels of other serum enzymes: Secondary | ICD-10-CM

## 2021-10-20 DIAGNOSIS — R058 Other specified cough: Secondary | ICD-10-CM

## 2021-10-20 DIAGNOSIS — M7918 Myalgia, other site: Secondary | ICD-10-CM | POA: Diagnosis not present

## 2021-10-20 DIAGNOSIS — R7612 Nonspecific reaction to cell mediated immunity measurement of gamma interferon antigen response without active tuberculosis: Secondary | ICD-10-CM | POA: Diagnosis not present

## 2021-10-20 DIAGNOSIS — E1169 Type 2 diabetes mellitus with other specified complication: Secondary | ICD-10-CM

## 2021-10-20 DIAGNOSIS — R059 Cough, unspecified: Secondary | ICD-10-CM

## 2021-10-20 DIAGNOSIS — R209 Unspecified disturbances of skin sensation: Secondary | ICD-10-CM

## 2021-10-20 DIAGNOSIS — B20 Human immunodeficiency virus [HIV] disease: Secondary | ICD-10-CM | POA: Diagnosis not present

## 2021-10-20 DIAGNOSIS — E1159 Type 2 diabetes mellitus with other circulatory complications: Secondary | ICD-10-CM

## 2021-10-20 MED ORDER — RSVPREF3 VAC RECOMB ADJUVANTED 120 MCG/0.5ML IM SUSR: 0.5000 mL | Freq: Once | INTRAMUSCULAR | 0 refills | Status: AC

## 2021-10-20 NOTE — Telephone Encounter (Signed)
I neglected to mention to him during today's visit that I want him to suspend the metformin for 4 weeks to see if it is the cause of his muscle pain.  He does not check mychart (computer is broken) so please call him .

## 2021-10-20 NOTE — Assessment & Plan Note (Signed)
Iron studies were normal in January.  Continue Requip

## 2021-10-20 NOTE — Assessment & Plan Note (Signed)
Cause unclear,  Has persisted despite  Discontinuation of statin.   CK is again elevated after finally normalizing .  Muscle biopsy was nonspecific for statin  damage.  Will stop metformin and repeat CK in 3 months

## 2021-10-20 NOTE — Telephone Encounter (Signed)
LMTCB

## 2021-10-20 NOTE — Progress Notes (Signed)
Subjective:  Patient ID: Adam Becker, male    DOB: 02/13/1955  Age: 66 y.o. MRN: [id number]  CC: The primary encounter diagnosis was Productive cough. Diagnoses of PARESTHESIA, HANDS, Elevated CK, Restless legs syndrome, Cough in adult, Myalgia, upper arm, Myopathy, Chronic kidney disease (CKD), stage II (mild), and Obesity, diabetes, and hypertension syndrome ([place]) were also pertinent to this visit.   HPI Adam Becker presents for persistent cough and congestion and follow up on other issues  Chief Complaint  Patient presents with   Follow-up    Follow up on cough and congestion. Pt stated that he stopped taking the mucinex. He took Alka-seltizer cold/flu nighttime last night and it seemed to help with some of his symptoms. He stated that at times he feels like something stuck in the center of his chest and sometimes feels like there is something under his right breast.     66 yr old male with HIV, managed by ID Dr. [name]. .with  HAART with Jorje Guild and Prezisat,  history of of productive cough, evaluated in June,  chest x ray noted  LLL pneumonia , treated and resolved,  with repeat x ray in early August showing resolution presents with recurrence of cough that started 3 weeks ago .   States that symptoms returned 3 weeks ago,  with chest heaviness, cough productive of brown sputum.  Denies.fevers.  only Short of breath with treadmill,  but not with ADL's.   Feels like he wheezes at night ..  Using the albuterol MDI occasionally,  no change.     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  No taking a statin.  HAART meds not particularly toxic to muscles.  He does not do any other exercise  other than walking slowly on a treadmill , due to leg pain .  Reports periodic pain under right breast, lateral chest wall  with movement of arm .  Left upper arm hurts with raising arm above head and with stretching. In the morning .  Not persistent.    RLS   taking requip.  Reviewed recent neurology visit with Dr Manuella Ghazi   Outpatient Medications Prior to Visit  Medication Sig Dispense Refill   acetaminophen (TYLENOL) 500 MG tablet Take 1,000 mg by mouth every 6 (six) hours as needed for moderate pain or headache.     albuterol (VENTOLIN HFA) 108 (90 Base) MCG/ACT inhaler Inhale 2 puffs into the lungs every 6 (six) hours as needed for wheezing or shortness of breath. 8 g 2   allopurinol (ZYLOPRIM) 100 MG tablet TAKE 1 TABLET BY MOUTH EVERY DAY 90 tablet 3   ALPRAZolam (XANAX) 0.25 MG tablet Take 1 tablet (0.25 mg total) by mouth at bedtime as needed for anxiety. 10 tablet 0   aspirin EC 81 MG tablet Take 1 tablet (81 mg total) by mouth daily. 90 tablet 3   blood glucose meter kit and supplies KIT Dispense based on patient and insurance preference. Use up to four times daily as directed. 1 each 11   Blood Glucose Monitoring Suppl (ACCU-CHEK GUIDE ME) w/Device KIT 1 Device by Does not apply route 2 (two) times daily. 1 kit 0   cholecalciferol (VITAMIN D3) 25 MCG (1000 UNIT) tablet Take 1,000 Units by mouth daily.     darunavir (PREZISTA) 800 MG tablet Take 1 tablet (800 mg total) by mouth daily. 30 tablet 11   elvitegravir-cobicistat-emtricitabine-tenofovir (GENVOYA) 150-150-200-10 MG TABS tablet Take  1 tablet by mouth daily with breakfast. 30 tablet 11   ezetimibe (ZETIA) 10 MG tablet Take 1 tablet (10 mg total) by mouth daily. 90 tablet 3   famotidine (PEPCID) 10 MG tablet Take 10 mg by mouth 2 (two) times daily as needed for heartburn or indigestion.     gabapentin (NEURONTIN) 300 MG capsule TAKE 2 CAPSULES BY MOUTH 3 TIMES DAILY. 180 capsule 5   JARDIANCE 10 MG TABS tablet TAKE 1 TABLET BY MOUTH DAILY BEFORE BREAKFAST. 30 tablet 11   Lancets (ONETOUCH ULTRASOFT) lancets Use as instructed to check glucose BID 100 each 12   loratadine (CLARITIN) 10 MG tablet Take 10 mg by mouth daily as needed for allergies.     rOPINIRole (REQUIP) 1 MG tablet Take 1.5  mg by mouth 4 (four) times daily.     telmisartan (MICARDIS) 40 MG tablet TAKE 1 TABLET BY MOUTH EVERYDAY AT BEDTIME 90 tablet 1   tiZANidine (ZANAFLEX) 4 MG tablet Take 1 tablet (4 mg total) by mouth every 6 (six) hours as needed for muscle spasms. 30 tablet 0   triamcinolone (NASACORT) 55 MCG/ACT AERO nasal inhaler Place 2 sprays into the nose daily. 1 each 3   vitamin B-12 (CYANOCOBALAMIN) 1000 MCG tablet Take 1,000 mcg by mouth daily.     metFORMIN (GLUCOPHAGE) 500 MG tablet TAKE 1 TABLET BY MOUTH IN THE MORNING AND 2 TABLETS BY MOUTH IN THE EVENING 270 tablet 3   No facility-administered medications prior to visit.    Review of Systems;  Patient denies headache, fevers, malaise, unintentional weight loss, skin rash, eye pain, sinus congestion and sinus pain, sore throat, dysphagia,  hemoptysis , cough, dyspnea, wheezing, chest pain, palpitations, orthopnea, edema, abdominal pain, nausea, melena, diarrhea, constipation, flank pain, dysuria, hematuria, urinary  Frequency, nocturia, numbness, tingling, seizures,  Focal weakness, Loss of consciousness,  Tremor, insomnia, depression, anxiety, and suicidal ideation.      Objective:  There were no vitals taken for this visit.  BP Readings from Last 3 Encounters:  08/16/21 112/70  08/08/21 112/72  07/05/21 130/80    Wt Readings from Last 3 Encounters:  08/16/21 232 lb (105.2 kg)  08/16/21 232 lb 6.4 oz (105.4 kg)  08/08/21 230 lb 3.2 oz (104.4 kg)    General appearance: alert, cooperative and appears stated age Ears: normal TM's and external ear canals both ears Throat: lips, mucosa, and tongue normal; teeth and gums normal Neck: no adenopathy, no carotid bruit, supple, symmetrical, trachea midline and thyroid not enlarged, symmetric, no tenderness/mass/nodules Back: symmetric, no curvature. ROM normal. No CVA tenderness. Lungs: clear to auscultation bilaterally with scattered ronchi,  no wheezing.  Heart: regular rate and rhythm,  S1, S2 normal, no murmur, click, rub or gallop Abdomen: soft, non-tender; bowel sounds normal; no masses,  no organomegaly Pulses: 2+ and symmetric Skin: Skin color, texture, turgor normal. No rashes or lesions Lymph nodes: Cervical, supraclavicular, and axillary nodes normal. Neuro:  awake and interactive with normal mood and affect. Higher cortical functions are normal. Speech is clear without word-finding difficulty or dysarthria. Extraocular movements are intact. Visual fields of both eyes are grossly intact. Sensation to light touch is grossly intact bilaterally of upper and lower extremities. Motor examination shows 4+/5 symmetric hand grip and upper extremity and 5/5 lower extremity strength. There is no pronation or drift. Gait is non-ataxic   Lab Results  Component Value Date   HGBA1C 6.2 07/05/2021   HGBA1C 6.2 02/22/2021   HGBA1C 6.0 11/18/2020  Lab Results  Component Value Date   CREATININE 1.09 07/05/2021   CREATININE 1.06 06/28/2021   CREATININE 1.12 06/21/2021    Lab Results  Component Value Date   WBC 7.5 06/21/2021   HGB 14.7 06/21/2021   HCT 42.0 06/21/2021   PLT 368 06/21/2021   GLUCOSE 90 07/05/2021   CHOL 183 07/05/2021   TRIG 294.0 (H) 07/05/2021   HDL 43.60 07/05/2021   LDLDIRECT 103.0 07/05/2021   LDLCALC 90 06/21/2021   ALT 31 07/05/2021   AST 25 07/05/2021   NA 139 07/05/2021   K 4.1 07/05/2021   CL 103 07/05/2021   CREATININE 1.09 07/05/2021   BUN 13 07/05/2021   CO2 28 07/05/2021   TSH 1.06 04/29/2020   PSA 0.45 04/29/2020   HGBA1C 6.2 07/05/2021   MICROALBUR 5.6 (H) 02/22/2021    US Venous Img Lower Unilateral Right (DVT)  Result Date: 07/18/2021 CLINICAL DATA:  Knee swelling and numbness EXAM: RIGHT LOWER EXTREMITY VENOUS DOPPLER ULTRASOUND TECHNIQUE: Gray-scale sonography with compression, as well as color and duplex ultrasound, were performed to evaluate the deep venous system(s) from the level of the common femoral vein through the  popliteal and proximal calf veins. COMPARISON:  None available FINDINGS: VENOUS Normal compressibility of the common femoral, superficial femoral, and popliteal veins, as well as the visualized calf veins. Visualized portions of profunda femoral vein and great saphenous vein unremarkable. No filling defects to suggest DVT on grayscale or color Doppler imaging. Doppler waveforms show normal direction of venous flow, normal respiratory plasticity and response to augmentation. Limited views of the contralateral common femoral vein are unremarkable. OTHER None. Limitations: none IMPRESSION: 1. No right lower extremity DVT. 2. No Baker's cyst. Electronically Signed   By: Miachel Roux M.D.   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      Chronic kidney disease (CKD), stage II (mild)   Relevant Orders   Comprehensive metabolic panel   Obesity, diabetes, and hypertension syndrome ([facility])   Relevant Orders   Hemoglobin A1c   Lipid panel   Restless legs syndrome    Iron studies were normal in January.  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 No signs of vascultis .  Was not taking pravastatin at the time.        Myopathy    Cause unclear,  Has persisted despite  Discontinuation of statin.   CK is again elevated after finally normalizing .  Muscle biopsy was nonspecific for statin  damage.  Will stop metformin and repeat CK in 3 months        Relevant Orders   CK (Creatine Kinase)   Cough in adult    Recurrent.  History of LLL pneumonia in June.  Repeat chest x ray showed resolution in early August.  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 Chronically elevated CK. His muscle pain dissuades him from exercising.  Encouraged to resume participation in regular exercise to maintain strength and  toning .  Lab Results  Component Value Date   FYBOFBP 102 (H) 06/21/2021   CKMB 2.7 11/26/2011   TROPONINI < 0.02 11/26/2011         Other Visit Diagnoses     Productive cough    -  Primary   Relevant Orders   DG Chest 2 View       I spent a total of  45 minutes with this patient in a face to face visit on the date of this encounter reviewing the last office visit with me in  July, his most recent visit with Neurology and INfectious Disease,   patient's diet and exercise habits, most  recent  labs and imaging studies ,   and post visit ordering of testing and therapeutics.    Follow-up: Return in about 2 weeks (around 11/03/2021).   [name], MD

## 2021-10-20 NOTE — Patient Instructions (Addendum)
For your allergies ,  You can use Benadryl but you should also consider adding one of these newer second generation antihistamines that are longer acting, non sedating and  available OTC:  Generic  Zyrtec, which is cetirizine.    generic Allegra , available generically as fexofenadine ; comes in 60 mg and 180 mg once daily strengths.    Generic Claritin :  also available as loratidine .     Chest  x ray today . Marland Kitchen I will hold off on prescribing antibiotics until it has been resulted   If you are over 60 ,   the RSV vaccine is recommended.[facility], [facility] and [facility] currently have the vaccine in stock so I have sent an rx to [facility] for you to get the vaccine once you are feeling better from current illness   Regards,   Deborra Medina, MD       October 10  arrive at 10:45 for the 11:00 ECHOCARDIOGRAM AT [facility]

## 2021-10-20 NOTE — Assessment & Plan Note (Addendum)
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 Chronically elevated CK. His muscle pain dissuades him from exercising.  Encouraged to resume participation in regular exercise to maintain strength and toning .  Lab Results  Component Value Date   CKTOTAL 654 (H) 06/21/2021   CKMB 2.7 11/26/2011   TROPONINI < 0.02 11/26/2011

## 2021-10-20 NOTE — Assessment & Plan Note (Signed)
Recurrent.  History of LLL pneumonia in June.  Repeat chest x ray showed resolution in early August.  Given  His HIV status and current 3 week history of productive cough and Occasional wheezing  A Repeat chest x ray is needed to determine use of abx for bronchitis v PNA

## 2021-10-20 NOTE — Assessment & Plan Note (Signed)
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

## 2021-10-20 NOTE — Assessment & Plan Note (Signed)
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 No signs of vascultis .  Was not taking pravastatin at the time.

## 2021-10-21 MED ORDER — PREDNISONE 10 MG PO TABS: ORAL_TABLET | ORAL | 0 refills | Status: DC

## 2021-10-21 MED ORDER — AMOXICILLIN-POT CLAVULANATE 875-125 MG PO TABS: 1.0000 | ORAL_TABLET | Freq: Two times a day (BID) | ORAL | 0 refills | Status: DC

## 2021-10-21 NOTE — Telephone Encounter (Signed)
Pt notified to suspend metformin until next visit.

## 2021-10-21 NOTE — Telephone Encounter (Signed)
Patient returned Adam Becker, CMA's call.  I read message from Dr. Deborra Medina to patient.  Patient states his muscle pain is gone for the most part.  Patient states he has had one little episode about a month ago and he got up and took a spoonful of mustard and it helped.  Patient would like to know if Dr. Derrel Nip would still like for him to stop taking the metformin.

## 2021-11-01 ENCOUNTER — Encounter: Payer: Self-pay | Admitting: Internal Medicine

## 2021-11-01 ENCOUNTER — Ambulatory Visit
Admission: RE | Admit: 2021-11-01 | Discharge: 2021-11-01 | Disposition: A | Discharge status: Home / Self Care | Payer: Medicare HMO | Source: Ambulatory Visit | Attending: Internal Medicine | Admitting: Internal Medicine

## 2021-11-01 DIAGNOSIS — I351 Nonrheumatic aortic (valve) insufficiency: Secondary | ICD-10-CM | POA: Diagnosis not present

## 2021-11-01 DIAGNOSIS — I10 Essential (primary) hypertension: Secondary | ICD-10-CM | POA: Diagnosis not present

## 2021-11-01 DIAGNOSIS — I358 Other nonrheumatic aortic valve disorders: Secondary | ICD-10-CM

## 2021-11-01 DIAGNOSIS — I350 Nonrheumatic aortic (valve) stenosis: Secondary | ICD-10-CM | POA: Insufficient documentation

## 2021-11-01 DIAGNOSIS — E785 Hyperlipidemia, unspecified: Secondary | ICD-10-CM | POA: Insufficient documentation

## 2021-11-01 DIAGNOSIS — I082 Rheumatic disorders of both aortic and tricuspid valves: Secondary | ICD-10-CM | POA: Diagnosis not present

## 2021-11-01 DIAGNOSIS — E119 Type 2 diabetes mellitus without complications: Secondary | ICD-10-CM | POA: Insufficient documentation

## 2021-11-01 DIAGNOSIS — I359 Nonrheumatic aortic valve disorder, unspecified: Secondary | ICD-10-CM | POA: Diagnosis present

## 2021-11-01 LAB — ECHOCARDIOGRAM COMPLETE
AR max vel: 1.58 cm2
AV Area VTI: 1.55 cm2
AV Area mean vel: 1.55 cm2
AV Mean grad: 13 mmHg
AV Peak grad: 22.6 mmHg
Ao pk vel: 2.38 m/s
Area-P 1/2: 3.6 cm2
P 1/2 time: 640 msec
S' Lateral: 3.3 cm

## 2021-11-01 MED ORDER — PERFLUTREN LIPID MICROSPHERE
1.0000 mL | INTRAVENOUS | Status: AC | PRN
  Administered 2021-11-01: 3 mL via INTRAVENOUS
  Filled 2021-11-01: qty 10

## 2021-11-28 ENCOUNTER — Telehealth: Payer: Self-pay

## 2021-11-28 NOTE — Telephone Encounter (Signed)
Pt c/o having dizzy spells & lightheadedness ongoing for the past couple wks. Has had 4 episodes w/one occasion almost passing out with him bringing himself down to the floor slowly. Also c/o weakness, pt denied access nurse eval but pt has been sch for appt tomorrow 11/29/21 w/PCP '@11am'$  for eval. Instructed pt to cont to monitor himself throughout the day & if any sx's worsen to call 911 or head over to the ED for further eval. Pt verbalized understanding to info & agreed w/plan.

## 2021-11-28 NOTE — Telephone Encounter (Signed)
FYI

## 2021-11-29 ENCOUNTER — Ambulatory Visit (INDEPENDENT_AMBULATORY_CARE_PROVIDER_SITE_OTHER): Payer: Medicare HMO | Admitting: Internal Medicine

## 2021-11-29 ENCOUNTER — Encounter: Payer: Self-pay | Admitting: Internal Medicine

## 2021-11-29 VITALS — BP 136/88 | HR 73 | Temp 97.8°F | Ht 71.0 in | Wt 234.0 lb

## 2021-11-29 DIAGNOSIS — N182 Chronic kidney disease, stage 2 (mild): Secondary | ICD-10-CM | POA: Diagnosis not present

## 2021-11-29 DIAGNOSIS — E669 Obesity, unspecified: Secondary | ICD-10-CM

## 2021-11-29 DIAGNOSIS — E1169 Type 2 diabetes mellitus with other specified complication: Secondary | ICD-10-CM

## 2021-11-29 DIAGNOSIS — I152 Hypertension secondary to endocrine disorders: Secondary | ICD-10-CM

## 2021-11-29 DIAGNOSIS — I350 Nonrheumatic aortic (valve) stenosis: Secondary | ICD-10-CM

## 2021-11-29 DIAGNOSIS — R55 Syncope and collapse: Secondary | ICD-10-CM

## 2021-11-29 DIAGNOSIS — R42 Dizziness and giddiness: Secondary | ICD-10-CM | POA: Diagnosis not present

## 2021-11-29 DIAGNOSIS — E1159 Type 2 diabetes mellitus with other circulatory complications: Secondary | ICD-10-CM | POA: Diagnosis not present

## 2021-11-29 DIAGNOSIS — E7800 Pure hypercholesterolemia, unspecified: Secondary | ICD-10-CM

## 2021-11-29 LAB — CBC WITH DIFFERENTIAL/PLATELET
Basophils Absolute: 0 10*3/uL (ref 0.0–0.1)
Basophils Relative: 0.5 % (ref 0.0–3.0)
Eosinophils Absolute: 0.4 10*3/uL (ref 0.0–0.7)
Eosinophils Relative: 7 % — ABNORMAL HIGH (ref 0.0–5.0)
HCT: 43.1 % (ref 39.0–52.0)
Hemoglobin: 14.5 g/dL (ref 13.0–17.0)
Lymphocytes Relative: 58.9 % — ABNORMAL HIGH (ref 12.0–46.0)
Lymphs Abs: 3.5 10*3/uL (ref 0.7–4.0)
MCHC: 33.6 g/dL (ref 30.0–36.0)
MCV: 100 fl (ref 78.0–100.0)
Monocytes Absolute: 0.7 10*3/uL (ref 0.1–1.0)
Monocytes Relative: 11.6 % (ref 3.0–12.0)
Neutro Abs: 1.3 10*3/uL — ABNORMAL LOW (ref 1.4–7.7)
Neutrophils Relative %: 22 % — ABNORMAL LOW (ref 43.0–77.0)
Platelets: 321 10*3/uL (ref 150.0–400.0)
RBC: 4.31 Mil/uL (ref 4.22–5.81)
RDW: 14.5 % (ref 11.5–15.5)
WBC: 6 10*3/uL (ref 4.0–10.5)

## 2021-11-29 LAB — LDL CHOLESTEROL, DIRECT: Direct LDL: 129 mg/dL

## 2021-11-29 LAB — COMPREHENSIVE METABOLIC PANEL
ALT: 27 U/L (ref 0–53)
AST: 24 U/L (ref 0–37)
Albumin: 4.2 g/dL (ref 3.5–5.2)
Alkaline Phosphatase: 49 U/L (ref 39–117)
BUN: 12 mg/dL (ref 6–23)
CO2: 29 mEq/L (ref 19–32)
Calcium: 9.3 mg/dL (ref 8.4–10.5)
Chloride: 104 mEq/L (ref 96–112)
Creatinine, Ser: 1.09 mg/dL (ref 0.40–1.50)
GFR: 71.4 mL/min (ref >60.00)
Glucose, Bld: 101 mg/dL — ABNORMAL HIGH (ref 70–99)
Potassium: 4 mEq/L (ref 3.5–5.1)
Sodium: 139 mEq/L (ref 135–145)
Total Bilirubin: 0.3 mg/dL (ref 0.2–1.2)
Total Protein: 7.5 g/dL (ref 6.0–8.3)

## 2021-11-29 LAB — LIPID PANEL
Cholesterol: 197 mg/dL (ref 0–200)
HDL: 44 mg/dL (ref >39.00)
LDL Cholesterol: 116 mg/dL — ABNORMAL HIGH (ref 0–99)
NonHDL: 153.24
Total CHOL/HDL Ratio: 4
Triglycerides: 185 mg/dL — ABNORMAL HIGH (ref 0.0–149.0)
VLDL: 37 mg/dL (ref 0.0–40.0)

## 2021-11-29 LAB — HEMOGLOBIN A1C: Hgb A1c MFr Bld: 6.8 % — ABNORMAL HIGH (ref 4.6–6.5)

## 2021-11-29 NOTE — Assessment & Plan Note (Signed)
Found on ECHO during workup for systolic murmur. Referring to Cardiology given new onset presyncope with exertion

## 2021-11-29 NOTE — Progress Notes (Signed)
 Subjective:  Patient ID: Adam Becker, male    DOB: 09/01/1955  Age: 66 y.o. MRN: [id number]  CC: The primary encounter diagnosis was Pure hypercholesterolemia. Diagnoses of Obesity, diabetes, and hypertension syndrome (HCC), Chronic kidney disease (CKD), stage II (mild), Aortic valve stenosis, etiology of cardiac valve disease unspecified, Postural dizziness with presyncope, and Aortic stenosis, moderate were also pertinent to this visit.   HPI [name] presents for recurrent episodes of presyncope  Chief Complaint  Patient presents with   Dizziness    Pt stated that 2 weeks ago he had an episode of feeling lightheaded. Pt stated that is was helping a family member outside saling some merchandise when he went to put money in the register he felt himself get really lightheaded like he was going to pass out, so he slide himself down to sit on a tote. He drank a bottle of water and felt better about 10 minutes later. Since then he has two more episodes but felt them coming on so he went ahead and sat down. He also stated that lately he has felt like his     Adam Becker is a 66 yr old male with diabetic neuropathy, hypertension, newly discovered aortic stenosis by 2D TTE , mild OSA, RLS managed with requip, and HIV who presents after having 3 episodes  of feeling presyncopal.  All episodes occurred while standing and were not accompanied by nausea or abdominal pain nd not precipitated by position change.  Symptoms limited to feeling light headed, head felt tingly ,  vision became slightly blurry,  but not double vision  , no blackening , no vertigo, just felt weak.  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  He was not fasting.   Symptoms resolved after ten minutes with oral intake of water and  change in position.  2n and 3rd episodes occurred while walking.  There was nor precedent temperature change. He has also been  noting loss of balance when bending over.  He sees dr shah for neuropathy and takes gabapentin  dose is 600 mg thee times daily    He does not exercise regularly,  but noted that recently after walking 20 minutes on the treadmill  he started feeling light headed and had to stop.    Orthostatic BPs today were normal/unchanged pulse and systolic readings.         Outpatient Medications Prior to Visit  Medication Sig Dispense Refill   acetaminophen (TYLENOL) 500 MG tablet Take 1,000 mg by mouth every 6 (six) hours as needed for moderate pain or headache.     albuterol (VENTOLIN HFA) 108 (90 Base) MCG/ACT inhaler Inhale 2 puffs into the lungs every 6 (six) hours as needed for wheezing or shortness of breath. 8 g 2   allopurinol (ZYLOPRIM) 100 MG tablet TAKE 1 TABLET BY MOUTH EVERY DAY 90 tablet 3   ALPRAZolam (XANAX) 0.25 MG tablet Take 1 tablet (0.25 mg total) by mouth at bedtime as needed for anxiety. 10 tablet 0   aspirin EC 81 MG tablet Take 1 tablet (81 mg total) by mouth daily. 90 tablet 3   blood glucose meter kit and supplies KIT Dispense based on patient and insurance preference. Use up to four times daily as directed. 1 each 11   Blood Glucose Monitoring Suppl (ACCU-CHEK GUIDE ME) w/Device KIT 1 Device by Does not apply route 2 (two) times daily. 1   kit 0   cholecalciferol (VITAMIN D3) 25 MCG (1000 UNIT) tablet Take 1,000 Units by mouth daily.     darunavir (PREZISTA) 800 MG tablet Take 1 tablet (800 mg total) by mouth daily. 30 tablet 11   elvitegravir-cobicistat-emtricitabine-tenofovir (GENVOYA) 150-150-200-10 MG TABS tablet Take 1 tablet by mouth daily with breakfast. 30 tablet 11   ezetimibe (ZETIA) 10 MG tablet Take 1 tablet (10 mg total) by mouth daily. 90 tablet 3   famotidine (PEPCID) 10 MG tablet Take 10 mg by mouth 2 (two) times daily as needed for heartburn or indigestion.     gabapentin (NEURONTIN) 300 MG capsule TAKE 2 CAPSULES BY MOUTH 3 TIMES DAILY. 180 capsule 5    JARDIANCE 10 MG TABS tablet TAKE 1 TABLET BY MOUTH DAILY BEFORE BREAKFAST. 30 tablet 11   Lancets (ONETOUCH ULTRASOFT) lancets Use as instructed to check glucose BID 100 each 12   loratadine (CLARITIN) 10 MG tablet Take 10 mg by mouth daily as needed for allergies.     rOPINIRole (REQUIP) 1 MG tablet Take 1.5 mg by mouth 4 (four) times daily.     telmisartan (MICARDIS) 40 MG tablet TAKE 1 TABLET BY MOUTH EVERYDAY AT BEDTIME 90 tablet 1   tiZANidine (ZANAFLEX) 4 MG tablet Take 1 tablet (4 mg total) by mouth every 6 (six) hours as needed for muscle spasms. 30 tablet 0   triamcinolone (NASACORT) 55 MCG/ACT AERO nasal inhaler Place 2 sprays into the nose daily. 1 each 3   vitamin B-12 (CYANOCOBALAMIN) 1000 MCG tablet Take 1,000 mcg by mouth daily.     amoxicillin-clavulanate (AUGMENTIN) 875-125 MG tablet Take 1 tablet by mouth 2 (two) times daily. (Patient not taking: Reported on 11/29/2021) 14 tablet 0   predniSONE (DELTASONE) 10 MG tablet 6 tablets on Day 1 , then reduce by 1 tablet daily until gone (Patient not taking: Reported on 11/29/2021) 21 tablet 0   No facility-administered medications prior to visit.    Review of Systems;  Patient denies headache, fevers, malaise, unintentional weight loss, skin rash, eye pain, sinus congestion and sinus pain, sore throat, dysphagia,  hemoptysis , cough, dyspnea, wheezing, chest pain, palpitations, orthopnea, edema, abdominal pain, nausea, melena, diarrhea, constipation, flank pain, dysuria, hematuria, urinary  Frequency, nocturia, numbness, tingling, seizures,  Focal weakness, Loss of consciousness,  Tremor, insomnia, depression, anxiety, and suicidal ideation.      Objective:  BP 136/88 (BP Location: Left Arm, Patient Position: Sitting, Cuff Size: Large)   Pulse 73   Temp 97.8 F (36.6 C) (Oral)   Ht 5' 11" (1.803 m)   Wt 234 lb (106.1 kg)   SpO2 94%   BMI 32.64 kg/m   BP Readings from Last 3 Encounters:  11/29/21 136/88  08/16/21 112/70   08/08/21 112/72    Wt Readings from Last 3 Encounters:  11/29/21 234 lb (106.1 kg)  08/16/21 232 lb (105.2 kg)  08/16/21 232 lb 6.4 oz (105.4 kg)    General appearance: alert, cooperative and appears stated age Ears: normal TM's and external ear canals both ears Throat: lips, mucosa, and tongue normal; teeth and gums normal Neck: no adenopathy, no carotid bruit, supple, symmetrical, trachea midline and thyroid not enlarged, symmetric, no tenderness/mass/nodules Back: symmetric, no curvature. ROM normal. No CVA tenderness. Lungs: clear to auscultation bilaterally Heart: regular rate and rhythm, S1, S2 normal, no murmur, click, rub or gallop Abdomen: soft, non-tender; bowel sounds normal; no masses,  no organomegaly Pulses: 2+ and symmetric Skin: Skin color, texture, turgor normal.   No rashes or lesions Lymph nodes: Cervical, supraclavicular, and axillary nodes normal. Neuro:  awake and interactive with normal mood and affect. Higher cortical functions are normal. Speech is clear without word-finding difficulty or dysarthria. Extraocular movements are intact. Visual fields of both eyes are grossly intact. Sensation to light touch is grossly intact bilaterally of upper and lower extremities. Motor examination shows 4+/5 symmetric hand grip and upper extremity and 5/5 lower extremity strength. There is no pronation or drift. Gait is non-ataxic   Lab Results  Component Value Date   HGBA1C 6.8 (H) 11/29/2021   HGBA1C 6.2 07/05/2021   HGBA1C 6.2 02/22/2021    Lab Results  Component Value Date   CREATININE 1.09 11/29/2021   CREATININE 1.09 07/05/2021   CREATININE 1.06 06/28/2021    Lab Results  Component Value Date   WBC 6.0 11/29/2021   HGB 14.5 11/29/2021   HCT 43.1 11/29/2021   PLT 321.0 11/29/2021   GLUCOSE 101 (H) 11/29/2021   CHOL 197 11/29/2021   TRIG 185.0 (H) 11/29/2021   HDL 44.00 11/29/2021   LDLDIRECT 129.0 11/29/2021   LDLCALC 116 (H) 11/29/2021   ALT 27  11/29/2021   AST 24 11/29/2021   NA 139 11/29/2021   K 4.0 11/29/2021   CL 104 11/29/2021   CREATININE 1.09 11/29/2021   BUN 12 11/29/2021   CO2 29 11/29/2021   TSH 1.06 04/29/2020   PSA 0.45 04/29/2020   HGBA1C 6.8 (H) 11/29/2021   MICROALBUR 5.6 (H) 02/22/2021    ECHOCARDIOGRAM COMPLETE  Result Date: 11/01/2021    ECHOCARDIOGRAM REPORT   Patient Name:   Brion C Koy Date of Exam: 11/01/2021 Medical Rec #:  [id number]         Height:       71.0 in Accession #:    [id number]        Weight:       232.0 lb Date of Birth:  04/09/1955         BSA:          2.246 m Patient Age:    66 years          BP:           112/70 mmHg Patient Gender: M                 HR:           73 bpm. Exam Location:  [facility] Procedure: 2D Echo, Cardiac Doppler, Color Doppler and Intracardiac            Opacification Agent Indications:     I35.9 Aortic valve disorder  History:         Patient has prior history of Echocardiogram examinations, most                  recent 08/19/2017. Risk Factors:Diabetes, Hypertension and                  Dyslipidemia.  Sonographer:     Jennifer Broe Referring Phys:  [id number] TERESA L [name] Diagnosing Phys: Christopher End MD  Sonographer Comments: Suboptimal apical window. Image acquisition challenging due to patient body habitus. IMPRESSIONS  1. Left ventricular ejection fraction, by estimation, is 50 to 55%. The left ventricle has low normal function. The left ventricle has no regional wall motion abnormalities. There is mild left ventricular hypertrophy. Left ventricular diastolic parameters are consistent with Grade II diastolic dysfunction (pseudonormalization).  2. Right ventricular systolic function is normal. The right ventricular   size is normal. Tricuspid regurgitation signal is inadequate for assessing PA pressure.  3. The mitral valve is normal in structure. Trivial mitral valve regurgitation.  4. The aortic valve is tricuspid. There is moderate calcification of the aortic valve. There  is severe thickening of the aortic valve. Aortic valve regurgitation is mild to moderate. Mild to moderate aortic valve stenosis. Aortic valve area, by VTI measures 1.55 cm. Aortic valve mean gradient measures 13.0 mmHg.  5. There is mild dilatation of the ascending aorta, measuring 36 mm.  6. The inferior vena cava is normal in size with <50% respiratory variability, suggesting right atrial pressure of 8 mmHg. FINDINGS  Left Ventricle: Left ventricular ejection fraction, by estimation, is 50 to 55%. The left ventricle has low normal function. The left ventricle has no regional wall motion abnormalities. Definity contrast agent was given IV to delineate the left ventricular endocardial borders. The left ventricular internal cavity size was normal in size. There is mild left ventricular hypertrophy. Left ventricular diastolic parameters are consistent with Grade II diastolic dysfunction (pseudonormalization). Right Ventricle: The right ventricular size is normal. No increase in right ventricular wall thickness. Right ventricular systolic function is normal. Tricuspid regurgitation signal is inadequate for assessing PA pressure. Left Atrium: Left atrial size was normal in size. Right Atrium: Right atrial size was normal in size. Pericardium: There is no evidence of pericardial effusion. Mitral Valve: The mitral valve is normal in structure. Trivial mitral valve regurgitation. Tricuspid Valve: The tricuspid valve is normal in structure. Tricuspid valve regurgitation is not demonstrated. Aortic Valve: The aortic valve is tricuspid. There is moderate calcification of the aortic valve. There is severe thickening of the aortic valve. Aortic valve regurgitation is mild to moderate. Aortic regurgitation PHT measures 640 msec. Mild to moderate  aortic stenosis is present. Aortic valve mean gradient measures 13.0 mmHg. Aortic valve peak gradient measures 22.6 mmHg. Aortic valve area, by VTI measures 1.55 cm. Pulmonic Valve:  The pulmonic valve was grossly normal. Pulmonic valve regurgitation is mild. Aorta: The aortic root is normal in size and structure. There is mild dilatation of the ascending aorta, measuring 36 mm. Pulmonary Artery: The pulmonary artery is of normal size. Venous: The inferior vena cava is normal in size with less than 50% respiratory variability, suggesting right atrial pressure of 8 mmHg. IAS/Shunts: No atrial level shunt detected by color flow Doppler.  LEFT VENTRICLE PLAX 2D LVIDd:         4.60 cm   Diastology LVIDs:         3.30 cm   LV e' medial:    7.40 cm/s LV PW:         1.20 cm   LV E/e' medial:  9.1 LV IVS:        1.10 cm   LV e' lateral:   10.60 cm/s LVOT diam:     2.20 cm   LV E/e' lateral: 6.3 LV SV:         69 LV SV Index:   31 LVOT Area:     3.80 cm  RIGHT VENTRICLE RV Basal diam:  3.70 cm RV Mid diam:    3.80 cm RV S prime:     13.60 cm/s TAPSE (M-mode): 2.1 cm LEFT ATRIUM             Index        RIGHT ATRIUM           Index LA diam:          3.90 cm 1.74 cm/m   RA Area:     15.10 cm LA Vol (A2C):   49.9 ml 22.22 ml/m  RA Volume:   36.70 ml  16.34 ml/m LA Vol (A4C):   44.9 ml 19.99 ml/m LA Biplane Vol: 48.0 ml 21.37 ml/m  AORTIC VALVE AV Area (Vmax):    1.58 cm AV Area (Vmean):   1.55 cm AV Area (VTI):     1.55 cm AV Vmax:           237.50 cm/s AV Vmean:          161.000 cm/s AV VTI:            0.444 m AV Peak Grad:      22.6 mmHg AV Mean Grad:      13.0 mmHg LVOT Vmax:         99.00 cm/s LVOT Vmean:        65.500 cm/s LVOT VTI:          0.181 m LVOT/AV VTI ratio: 0.41 AI PHT:            640 msec  AORTA Ao Root diam: 3.50 cm MITRAL VALVE MV Area (PHT): 3.60 cm    SHUNTS MV Decel Time: 211 msec    Systemic VTI:  0.18 m MV E velocity: 67.10 cm/s  Systemic Diam: 2.20 cm MV A velocity: 60.30 cm/s MV E/A ratio:  1.11 Christopher End MD Electronically signed by Christopher End MD Signature Date/Time: 11/01/2021/4:38:56 PM    Final     Assessment & Plan:   Problem List Items Addressed This  Visit     Obesity, diabetes, and hypertension syndrome (HCC)   Relevant Orders   Hemoglobin A1c (Completed)   Hyperlipidemia - Primary   Relevant Orders   Direct LDL (Completed)   Chronic kidney disease (CKD), stage II (mild)   Relevant Orders   Comprehensive metabolic panel (Completed)   Aortic stenosis, moderate    Found on ECHO during workup for systolic murmur. Referring to Cardiology given new onset presyncope with exertion       Other Visit Diagnoses     Aortic valve stenosis, etiology of cardiac valve disease unspecified       Relevant Orders   Ambulatory referral to Cardiology   Postural dizziness with presyncope       Relevant Orders   Ambulatory referral to Cardiology   CBC with Differential/Platelet (Completed)       I spent a total of 30  minutes with this patient in a face to face visit on the date of this encounter reviewing the last office visit with me , his   , home blood pressure /blood sugar readings, recent ECHO,   and post visit ordering of testing and therapeutics.    Follow-up: Return in about 4 weeks (around 12/27/2021).   Teresa L [name], MD 

## 2021-11-29 NOTE — Patient Instructions (Signed)
I am referring you to cardiology  because your aortic stenosis may be the cause of your dizzy episodes   Do not let yourself get overheated and dehydrated.   Use G2  to rehydrate if you need to (supplies salt to keep your blood pressure up)

## 2021-12-05 ENCOUNTER — Encounter: Payer: Self-pay | Admitting: Cardiology

## 2021-12-05 ENCOUNTER — Ambulatory Visit: Payer: Medicare HMO | Attending: Cardiology | Admitting: Cardiology

## 2021-12-05 VITALS — BP 161/98 | HR 74 | Ht 71.0 in | Wt 237.4 lb

## 2021-12-05 DIAGNOSIS — I350 Nonrheumatic aortic (valve) stenosis: Secondary | ICD-10-CM

## 2021-12-05 DIAGNOSIS — E782 Mixed hyperlipidemia: Secondary | ICD-10-CM | POA: Diagnosis not present

## 2021-12-05 DIAGNOSIS — I10 Essential (primary) hypertension: Secondary | ICD-10-CM | POA: Diagnosis not present

## 2021-12-05 MED ORDER — BEMPEDOIC ACID 180 MG PO TABS: 1.0000 | ORAL_TABLET | Freq: Every day | ORAL | 11 refills | Status: DC

## 2021-12-05 NOTE — Progress Notes (Signed)
Cardiology Office Note:    Date:  12/05/2021   ID:  YIFAN AUKER, DOB 21-Dec-1955, MRN [id number]  PCP:  [name], MD   [facility] Providers Cardiologist:  None     Referring MD: [name], MD   Chief Complaint  Patient presents with   New Patient (Initial Visit)    Aortic valve stenosis, Dizziness w/presyncope    History of Present Illness:    Adam Becker is a 66 y.o. male with a hx of hypertension, hyperlipidemia, mild to moderate aortic valve stenosis, HIV infection who presents due to aortic valve stenosis.  Had an echo last month due to cardiac murmur.  Ejection fraction was low normal at 50 to 55%, mild to moderate aortic valve stenosis noted on echo.  He takes telmisartan for blood pressure, has been on these for years now.  Blood pressure usually controlled at home with systolics in the 469G.  He denies chest pain or shortness of breath.  History of dizziness, orthostatic vitals in the office today showed no evidence for orthostasis.  Past Medical History:  Diagnosis Date   1st degree AV block    seen on ekg   Asplenia 11/11/2014   Chronic kidney disease 11/2011   admission for ATN cr 11, acidosis   Diabetes mellitus without complication (HCC)    Type II   GERD (gastroesophageal reflux disease)    Gout    Heart murmur    HIV infection ([place]) 1994   Hyperglycemia 04/04/2016   Hyperlipidemia    Hypertension    Impaired fasting glucose 07/27/2015   LLL pneumonia 07/06/2021   Myopathy 06/21/2021   Neuromuscular disorder ([place])    peripheral neuropathy   Neuropathy    Umbilical hernia    Urethritis 02/11/2015    Past Surgical History:  Procedure Laterality Date   COLONOSCOPY     LIPOSUCTION HEAD / NECK  2001   posterior neck   MUSCLE BIOPSY Right 04/01/2021   Procedure: MUSCLE BIOPSY;  Surgeon: Robert Bellow, MD;  Location: [facility] ORS;  Service: General;  Laterality: Right;  gastrocnemius; O.R. to notify pathology of  procedure   SPLENECTOMY  2000   reason unclear "stopped working"    Current Medications: Current Meds  Medication Sig   acetaminophen (TYLENOL) 500 MG tablet Take 1,000 mg by mouth every 6 (six) hours as needed for moderate pain or headache.   allopurinol (ZYLOPRIM) 100 MG tablet TAKE 1 TABLET BY MOUTH EVERY DAY   aspirin EC 81 MG tablet Take 1 tablet (81 mg total) by mouth daily.   Bempedoic Acid 180 MG TABS Take 1 tablet by mouth daily.   blood glucose meter kit and supplies KIT Dispense based on patient and insurance preference. Use up to four times daily as directed.   Blood Glucose Monitoring Suppl (ACCU-CHEK GUIDE ME) w/Device KIT 1 Device by Does not apply route 2 (two) times daily.   cholecalciferol (VITAMIN D3) 25 MCG (1000 UNIT) tablet Take 1,000 Units by mouth daily.   darunavir (PREZISTA) 800 MG tablet Take 1 tablet (800 mg total) by mouth daily.   elvitegravir-cobicistat-emtricitabine-tenofovir (GENVOYA) 150-150-200-10 MG TABS tablet Take 1 tablet by mouth daily with breakfast.   ezetimibe (ZETIA) 10 MG tablet Take 1 tablet (10 mg total) by mouth daily.   famotidine (PEPCID) 10 MG tablet Take 10 mg by mouth 2 (two) times daily as needed for heartburn or indigestion.   gabapentin (NEURONTIN) 300 MG capsule TAKE 2 CAPSULES BY  MOUTH 3 TIMES DAILY.   JARDIANCE 10 MG TABS tablet TAKE 1 TABLET BY MOUTH DAILY BEFORE BREAKFAST.   Lancets (ONETOUCH ULTRASOFT) lancets Use as instructed to check glucose BID   loratadine (CLARITIN) 10 MG tablet Take 10 mg by mouth daily as needed for allergies.   rOPINIRole (REQUIP) 1 MG tablet Take 1.5 mg by mouth 4 (four) times daily.   telmisartan (MICARDIS) 40 MG tablet TAKE 1 TABLET BY MOUTH EVERYDAY AT BEDTIME   triamcinolone (NASACORT) 55 MCG/ACT AERO nasal inhaler Place 2 sprays into the nose daily. (Patient taking differently: Place 2 sprays into the nose as needed.)   vitamin B-12 (CYANOCOBALAMIN) 1000 MCG tablet Take 1,000 mcg by mouth daily.      Allergies:   Pravastatin   Social History   Socioeconomic History   Marital status: Single    Spouse name: Not on file   Number of children: Not on file   Years of education: Not on file   Highest education level: Not on file  Occupational History   Not on file  Tobacco Use   Smoking status: Never   Smokeless tobacco: Never  Vaping Use   Vaping Use: Never used  Substance and Sexual Activity   Alcohol use: No   Drug use: No   Sexual activity: Not Currently    Comment: declined condoms  Other Topics Concern   Not on file  Social History Narrative   Independent baseline. Lives by himself.  Education 12th.  Children none.  Caffeine tea one cup 4 x week.   Social Determinants of Health   Financial Resource Strain: Low Risk  (08/16/2021)   Overall Financial Resource Strain (CARDIA)    Difficulty of Paying Living Expenses: Not hard at all  Food Insecurity: No Food Insecurity (08/16/2021)   Hunger Vital Sign    Worried About Running Out of Food in the Last Year: Never true    Ran Out of Food in the Last Year: Never true  Transportation Needs: No Transportation Needs (08/16/2021)   PRAPARE - Hydrologist (Medical): No    Lack of Transportation (Non-Medical): No  Physical Activity: Sufficiently Active (08/12/2020)   Exercise Vital Sign    Days of Exercise per Week: 7 days    Minutes of Exercise per Session: 30 min  Stress: No Stress Concern Present (08/16/2021)   [place]    Feeling of Stress : Not at all  Social Connections: Unknown (08/16/2021)   Social Connection and Isolation Panel [NHANES]    Frequency of Communication with Friends and Family: More than three times a week    Frequency of Social Gatherings with Friends and Family: More than three times a week    Attends Religious Services: More than 4 times per year    Active Member of [employer] or Organizations: Yes    Attends  Music therapist: More than 4 times per year    Marital Status: Not on file     Family History: The patient's family history includes Birth defects in his son; Breast cancer in his mother; Cancer in his mother; Colon cancer in his father; Colon polyps in his father. There is no history of Rectal cancer or Stomach cancer.  ROS:   Please see the history of present illness.     All other systems reviewed and are negative.  EKGs/Labs/Other Studies Reviewed:    The following studies were reviewed today:  EKG:  EKG is  ordered today.  The ekg ordered today demonstrates sinus rhythm, first-degree AV block, right bundle branch block  Recent Labs: 11/29/2021: ALT 27; BUN 12; Creatinine, Ser 1.09; Hemoglobin 14.5; Platelets 321.0; Potassium 4.0; Sodium 139  Recent Lipid Panel    Component Value Date/Time   CHOL 197 11/29/2021 1201   TRIG 185.0 (H) 11/29/2021 1201   HDL 44.00 11/29/2021 1201   CHOLHDL 4 11/29/2021 1201   VLDL 37.0 11/29/2021 1201   LDLCALC 116 (H) 11/29/2021 1201   LDLCALC 90 06/21/2021 1623   LDLDIRECT 129.0 11/29/2021 1201     Risk Assessment/Calculations:     HYPERTENSION CONTROL Vitals:   12/05/21 1426 12/05/21 1427  BP: (!) 151/94 (!) 161/98    The patient's blood pressure is elevated above target today.  In order to address the patient's elevated BP: Blood pressure will be monitored at home to determine if medication changes need to be made.         Physical Exam:    VS:  BP (!) 161/98 (BP Location: Right Arm, Patient Position: Sitting)   Pulse 74   Ht _0  (1.803 m)   Wt 237 lb 6.4 oz (107.7 kg)   SpO2 95%   BMI 33.11 kg/m     Wt Readings from Last 3 Encounters:  12/05/21 237 lb 6.4 oz (107.7 kg)  11/29/21 234 lb (106.1 kg)  08/16/21 232 lb (105.2 kg)     GEN:  Well nourished, well developed in no acute distress HEENT: Normal NECK: No JVD; No carotid bruits CARDIAC: RRR, no murmurs, rubs, gallops RESPIRATORY:  Clear to  auscultation without rales, wheezing or rhonchi  ABDOMEN: Soft, non-tender, non-distended MUSCULOSKELETAL:  No edema; No deformity  SKIN: Warm and dry NEUROLOGIC:  Alert and oriented x 3 PSYCHIATRIC:  Normal affect   ASSESSMENT:    1. Aortic valve stenosis, etiology of cardiac valve disease unspecified   2. Primary hypertension   3. Mixed hyperlipidemia    PLAN:    In order of problems listed above:  Mild to moderate aortic valve stenosis on recent echo.  EF 50 to 55%.  Plan serial monitoring with echocardiogram in 1 year. Hypertension, BP elevated today, usually controlled, continue telmisartan. Hyperlipidemia, continue Zetia.  Start bempedoic acid.  Repeat lipid panel in 4 months.  History of rhabdomyolysis with statins.    Follow-up in 1 year after echocardiogram.       Medication Adjustments/Labs and Tests Ordered: Current medicines are reviewed at length with the patient today.  Concerns regarding medicines are outlined above.  Orders Placed This Encounter  Procedures   Lipid panel   EKG 12-Lead   ECHOCARDIOGRAM COMPLETE   Meds ordered this encounter  Medications   Bempedoic Acid 180 MG TABS    Sig: Take 1 tablet by mouth daily.    Dispense:  30 tablet    Refill:  11    Patient Instructions  Medication Instructions:   Your physician has recommended you make the following change in your medication:    START taking Bempedoic Acid 180 MG once a day.  *If you need a refill on your cardiac medications before your next appointment, please call your pharmacy*   Lab Work:  Your physician recommends that you return for a FASTING lipid profile:  IN 4 Months  - You will need to be fasting. Please do not have anything to eat or drink after midnight the morning you have the lab work. You may only have  water or black coffee with no cream or sugar.   - Please go to the [facility]. You will check in at the front desk to the right as you walk into the atrium. Valet  Parking is offered if needed. - No appointment needed. You may go any day between 7 am and 6 pm.     Testing/Procedures:  Your physician has requested that you have an echocardiogram in 1 year. Echocardiography is a painless test that uses sound waves to create images of your heart. It provides your doctor with information about the size and shape of your heart and how well your heart's chambers and valves are working. This procedure takes approximately one hour. There are no restrictions for this procedure. Please do NOT wear cologne, perfume, aftershave, or lotions (deodorant is allowed). Please arrive 15 minutes prior to your appointment time.    Follow-Up: At [facility], you and your health needs are our priority.  As part of our continuing mission to provide you with exceptional heart care, we have created designated Provider Care Teams.  These Care Teams include your primary Cardiologist (physician) and Advanced Practice Providers (APPs -  Physician Assistants and Nurse Practitioners) who all work together to provide you with the care you need, when you need it.  We recommend signing up for the patient portal called "MyChart".  Sign up information is provided on this After Visit Summary.  MyChart is used to connect with patients for Virtual Visits (Telemedicine).  Patients are able to view lab/test results, encounter notes, upcoming appointments, etc.  Non-urgent messages can be sent to your provider as well.   To learn more about what you can do with MyChart, go to [email or web address].    Your next appointment:   Follow up 1 year after Echo    The format for your next appointment:   In Person  Provider:   Kate Sable, MD    Other Instructions   Important Information About Sugar         Signed, Kate Sable, MD  12/05/2021 3:16 PM    [place]

## 2021-12-05 NOTE — Patient Instructions (Signed)
Medication Instructions:   Your physician has recommended you make the following change in your medication:    START taking Bempedoic Acid 180 MG once a day.  *If you need a refill on your cardiac medications before your next appointment, please call your pharmacy*   Lab Work:  Your physician recommends that you return for a FASTING lipid profile:  IN 4 Months  - You will need to be fasting. Please do not have anything to eat or drink after midnight the morning you have the lab work. You may only have water or black coffee with no cream or sugar.   - Please go to the [facility]. You will check in at the front desk to the right as you walk into the atrium. Valet Parking is offered if needed. - No appointment needed. You may go any day between 7 am and 6 pm.     Testing/Procedures:  Your physician has requested that you have an echocardiogram in 1 year. Echocardiography is a painless test that uses sound waves to create images of your heart. It provides your doctor with information about the size and shape of your heart and how well your heart's chambers and valves are working. This procedure takes approximately one hour. There are no restrictions for this procedure. Please do NOT wear cologne, perfume, aftershave, or lotions (deodorant is allowed). Please arrive 15 minutes prior to your appointment time.    Follow-Up: At [facility], you and your health needs are our priority.  As part of our continuing mission to provide you with exceptional heart care, we have created designated Provider Care Teams.  These Care Teams include your primary Cardiologist (physician) and Advanced Practice Providers (APPs -  Physician Assistants and Nurse Practitioners) who all work together to provide you with the care you need, when you need it.  We recommend signing up for the patient portal called "MyChart".  Sign up information is provided on this After Visit Summary.  MyChart is used  to connect with patients for Virtual Visits (Telemedicine).  Patients are able to view lab/test results, encounter notes, upcoming appointments, etc.  Non-urgent messages can be sent to your provider as well.   To learn more about what you can do with MyChart, go to [email or web address].    Your next appointment:   Follow up 1 year after Echo    The format for your next appointment:   In Person  Provider:   Kate Sable, MD    Other Instructions   Important Information About Sugar

## 2021-12-12 ENCOUNTER — Encounter: Payer: Self-pay | Admitting: Emergency Medicine

## 2021-12-12 ENCOUNTER — Other Ambulatory Visit: Payer: Self-pay

## 2021-12-12 ENCOUNTER — Emergency Department: Payer: Medicare HMO

## 2021-12-12 ENCOUNTER — Telehealth: Payer: Self-pay

## 2021-12-12 ENCOUNTER — Emergency Department
Admission: EM | Admit: 2021-12-12 | Discharge: 2021-12-12 | Disposition: A | Discharge status: Home / Self Care | Payer: Medicare HMO | Attending: Emergency Medicine | Admitting: Emergency Medicine

## 2021-12-12 DIAGNOSIS — R42 Dizziness and giddiness: Secondary | ICD-10-CM | POA: Insufficient documentation

## 2021-12-12 DIAGNOSIS — Z8673 Personal history of transient ischemic attack (TIA), and cerebral infarction without residual deficits: Secondary | ICD-10-CM | POA: Diagnosis not present

## 2021-12-12 DIAGNOSIS — I10 Essential (primary) hypertension: Secondary | ICD-10-CM | POA: Diagnosis not present

## 2021-12-12 DIAGNOSIS — E119 Type 2 diabetes mellitus without complications: Secondary | ICD-10-CM | POA: Diagnosis not present

## 2021-12-12 DIAGNOSIS — R011 Cardiac murmur, unspecified: Secondary | ICD-10-CM | POA: Diagnosis not present

## 2021-12-12 LAB — CBC
HCT: 44.8 % (ref 39.0–52.0)
Hemoglobin: 15.3 g/dL (ref 13.0–17.0)
MCH: 32.6 pg (ref 26.0–34.0)
MCHC: 34.2 g/dL (ref 30.0–36.0)
MCV: 95.3 fL (ref 80.0–100.0)
Platelets: 371 10*3/uL (ref 150–400)
RBC: 4.7 MIL/uL (ref 4.22–5.81)
RDW: 14.9 % (ref 11.5–15.5)
WBC: 8.1 10*3/uL (ref 4.0–10.5)
nRBC: 0 % (ref 0.0–0.2)

## 2021-12-12 LAB — BASIC METABOLIC PANEL
Anion gap: 8 (ref 5–15)
BUN: 15 mg/dL (ref 8–23)
CO2: 26 mmol/L (ref 22–32)
Calcium: 9.2 mg/dL (ref 8.9–10.3)
Chloride: 106 mmol/L (ref 98–111)
Creatinine, Ser: 1.12 mg/dL (ref 0.61–1.24)
GFR, Estimated: >60 mL/min (ref >60)
Glucose, Bld: 101 mg/dL — ABNORMAL HIGH (ref 70–99)
Potassium: 3.9 mmol/L (ref 3.5–5.1)
Sodium: 140 mmol/L (ref 135–145)

## 2021-12-12 NOTE — ED Provider Notes (Signed)
[facility] Provider Note    Event Date/Time   First MD Initiated Contact with Patient 12/12/21 1453     (approximate)   History   Dizziness   HPI  Adam Becker is a 66 y.o. male with a history of aortic valve stenosis, diabetes, hypertension who presents with complaints of dizziness.  Patient reports over the last month he has had 3-5 dizziness episodes.  He typically can feel them coming on and they improve when he sits down.  He denies chest pain, does not think that he is having palpitations.  No shortness of breath.  Feels well currently.  No neurodeficits.     Physical Exam   Triage Vital Signs: ED Triage Vitals  Enc Vitals Group     BP 12/12/21 1314 (!) 162/87     Pulse Rate 12/12/21 1314 72     Resp 12/12/21 1314 19     Temp 12/12/21 1314 98.1 F (36.7 C)     Temp Source 12/12/21 1314 Oral     SpO2 12/12/21 1314 98 %     Weight 12/12/21 1314 107.5 kg (237 lb)     Height 12/12/21 1504 1.803 m ('5\' 11"'$ )     Head Circumference --      Peak Flow --      Pain Score 12/12/21 1314 0     Pain Loc --      Pain Edu? --      Excl. in [place]? --     Most recent vital signs: Vitals:   12/12/21 1314 12/12/21 1529  BP: (!) 162/87 (!) 158/80  Pulse: 72 70  Resp: 19 18  Temp: 98.1 F (36.7 C)   SpO2: 98% 98%     General: Awake, no distress.  CV:  Good peripheral perfusion.  Positive systolic murmur 3 out of 6 Resp:  Normal effort.  Abd:  No distention.  Other:  No lower extremity swelling   ED Results / Procedures / Treatments   Labs (all labs ordered are listed, but only abnormal results are displayed) Labs Reviewed  BASIC METABOLIC PANEL - Abnormal; Notable for the following components:      Result Value   Glucose, Bld 101 (*)    All other components within normal limits  CBC     EKG  ED ECG REPORT I, Lavonia Drafts, the attending physician, personally viewed and interpreted this ECG.  Date: 12/12/2021  Rhythm: normal  sinus rhythm QRS Axis: Left axis deviation Intervals: normal ST/T Wave abnormalities: normal Narrative Interpretation: no evidence of acute ischemia    RADIOLOGY CT head viewed interpreted by me, no acute abnormality, pending radiology read    PROCEDURES:  Critical Care performed:   Procedures   MEDICATIONS ORDERED IN ED: Medications - No data to display   IMPRESSION / MDM / [place] / ED COURSE  I reviewed the triage vital signs and the nursing notes. Patient's presentation is most consistent with acute presentation with potential threat to life or bodily function.  Patient presents with episodes of dizziness as detailed above.  Differential is extensive including electrolyte normalities, dehydration, vasovagal, aortic stenosis, arrhythmia  Patient reports he was seen by cardiology for aortic stenosis, verified via review of medical records, he has seen Dr. Garen Lah  Lab work here today is quite reassuring he is asymptomatic.  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  Currently urged to repeat follow-up with cardiology for possible Holter monitoring further evaluation of  lightheadedness, considered admission however patient is asymptomatic, appropriate for discharge at this time        FINAL CLINICAL IMPRESSION(S) / ED DIAGNOSES   Final diagnoses:  Dizziness     Rx / DC Orders   ED Discharge Orders          Ordered    Ambulatory referral to Cardiology       Comments: If you have not heard from the Cardiology office within the next 72 hours please call [phone].   12/12/21 1513             Note:  This document was prepared using Dragon voice recognition software and may include unintentional dictation errors.   Lavonia Drafts, MD 12/12/21 2117

## 2021-12-12 NOTE — ED Provider Triage Note (Signed)
Emergency Medicine Provider Triage Evaluation Note  DAXEN LANUM , a 66 y.o. male  was evaluated in triage.  Pt complains of dizziness for 2-3 weeks.  Saw his PCP and a cardiologist who said they did not know what was wrong.  States dizzy when he gets hot and when he goes to stand up from a sitting position.  Does not get dizzy with movement of the head.  Review of Systems  Positive:  Negative:   Physical Exam  BP (!) 162/87 (BP Location: Left Arm)   Pulse 72   Temp 98.1 F (36.7 C) (Oral)   Resp 19   Wt 107.5 kg   SpO2 98%   BMI 33.05 kg/m  Gen:   Awake, no distress   Resp:  Normal effort  MSK:   Moves extremities without difficulty  Other:    Medical Decision Making  Medically screening exam initiated at 1:18 PM.  Appropriate orders placed.  Yeriel Mineo Olguin was informed that the remainder of the evaluation will be completed by another provider, this initial triage assessment does not replace that evaluation, and the importance of remaining in the ED until their evaluation is complete.     Versie Starks, PA-C 12/12/21 1321

## 2021-12-12 NOTE — Telephone Encounter (Signed)
I transferred call to Access Nurse.

## 2021-12-12 NOTE — ED Triage Notes (Signed)
Pt sts that he has been having dizziness and warm spells that have been happening for the last several weeks. Pt sts that his PCP sent him to a cardiologist but nothing was found.

## 2021-12-12 NOTE — Telephone Encounter (Signed)
Triage nurse advised pt to go to the ED now. Pt stated that he is not going to go right now but will go around noon.

## 2021-12-12 NOTE — Telephone Encounter (Signed)
Patient is experiencing light-headedness, high blood pressure, vision a little blurry, sinus congestion/runny nose, slight headache, blood sugar has been going up and down for the last week or so.  Patient states his blood sugar was up yesterday.  Patient states he had heart palpitations yesterday.

## 2021-12-16 ENCOUNTER — Other Ambulatory Visit: Payer: Self-pay | Admitting: Internal Medicine

## 2021-12-17 DIAGNOSIS — I10 Essential (primary) hypertension: Secondary | ICD-10-CM | POA: Insufficient documentation

## 2021-12-17 DIAGNOSIS — Z45018 Encounter for adjustment and management of other part of cardiac pacemaker: Secondary | ICD-10-CM | POA: Diagnosis not present

## 2021-12-17 DIAGNOSIS — I5022 Chronic systolic (congestive) heart failure: Secondary | ICD-10-CM | POA: Diagnosis not present

## 2021-12-17 DIAGNOSIS — I499 Cardiac arrhythmia, unspecified: Secondary | ICD-10-CM | POA: Diagnosis not present

## 2021-12-17 DIAGNOSIS — M792 Neuralgia and neuritis, unspecified: Secondary | ICD-10-CM | POA: Diagnosis not present

## 2021-12-17 DIAGNOSIS — J952 Acute pulmonary insufficiency following nonthoracic surgery: Secondary | ICD-10-CM | POA: Diagnosis not present

## 2021-12-17 DIAGNOSIS — I959 Hypotension, unspecified: Secondary | ICD-10-CM | POA: Diagnosis not present

## 2021-12-17 DIAGNOSIS — E119 Type 2 diabetes mellitus without complications: Secondary | ICD-10-CM | POA: Diagnosis not present

## 2021-12-17 DIAGNOSIS — G4733 Obstructive sleep apnea (adult) (pediatric): Secondary | ICD-10-CM | POA: Diagnosis not present

## 2021-12-17 DIAGNOSIS — N189 Chronic kidney disease, unspecified: Secondary | ICD-10-CM | POA: Diagnosis not present

## 2021-12-17 DIAGNOSIS — I442 Atrioventricular block, complete: Secondary | ICD-10-CM | POA: Diagnosis not present

## 2021-12-17 DIAGNOSIS — M109 Gout, unspecified: Secondary | ICD-10-CM | POA: Insufficient documentation

## 2021-12-17 DIAGNOSIS — T82120D Displacement of cardiac electrode, subsequent encounter: Secondary | ICD-10-CM | POA: Diagnosis not present

## 2021-12-17 DIAGNOSIS — I452 Bifascicular block: Secondary | ICD-10-CM | POA: Diagnosis not present

## 2021-12-17 DIAGNOSIS — G2581 Restless legs syndrome: Secondary | ICD-10-CM | POA: Diagnosis not present

## 2021-12-17 DIAGNOSIS — E785 Hyperlipidemia, unspecified: Secondary | ICD-10-CM | POA: Diagnosis not present

## 2021-12-17 DIAGNOSIS — R42 Dizziness and giddiness: Secondary | ICD-10-CM | POA: Diagnosis not present

## 2021-12-17 DIAGNOSIS — E669 Obesity, unspecified: Secondary | ICD-10-CM | POA: Diagnosis not present

## 2021-12-17 DIAGNOSIS — E1122 Type 2 diabetes mellitus with diabetic chronic kidney disease: Secondary | ICD-10-CM | POA: Diagnosis not present

## 2021-12-17 DIAGNOSIS — B20 Human immunodeficiency virus [HIV] disease: Secondary | ICD-10-CM | POA: Diagnosis not present

## 2021-12-17 DIAGNOSIS — Z79899 Other long term (current) drug therapy: Secondary | ICD-10-CM | POA: Diagnosis not present

## 2021-12-17 DIAGNOSIS — R55 Syncope and collapse: Secondary | ICD-10-CM | POA: Diagnosis not present

## 2021-12-17 DIAGNOSIS — R079 Chest pain, unspecified: Secondary | ICD-10-CM | POA: Diagnosis not present

## 2021-12-17 DIAGNOSIS — Z950 Presence of cardiac pacemaker: Secondary | ICD-10-CM | POA: Diagnosis not present

## 2021-12-17 DIAGNOSIS — Z21 Asymptomatic human immunodeficiency virus [HIV] infection status: Secondary | ICD-10-CM | POA: Diagnosis not present

## 2021-12-17 DIAGNOSIS — Z452 Encounter for adjustment and management of vascular access device: Secondary | ICD-10-CM | POA: Diagnosis not present

## 2021-12-17 DIAGNOSIS — I440 Atrioventricular block, first degree: Secondary | ICD-10-CM | POA: Diagnosis not present

## 2021-12-17 DIAGNOSIS — I130 Hypertensive heart and chronic kidney disease with heart failure and stage 1 through stage 4 chronic kidney disease, or unspecified chronic kidney disease: Secondary | ICD-10-CM | POA: Diagnosis not present

## 2021-12-18 DIAGNOSIS — G4733 Obstructive sleep apnea (adult) (pediatric): Secondary | ICD-10-CM | POA: Insufficient documentation

## 2021-12-21 ENCOUNTER — Telehealth: Payer: Self-pay

## 2021-12-21 NOTE — Telephone Encounter (Signed)
        Patient  visited [facility] on 11/20   Telephone encounter attempt : 1st   A HIPAA compliant voice message was left requesting a return call.  Instructed patient to call back     [place], [place] [facility]  [phone] [address], [place], [place] [address] Phone: [phone] Email: [email or web address]

## 2022-01-02 DIAGNOSIS — Z950 Presence of cardiac pacemaker: Secondary | ICD-10-CM | POA: Insufficient documentation

## 2022-01-04 ENCOUNTER — Telehealth: Payer: Self-pay

## 2022-01-04 NOTE — Telephone Encounter (Signed)
KEY: [id number] in [email or web address]  This request has been approved.  PA Case: [id number], Status: Approved, Coverage Starts on: 01/23/2021 12:00:00 AM, Coverage Ends on: 01/23/2023 12:00:00 AM. Questions? Contact [phone].

## 2022-01-05 NOTE — Telephone Encounter (Signed)
Prior Authorization for Nexletol 180 mg is approved and good until 01/23/2023.

## 2022-01-05 NOTE — Telephone Encounter (Signed)
Patient is aware of the prior authorization approval for Nexletol 180 mg.

## 2022-02-09 ENCOUNTER — Other Ambulatory Visit: Payer: Self-pay | Admitting: Internal Medicine

## 2022-02-09 ENCOUNTER — Telehealth: Payer: Self-pay | Admitting: Cardiology

## 2022-02-09 MED ORDER — NEXLETOL 180 MG PO TABS: 180.0000 mg | ORAL_TABLET | Freq: Every day | ORAL | 1 refills | Status: DC

## 2022-02-09 NOTE — Telephone Encounter (Signed)
*  STAT* If patient is at the pharmacy, call can be transferred to refill team.   1. Which medications need to be refilled? (please list name of each medication and dose if known) new prescription for Nexletol  2. Which pharmacy/location (including street and city if local pharmacy) is medication to be sent to? [facility]  [place], [place]  3. Do they need a 30 day or 90 day supply? 90 days and refills- please call today- been without it for 3 days

## 2022-02-09 NOTE — Telephone Encounter (Signed)
Pt need refill on Nexletol sent to [facility] in [place]

## 2022-02-09 NOTE — Telephone Encounter (Signed)
Spoke with pt to let him know that he will need to reach out to his cardiologist to have this refilled. Number was given to pt. Pt gave a verbal understanding.

## 2022-02-09 NOTE — Telephone Encounter (Signed)
Spoke with pt to clarify the medication that he was requesting a refill on. He stated that it is a rx that his heart doctor prescribed for him called Nexletol 180 mg. Pt stated that he has been without the medication for 3 days now. He is wanting to know if you could refill this for him. He would like it called in to [facility] in [place] switched.

## 2022-02-16 ENCOUNTER — Telehealth: Payer: Self-pay | Admitting: Internal Medicine

## 2022-02-16 NOTE — Telephone Encounter (Signed)
Pt called in staying that he would like Dr. Derrel Nip know that he's been feeling tightness of chest for  3 days now and when he blows his nose blood comes out, and he was wondering if he has a respiratory infection???

## 2022-02-16 NOTE — Telephone Encounter (Signed)
Lm for pt to cb to sched - openings tomorrow for [employer]

## 2022-02-17 ENCOUNTER — Encounter: Payer: Self-pay | Admitting: Pharmacist

## 2022-02-17 NOTE — Telephone Encounter (Signed)
Lm for pt to cb.

## 2022-02-20 ENCOUNTER — Ambulatory Visit (INDEPENDENT_AMBULATORY_CARE_PROVIDER_SITE_OTHER): Payer: Medicare HMO | Admitting: Internal Medicine

## 2022-02-20 DIAGNOSIS — R040 Epistaxis: Secondary | ICD-10-CM

## 2022-02-20 NOTE — Progress Notes (Signed)
Patient failed to keep scheduled appointment and will be charged a no show fee.   

## 2022-02-20 NOTE — Telephone Encounter (Signed)
Pt is scheduled on today with Dr. Derrel Nip

## 2022-02-20 NOTE — Telephone Encounter (Signed)
Called pt as he was no show for his appt and to check on his symptoms.   Pt stated he actually called to cancel the appt as he feels better. Pt states he believes it was acid reflux and once he started taking something for it he felt better. Pt also stated his nose bleed was not bad was just some spots of blood and is not concerned at this time.   Pt declined rescheduling and stated if his symptoms return or worsen he will reach out.

## 2022-02-23 DIAGNOSIS — E785 Hyperlipidemia, unspecified: Secondary | ICD-10-CM | POA: Diagnosis not present

## 2022-02-23 DIAGNOSIS — E669 Obesity, unspecified: Secondary | ICD-10-CM | POA: Diagnosis not present

## 2022-02-23 DIAGNOSIS — G2581 Restless legs syndrome: Secondary | ICD-10-CM | POA: Diagnosis not present

## 2022-02-23 DIAGNOSIS — E1140 Type 2 diabetes mellitus with diabetic neuropathy, unspecified: Secondary | ICD-10-CM | POA: Diagnosis not present

## 2022-02-23 DIAGNOSIS — Z803 Family history of malignant neoplasm of breast: Secondary | ICD-10-CM | POA: Diagnosis not present

## 2022-02-23 DIAGNOSIS — R69 Illness, unspecified: Secondary | ICD-10-CM | POA: Diagnosis not present

## 2022-02-23 DIAGNOSIS — Z6833 Body mass index (BMI) 33.0-33.9, adult: Secondary | ICD-10-CM | POA: Diagnosis not present

## 2022-02-23 DIAGNOSIS — Z833 Family history of diabetes mellitus: Secondary | ICD-10-CM | POA: Diagnosis not present

## 2022-02-23 DIAGNOSIS — Z950 Presence of cardiac pacemaker: Secondary | ICD-10-CM | POA: Diagnosis not present

## 2022-02-23 DIAGNOSIS — I10 Essential (primary) hypertension: Secondary | ICD-10-CM | POA: Diagnosis not present

## 2022-02-23 DIAGNOSIS — M1990 Unspecified osteoarthritis, unspecified site: Secondary | ICD-10-CM | POA: Diagnosis not present

## 2022-02-23 DIAGNOSIS — Z008 Encounter for other general examination: Secondary | ICD-10-CM | POA: Diagnosis not present

## 2022-03-06 DIAGNOSIS — N189 Chronic kidney disease, unspecified: Secondary | ICD-10-CM | POA: Diagnosis not present

## 2022-03-06 DIAGNOSIS — G4733 Obstructive sleep apnea (adult) (pediatric): Secondary | ICD-10-CM | POA: Diagnosis not present

## 2022-03-06 DIAGNOSIS — R079 Chest pain, unspecified: Secondary | ICD-10-CM | POA: Diagnosis not present

## 2022-03-06 DIAGNOSIS — R5383 Other fatigue: Secondary | ICD-10-CM | POA: Diagnosis not present

## 2022-03-06 DIAGNOSIS — R42 Dizziness and giddiness: Secondary | ICD-10-CM | POA: Diagnosis not present

## 2022-03-06 DIAGNOSIS — I129 Hypertensive chronic kidney disease with stage 1 through stage 4 chronic kidney disease, or unspecified chronic kidney disease: Secondary | ICD-10-CM | POA: Diagnosis not present

## 2022-03-06 DIAGNOSIS — Z21 Asymptomatic human immunodeficiency virus [HIV] infection status: Secondary | ICD-10-CM | POA: Diagnosis not present

## 2022-03-06 DIAGNOSIS — I447 Left bundle-branch block, unspecified: Secondary | ICD-10-CM | POA: Diagnosis not present

## 2022-03-06 DIAGNOSIS — Z79899 Other long term (current) drug therapy: Secondary | ICD-10-CM | POA: Diagnosis not present

## 2022-03-06 DIAGNOSIS — E1122 Type 2 diabetes mellitus with diabetic chronic kidney disease: Secondary | ICD-10-CM | POA: Diagnosis not present

## 2022-03-06 DIAGNOSIS — R9431 Abnormal electrocardiogram [ECG] [EKG]: Secondary | ICD-10-CM | POA: Diagnosis not present

## 2022-03-06 DIAGNOSIS — E785 Hyperlipidemia, unspecified: Secondary | ICD-10-CM | POA: Diagnosis not present

## 2022-03-06 DIAGNOSIS — Z5181 Encounter for therapeutic drug level monitoring: Secondary | ICD-10-CM | POA: Diagnosis not present

## 2022-03-06 DIAGNOSIS — Z950 Presence of cardiac pacemaker: Secondary | ICD-10-CM | POA: Diagnosis not present

## 2022-03-07 ENCOUNTER — Other Ambulatory Visit: Payer: Self-pay | Admitting: Internal Medicine

## 2022-03-13 ENCOUNTER — Telehealth: Payer: Self-pay | Admitting: Cardiology

## 2022-03-13 ENCOUNTER — Telehealth: Payer: Self-pay

## 2022-03-13 DIAGNOSIS — E782 Mixed hyperlipidemia: Secondary | ICD-10-CM

## 2022-03-13 MED ORDER — NEXLETOL 180 MG PO TABS: 180.0000 mg | ORAL_TABLET | Freq: Every day | ORAL | 3 refills | Status: DC

## 2022-03-13 NOTE — Telephone Encounter (Signed)
Approved today Your request has been approved Authorization Expiration Date: 01/23/2023

## 2022-03-13 NOTE — Telephone Encounter (Signed)
Requested Prescriptions   Signed Prescriptions Disp Refills   Bempedoic Acid (NEXLETOL) 180 MG TABS 90 tablet 3    Sig: Take 1 tablet (180 mg total) by mouth daily.    Authorizing Provider: Kate Sable    Ordering User: Britt Bottom

## 2022-03-13 NOTE — Telephone Encounter (Signed)
*  STAT* If patient is at the pharmacy, call can be transferred to refill team.   1. Which medications need to be refilled? (please list name of each medication and dose if known)   Bempedoic Acid (NEXLETOL) 180 MG TABS   2. Which pharmacy/location (including street and city if local pharmacy) is medication to be sent to?  [id number] [place] [facility] [address]   3. Do they need a 30 day or 90 day supply? 90 day  Patient stated [facility] but will need to get this medication as he is now completely out of this medication and [facility].

## 2022-03-13 NOTE — Telephone Encounter (Addendum)
Adam Becker (KeyLevin Erp) PA Case ID #: [id number] [place] [phone]  [facility] - [id number] - [id number]

## 2022-03-20 ENCOUNTER — Other Ambulatory Visit: Payer: Self-pay | Admitting: Internal Medicine

## 2022-03-21 DIAGNOSIS — Z45018 Encounter for adjustment and management of other part of cardiac pacemaker: Secondary | ICD-10-CM | POA: Diagnosis not present

## 2022-03-21 DIAGNOSIS — I442 Atrioventricular block, complete: Secondary | ICD-10-CM | POA: Diagnosis not present

## 2022-03-21 DIAGNOSIS — Z950 Presence of cardiac pacemaker: Secondary | ICD-10-CM | POA: Diagnosis not present

## 2022-04-18 ENCOUNTER — Telehealth: Payer: Self-pay

## 2022-04-18 DIAGNOSIS — E1169 Type 2 diabetes mellitus with other specified complication: Secondary | ICD-10-CM

## 2022-04-18 NOTE — Telephone Encounter (Signed)
Patient states he would like for Dr. Deborra Medina to know that his blood sugar has been running high for the last four days.  Patient states his fasting blood sugar readings have been as follows:  Saturday (04/15/2022 - 174), Sunday (04/16/2022 - 144), Monday (04/17/2022 - 162), Tuesday (04/18/2022 -180), and just now it was 202.  Patient states Dr. Derrel Nip did take him off metformin last year.  Patient would like to know if there is anything he can do.

## 2022-04-19 NOTE — Telephone Encounter (Signed)
LMTCB

## 2022-04-19 NOTE — Telephone Encounter (Signed)
Pt returned Adam Becker CMA call. Pt its booked for 4/23 @4pm  to see provider, and labs on 4/2 @2 :15pm.

## 2022-04-25 ENCOUNTER — Other Ambulatory Visit (INDEPENDENT_AMBULATORY_CARE_PROVIDER_SITE_OTHER): Payer: Medicare HMO

## 2022-04-25 DIAGNOSIS — E669 Obesity, unspecified: Secondary | ICD-10-CM

## 2022-04-25 DIAGNOSIS — E1169 Type 2 diabetes mellitus with other specified complication: Secondary | ICD-10-CM | POA: Diagnosis not present

## 2022-04-25 DIAGNOSIS — E1159 Type 2 diabetes mellitus with other circulatory complications: Secondary | ICD-10-CM | POA: Diagnosis not present

## 2022-04-25 DIAGNOSIS — I152 Hypertension secondary to endocrine disorders: Secondary | ICD-10-CM | POA: Diagnosis not present

## 2022-04-25 LAB — COMPREHENSIVE METABOLIC PANEL
ALT: 40 U/L (ref 0–53)
AST: 40 U/L — ABNORMAL HIGH (ref 0–37)
Albumin: 4.6 g/dL (ref 3.5–5.2)
Alkaline Phosphatase: 51 U/L (ref 39–117)
BUN: 16 mg/dL (ref 6–23)
CO2: 27 mEq/L (ref 19–32)
Calcium: 9.8 mg/dL (ref 8.4–10.5)
Chloride: 102 mEq/L (ref 96–112)
Creatinine, Ser: 1.3 mg/dL (ref 0.40–1.50)
GFR: 57.63 mL/min — ABNORMAL LOW (ref >60.00)
Glucose, Bld: 110 mg/dL — ABNORMAL HIGH (ref 70–99)
Potassium: 4.4 mEq/L (ref 3.5–5.1)
Sodium: 137 mEq/L (ref 135–145)
Total Bilirubin: 0.3 mg/dL (ref 0.2–1.2)
Total Protein: 8.2 g/dL (ref 6.0–8.3)

## 2022-04-25 LAB — LIPID PANEL
Cholesterol: 240 mg/dL — ABNORMAL HIGH (ref 0–200)
HDL: 42.3 mg/dL (ref >39.00)
NonHDL: 198.19
Total CHOL/HDL Ratio: 6
Triglycerides: 323 mg/dL — ABNORMAL HIGH (ref 0.0–149.0)
VLDL: 64.6 mg/dL — ABNORMAL HIGH (ref 0.0–40.0)

## 2022-04-25 LAB — HEMOGLOBIN A1C: Hgb A1c MFr Bld: 7.3 % — ABNORMAL HIGH (ref 4.6–6.5)

## 2022-04-26 LAB — LDL CHOLESTEROL, DIRECT: Direct LDL: 151 mg/dL

## 2022-05-05 ENCOUNTER — Other Ambulatory Visit: Payer: Self-pay | Admitting: Internal Medicine

## 2022-05-12 ENCOUNTER — Other Ambulatory Visit: Payer: Self-pay | Admitting: Infectious Disease

## 2022-05-12 DIAGNOSIS — B20 Human immunodeficiency virus [HIV] disease: Secondary | ICD-10-CM

## 2022-05-12 NOTE — Telephone Encounter (Signed)
Please advise if okay to refill    High Drug-Drug: triamcinolone and GenvoyaPlasma concentrations and pharmacologic effects of triamcinolone may be increased by cobicistat. Hyperglycemia and adrenal suppression with iatrogenic Cushing's Syndrome may occur.   High Drug-Drug: ALPRAZolam and GenvoyaPlasma concentrations and adverse effects (eg, sedation, psychomotor function) alprazolam may be increased by strong CYP3A4 inhibitors (eg, Strong CYP3A4 Inhibitors). Coadministration is contraindicated.

## 2022-05-12 NOTE — Telephone Encounter (Signed)
Please advise 

## 2022-05-12 NOTE — Telephone Encounter (Signed)
Left voicemail asking patient to return my call.

## 2022-05-12 NOTE — Telephone Encounter (Signed)
Okay is alprazolam okay to take or no?

## 2022-05-12 NOTE — Telephone Encounter (Signed)
Yes, it is okay but could cause increased sedation as the alprazolam concentration will be increased because of the Genvoya.

## 2022-05-12 NOTE — Telephone Encounter (Signed)
Hi there!   Patient should not take these medications together, if avoidable. Beclomethasone may be safer, but caution is still warranted.  The reason is because Genvoya increases your exposure to the steroid (triamcinolone) and can cause adrenal insufficiency.

## 2022-05-15 NOTE — Telephone Encounter (Signed)
Attempted to reach patient and LM for patient to call [facility].

## 2022-05-16 ENCOUNTER — Ambulatory Visit (INDEPENDENT_AMBULATORY_CARE_PROVIDER_SITE_OTHER): Payer: Medicare HMO | Admitting: Internal Medicine

## 2022-05-16 ENCOUNTER — Encounter: Payer: Self-pay | Admitting: Internal Medicine

## 2022-05-16 VITALS — BP 136/80 | HR 77 | Temp 97.7°F | Ht 71.0 in | Wt 236.4 lb

## 2022-05-16 DIAGNOSIS — E1169 Type 2 diabetes mellitus with other specified complication: Secondary | ICD-10-CM | POA: Diagnosis not present

## 2022-05-16 DIAGNOSIS — Z8679 Personal history of other diseases of the circulatory system: Secondary | ICD-10-CM | POA: Diagnosis not present

## 2022-05-16 DIAGNOSIS — I129 Hypertensive chronic kidney disease with stage 1 through stage 4 chronic kidney disease, or unspecified chronic kidney disease: Secondary | ICD-10-CM

## 2022-05-16 DIAGNOSIS — I152 Hypertension secondary to endocrine disorders: Secondary | ICD-10-CM | POA: Diagnosis not present

## 2022-05-16 DIAGNOSIS — E1159 Type 2 diabetes mellitus with other circulatory complications: Secondary | ICD-10-CM

## 2022-05-16 DIAGNOSIS — E669 Obesity, unspecified: Secondary | ICD-10-CM | POA: Diagnosis not present

## 2022-05-16 DIAGNOSIS — G629 Polyneuropathy, unspecified: Secondary | ICD-10-CM

## 2022-05-16 DIAGNOSIS — E7800 Pure hypercholesterolemia, unspecified: Secondary | ICD-10-CM

## 2022-05-16 MED ORDER — EZETIMIBE 10 MG PO TABS: 10.0000 mg | ORAL_TABLET | Freq: Every day | ORAL | 3 refills | Status: DC

## 2022-05-16 MED ORDER — METFORMIN HCL ER 500 MG PO TB24: 500.0000 mg | ORAL_TABLET | Freq: Every day | ORAL | 1 refills | Status: DC

## 2022-05-16 MED ORDER — EMPAGLIFLOZIN 10 MG PO TABS: ORAL_TABLET | ORAL | 3 refills | Status: DC

## 2022-05-16 MED ORDER — GABAPENTIN 300 MG PO CAPS: ORAL_CAPSULE | ORAL | 5 refills | Status: DC

## 2022-05-16 NOTE — Assessment & Plan Note (Addendum)
S/p PPM implantation in late November at [facility].  NO SUBSEQUENT SYNCOPAL EPISODES , FOLLOW UP WITH [facility]

## 2022-05-16 NOTE — Assessment & Plan Note (Signed)
Slight loss of   glycemic control since stopping metformin .  Continue Jardiance, resume metformin XR 500 mg daily.  BP is at goal with ARB. Hyperlipidemia now untreated due to biopsy suggested statin myopathy suggested by elevated CK , normal ANA and history of muscle pain , .  Was seen by cardiology and bempozoic acid added to zetia but patient mistakenly stopped zetia. Advised to resume zetia.    Lab Results  Component Value Date   HGBA1C 7.3 (H) 04/25/2022   Lab Results  Component Value Date   MICROALBUR 5.6 (H) 02/22/2021   MICROALBUR 23.4 (H) 04/29/2020

## 2022-05-16 NOTE — Patient Instructions (Signed)
Thank God you were with friends the day you went into heart block! God is great!   Regarding the cholesterol:  resume Zetia.  Take bempedoic acid AND Zetia  Regarding the diabetes and weight gain:  resume metformin 500 mg once daily.  Continue Jardiance   Please return after 3 months ,  with fasting  labs done prior to  (on or after July 3)

## 2022-05-16 NOTE — Progress Notes (Signed)
Subjective:  Patient ID: Adam Becker, male    DOB: 03-11-1955  Age: 67 y.o. MRN: [id number]  CC: The primary encounter diagnosis was Neuropathy. Diagnoses of Obesity, diabetes, and hypertension syndrome, Pure hypercholesterolemia, and History of complete heart block were also pertinent to this visit.   HPI Adam Becker presents for  Chief Complaint  Patient presents with   Medical Management of Chronic Issues   Last seen in November with recurrent exertional presyncope . Referred to cardiology given recent diagnosis of mild to moderate aortic stenosis. Several ore ER evaluatisons for dizziness and presynceop present in 3rd degree heart block in late  [facility] to [facility] and was admitted to ICU . pacemaker implanted.   1) aortic stenosis: asymptomatic since PPM implant    2) left upper biceps pain with internal rotation and raising of arm  ) Type 2 DM:  did not bring BS log.  Most BS < 140 for the past 3 weeks fasting . Had a few  elevated readings a month ago  .  Taking only Jardiance currently,     Outpatient Medications Prior to Visit  Medication Sig Dispense Refill   acetaminophen (TYLENOL) 500 MG tablet Take 1,000 mg by mouth every 6 (six) hours as needed for moderate pain or headache.     allopurinol (ZYLOPRIM) 100 MG tablet TAKE 1 TABLET BY MOUTH EVERY DAY 90 tablet 3   aspirin EC 81 MG tablet Take 1 tablet (81 mg total) by mouth daily. 90 tablet 3   Bempedoic Acid (NEXLETOL) 180 MG TABS Take 1 tablet (180 mg total) by mouth daily. 90 tablet 3   Bempedoic Acid 180 MG TABS Take 1 tablet by mouth daily. 30 tablet 11   blood glucose meter kit and supplies KIT Dispense based on patient and insurance preference. Use up to four times daily as directed. 1 each 11   Blood Glucose Monitoring Suppl (ACCU-CHEK GUIDE ME) w/Device KIT 1 Device by Does not apply route 2 (two) times daily. 1 kit 0   cholecalciferol (VITAMIN D3) 25 MCG (1000 UNIT) tablet Take 1,000 Units by mouth daily.      darunavir (PREZISTA) 800 MG tablet Take 1 tablet (800 mg total) by mouth daily. 30 tablet 11   elvitegravir-cobicistat-emtricitabine-tenofovir (GENVOYA) 150-150-200-10 MG TABS tablet Take 1 tablet by mouth daily with breakfast. 30 tablet 11   famotidine (PEPCID) 10 MG tablet Take 10 mg by mouth 2 (two) times daily as needed for heartburn or indigestion.     Lancets (ONETOUCH ULTRASOFT) lancets Use as instructed to check glucose BID 100 each 12   loratadine (CLARITIN) 10 MG tablet Take 10 mg by mouth daily as needed for allergies.     rOPINIRole (REQUIP) 1 MG tablet Take 1.5 mg by mouth 4 (four) times daily.     telmisartan (MICARDIS) 40 MG tablet TAKE 1 TABLET BY MOUTH EVERYDAY AT BEDTIME 90 tablet 1   tiZANidine (ZANAFLEX) 4 MG tablet Take 1 tablet (4 mg total) by mouth every 6 (six) hours as needed for muscle spasms. 30 tablet 0   triamcinolone (NASACORT) 55 MCG/ACT AERO nasal inhaler Place 2 sprays into the nose daily. (Patient taking differently: Place 2 sprays into the nose as needed.) 1 each 3   vitamin B-12 (CYANOCOBALAMIN) 1000 MCG tablet Take 1,000 mcg by mouth daily.     ezetimibe (ZETIA) 10 MG tablet TAKE 1 TABLET BY MOUTH EVERY DAY 90 tablet 3   gabapentin (NEURONTIN) 300 MG capsule TAKE 2 CAPSULES  BY MOUTH 3 TIMES DAILY. 180 capsule 5   JARDIANCE 10 MG TABS tablet TAKE 1 TABLET BY MOUTH EVERY DAY BEFORE BREAKFAST 90 tablet 3   albuterol (VENTOLIN HFA) 108 (90 Base) MCG/ACT inhaler Inhale 2 puffs into the lungs every 6 (six) hours as needed for wheezing or shortness of breath. (Patient not taking: Reported on 12/05/2021) 8 g 2   ALPRAZolam (XANAX) 0.25 MG tablet Take 1 tablet (0.25 mg total) by mouth at bedtime as needed for anxiety. (Patient not taking: Reported on 12/05/2021) 10 tablet 0   No facility-administered medications prior to visit.    Review of Systems;  Patient denies headache, fevers, malaise, unintentional weight loss, skin rash, eye pain, sinus congestion and sinus  pain, sore throat, dysphagia,  hemoptysis , cough, dyspnea, wheezing, chest pain, palpitations, orthopnea, edema, abdominal pain, nausea, melena, diarrhea, constipation, flank pain, dysuria, hematuria, urinary  Frequency, nocturia, numbness, tingling, seizures,  Focal weakness, Loss of consciousness,  Tremor, insomnia, depression, anxiety, and suicidal ideation.      Objective:  BP 136/80   Pulse 77   Temp 97.7 F (36.5 C) (Oral)   Ht  (1.803 m)   Wt 236 lb 6.4 oz (107.2 kg)   SpO2 95%   BMI 32.97 kg/m   BP Readings from Last 3 Encounters:  05/16/22 136/80  12/12/21 (!) 158/80  12/05/21 (!) 161/98    Wt Readings from Last 3 Encounters:  05/16/22 236 lb 6.4 oz (107.2 kg)  12/12/21 236 lb 15.9 oz (107.5 kg)  12/05/21 237 lb 6.4 oz (107.7 kg)    Physical Exam Vitals reviewed.  Constitutional:      General: He is not in acute distress.    Appearance: Normal appearance. He is normal weight. He is not ill-appearing, toxic-appearing or diaphoretic.  HENT:     Head: Normocephalic.  Eyes:     General: No scleral icterus.       Right eye: No discharge.        Left eye: No discharge.     Conjunctiva/sclera: Conjunctivae normal.  Cardiovascular:     Rate and Rhythm: Normal rate and regular rhythm.     Heart sounds: Normal heart sounds.  Pulmonary:     Effort: Pulmonary effort is normal. No respiratory distress.     Breath sounds: Normal breath sounds.  Musculoskeletal:        General: Normal range of motion.     Cervical back: Normal range of motion.  Skin:    General: Skin is warm and dry.  Neurological:     General: No focal deficit present.     Mental Status: He is alert and oriented to person, place, and time. Mental status is at baseline.  Psychiatric:        Mood and Affect: Mood normal.        Behavior: Behavior normal.        Thought Content: Thought content normal.        Judgment: Judgment normal.    Lab Results  Component Value Date   HGBA1C 7.3 (H)  04/25/2022   HGBA1C 6.8 (H) 11/29/2021   HGBA1C 6.2 07/05/2021    Lab Results  Component Value Date   CREATININE 1.30 04/25/2022   CREATININE 1.12 12/12/2021   CREATININE 1.09 11/29/2021    Lab Results  Component Value Date   WBC 8.1 12/12/2021   HGB 15.3 12/12/2021   HCT 44.8 12/12/2021   PLT 371 12/12/2021   GLUCOSE 110 (H) 04/25/2022  CHOL 240 (H) 04/25/2022   TRIG 323.0 (H) 04/25/2022   HDL 42.30 04/25/2022   LDLDIRECT 151.0 04/25/2022   LDLCALC 116 (H) 11/29/2021   ALT 40 04/25/2022   AST 40 (H) 04/25/2022   NA 137 04/25/2022   K 4.4 04/25/2022   CL 102 04/25/2022   CREATININE 1.30 04/25/2022   BUN 16 04/25/2022   CO2 27 04/25/2022   TSH 1.06 04/29/2020   PSA 0.45 04/29/2020   HGBA1C 7.3 (H) 04/25/2022   MICROALBUR 5.6 (H) 02/22/2021    CT HEAD WO CONTRAST ( )  Result Date: 12/12/2021 CLINICAL DATA:  Vertigo EXAM: CT HEAD WITHOUT CONTRAST TECHNIQUE: Contiguous axial images were obtained from the base of the skull through the vertex without intravenous contrast. RADIATION DOSE REDUCTION: This exam was performed according to the departmental dose-optimization program which includes automated exposure control, adjustment of the mA and/or kV according to patient size and/or use of iterative reconstruction technique. COMPARISON:  MRI Brain 08/18/17, CT brain 11/05/07 FINDINGS: Brain: No evidence of acute infarction, hemorrhage, hydrocephalus, extra-axial collection or mass lesion/mass effect. Chronic right cerebellar infarcts. There is a chronic appearing, but technically age indeterminate infarct in the left caudate head (series 3, image 16). Sequela of mild chronic microvascular ischemic change. Vascular: No hyperdense vessel or unexpected calcification. Skull: Normal. Negative for fracture or focal lesion. Sinuses/Orbits: No acute finding. Other: None. IMPRESSION: 1.  No acute intracranial abnormality. 2. 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 Electronically Signed   By: Lorenza Cambridge M.D.   On: 12/12/2021 14:13    Assessment & Plan:  .Neuropathy -     Gabapentin; TAKE 2 CAPSULES BY MOUTH 3 TIMES DAILY.  Dispense: 180 capsule; Refill: 5  Obesity, diabetes, and hypertension syndrome Assessment & Plan: Slight loss of   glycemic control since stopping metformin .  Continue Jardiance, resume metformin XR 500 mg daily.  BP is at goal with ARB. Hyperlipidemia now untreated due to biopsy suggested statin myopathy suggested by elevated CK , normal ANA and history of muscle pain , .  Was seen by cardiology and bempozoic acid added to zetia but patient mistakenly stopped zetia. Advised to resume zetia.    Lab Results  Component Value Date   HGBA1C 7.3 (H) 04/25/2022   Lab Results  Component Value Date   MICROALBUR 5.6 (H) 02/22/2021   MICROALBUR 23.4 (H) 04/29/2020      Orders: -     Microalbumin / creatinine urine ratio -     Comprehensive metabolic panel; Future -     Hemoglobin A1c; Future  Pure hypercholesterolemia -     Lipid panel; Future -     LDL cholesterol, direct; Future  History of complete heart block Assessment & Plan: S/p PPM implantation in late November at [facility]  NO SUBSEQUENT SYNCOPAL EPISODES , FOLLOW UP WITH [facility]   Other orders -     Empagliflozin; TAKE 1 TABLET BY MOUTH EVERY DAY BEFORE BREAKFAST  Dispense: 90 tablet; Refill: 3 -     metFORMIN HCl ER; Take 1 tablet (500 mg total) by mouth daily with breakfast.  Dispense: 90 tablet; Refill: 1 -     Ezetimibe; Take 1 tablet (10 mg total) by mouth daily.  Dispense: 90 tablet; Refill: 3     I provided of face-to-face time during this encounter reviewing patient's last visit with me, patient's  most recent visit with cardiology,    recent surgical and non surgical procedures, previous  labs and imaging studies, counseling on currently addressed issues,  and post visit ordering to diagnostics and therapeutics .    Follow-up: Return in about 3 months (around 08/15/2022) for follow up diabetes.   Sherlene Shams, MD

## 2022-05-17 ENCOUNTER — Other Ambulatory Visit: Payer: Self-pay | Admitting: Internal Medicine

## 2022-05-17 LAB — MICROALBUMIN / CREATININE URINE RATIO
Creatinine,U: 92.3 mg/dL
Microalb Creat Ratio: 15.7 mg/g (ref 0.0–30.0)
Microalb, Ur: 14.5 mg/dL — ABNORMAL HIGH (ref 0.0–1.9)

## 2022-05-17 MED ORDER — TELMISARTAN 40 MG PO TABS: 80.0000 mg | ORAL_TABLET | Freq: Every day | ORAL | 1 refills | Status: DC

## 2022-05-17 NOTE — Assessment & Plan Note (Signed)
Advised to increase telmisartan to 80 mg daily given the increase in microalbumin.  Lab Results  Component Value Date   MICROALBUR 14.5 (H) 05/16/2022   MICROALBUR 5.6 (H) 02/22/2021

## 2022-05-17 NOTE — Addendum Note (Signed)
Addended by: Ermine Stebbins L on: 05/17/2022 05:03 PM   Modules accepted: Orders  

## 2022-05-17 NOTE — Addendum Note (Signed)
Addended by: Sherlene Shams on: 05/17/2022 05:03 PM   Modules accepted: Orders

## 2022-05-19 NOTE — Telephone Encounter (Signed)
Left voicemail asking patient to return my call.   Bradee Common P August Gosser, CMA  

## 2022-05-24 NOTE — Telephone Encounter (Signed)
Patient returned my call. Patient stated that he is no longer taking triamcinolone nasacort spray - will remove from medication list. Patient takes alprazolam PRN - doesn't take it daily. Patient aware that it is okay to take but it can cause increased sedation and that he shouldn't drive while taking the medication. Patient voiced his understanding.   Druscilla Petsch Lesli Albee, CMA

## 2022-05-24 NOTE — Telephone Encounter (Signed)
Left voicemail asking patient to return my call.

## 2022-06-02 ENCOUNTER — Other Ambulatory Visit: Payer: Self-pay

## 2022-06-02 ENCOUNTER — Emergency Department
Admission: EM | Admit: 2022-06-02 | Discharge: 2022-06-02 | Disposition: A | Discharge status: Home / Self Care | Payer: Medicare HMO | Attending: Student in an Organized Health Care Education/Training Program | Admitting: Student in an Organized Health Care Education/Training Program

## 2022-06-02 ENCOUNTER — Encounter: Payer: Self-pay | Admitting: Emergency Medicine

## 2022-06-02 DIAGNOSIS — B372 Candidiasis of skin and nail: Secondary | ICD-10-CM

## 2022-06-02 DIAGNOSIS — E1165 Type 2 diabetes mellitus with hyperglycemia: Secondary | ICD-10-CM | POA: Insufficient documentation

## 2022-06-02 DIAGNOSIS — L309 Dermatitis, unspecified: Secondary | ICD-10-CM | POA: Diagnosis not present

## 2022-06-02 DIAGNOSIS — R300 Dysuria: Secondary | ICD-10-CM | POA: Diagnosis not present

## 2022-06-02 DIAGNOSIS — R202 Paresthesia of skin: Secondary | ICD-10-CM | POA: Insufficient documentation

## 2022-06-02 LAB — URINALYSIS, ROUTINE W REFLEX MICROSCOPIC
Bacteria, UA: NONE SEEN
Bilirubin Urine: NEGATIVE
Glucose, UA: >=500 mg/dL — AB
Ketones, ur: NEGATIVE mg/dL
Leukocytes,Ua: NEGATIVE
Nitrite: NEGATIVE
Protein, ur: 100 mg/dL — AB
Specific Gravity, Urine: 1.014 (ref 1.005–1.030)
Squamous Epithelial / HPF: NONE SEEN /HPF (ref 0–5)
pH: 6 (ref 5.0–8.0)

## 2022-06-02 LAB — BASIC METABOLIC PANEL
Anion gap: 9 (ref 5–15)
BUN: 16 mg/dL (ref 8–23)
CO2: 25 mmol/L (ref 22–32)
Calcium: 9.3 mg/dL (ref 8.9–10.3)
Chloride: 103 mmol/L (ref 98–111)
Creatinine, Ser: 1.15 mg/dL (ref 0.61–1.24)
GFR, Estimated: >60 mL/min (ref >60)
Glucose, Bld: 138 mg/dL — ABNORMAL HIGH (ref 70–99)
Potassium: 4.2 mmol/L (ref 3.5–5.1)
Sodium: 137 mmol/L (ref 135–145)

## 2022-06-02 LAB — CBC WITH DIFFERENTIAL/PLATELET
Abs Immature Granulocytes: 0.01 10*3/uL (ref 0.00–0.07)
Basophils Absolute: 0.1 10*3/uL (ref 0.0–0.1)
Basophils Relative: 1 %
Eosinophils Absolute: 0.4 10*3/uL (ref 0.0–0.5)
Eosinophils Relative: 7 %
HCT: 46.1 % (ref 39.0–52.0)
Hemoglobin: 15.4 g/dL (ref 13.0–17.0)
Immature Granulocytes: 0 %
Lymphocytes Relative: 48 %
Lymphs Abs: 3.2 10*3/uL (ref 0.7–4.0)
MCH: 32.2 pg (ref 26.0–34.0)
MCHC: 33.4 g/dL (ref 30.0–36.0)
MCV: 96.2 fL (ref 80.0–100.0)
Monocytes Absolute: 0.8 10*3/uL (ref 0.1–1.0)
Monocytes Relative: 12 %
Neutro Abs: 2.1 10*3/uL (ref 1.7–7.7)
Neutrophils Relative %: 32 %
Platelets: 378 10*3/uL (ref 150–400)
RBC: 4.79 MIL/uL (ref 4.22–5.81)
RDW: 14.6 % (ref 11.5–15.5)
WBC: 6.6 10*3/uL (ref 4.0–10.5)
nRBC: 0 % (ref 0.0–0.2)

## 2022-06-02 LAB — CBG MONITORING, ED: Glucose-Capillary: 125 mg/dL — ABNORMAL HIGH (ref 70–99)

## 2022-06-02 MED ORDER — FLUCONAZOLE 150 MG PO TABS: 150.0000 mg | ORAL_TABLET | Freq: Every day | ORAL | 0 refills | Status: DC

## 2022-06-02 NOTE — ED Provider Notes (Signed)
[facility] Provider Note    Event Date/Time   First MD Initiated Contact with Patient 06/02/22 0719     (approximate)   History   Dysuria   HPI  Adam Becker is a 67 y.o. male   presents to the ED with complaint of tingling sensation in his penis after urination.  Patient states that he recently restarted his metformin and currently is taking 1 tablet/day with his blood sugars running between 120 and 130.  Patient states that his PCP told him that he may have a yeast infection.  Patient denies any hematuria, history of kidney stones or previous UTIs.  No penile discharge.      Physical Exam   Triage Vital Signs: ED Triage Vitals  Enc Vitals Group     BP 06/02/22 0331 (!) 151/90     Pulse Rate 06/02/22 0331 77     Resp 06/02/22 0331 18     Temp 06/02/22 0331 98.1 F (36.7 C)     Temp Source 06/02/22 0331 Oral     SpO2 06/02/22 0331 98 %     Weight 06/02/22 0331 230 lb (104.3 kg)     Height 06/02/22 0331 5\' 11"  (1.803 m)     Head Circumference --      Peak Flow --      Pain Score 06/02/22 0330 0     Pain Loc --      Pain Edu? --      Excl. in GC? --     Most recent vital signs: Vitals:   06/02/22 0331 06/02/22 0728  BP: (!) 151/90 131/87  Pulse: 77 72  Resp: 18 18  Temp: 98.1 F (36.7 C) 97.9 F (36.6 C)  SpO2: 98% 94%     General: Awake, no distress.  CV:  Good peripheral perfusion.  Resp:  Normal effort.  Abd:  No distention.  Other:     ED Results / Procedures / Treatments   Labs (all labs ordered are listed, but only abnormal results are displayed) Labs Reviewed  URINALYSIS, ROUTINE W REFLEX MICROSCOPIC - Abnormal; Notable for the following components:      Result Value   Color, Urine YELLOW (*)    APPearance CLEAR (*)    Glucose, UA >=500 (*)    Hgb urine dipstick SMALL (*)    Protein, ur 100 (*)    All other components within normal limits  BASIC METABOLIC PANEL - Abnormal; Notable for the following  components:   Glucose, Bld 138 (*)    All other components within normal limits  CBG MONITORING, ED - Abnormal; Notable for the following components:   Glucose-Capillary 125 (*)    All other components within normal limits  CBC WITH DIFFERENTIAL/PLATELET     PROCEDURES:  Critical Care performed:   Procedures   MEDICATIONS ORDERED IN ED: Medications - No data to display   IMPRESSION / MDM / ASSESSMENT AND PLAN / ED COURSE  I reviewed the triage vital signs and the nursing notes.   Differential diagnosis includes, but is not limited to, urinary tract infection, diabetes with hyperglycemia, genital yeast, dysuria, kidney stones.  67 year old male presents to the ED with complaint of dysuria and a tingling sensation after urination.  He denies any penile discharge.  Patient has a history of diabetes and had discontinued taking his medication but recently restarted in the past week with metformin.  Urinalysis showed greater than 500 glucose, protein 100, no bacteria, nitrites or  leukocytes.  BMP fasting shows a blood sugar of 138.  A prescription for Diflucan     Patient's presentation is most consistent with acute complicated illness / injury requiring diagnostic workup.  FINAL CLINICAL IMPRESSION(S) / ED DIAGNOSES   Final diagnoses:  Yeast dermatitis     Rx / DC Orders   ED Discharge Orders          Ordered    fluconazole (DIFLUCAN) 150 MG tablet  Daily        06/02/22 0842             Note:  This document was prepared using Dragon voice recognition software and may include unintentional dictation errors.   Tommi Rumps, PA-C 06/02/22 1301    Willy Eddy, MD 06/02/22 [phone]

## 2022-06-02 NOTE — ED Triage Notes (Signed)
Pt presents ambulatory to triage via POV with complaints of chills/hot flashes over the last week with associated tingling in his penis after urination. Pt states that he has recently restarted his metformin and since then he has been having these waves of hot flashes. A&Ox4 at this time. Denies CP or SOB.

## 2022-06-02 NOTE — ED Notes (Signed)
See triage note  Presents with some dysuria   States sx's started couple of days ago  Unsure of fever but has had some "hot flashes and chills"  afebrile on arrival

## 2022-06-02 NOTE — Discharge Instructions (Signed)
Follow-up with your primary care provider if any continued problems.  A prescription for Diflucan was sent to the pharmacy for you to take.  You may also obtain over-the-counter Monistat if needed for something topically to help control itching.

## 2022-06-05 ENCOUNTER — Encounter: Payer: Self-pay | Admitting: Internal Medicine

## 2022-06-05 ENCOUNTER — Other Ambulatory Visit: Payer: Self-pay | Admitting: Internal Medicine

## 2022-06-05 ENCOUNTER — Telehealth: Payer: Self-pay | Admitting: Internal Medicine

## 2022-06-05 MED ORDER — FLUCONAZOLE 150 MG PO TABS: 150.0000 mg | ORAL_TABLET | Freq: Every day | ORAL | 0 refills | Status: DC

## 2022-06-05 NOTE — Telephone Encounter (Signed)
Patient went to ED on Friday, for a yeast infection and was given one pill. He still has some symptoms and wanted to know if she could call something in for him.

## 2022-06-05 NOTE — Telephone Encounter (Signed)
Pt is aware.  

## 2022-06-07 ENCOUNTER — Other Ambulatory Visit: Payer: Self-pay

## 2022-06-07 ENCOUNTER — Other Ambulatory Visit: Payer: Medicare HMO

## 2022-06-07 DIAGNOSIS — B20 Human immunodeficiency virus [HIV] disease: Secondary | ICD-10-CM | POA: Diagnosis not present

## 2022-06-07 DIAGNOSIS — E785 Hyperlipidemia, unspecified: Secondary | ICD-10-CM

## 2022-06-07 LAB — CBC WITH DIFFERENTIAL/PLATELET
Basophils Relative: 1 %
Eosinophils Absolute: 391 cells/uL (ref 15–500)
Hemoglobin: 15.1 g/dL (ref 13.2–17.1)
Monocytes Relative: 10.8 %
Neutrophils Relative %: 28.3 %

## 2022-06-08 LAB — T-HELPER CELL (CD4) - (RCID CLINIC ONLY)
CD4 % Helper T Cell: 38 % (ref 33–65)
CD4 T Cell Abs: 1214 /uL (ref 400–1790)

## 2022-06-08 LAB — COMPLETE METABOLIC PANEL WITH GFR
AG Ratio: 1.2 (calc) (ref 1.0–2.5)
Albumin: 4.5 g/dL (ref 3.6–5.1)
Globulin: 3.7 g/dL (calc) (ref 1.9–3.7)
Glucose, Bld: 108 mg/dL — ABNORMAL HIGH (ref 65–99)
Potassium: 4.3 mmol/L (ref 3.5–5.3)
Total Bilirubin: 0.4 mg/dL (ref 0.2–1.2)

## 2022-06-10 LAB — HIV-1 RNA QUANT-NO REFLEX-BLD
HIV 1 RNA Quant: NOT DETECTED {copies}/mL
HIV-1 RNA Quant, Log: NOT DETECTED {Log_copies}/mL

## 2022-06-10 LAB — COMPLETE METABOLIC PANEL WITH GFR
ALT: 42 U/L (ref 9–46)
AST: 39 U/L — ABNORMAL HIGH (ref 10–35)
Alkaline phosphatase (APISO): 51 U/L (ref 35–144)
BUN/Creatinine Ratio: 13 (calc) (ref 6–22)
BUN: 18 mg/dL (ref 7–25)
CO2: 25 mmol/L (ref 20–32)
Calcium: 10.1 mg/dL (ref 8.6–10.3)
Chloride: 105 mmol/L (ref 98–110)
Creat: 1.36 mg/dL — ABNORMAL HIGH (ref 0.70–1.35)
Sodium: 140 mmol/L (ref 135–146)
Total Protein: 8.2 g/dL — ABNORMAL HIGH (ref 6.1–8.1)
eGFR: 58 mL/min/{1.73_m2} — ABNORMAL LOW (ref >=60)

## 2022-06-10 LAB — T PALLIDUM AB: T Pallidum Abs: POSITIVE — AB

## 2022-06-10 LAB — LIPID PANEL
Cholesterol: 193 mg/dL
HDL: 43 mg/dL
LDL Cholesterol (Calc): 110 mg/dL — ABNORMAL HIGH
Non-HDL Cholesterol (Calc): 150 mg/dL — ABNORMAL HIGH
Total CHOL/HDL Ratio: 4.5 (calc)
Triglycerides: 303 mg/dL — ABNORMAL HIGH

## 2022-06-10 LAB — RPR TITER: RPR Titer: 1:1 {titer} — ABNORMAL HIGH

## 2022-06-10 LAB — CBC WITH DIFFERENTIAL/PLATELET
Absolute Monocytes: 767 cells/uL (ref 200–950)
Basophils Absolute: 71 cells/uL (ref 0–200)
Eosinophils Relative: 5.5 %
HCT: 43.6 % (ref 38.5–50.0)
Lymphs Abs: 3862 cells/uL (ref 850–3900)
MCH: 32.7 pg (ref 27.0–33.0)
MCHC: 34.6 g/dL (ref 32.0–36.0)
MCV: 94.4 fL (ref 80.0–100.0)
MPV: 9.6 fL (ref 7.5–12.5)
Neutro Abs: 2009 cells/uL (ref 1500–7800)
Platelets: 390 10*3/uL (ref 140–400)
RBC: 4.62 10*6/uL (ref 4.20–5.80)
RDW: 13.4 % (ref 11.0–15.0)
Total Lymphocyte: 54.4 %
WBC: 7.1 10*3/uL (ref 3.8–10.8)

## 2022-06-10 LAB — RPR: RPR Ser Ql: REACTIVE — AB

## 2022-06-20 ENCOUNTER — Ambulatory Visit (INDEPENDENT_AMBULATORY_CARE_PROVIDER_SITE_OTHER): Payer: Medicare HMO | Admitting: Internal Medicine

## 2022-06-20 ENCOUNTER — Encounter: Payer: Self-pay | Admitting: Internal Medicine

## 2022-06-20 VITALS — BP 146/80 | HR 72 | Temp 98.1°F | Ht 71.0 in | Wt 237.0 lb

## 2022-06-20 DIAGNOSIS — I10 Essential (primary) hypertension: Secondary | ICD-10-CM | POA: Diagnosis not present

## 2022-06-20 DIAGNOSIS — I350 Nonrheumatic aortic (valve) stenosis: Secondary | ICD-10-CM

## 2022-06-20 DIAGNOSIS — Z7984 Long term (current) use of oral hypoglycemic drugs: Secondary | ICD-10-CM

## 2022-06-20 DIAGNOSIS — J069 Acute upper respiratory infection, unspecified: Secondary | ICD-10-CM

## 2022-06-20 DIAGNOSIS — E1169 Type 2 diabetes mellitus with other specified complication: Secondary | ICD-10-CM

## 2022-06-20 DIAGNOSIS — E669 Obesity, unspecified: Secondary | ICD-10-CM | POA: Diagnosis not present

## 2022-06-20 DIAGNOSIS — Z8679 Personal history of other diseases of the circulatory system: Secondary | ICD-10-CM | POA: Diagnosis not present

## 2022-06-20 LAB — POCT GLYCOSYLATED HEMOGLOBIN (HGB A1C): Hemoglobin A1C: 6.7 % — AB (ref 4.0–5.6)

## 2022-06-20 MED ORDER — TELMISARTAN-HCTZ 80-12.5 MG PO TABS
1.0000 | ORAL_TABLET | Freq: Every day | ORAL | 1 refills | Status: DC
Start: 2022-06-20 — End: 2022-09-21

## 2022-06-20 NOTE — Patient Instructions (Addendum)
Your diabetes is well controlled,  but your blood pressure needs to be < 130/80  I have changed your medication to telmisartan /hct .  Take it once daily in the morning.   You can take 40 mg of your old telmisartan tonight instead of the usual 80 mg     You have a viral infection.  This should resolve in 5 to 7 days.  REST,  FLUIDS .    COUGH SUPPRESSION WITH ROBITUSSIN OR DELSYM  Albuterol MDI as needed for wheezing  You can use afrin every 12 hours for a maximum of 5 days,  to prevent  sinus infection from congestion .  If symptoms worsen or if you develop sinus congestion lasting > 5 days, call for additional medications

## 2022-06-20 NOTE — Progress Notes (Signed)
Subjective:  Patient ID: Adam Becker, male    DOB: [birth year]  Age: 67 y.o. MRN: [id number]  CC: There were no encounter diagnoses.   HPI CZAR TANGO presents for  Chief Complaint  Patient presents with   Acute Visit    Pt stated that he has had a pinching feeling on the right side. He stated that one time it was like under the skin of his right breast, later was under the right side rib cage area, then moved around to his back on the right side.   Pt stated that he has also had a productive cough for 3 or 4 days and a scratchy throat that started this morning.     CC:  FEELING WARM AND TINGLY FOR THE LAST SEVERAL DAYS. SEVERAL EPISODES OF A  pinching feeling ,  migrates, intermittent. Worse at night.  Aggravated by leaning over.   2) productive cough,  woke up today with a scratchy  throat, has not progressed ,  denies  fevers,  sinus pain.   several light headaches no sinus congestion.  Has heard himself do somewheezing "a little bit"  started last week. Sneezing  more than usual .  Using  an alka Seltzer  cold and flu  .  4) home reading 134/88    3) type 2 DM`:  using Jardiance.  Recent fasting sugars wa s209 after eating cake the night before  later on that dya it was 121 . No side effects from jardiance   Lab Results  Component Value Date   HGBA1C 6.7 (A) 06/20/2022     Lab Results  Component Value Date   HGBA1C 7.3 (H) 04/25/2022      3) htn:  TAKING TELMISARTAN      Outpatient Medications Prior to Visit  Medication Sig Dispense Refill   acetaminophen (TYLENOL) 500 MG tablet Take 1,000 mg by mouth every 6 (six) hours as needed for moderate pain or headache.     albuterol (VENTOLIN HFA) 108 (90 Base) MCG/ACT inhaler Inhale 2 puffs into the lungs every 6 (six) hours as needed for wheezing or shortness of breath. 8 g 2   allopurinol (ZYLOPRIM) 100 MG tablet TAKE 1 TABLET BY MOUTH EVERY DAY 90 tablet 3   ALPRAZolam (XANAX) 0.25 MG tablet Take 1 tablet  (0.25 mg total) by mouth at bedtime as needed for anxiety. 10 tablet 0   aspirin EC 81 MG tablet Take 1 tablet (81 mg total) by mouth daily. 90 tablet 3   Bempedoic Acid (NEXLETOL) 180 MG TABS Take 1 tablet (180 mg total) by mouth daily. 90 tablet 3   blood glucose meter kit and supplies KIT Dispense based on patient and insurance preference. Use up to four times daily as directed. 1 each 11   Blood Glucose Monitoring Suppl (ACCU-CHEK GUIDE ME) w/Device KIT 1 Device by Does not apply route 2 (two) times daily. 1 kit 0   cholecalciferol (VITAMIN D3) 25 MCG (1000 UNIT) tablet Take 1,000 Units by mouth daily.     darunavir (PREZISTA) 800 MG tablet Take 1 tablet (800 mg total) by mouth daily. 30 tablet 11   elvitegravir-cobicistat-emtricitabine-tenofovir (GENVOYA) 150-150-200-10 MG TABS tablet TAKE 1 TABLET BY MOUTH EVERY DAY WITH BREAKFAST 90 tablet 0   empagliflozin (JARDIANCE) 10 MG TABS tablet TAKE 1 TABLET BY MOUTH EVERY DAY BEFORE BREAKFAST 90 tablet 3   ezetimibe (ZETIA) 10 MG tablet Take 1 tablet (10 mg total) by mouth daily. 90 tablet 3  famotidine (PEPCID) 10 MG tablet Take 10 mg by mouth 2 (two) times daily as needed for heartburn or indigestion.     fluconazole (DIFLUCAN) 150 MG tablet Take 1 tablet (150 mg total) by mouth daily. 4 tablet 0   gabapentin (NEURONTIN) 300 MG capsule TAKE 2 CAPSULES BY MOUTH 3 TIMES DAILY. 180 capsule 5   Lancets (ONETOUCH ULTRASOFT) lancets Use as instructed to check glucose BID 100 each 12   loratadine (CLARITIN) 10 MG tablet Take 10 mg by mouth daily as needed for allergies.     metFORMIN (GLUCOPHAGE-XR) 500 MG 24 hr tablet Take 1 tablet (500 mg total) by mouth daily with breakfast. 90 tablet 1   rOPINIRole (REQUIP) 1 MG tablet Take 1.5 mg by mouth 4 (four) times daily.     telmisartan (MICARDIS) 40 MG tablet Take 2 tablets (80 mg total) by mouth daily. 90 tablet 1   tiZANidine (ZANAFLEX) 4 MG tablet Take 1 tablet (4 mg total) by mouth every 6 (six) hours  as needed for muscle spasms. 30 tablet 0   vitamin B-12 (CYANOCOBALAMIN) 1000 MCG tablet Take 1,000 mcg by mouth daily.     No facility-administered medications prior to visit.    Review of Systems;  Patient denies headache, fevers, malaise, unintentional weight loss, skin rash, eye pain, sinus congestion and sinus pain, sore throat, dysphagia,  hemoptysis , cough, dyspnea, wheezing, chest pain, palpitations, orthopnea, edema, abdominal pain, nausea, melena, diarrhea, constipation, flank pain, dysuria, hematuria, urinary  Frequency, nocturia, numbness, tingling, seizures,  Focal weakness, Loss of consciousness,  Tremor, insomnia, depression, anxiety, and suicidal ideation.      Objective:  BP (!) 146/80   Pulse 72   Temp 98.1 F (36.7 C) (Oral)   Ht 5\' 11"  (1.803 m)   Wt 237 lb (107.5 kg)   SpO2 94%   BMI 33.05 kg/m   BP Readings from Last 3 Encounters:  06/20/22 (!) 146/80  06/02/22 131/87  05/16/22 136/80    Wt Readings from Last 3 Encounters:  06/20/22 237 lb (107.5 kg)  06/02/22 230 lb (104.3 kg)  05/16/22 236 lb 6.4 oz (107.2 kg)    Physical Exam  Lab Results  Component Value Date   HGBA1C 7.3 (H) 04/25/2022   HGBA1C 6.8 (H) 11/29/2021   HGBA1C 6.2 07/05/2021    Lab Results  Component Value Date   CREATININE 1.36 (H) 06/07/2022   CREATININE 1.15 06/02/2022   CREATININE 1.30 04/25/2022    Lab Results  Component Value Date   WBC 7.1 06/07/2022   HGB 15.1 06/07/2022   HCT 43.6 06/07/2022   PLT 390 06/07/2022   GLUCOSE 108 (H) 06/07/2022   CHOL 193 06/07/2022   TRIG 303 (H) 06/07/2022   HDL 43 06/07/2022   LDLDIRECT 151.0 04/25/2022   LDLCALC 110 (H) 06/07/2022   ALT 42 06/07/2022   AST 39 (H) 06/07/2022   NA 140 06/07/2022   K 4.3 06/07/2022   CL 105 06/07/2022   CREATININE 1.36 (H) 06/07/2022   BUN 18 06/07/2022   CO2 25 06/07/2022   TSH 1.06 04/29/2020   PSA 0.45 04/29/2020   HGBA1C 7.3 (H) 04/25/2022   MICROALBUR 14.5 (H) 05/16/2022     No results found.  Assessment & Plan:  .There are no diagnoses linked to this encounter.   I provided 30 minutes of face-to-face time during this encounter reviewing patient's last visit with me, patient's  most recent visit with cardiology,  nephrology,  and neurology,  recent surgical  and non surgical procedures, previous  labs and imaging studies, counseling on currently addressed issues,  and post visit ordering to diagnostics and therapeutics .   Follow-up: No follow-ups on file.   Sherlene Shams, MD

## 2022-06-21 ENCOUNTER — Ambulatory Visit (INDEPENDENT_AMBULATORY_CARE_PROVIDER_SITE_OTHER): Payer: Medicare HMO | Admitting: Infectious Disease

## 2022-06-21 ENCOUNTER — Encounter: Payer: Self-pay | Admitting: Infectious Disease

## 2022-06-21 ENCOUNTER — Other Ambulatory Visit: Payer: Self-pay

## 2022-06-21 VITALS — BP 143/86 | HR 80 | Temp 98.2°F | Wt 240.0 lb

## 2022-06-21 DIAGNOSIS — Z23 Encounter for immunization: Secondary | ICD-10-CM | POA: Diagnosis not present

## 2022-06-21 DIAGNOSIS — G729 Myopathy, unspecified: Secondary | ICD-10-CM

## 2022-06-21 DIAGNOSIS — I10 Essential (primary) hypertension: Secondary | ICD-10-CM

## 2022-06-21 DIAGNOSIS — Z7185 Encounter for immunization safety counseling: Secondary | ICD-10-CM

## 2022-06-21 DIAGNOSIS — Z950 Presence of cardiac pacemaker: Secondary | ICD-10-CM | POA: Diagnosis not present

## 2022-06-21 DIAGNOSIS — B20 Human immunodeficiency virus [HIV] disease: Secondary | ICD-10-CM | POA: Diagnosis not present

## 2022-06-21 DIAGNOSIS — I442 Atrioventricular block, complete: Secondary | ICD-10-CM

## 2022-06-21 DIAGNOSIS — R748 Abnormal levels of other serum enzymes: Secondary | ICD-10-CM | POA: Diagnosis not present

## 2022-06-21 HISTORY — DX: Human immunodeficiency virus (HIV) disease: B20

## 2022-06-21 MED ORDER — GENVOYA 150-150-200-10 MG PO TABS: ORAL_TABLET | ORAL | 11 refills | Status: DC

## 2022-06-21 MED ORDER — DARUNAVIR 800 MG PO TABS: 800.0000 mg | ORAL_TABLET | Freq: Every day | ORAL | 11 refills | Status: DC

## 2022-06-21 NOTE — Progress Notes (Signed)
Subjective:  Chief complaint: Follow-up for HIV disease on medications   Patient ID: Adam Becker, male    DOB: 13-Nov-1955, 67 y.o.   MRN: [id number]  HPI  67 year old man with HIV currently well controlled on Genvoya with Prezista.  Ulyses Southward had compiled prior R data as seen below  HIV Genotype Composite Data Genotype Dates:    Mutations in [place] impact drug susceptibility RT Mutations G190EQ, M41L, D67N, K70R, L74V, V75A, M184V, T215Y, K219Q  PI Mutations D30N, N88D  Integrase Mutations      Interpretation of Genotype Data per [place] HIV Database Nucleoside RTIs  abacavir (ABC) High-Level Resistance zidovudine (AZT)  High-Level Resistance stavudine (D4T)  High-Level Resistance didanosine (DDI)   High-Level Resistance emtricitabine (FTC)  High-Level Resistance lamivudine (3TC)  High-Level Resistance tenofovir (TDF)  Intermediate Resistance    Non-Nucleoside RTIs  efavirenz (EFV)  High-Level Resistance etravirine (ETR)  Intermediate Resistance nevirapine (NVP) High-Level Resistance rilpivirine (RPV) High-Level Resistance    Protease Inhibitors  atazanavir/r (ATV/r)           Potential Low-Level Resistance darunavir/r (DRV/r)           Susceptible fosamprenavir/r (FPV/r)           Susceptible indinavir/r (IDV/r) Susceptible lopinavir/r (LPV/r)            Susceptible saquinavir/r (SQV/r)           Potential Low-Level Resistance tipranavir/r (TPV/r)           Susceptible    Integrase Inhibitors  None   He was maintained nice virological suppression on Genvoya and Prezista.  In the past we considered  switch to [place] or consideration of Biktarvy + BID Rukobia (I would go to latter if trying to avoid COBI.  He has been diagnosed with a statin myopathy but as still had pain and ultimately underwent a biopsy at [facility] which came back consistent with a myopathy but not for a specific 1 was positive to could be related to herbal supplements although he  stopped them.  Metformin was also mentioned in the differential.  Lamarquis continues to do well and is adherent to his [place] and Prezista.        Past Medical History:  Diagnosis Date   1st degree AV block    seen on ekg   Asplenia 11/11/2014   Chronic kidney disease 11/2011   admission for [facility] cr 11, acidosis   Diabetes mellitus without complication (HCC)    Type II   GERD (gastroesophageal reflux disease)    Gout    Heart murmur    HIV infection (HCC) 1994   Hyperglycemia 04/04/2016   Hyperlipidemia    Hypertension    Impaired fasting glucose 07/27/2015   LLL pneumonia 07/06/2021   Myopathy 06/21/2021   Neuromuscular disorder (HCC)    peripheral neuropathy   Neuropathy    Umbilical hernia    Urethritis 02/11/2015    Past Surgical History:  Procedure Laterality Date   COLONOSCOPY     LIPOSUCTION HEAD / NECK  2001   posterior neck   MUSCLE BIOPSY Right 04/01/2021   Procedure: MUSCLE BIOPSY;  Surgeon: Earline Mayotte, MD;  Location: [facility] ORS;  Service: General;  Laterality: Right;  gastrocnemius; O.R. to notify pathology of procedure   SPLENECTOMY  2000   reason unclear "stopped working"    Family History  Problem Relation Age of Onset   Cancer Mother    Breast cancer Mother  Colon polyps Father    Colon cancer Father    Birth defects Son        Breast    Rectal cancer Neg Hx    Stomach cancer Neg Hx       Social History   Socioeconomic History   Marital status: Single    Spouse name: Not on file   Number of children: Not on file   Years of education: Not on file   Highest education level: Not on file  Occupational History   Not on file  Tobacco Use   Smoking status: Never   Smokeless tobacco: Never  Vaping Use   Vaping Use: Never used  Substance and Sexual Activity   Alcohol use: No   Drug use: No   Sexual activity: Not Currently    Comment: declined condoms  Other Topics Concern   Not on file  Social History Narrative    Independent baseline. Lives by himself.  Education 12th.  Children none.  Caffeine tea one cup 4 x week.   Social Determinants of Health   Financial Resource Strain: Low Risk  (08/16/2021)   Overall Financial Resource Strain (CARDIA)    Difficulty of Paying Living Expenses: Not hard at all  Food Insecurity: No Food Insecurity (08/16/2021)   Hunger Vital Sign    Worried About Running Out of Food in the Last Year: Never true    Ran Out of Food in the Last Year: Never true  Transportation Needs: No Transportation Needs (08/16/2021)   PRAPARE - Administrator, Civil Service (Medical): No    Lack of Transportation (Non-Medical): No  Physical Activity: Sufficiently Active (08/12/2020)   Exercise Vital Sign    Days of Exercise per Week: 7 days    Minutes of Exercise per Session: 30 min  Stress: No Stress Concern Present (08/16/2021)   [employer] of Occupational Health - Occupational Stress Questionnaire    Feeling of Stress : Not at all  Social Connections: Unknown (08/16/2021)   Social Connection and Isolation Panel [NHANES]    Frequency of Communication with Friends and Family: More than three times a week    Frequency of Social Gatherings with Friends and Family: More than three times a week    Attends Religious Services: More than 4 times per year    Active Member of [employer] or Organizations: Yes    Attends Engineer, structural: More than 4 times per year    Marital Status: Not on file    Allergies  Allergen Reactions   Pravastatin Other (See Comments)    rhabdomyolysis     Current Outpatient Medications:    acetaminophen (TYLENOL) 500 MG tablet, Take 1,000 mg by mouth every 6 (six) hours as needed for moderate pain or headache., Disp: , Rfl:    albuterol (VENTOLIN HFA) 108 (90 Base) MCG/ACT inhaler, Inhale 2 puffs into the lungs every 6 (six) hours as needed for wheezing or shortness of breath., Disp: 8 g, Rfl: 2   allopurinol (ZYLOPRIM) 100 MG tablet, TAKE  1 TABLET BY MOUTH EVERY DAY, Disp: 90 tablet, Rfl: 3   ALPRAZolam (XANAX) 0.25 MG tablet, Take 1 tablet (0.25 mg total) by mouth at bedtime as needed for anxiety., Disp: 10 tablet, Rfl: 0   aspirin EC 81 MG tablet, Take 1 tablet (81 mg total) by mouth daily., Disp: 90 tablet, Rfl: 3   Bempedoic Acid (NEXLETOL) 180 MG TABS, Take 1 tablet (180 mg total) by mouth daily., Disp: 90  tablet, Rfl: 3   blood glucose meter kit and supplies KIT, Dispense based on patient and insurance preference. Use up to four times daily as directed., Disp: 1 each, Rfl: 11   Blood Glucose Monitoring Suppl (ACCU-CHEK GUIDE ME) w/Device KIT, 1 Device by Does not apply route 2 (two) times daily., Disp: 1 kit, Rfl: 0   cholecalciferol (VITAMIN D3) 25 MCG (1000 UNIT) tablet, Take 1,000 Units by mouth daily., Disp: , Rfl:    darunavir (PREZISTA) 800 MG tablet, Take 1 tablet (800 mg total) by mouth daily., Disp: 30 tablet, Rfl: 11   elvitegravir-cobicistat-emtricitabine-tenofovir (GENVOYA) 150-150-200-10 MG TABS tablet, TAKE 1 TABLET BY MOUTH EVERY DAY WITH BREAKFAST, Disp: 90 tablet, Rfl: 0   empagliflozin (JARDIANCE) 10 MG TABS tablet, TAKE 1 TABLET BY MOUTH EVERY DAY BEFORE BREAKFAST, Disp: 90 tablet, Rfl: 3   ezetimibe (ZETIA) 10 MG tablet, Take 1 tablet (10 mg total) by mouth daily., Disp: 90 tablet, Rfl: 3   famotidine (PEPCID) 10 MG tablet, Take 10 mg by mouth 2 (two) times daily as needed for heartburn or indigestion., Disp: , Rfl:    fluconazole (DIFLUCAN) 150 MG tablet, Take 1 tablet (150 mg total) by mouth daily., Disp: 4 tablet, Rfl: 0   gabapentin (NEURONTIN) 300 MG capsule, TAKE 2 CAPSULES BY MOUTH 3 TIMES DAILY., Disp: 180 capsule, Rfl: 5   Lancets (ONETOUCH ULTRASOFT) lancets, Use as instructed to check glucose BID, Disp: 100 each, Rfl: 12   loratadine (CLARITIN) 10 MG tablet, Take 10 mg by mouth daily as needed for allergies., Disp: , Rfl:    metFORMIN (GLUCOPHAGE-XR) 500 MG 24 hr tablet, Take 1 tablet (500 mg  total) by mouth daily with breakfast., Disp: 90 tablet, Rfl: 1   rOPINIRole (REQUIP) 1 MG tablet, Take 1.5 mg by mouth 4 (four) times daily., Disp: , Rfl:    telmisartan-hydrochlorothiazide (MICARDIS HCT) 80-12.5 MG tablet, Take 1 tablet by mouth daily., Disp: 90 tablet, Rfl: 1   tiZANidine (ZANAFLEX) 4 MG tablet, Take 1 tablet (4 mg total) by mouth every 6 (six) hours as needed for muscle spasms., Disp: 30 tablet, Rfl: 0   vitamin B-12 (CYANOCOBALAMIN) 1000 MCG tablet, Take 1,000 mcg by mouth daily., Disp: , Rfl:    Review of Systems  Constitutional:  Negative for activity change, appetite change, chills, diaphoresis, fatigue, fever and unexpected weight change.  HENT:  Negative for congestion, rhinorrhea, sinus pressure, sneezing, sore throat and trouble swallowing.   Eyes:  Negative for photophobia and visual disturbance.  Respiratory:  Negative for cough, chest tightness, shortness of breath, wheezing and stridor.   Cardiovascular:  Negative for chest pain, palpitations and leg swelling.  Gastrointestinal:  Negative for abdominal distention, abdominal pain, anal bleeding, blood in stool, constipation, diarrhea, nausea and vomiting.  Genitourinary:  Negative for difficulty urinating, dysuria, flank pain and hematuria.  Musculoskeletal:  Negative for arthralgias, back pain, gait problem, joint swelling and myalgias.  Skin:  Negative for color change, pallor, rash and wound.  Neurological:  Negative for dizziness, tremors, weakness, light-headedness and headaches.  Hematological:  Negative for adenopathy. Does not bruise/bleed easily.  Psychiatric/Behavioral:  Negative for agitation, behavioral problems, confusion, decreased concentration, dysphoric mood, sleep disturbance and suicidal ideas.        Objective:   Physical Exam Constitutional:      Appearance: He is well-developed.  HENT:     Head: Normocephalic and atraumatic.  Eyes:     Conjunctiva/sclera: Conjunctivae normal.   Cardiovascular:     Rate  and Rhythm: Normal rate and regular rhythm.  Pulmonary:     Effort: Pulmonary effort is normal. No respiratory distress.     Breath sounds: No wheezing.  Abdominal:     General: There is no distension.     Palpations: Abdomen is soft.  Musculoskeletal:        General: No tenderness. Normal range of motion.     Cervical back: Normal range of motion and neck supple.  Skin:    General: Skin is warm and dry.     Coloration: Skin is not pale.     Findings: No erythema or rash.  Neurological:     General: No focal deficit present.     Mental Status: He is alert and oriented to person, place, and time.  Psychiatric:        Mood and Affect: Mood normal.        Behavior: Behavior normal.        Thought Content: Thought content normal.        Judgment: Judgment normal.           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  Vaccine counseling: Mended updated COVID-19 vaccine times second dose since he is over 65 which he received

## 2022-06-21 NOTE — Assessment & Plan Note (Signed)
He is well appearing and has no signs of pharyngitis or bronchitis.  Supportive care outlined,  advised to call if he develops pleurisy,  fevers.  Sinus or ear pain

## 2022-06-21 NOTE — Assessment & Plan Note (Signed)
Not at goal on telmisartan alone.  Adding hctz

## 2022-06-21 NOTE — Assessment & Plan Note (Signed)
Found on ECHO during workup for systolic murmur.  Now managed by Cardiology

## 2022-06-21 NOTE — Assessment & Plan Note (Signed)
well-controlled on current medications.  hemoglobin A1c  is now  less than 7.0 . Patient is up-to-date on eye exams and foot exam is normal today. Patient has increased  urine microalbumin to creatinine ratio at next visit. 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  Adding hctz   Lab Results  Component Value Date   HGBA1C 6.7 (A) 06/20/2022   Lab Results  Component Value Date   MICROALBUR 14.5 (H) 05/16/2022   MICROALBUR 5.6 (H) 02/22/2021

## 2022-06-22 ENCOUNTER — Telehealth: Payer: Self-pay | Admitting: Internal Medicine

## 2022-06-22 NOTE — Telephone Encounter (Signed)
Per triage note pt was given some home care instructions of:   HOME CARE: * You should be able to treat this at home. REASSURANCE AND EDUCATION - NOSEBLEED: * Nosebleeds are common. PINCH THE NOSTRILS TO STOP A NOSEBLEED: * First gently blow the nose to clear out any large clots. * LEAN FORWARD: Sit down and lean forward. Reason: Blood makes people choke if they lean backwards. [facility] THE NOSE: Gently squeeze the soft parts of the lower nose (nostrils) together. Use your thumb and your index finger in a pinching manner. Do this for 15 minutes. Use a clock or watch to measure the time. Goal: Apply constant pressure to the bleeding point. CALL BACK IF: * Nose bleeding lasts longer than 30 minutes with using direct pressure * Nosebleeds become worse * You become worse

## 2022-06-22 NOTE — Telephone Encounter (Signed)
Pt called stating when he sneeze and blow his nose, his nose starts to bleed and he is coughing up blood sent to access nurse

## 2022-06-26 MED ORDER — AMOXICILLIN-POT CLAVULANATE 875-125 MG PO TABS
1.0000 | ORAL_TABLET | Freq: Two times a day (BID) | ORAL | 0 refills | Status: DC
Start: 2022-06-26 — End: 2022-07-20

## 2022-06-26 NOTE — Telephone Encounter (Signed)
Yes, augmentin has been sent to [facility]  Daily use of Probiotics for  3 weeks advised to reduce risk of C dificile colitis.

## 2022-06-26 NOTE — Telephone Encounter (Signed)
Pt is aware and gave a verbal understanding.  

## 2022-06-26 NOTE — Telephone Encounter (Signed)
Pt called stating his sinuses are not getting better and he would like medication called in

## 2022-06-26 NOTE — Telephone Encounter (Signed)
Spoke with pt and he stated that his symptoms are not getting any better. He is taking Mucinex and drinking lots of fluids. Pt stated that the congestion is now in his chest, he has a productive cough and the phlegm is yellowish brown, and he has a headache. Pt is wondering if an antibiotic could be called in.

## 2022-06-28 DIAGNOSIS — Z950 Presence of cardiac pacemaker: Secondary | ICD-10-CM | POA: Diagnosis not present

## 2022-06-28 DIAGNOSIS — I10 Essential (primary) hypertension: Secondary | ICD-10-CM | POA: Diagnosis not present

## 2022-06-28 DIAGNOSIS — Z45018 Encounter for adjustment and management of other part of cardiac pacemaker: Secondary | ICD-10-CM | POA: Diagnosis not present

## 2022-06-28 DIAGNOSIS — I442 Atrioventricular block, complete: Secondary | ICD-10-CM | POA: Diagnosis not present

## 2022-07-18 ENCOUNTER — Telehealth: Payer: Self-pay | Admitting: Internal Medicine

## 2022-07-18 NOTE — Telephone Encounter (Signed)
Pt called stating his legs are cramping like before and does not know if he need to come in or not

## 2022-07-18 NOTE — Telephone Encounter (Signed)
Spoke with pt and scheduled him for Thursday at 11:30. Pt was wondering if it could be his neuropathy. Pt is taking Gabapentin 600 mg TID.

## 2022-07-20 ENCOUNTER — Ambulatory Visit (INDEPENDENT_AMBULATORY_CARE_PROVIDER_SITE_OTHER): Payer: Medicare HMO | Admitting: Internal Medicine

## 2022-07-20 VITALS — BP 110/72 | HR 79 | Temp 98.0°F | Resp 16 | Ht 71.0 in | Wt 238.0 lb

## 2022-07-20 DIAGNOSIS — R748 Abnormal levels of other serum enzymes: Secondary | ICD-10-CM

## 2022-07-20 DIAGNOSIS — G729 Myopathy, unspecified: Secondary | ICD-10-CM

## 2022-07-20 DIAGNOSIS — G720 Drug-induced myopathy: Secondary | ICD-10-CM

## 2022-07-20 DIAGNOSIS — T466X5A Adverse effect of antihyperlipidemic and antiarteriosclerotic drugs, initial encounter: Secondary | ICD-10-CM | POA: Diagnosis not present

## 2022-07-20 MED ORDER — ACCU-CHEK SOFTCLIX LANCETS MISC: 12 refills | Status: AC

## 2022-07-20 MED ORDER — ACCU-CHEK GUIDE VI STRP: ORAL_STRIP | 12 refills | Status: DC

## 2022-07-20 NOTE — Patient Instructions (Addendum)
I agree that the symptoms you had were likely not the return of myopathy since they have been intermittent  however if they become more persistent,  please schedule a lab visit to check your CK muscle enzyme)   You can increase the gabapentin dose to 900 mg in the morning or afternoon.  The maximal daily dose is 2400 mg and you are currently at 1800 mg (600 mg x 3 times daily)

## 2022-07-20 NOTE — Assessment & Plan Note (Signed)
Cause unclear,  Has persisted despite  Discontinuation of statin.   CK is periodically  elevated without change in medications.  .  Muscle biopsy done whil OFF OF STATIN  was nonspecific for statin  damage.  Will recheck CK today   Lab Results  Component Value Date   CKTOTAL 654 (H) 06/21/2021   CKMB 2.7 11/26/2011   TROPONINI < 0.02 11/26/2011

## 2022-07-20 NOTE — Assessment & Plan Note (Signed)
Haused by concurrent use of pravastatin and Genvoya.  CK transiently  normalized after 7 months of suspension of pravastatin but has risen in the more recent past with unclear etiology,  reviewed muscle biopsy which  was not specific for statin toxicity  but did suggest a drug toxicity   Lab Results  Component Value Date   CKTOTAL 654 (H) 06/21/2021   CKMB 2.7 11/26/2011   TROPONINI < 0.02 11/26/2011

## 2022-07-20 NOTE — Assessment & Plan Note (Signed)
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 No signs of vascultis .  Was not taking pravastatin at the time.   

## 2022-07-20 NOTE — Progress Notes (Signed)
Subjective:  Patient ID: Adam Becker, male    DOB: [birth year]  Age: 67 y.o. MRN: [id number]  CC: The primary encounter diagnosis was Elevated CK. Diagnoses of Myopathy and Statin myopathy were also pertinent to this visit.   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  He states it may be his neuropathy , not actual cramps .  The feeling has been intermittent and and below the calf muscle  and occurred after walking on the treadmill and resolved  with massage . Has not recurred     Outpatient Medications Prior to Visit  Medication Sig Dispense Refill   acetaminophen (TYLENOL) 500 MG tablet Take 1,000 mg by mouth every 6 (six) hours as needed for moderate pain or headache.     allopurinol (ZYLOPRIM) 100 MG tablet TAKE 1 TABLET BY MOUTH EVERY DAY 90 tablet 3   aspirin EC 81 MG tablet Take 1 tablet (81 mg total) by mouth daily. 90 tablet 3   Bempedoic Acid (NEXLETOL) 180 MG TABS Take 1 tablet (180 mg total) by mouth daily. 90 tablet 3   Blood Glucose Monitoring Suppl (ACCU-CHEK GUIDE ME) w/Device KIT 1 Device by Does not apply route 2 (two) times daily. 1 kit 0   cholecalciferol (VITAMIN D3) 25 MCG (1000 UNIT) tablet Take 1,000 Units by mouth daily.     darunavir (PREZISTA) 800 MG tablet Take 1 tablet (800 mg total) by mouth daily. 30 tablet 11   elvitegravir-cobicistat-emtricitabine-tenofovir (GENVOYA) 150-150-200-10 MG TABS tablet TAKE 1 TABLET BY MOUTH EVERY DAY WITH BREAKFAST 30 tablet 11   empagliflozin (JARDIANCE) 10 MG TABS tablet TAKE 1 TABLET BY MOUTH EVERY DAY BEFORE BREAKFAST 90 tablet 3   ezetimibe (ZETIA) 10 MG tablet Take 1 tablet (10 mg total) by mouth daily. 90 tablet 3    famotidine (PEPCID) 10 MG tablet Take 10 mg by mouth 2 (two) times daily as needed for heartburn or indigestion.     gabapentin (NEURONTIN) 300 MG capsule TAKE 2 CAPSULES BY MOUTH 3 TIMES DAILY. 180 capsule 5   loratadine (CLARITIN) 10 MG tablet Take 10 mg by mouth daily as needed for allergies.     metFORMIN (GLUCOPHAGE-XR) 500 MG 24 hr tablet Take 1 tablet (500 mg total) by mouth daily with breakfast. 90 tablet 1   rOPINIRole (REQUIP) 1 MG tablet Take 1.5 mg by mouth 4 (four) times daily.     telmisartan-hydrochlorothiazide (MICARDIS HCT) 80-12.5 MG tablet Take 1 tablet by mouth daily. 90 tablet 1   tiZANidine (ZANAFLEX) 4 MG tablet Take 1 tablet (4 mg total) by mouth every 6 (six) hours as needed for muscle spasms. 30 tablet 0   vitamin B-12 (CYANOCOBALAMIN) 1000 MCG tablet Take 1,000 mcg by mouth daily.     albuterol (VENTOLIN HFA) 108 (90 Base) MCG/ACT inhaler Inhale 2 puffs into the lungs every 6 (six) hours as needed for wheezing or shortness of breath. 8 g 2   ALPRAZolam (XANAX) 0.25 MG tablet Take 1 tablet (0.25 mg total) by mouth at bedtime as needed for anxiety. 10 tablet 0   amoxicillin-clavulanate (AUGMENTIN) 875-125 MG tablet Take 1 tablet by mouth 2 (two) times  daily. 14 tablet 0   blood glucose meter kit and supplies KIT Dispense based on patient and insurance preference. Use up to four times daily as directed. 1 each 11   fluconazole (DIFLUCAN) 150 MG tablet Take 1 tablet (150 mg total) by mouth daily. 4 tablet 0   Lancets (ONETOUCH ULTRASOFT) lancets Use as instructed to check glucose BID 100 each 12   No facility-administered medications prior to visit.    Review of Systems;  Patient denies headache, fevers, malaise, unintentional weight loss, skin rash, eye pain, sinus congestion and sinus pain, sore throat, dysphagia,  hemoptysis , cough, dyspnea, wheezing, chest pain, palpitations, orthopnea, edema, abdominal pain, nausea, melena, diarrhea, constipation, flank pain, dysuria,  hematuria, urinary  Frequency, nocturia, numbness, tingling, seizures,  Focal weakness, Loss of consciousness,  Tremor, insomnia, depression, anxiety, and suicidal ideation.      Objective:  BP 110/72   Pulse 79   Temp 98 F (36.7 C)   Resp 16   Ht 5\' 11"  (1.803 m)   Wt 238 lb (108 kg)   SpO2 99%   BMI 33.19 kg/m   BP Readings from Last 3 Encounters:  07/20/22 110/72  06/21/22 (!) 143/86  06/20/22 (!) 146/80    Wt Readings from Last 3 Encounters:  07/20/22 238 lb (108 kg)  06/21/22 240 lb (108.9 kg)  06/20/22 237 lb (107.5 kg)    Physical Exam Vitals reviewed.  Constitutional:      General: He is not in acute distress.    Appearance: Normal appearance. He is normal weight. He is not ill-appearing, toxic-appearing or diaphoretic.  HENT:     Head: Normocephalic.  Eyes:     General: No scleral icterus.       Right eye: No discharge.        Left eye: No discharge.     Conjunctiva/sclera: Conjunctivae normal.  Cardiovascular:     Rate and Rhythm: Normal rate and regular rhythm.     Heart sounds: Normal heart sounds.  Pulmonary:     Effort: Pulmonary effort is normal. No respiratory distress.     Breath sounds: Normal breath sounds.  Musculoskeletal:        General: No swelling or tenderness. Normal range of motion.     Cervical back: Normal range of motion.  Skin:    General: Skin is warm and dry.  Neurological:     General: No focal deficit present.     Mental Status: He is alert and oriented to person, place, and time. Mental status is at baseline.  Psychiatric:        Mood and Affect: Mood normal.        Behavior: Behavior normal.        Thought Content: Thought content normal.        Judgment: Judgment normal.    Lab Results  Component Value Date   HGBA1C 6.7 (A) 06/20/2022   HGBA1C 7.3 (H) 04/25/2022   HGBA1C 6.8 (H) 11/29/2021    Lab Results  Component Value Date   CREATININE 1.36 (H) 06/07/2022   CREATININE 1.15 06/02/2022   CREATININE 1.30  04/25/2022    Lab Results  Component Value Date   WBC 7.1 06/07/2022   HGB 15.1 06/07/2022   HCT 43.6 06/07/2022   PLT 390 06/07/2022   GLUCOSE 108 (H) 06/07/2022   CHOL 193 06/07/2022   TRIG 303 (H) 06/07/2022   HDL 43 06/07/2022   LDLDIRECT 151.0 04/25/2022   LDLCALC 110 (H) 06/07/2022  ALT 42 06/07/2022   AST 39 (H) 06/07/2022   NA 140 06/07/2022   K 4.3 06/07/2022   CL 105 06/07/2022   CREATININE 1.36 (H) 06/07/2022   BUN 18 06/07/2022   CO2 25 06/07/2022   TSH 1.06 04/29/2020   PSA 0.45 04/29/2020   HGBA1C 6.7 (A) 06/20/2022   MICROALBUR 14.5 (H) 05/16/2022    No results found.  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 No signs of vascultis .  Was not taking pravastatin at the time.    Orders: -     CK; Future  Myopathy Assessment & Plan: Cause unclear,  Has persisted despite  Discontinuation of statin.   CK is periodically  elevated without change in medications.  .  Muscle biopsy done whil OFF OF STATIN  was nonspecific for statin  damage.  Will recheck CK today   Lab Results  Component Value Date   CKTOTAL 654 (H) 06/21/2021   CKMB 2.7 11/26/2011   TROPONINI < 0.02 11/26/2011      Orders: -     CK; Future  Statin myopathy Assessment & Plan: Haused by concurrent use of pravastatin and Genvoya.  CK transiently  normalized after 7 months of suspension of pravastatin but has risen in the more recent past with unclear etiology,  reviewed muscle biopsy which  was not specific for statin toxicity  but did suggest a drug toxicity   Lab Results  Component Value Date   CKTOTAL 654 (H) 06/21/2021   CKMB 2.7 11/26/2011   TROPONINI < 0.02 11/26/2011       Other orders -     Accu-Chek Softclix Lancets; Use twice daily to check blood sugars  E11.69  Dispense: 100 each; Refill: 12 -     Accu-Chek Guide; Use twice daily to check blood sugars   E11.69  Dispense: 100 each; Refill: 12     I provided 30 minutes of face-to-face time during this encounter reviewing patient's last visit with me, patient's  most recent visit with cardiology,  nephrology,  and neurology,  recent surgical and non surgical procedures, previous  labs and imaging studies, counseling on currently addressed issues,  and post visit ordering to diagnostics and therapeutics .   Follow-up: Return in about 3 months (around 10/20/2022) for follow up diabetes.   Sherlene Shams, MD

## 2022-07-24 ENCOUNTER — Telehealth: Payer: Self-pay | Admitting: Internal Medicine

## 2022-07-24 NOTE — Telephone Encounter (Signed)
Patient called and stated that is insurance will not pay for the test stripes. The want him to go back to the OneStep.  Pharmacy is [facility] in [place].

## 2022-07-26 MED ORDER — GLUCOSE BLOOD VI STRP: ORAL_STRIP | 12 refills | Status: AC

## 2022-07-26 MED ORDER — ONETOUCH ULTRASOFT LANCETS MISC: 12 refills | Status: AC

## 2022-07-26 NOTE — Telephone Encounter (Signed)
LMTCB. Need to clarify if its onestep or one touch test stripes that he is needing.

## 2022-07-26 NOTE — Telephone Encounter (Signed)
Pt returned Shanda Bumps CMA call. Note below was read to him. Pt stated its the Onetouch and he also needs the needles.

## 2022-07-26 NOTE — Addendum Note (Signed)
Addended by: Sandy Salaam on: 07/26/2022 02:43 PM   Modules accepted: Orders

## 2022-07-26 NOTE — Telephone Encounter (Signed)
One touch lancets and test strips have been sent in.

## 2022-08-11 ENCOUNTER — Ambulatory Visit (INDEPENDENT_AMBULATORY_CARE_PROVIDER_SITE_OTHER): Payer: Medicare HMO | Admitting: Nurse Practitioner

## 2022-08-11 ENCOUNTER — Encounter: Payer: Self-pay | Admitting: Nurse Practitioner

## 2022-08-11 VITALS — BP 122/82 | HR 78 | Temp 98.0°F | Ht 71.0 in | Wt 236.6 lb

## 2022-08-11 DIAGNOSIS — R399 Unspecified symptoms and signs involving the genitourinary system: Secondary | ICD-10-CM | POA: Diagnosis not present

## 2022-08-11 LAB — POCT URINALYSIS DIPSTICK
Bilirubin, UA: NEGATIVE
Blood, UA: NEGATIVE
Glucose, UA: POSITIVE — AB
Ketones, UA: NEGATIVE
Leukocytes, UA: NEGATIVE
Nitrite, UA: NEGATIVE
Protein, UA: NEGATIVE
Spec Grav, UA: 1.015 (ref 1.010–1.025)
Urobilinogen, UA: 0.2 E.U./dL
pH, UA: 5.5 (ref 5.0–8.0)

## 2022-08-11 NOTE — Progress Notes (Signed)
Established Patient Office Visit  Subjective:  Patient ID: Adam Becker, male    DOB: 19-Apr-1955  Age: 67 y.o. MRN: [id number]  CC:  Chief Complaint  Patient presents with   uti symptoms    HPI  Adam Becker presents for urinary symptoms. He had burning once while using the bathroom of 08/09/22. No urinary symptoms since then. He would like to get the urine checked.   Denise any abdominal pain, hematuria, urinary hesitance, urinary frequency or urgency.   HPI   Past Medical History:  Diagnosis Date   1st degree AV block    seen on ekg   Asplenia 11/11/2014   Chronic kidney disease 11/2011   admission for [facility] cr 11, acidosis   Diabetes mellitus without complication (HCC)    Type II   GERD (gastroesophageal reflux disease)    Gout    Heart murmur    HIV disease (HCC) 06/21/2022   HIV infection (HCC) 1994   Hyperglycemia 04/04/2016   Hyperlipidemia    Hypertension    Impaired fasting glucose 07/27/2015   LLL pneumonia 07/06/2021   Myopathy 06/21/2021   Neuromuscular disorder (HCC)    peripheral neuropathy   Neuropathy    Umbilical hernia    Urethritis 02/11/2015    Past Surgical History:  Procedure Laterality Date   COLONOSCOPY     LIPOSUCTION HEAD / NECK  2001   posterior neck   MUSCLE BIOPSY Right 04/01/2021   Procedure: MUSCLE BIOPSY;  Surgeon: Earline Mayotte, MD;  Location: [facility] ORS;  Service: General;  Laterality: Right;  gastrocnemius; O.R. to notify pathology of procedure   SPLENECTOMY  2000   reason unclear "stopped working"    Family History  Problem Relation Age of Onset   Cancer Mother    Breast cancer Mother    Colon polyps Father    Colon cancer Father    Birth defects Son        Breast    Rectal cancer Neg Hx    Stomach cancer Neg Hx     Social History   Socioeconomic History   Marital status: Single    Spouse name: Not on file   Number of children: Not on file   Years of education: Not on file   Highest education  level: Not on file  Occupational History   Not on file  Tobacco Use   Smoking status: Never   Smokeless tobacco: Never  Vaping Use   Vaping status: Never Used  Substance and Sexual Activity   Alcohol use: No   Drug use: No   Sexual activity: Not Currently    Comment: declined condoms  Other Topics Concern   Not on file  Social History Narrative   Independent baseline. Lives by himself.  Education 12th.  Children none.  Caffeine tea one cup 4 x week.   Social Determinants of Health   Financial Resource Strain: Low Risk  (08/16/2021)   Overall Financial Resource Strain (CARDIA)    Difficulty of Paying Living Expenses: Not hard at all  Food Insecurity: No Food Insecurity (08/16/2021)   Hunger Vital Sign    Worried About Running Out of Food in the Last Year: Never true    Ran Out of Food in the Last Year: Never true  Transportation Needs: No Transportation Needs (12/18/2021)   Received from [facility], [facility]   PRAPARE - Transportation    In the past 12 months, has lack of transportation  kept you from medical appointments or from getting medications?: No    Lack of Transportation (Non-Medical): No  Physical Activity: Sufficiently Active (08/12/2020)   Exercise Vital Sign    Days of Exercise per Week: 7 days    Minutes of Exercise per Session: 30 min  Stress: No Stress Concern Present (08/16/2021)   [employer] of Occupational Health - Occupational Stress Questionnaire    Feeling of Stress : Not at all  Social Connections: Unknown (08/16/2021)   Social Connection and Isolation Panel [NHANES]    Frequency of Communication with Friends and Family: More than three times a week    Frequency of Social Gatherings with Friends and Family: More than three times a week    Attends Religious Services: More than 4 times per year    Active Member of [employer] or Organizations: Yes    Attends Banker Meetings: More than 4 times per year     Marital Status: Not on file  Intimate Partner Violence: Not At Risk (08/16/2021)   Humiliation, Afraid, Rape, and Kick questionnaire    Fear of Current or Ex-Partner: No    Emotionally Abused: No    Physically Abused: No    Sexually Abused: No     Outpatient Medications Prior to Visit  Medication Sig Dispense Refill   Accu-Chek Softclix Lancets lancets Use twice daily to check blood sugars  E11.69 100 each 12   acetaminophen (TYLENOL) 500 MG tablet Take 1,000 mg by mouth every 6 (six) hours as needed for moderate pain or headache.     allopurinol (ZYLOPRIM) 100 MG tablet TAKE 1 TABLET BY MOUTH EVERY DAY 90 tablet 3   aspirin EC 81 MG tablet Take 1 tablet (81 mg total) by mouth daily. 90 tablet 3   Bempedoic Acid (NEXLETOL) 180 MG TABS Take 1 tablet (180 mg total) by mouth daily. 90 tablet 3   Blood Glucose Monitoring Suppl (ACCU-CHEK GUIDE ME) w/Device KIT 1 Device by Does not apply route 2 (two) times daily. 1 kit 0   cholecalciferol (VITAMIN D3) 25 MCG (1000 UNIT) tablet Take 1,000 Units by mouth daily.     darunavir (PREZISTA) 800 MG tablet Take 1 tablet (800 mg total) by mouth daily. 30 tablet 11   elvitegravir-cobicistat-emtricitabine-tenofovir (GENVOYA) 150-150-200-10 MG TABS tablet TAKE 1 TABLET BY MOUTH EVERY DAY WITH BREAKFAST 30 tablet 11   empagliflozin (JARDIANCE) 10 MG TABS tablet TAKE 1 TABLET BY MOUTH EVERY DAY BEFORE BREAKFAST 90 tablet 3   ezetimibe (ZETIA) 10 MG tablet Take 1 tablet (10 mg total) by mouth daily. 90 tablet 3   famotidine (PEPCID) 10 MG tablet Take 10 mg by mouth 2 (two) times daily as needed for heartburn or indigestion.     gabapentin (NEURONTIN) 300 MG capsule TAKE 2 CAPSULES BY MOUTH 3 TIMES DAILY. 180 capsule 5   glucose blood (ACCU-CHEK GUIDE) test strip Use twice daily to check blood sugars  E11.69 100 each 12   glucose blood test strip Use to check blood sugars twice daily. ICD-10 E11.69 100 each 12   Lancets (ONETOUCH ULTRASOFT) lancets Use to  check blood sugars twice daily. ICD-10 E11.69 100 each 12   loratadine (CLARITIN) 10 MG tablet Take 10 mg by mouth daily as needed for allergies.     metFORMIN (GLUCOPHAGE-XR) 500 MG 24 hr tablet Take 1 tablet (500 mg total) by mouth daily with breakfast. 90 tablet 1   rOPINIRole (REQUIP) 1 MG tablet Take 1.5 mg by mouth 4 (four)  times daily.     telmisartan-hydrochlorothiazide (MICARDIS HCT) 80-12.5 MG tablet Take 1 tablet by mouth daily. 90 tablet 1   tiZANidine (ZANAFLEX) 4 MG tablet Take 1 tablet (4 mg total) by mouth every 6 (six) hours as needed for muscle spasms. 30 tablet 0   vitamin B-12 (CYANOCOBALAMIN) 1000 MCG tablet Take 1,000 mcg by mouth daily.     No facility-administered medications prior to visit.    Allergies  Allergen Reactions   Pravastatin Other (See Comments)    rhabdomyolysis    ROS Review of Systems Negative unless indicated in HPI.    Objective:    Physical Exam Constitutional:      Appearance: Normal appearance.  Cardiovascular:     Rate and Rhythm: Normal rate and regular rhythm.     Pulses: Normal pulses.     Heart sounds: Normal heart sounds.  Abdominal:     General: Bowel sounds are normal.     Palpations: Abdomen is soft.     Tenderness: There is no abdominal tenderness. There is no right CVA tenderness or left CVA tenderness.  Musculoskeletal:     Cervical back: Normal range of motion.  Neurological:     General: No focal deficit present.     Mental Status: He is alert. Mental status is at baseline.  Psychiatric:        Mood and Affect: Mood normal.        Behavior: Behavior normal.        Thought Content: Thought content normal.        Judgment: Judgment normal.     BP 122/82   Pulse 78   Temp 98 F (36.7 C) (Oral)   Ht 5\' 11"  (1.803 m)   Wt 236 lb 9.6 oz (107.3 kg)   SpO2 95%   BMI 33.00 kg/m  Wt Readings from Last 3 Encounters:  08/11/22 236 lb 9.6 oz (107.3 kg)  07/20/22 238 lb (108 kg)  06/21/22 240 lb (108.9 kg)      Health Maintenance  Topic Date Due   Meningococcal B Vaccine (1 of 4 - Increased Risk) Never done   OPHTHALMOLOGY EXAM  05/20/2021   COVID-19 Vaccine (8 - 2023-24 season) 08/16/2022   Medicare Annual Wellness (AWV)  08/17/2022   INFLUENZA VACCINE  08/24/2022   HEMOGLOBIN A1C  12/21/2022   Diabetic kidney evaluation - Urine ACR  05/16/2023   FOOT EXAM  05/16/2023   Diabetic kidney evaluation - eGFR measurement  06/07/2023   Pneumonia Vaccine 33+ Years old (3 of 3 - PPSV23 or PCV20) 09/22/2024   DTaP/Tdap/Td (2 - Td or Tdap) 10/22/2030   Colonoscopy  12/22/2030   Hepatitis C Screening  Completed   HIV Screening  Completed   Zoster Vaccines- Shingrix  Completed   HPV VACCINES  Aged Out    There are no preventive care reminders to display for this patient.  Lab Results  Component Value Date   TSH 1.06 04/29/2020   Lab Results  Component Value Date   WBC 7.1 06/07/2022   HGB 15.1 06/07/2022   HCT 43.6 06/07/2022   MCV 94.4 06/07/2022   PLT 390 06/07/2022   Lab Results  Component Value Date   NA 140 06/07/2022   K 4.3 06/07/2022   CO2 25 06/07/2022   GLUCOSE 108 (H) 06/07/2022   BUN 18 06/07/2022   CREATININE 1.36 (H) 06/07/2022   BILITOT 0.4 06/07/2022   ALKPHOS 51 04/25/2022   AST 39 (H) 06/07/2022   ALT  42 06/07/2022   PROT 8.2 (H) 06/07/2022   ALBUMIN 4.6 04/25/2022   CALCIUM 10.1 06/07/2022   ANIONGAP 9 06/02/2022   EGFR 58 (L) 06/07/2022   GFR 57.63 (L) 04/25/2022   Lab Results  Component Value Date   CHOL 193 06/07/2022   Lab Results  Component Value Date   HDL 43 06/07/2022   Lab Results  Component Value Date   LDLCALC 110 (H) 06/07/2022   Lab Results  Component Value Date   TRIG 303 (H) 06/07/2022   Lab Results  Component Value Date   CHOLHDL 4.5 06/07/2022   Lab Results  Component Value Date   HGBA1C 6.7 (A) 06/20/2022      Assessment & Plan:  UTI symptoms Assessment & Plan: Symptoms resolved at present. POCT urinalysis  negative for blood, nitrites or leukocytes but positive for glucose.  Orders: -     POCT urinalysis dipstick    Follow-up: No follow-ups on file.   Kara Dies, NP

## 2022-08-14 ENCOUNTER — Other Ambulatory Visit: Payer: Medicare HMO

## 2022-08-16 ENCOUNTER — Ambulatory Visit: Payer: Medicare HMO | Admitting: Internal Medicine

## 2022-08-21 ENCOUNTER — Other Ambulatory Visit: Payer: Self-pay | Admitting: Internal Medicine

## 2022-08-21 NOTE — Assessment & Plan Note (Signed)
Symptoms resolved at present. POCT urinalysis negative for blood, nitrites or leukocytes but positive for glucose.

## 2022-08-31 ENCOUNTER — Ambulatory Visit (INDEPENDENT_AMBULATORY_CARE_PROVIDER_SITE_OTHER): Payer: Medicare HMO | Admitting: Emergency Medicine

## 2022-08-31 VITALS — Ht 71.0 in | Wt 234.0 lb

## 2022-08-31 DIAGNOSIS — Z0000 Encounter for general adult medical examination without abnormal findings: Secondary | ICD-10-CM | POA: Diagnosis not present

## 2022-08-31 NOTE — Progress Notes (Addendum)
Subjective:   Adam Becker is a 67 y.o. male who presents for Medicare Annual/Subsequent preventive examination.  Visit Complete: Virtual  I connected with  Adam Becker on 08/31/22 by a audio enabled telemedicine application and verified that I am speaking with the correct person using two identifiers.  Patient Location: Home  Provider Location: Home Office  I discussed the limitations of evaluation and management by telemedicine. The patient expressed understanding and agreed to proceed.  Vital Signs: Unable to obtain new vitals due to this being a telehealth visit.   Review of Systems     Cardiac Risk Factors include: advanced age (>9men, >62 women);male gender;diabetes mellitus;hypertension;dyslipidemia;obesity (BMI >30kg/m2)     Objective:    Today's Vitals   08/31/22 1507  Weight: 234 lb (106.1 kg)  Height: 5\' 11"  (1.803 m)   Body mass index is 32.64 kg/m.     08/31/2022    3:19 PM 06/02/2022    7:23 AM 12/12/2021    3:04 PM 08/16/2021    2:24 PM 04/01/2021    6:28 AM 03/23/2021    3:34 PM 12/21/2020    9:21 AM  Advanced Directives  Does Patient Have a Medical Advance Directive? No No No No No No No  Would patient like information on creating a medical advance directive? No - Patient declined No - Patient declined No - Patient declined No - Patient declined No - Patient declined  No - Patient declined    Current Medications (verified) Outpatient Encounter Medications as of 08/31/2022  Medication Sig   Accu-Chek Softclix Lancets lancets Use twice daily to check blood sugars  E11.69   acetaminophen (TYLENOL) 500 MG tablet Take 1,000 mg by mouth every 6 (six) hours as needed for moderate pain or headache.   allopurinol (ZYLOPRIM) 100 MG tablet TAKE 1 TABLET BY MOUTH EVERY DAY   aspirin EC 81 MG tablet Take 1 tablet (81 mg total) by mouth daily.   Bempedoic Acid (NEXLETOL) 180 MG TABS Take 1 tablet (180 mg total) by mouth daily.   Blood Glucose Monitoring  Suppl (ACCU-CHEK GUIDE ME) w/Device KIT 1 Device by Does not apply route 2 (two) times daily.   cholecalciferol (VITAMIN D3) 25 MCG (1000 UNIT) tablet Take 1,000 Units by mouth daily.   darunavir (PREZISTA) 800 MG tablet Take 1 tablet (800 mg total) by mouth daily.   elvitegravir-cobicistat-emtricitabine-tenofovir (GENVOYA) 150-150-200-10 MG TABS tablet TAKE 1 TABLET BY MOUTH EVERY DAY WITH BREAKFAST   empagliflozin (JARDIANCE) 10 MG TABS tablet TAKE 1 TABLET BY MOUTH EVERY DAY BEFORE BREAKFAST   ezetimibe (ZETIA) 10 MG tablet Take 1 tablet (10 mg total) by mouth daily.   famotidine (PEPCID) 10 MG tablet Take 10 mg by mouth 2 (two) times daily as needed for heartburn or indigestion.   gabapentin (NEURONTIN) 300 MG capsule TAKE 2 CAPSULES BY MOUTH 3 TIMES DAILY.   glucose blood (ACCU-CHEK GUIDE) test strip Use twice daily to check blood sugars  E11.69   glucose blood test strip Use to check blood sugars twice daily. ICD-10 E11.69   Lancets (ONETOUCH ULTRASOFT) lancets Use to check blood sugars twice daily. ICD-10 E11.69   loratadine (CLARITIN) 10 MG tablet Take 10 mg by mouth daily as needed for allergies.   metFORMIN (GLUCOPHAGE-XR) 500 MG 24 hr tablet TAKE 1 TABLET BY MOUTH EVERY DAY WITH BREAKFAST   rOPINIRole (REQUIP) 1 MG tablet Take 1.5 mg by mouth 4 (four) times daily.   telmisartan-hydrochlorothiazide (MICARDIS HCT) 80-12.5 MG tablet Take  1 tablet by mouth daily.   vitamin B-12 (CYANOCOBALAMIN) 1000 MCG tablet Take 1,000 mcg by mouth daily.   tiZANidine (ZANAFLEX) 4 MG tablet Take 1 tablet (4 mg total) by mouth every 6 (six) hours as needed for muscle spasms. (Patient not taking: Reported on 08/31/2022)   No facility-administered encounter medications on file as of 08/31/2022.    Allergies (verified) Pravastatin   History: Past Medical History:  Diagnosis Date   1st degree AV block    seen on ekg   Asplenia 11/11/2014   Chronic kidney disease 11/2011   admission for [facility] cr 11,  acidosis   Diabetes mellitus without complication (HCC)    Type II   GERD (gastroesophageal reflux disease)    Gout    Heart murmur    HIV disease (HCC) 06/21/2022   HIV infection (HCC) 1994   Hyperglycemia 04/04/2016   Hyperlipidemia    Hypertension    Impaired fasting glucose 07/27/2015   LLL pneumonia 07/06/2021   Myopathy 06/21/2021   Neuromuscular disorder (HCC)    peripheral neuropathy   Neuropathy    Umbilical hernia    Urethritis 02/11/2015   Past Surgical History:  Procedure Laterality Date   COLONOSCOPY     LIPOSUCTION HEAD / NECK  2001   posterior neck   MUSCLE BIOPSY Right 04/01/2021   Procedure: MUSCLE BIOPSY;  Surgeon: Earline Mayotte, MD;  Location: [facility] ORS;  Service: General;  Laterality: Right;  gastrocnemius; O.R. to notify pathology of procedure   SPLENECTOMY  2000   reason unclear "stopped working"   Family History  Problem Relation Age of Onset   Cancer Mother    Breast cancer Mother    Colon polyps Father    Colon cancer Father    Birth defects Son        Breast    Rectal cancer Neg Hx    Stomach cancer Neg Hx    Social History   Socioeconomic History   Marital status: Single    Spouse name: Not on file   Number of children: 0   Years of education: Not on file   Highest education level: Not on file  Occupational History   Occupation: retired Games developer  Tobacco Use   Smoking status: Never   Smokeless tobacco: Never  Vaping Use   Vaping status: Never Used  Substance and Sexual Activity   Alcohol use: No   Drug use: No   Sexual activity: Not Currently    Comment: declined condoms  Other Topics Concern   Not on file  Social History Narrative   Independent baseline. Lives by himself.  Education 12th.  Children none.  Caffeine tea one cup 4 x week.   Social Determinants of Health   Financial Resource Strain: Low Risk  (08/31/2022)   Overall Financial Resource Strain (CARDIA)    Difficulty of Paying Living Expenses: Not hard at  all  Food Insecurity: No Food Insecurity (08/31/2022)   Hunger Vital Sign    Worried About Running Out of Food in the Last Year: Never true    Ran Out of Food in the Last Year: Never true  Transportation Needs: No Transportation Needs (08/31/2022)   PRAPARE - Administrator, Civil Service (Medical): No    Lack of Transportation (Non-Medical): No  Physical Activity: Sufficiently Active (08/31/2022)   Exercise Vital Sign    Days of Exercise per Week: 7 days    Minutes of Exercise per Session: 30 min  Stress: No  Stress Concern Present (08/31/2022)   [employer] of Occupational Health - Occupational Stress Questionnaire    Feeling of Stress : Not at all  Social Connections: Moderately Integrated (08/31/2022)   Social Connection and Isolation Panel [NHANES]    Frequency of Communication with Friends and Family: More than three times a week    Frequency of Social Gatherings with Friends and Family: Twice a week    Attends Religious Services: More than 4 times per year    Active Member of [employer] or Organizations: Yes    Attends Engineer, structural: More than 4 times per year    Marital Status: Never married    Tobacco Counseling Counseling given: Not Answered   Clinical Intake:  Pre-visit preparation completed: Yes  Pain : No/denies pain     BMI - recorded: 32.64 Nutritional Status: BMI > 30  Obese Nutritional Risks: None Diabetes: Yes CBG done?: No (FBS today 135 per patient) Did pt. bring in CBG monitor from home?: No  How often do you need to have someone help you when you read instructions, pamphlets, or other written materials from your doctor or pharmacy?: 1 - Never  Interpreter Needed?: No  Information entered by :: Tora Kindred, CMA   Activities of Daily Living    08/31/2022    3:10 PM  In your present state of health, do you have any difficulty performing the following activities:  Hearing? 0  Vision? 0  Difficulty concentrating or making  decisions? 0  Walking or climbing stairs? 0  Dressing or bathing? 0  Doing errands, shopping? 0  Preparing Food and eating ? N  Using the Toilet? N  In the past six months, have you accidently leaked urine? N  Do you have problems with loss of bowel control? N  Managing your Medications? N  Managing your Finances? N  Housekeeping or managing your Housekeeping? N    Patient Care Team: Sherlene Shams, MD as PCP - General (Internal Medicine) Daiva Eves, Lisette Grinder, MD as PCP - Infectious Diseases (Infectious Diseases)  Indicate any recent Medical Services you may have received from other than [facility] providers in the past year (date may be approximate).     Assessment:   This is a routine wellness examination for Maximas.  Hearing/Vision screen Hearing Screening - Comments:: No hearing loss  Dietary issues and exercise activities discussed:     Goals Addressed               This Visit's Progress     Weight (lb) < 205 lb (93 kg) (pt-stated)   234 lb (106.1 kg)     Would like to lose 30 lbs      Depression Screen    08/31/2022    3:17 PM 08/11/2022   11:34 AM 06/20/2022    3:19 PM 05/16/2022    4:14 PM 11/29/2021   11:29 AM 10/20/2021   10:13 AM 08/16/2021    1:37 PM  PHQ 2/9 Scores  PHQ - 2 Score 0 0 0 0 0 0 0  PHQ- 9 Score 0          Fall Risk    08/31/2022    3:20 PM 08/11/2022   11:35 AM 06/20/2022    3:19 PM 05/16/2022    4:14 PM 11/29/2021   11:28 AM  Fall Risk   Falls in the past year? 0 0 0 0 0  Number falls in past yr: 0 0 0 0   Injury with  Fall? 0 0 0 0   Risk for fall due to : No Fall Risks No Fall Risks No Fall Risks No Fall Risks No Fall Risks  Follow up Falls prevention discussed Falls evaluation completed Falls evaluation completed Falls evaluation completed Falls evaluation completed    MEDICARE RISK AT HOME:  Medicare Risk at Home - 08/31/22 1520     Any stairs in or around the home? Yes    If so, are there any without handrails? No    Home free  of loose throw rugs in walkways, pet beds, electrical cords, etc? Yes    Adequate lighting in your home to reduce risk of falls? Yes    Life alert? No    Use of a cane, walker or w/c? No    Grab bars in the bathroom? No    Shower chair or bench in shower? No    Elevated toilet seat or a handicapped toilet? Yes             TIMED UP AND GO:  Was the test performed?  No    Cognitive Function:    07/31/2017    9:01 AM 07/25/2016    3:17 PM  MMSE - Mini Mental State Exam  Orientation to time 5 5  Orientation to Place 5 5  Registration 3 3  Attention/ Calculation 5 5  Recall 3 3  Language- name 2 objects 2 2  Language- repeat 1 1  Language- follow 3 step command 3 3  Language- read & follow direction 1 1  Write a sentence 1 1  Copy design 1 1  Total score 30 30        08/31/2022    3:21 PM 08/12/2020    4:36 PM 08/11/2019    8:55 AM 08/08/2018    8:53 AM  6CIT Screen  What Year? 0 points 0 points 0 points 0 points  What month? 0 points 0 points 0 points 0 points  What time? 0 points 0 points 0 points 0 points  Count back from 20 0 points 0 points  0 points  Months in reverse 4 points 0 points  0 points  Repeat phrase 0 points 0 points  0 points  Total Score 4 points 0 points  0 points    Immunizations Immunization History  Administered Date(s) Administered   COVID-19, mRNA, vaccine(Comirnaty)12 years and older 06/21/2022   H1N1 01/02/2008   Hepatitis A 12/31/2006, 07/15/2007   Hepatitis B 03/26/2001, 05/06/2001, 10/14/2001   Influenza Inj Mdck Quad Pf 10/12/2017, 10/01/2021   Influenza Split 11/15/2010, 12/07/2011   Influenza Whole 10/02/2005, 10/15/2006, 11/26/2008, 11/01/2009   Influenza,inj,Quad PF,6+ Mos 10/07/2012, 11/05/2013, 11/11/2014, 10/12/2015, 10/10/2016, 10/24/2019, 10/11/2020   Influenza-Unspecified 09/28/2018, 09/27/2021   Meningococcal Conjugate 10/09/2019   Meningococcal polysaccharide vaccine (MPSV4) 10/12/2015   Moderna Covid-19 Vaccine Bivalent  Booster 47yrs & up 10/11/2020   PFIZER Comirnaty(Gray Top)Covid-19 Tri-Sucrose Vaccine 06/10/2020, 10/22/2021   PFIZER(Purple Top)SARS-COV-2 Vaccination 03/28/2019, 04/18/2019, 09/23/2019   Pneumococcal Conjugate-13 06/21/2011   Pneumococcal Polysaccharide-23 09/23/2005, 10/02/2005, 02/29/2012, 09/23/2019   Respiratory Syncytial Virus Vaccine,Recomb Aduvanted(Arexvy) 11/08/2021   Tdap 10/21/2020   Zoster Recombinant(Shingrix) 08/20/2020, 11/09/2020    TDAP status: Up to date  Flu Vaccine status: Due, Education has been provided regarding the importance of this vaccine. Advised may receive this vaccine at local pharmacy or Health Dept. Aware to provide a copy of the vaccination record if obtained from local pharmacy or Health Dept. Verbalized acceptance and understanding.  Pneumococcal vaccine status: Up  to date  Covid-19 vaccine status: Information provided on how to obtain vaccines.   Qualifies for Shingles Vaccine? Yes   Zostavax completed No   Shingrix Completed?: Yes  Screening Tests Health Maintenance  Topic Date Due   Meningococcal B Vaccine (1 of 4 - Increased Risk) Never done   OPHTHALMOLOGY EXAM  05/20/2021   COVID-19 Vaccine (8 - 2023-24 season) 08/16/2022   INFLUENZA VACCINE  08/24/2022   HEMOGLOBIN A1C  12/21/2022   Diabetic kidney evaluation - Urine ACR  05/16/2023   FOOT EXAM  05/16/2023   Diabetic kidney evaluation - eGFR measurement  06/07/2023   Medicare Annual Wellness (AWV)  08/31/2023   Pneumonia Vaccine 38+ Years old (3 of 3 - PPSV23 or PCV20) 09/22/2024   DTaP/Tdap/Td (2 - Td or Tdap) 10/22/2030   Colonoscopy  12/22/2030   Hepatitis C Screening  Completed   HIV Screening  Completed   Zoster Vaccines- Shingrix  Completed   HPV VACCINES  Aged Out    Health Maintenance  Health Maintenance Due  Topic Date Due   Meningococcal B Vaccine (1 of 4 - Increased Risk) Never done   OPHTHALMOLOGY EXAM  05/20/2021   COVID-19 Vaccine (8 - 2023-24 season)  08/16/2022   INFLUENZA VACCINE  08/24/2022    Colorectal cancer screening: Type of screening: Colonoscopy. Completed 12/21/20. Repeat every 5 years  Lung Cancer Screening: (Low Dose CT Chest recommended if Age 42-80 years, 20 pack-year currently smoking OR have quit w/in 15years.) does not qualify.   Lung Cancer Screening Referral: n/a  Additional Screening:  Hepatitis C Screening: does not qualify; Completed 10/22/13  Vision Screening: Recommended annual ophthalmology exams for early detection of glaucoma and other disorders of the eye. Is the patient up to date with their annual eye exam?  Yes per patient Who is the provider or what is the name of the office in which the patient attends annual eye exams? [facility] If pt is not established with a provider, would they like to be referred to a provider to establish care? No .   Dental Screening: Recommended annual dental exams for proper oral hygiene  Diabetic Foot Exam: Diabetic Foot Exam: Completed 05/16/22  Community Resource Referral / Chronic Care Management: CRR required this visit?  No   CCM required this visit?  No     Plan:     I have personally reviewed and noted the following in the patient's chart:   Medical and social history Use of alcohol, tobacco or illicit drugs  Current medications and supplements including opioid prescriptions. Patient is not currently taking opioid prescriptions. Functional ability and status Nutritional status Physical activity Advanced directives List of other physicians Hospitalizations, surgeries, and ER visits in previous 12 months Vitals Screenings to include cognitive, depression, and falls Referrals and appointments  In addition, I have reviewed and discussed with patient certain preventive protocols, quality metrics, and best practice recommendations. A written personalized care plan for preventive services as well as general preventive health recommendations  were provided to patient.     Tora Kindred, CMA   08/31/2022   After Visit Summary: (MyChart) Due to this being a telephonic visit, the after visit summary with patients personalized plan was offered to patient via MyChart   Nurse Notes:  6CIT score - 4 Patient is going to bring a copy of his eye exam this year from [facility]. Patient is going to consider going to [facility].   09/01/22 I  have reviewed the above information and agree with above.   Duncan Dull, MD

## 2022-08-31 NOTE — Patient Instructions (Addendum)
[name] , Thank you for taking time to come for your Medicare Wellness Visit. I appreciate your ongoing commitment to your health goals. Please review the following plan we discussed and let me know if I can assist you in the future.   Referrals/Orders/Follow-Ups/Clinician Recommendations: Please bring a copy of your most recent eye exam to Dr. Melina Schools office so that we can update your chart. Get your flu shot in the fall. Recommend Diabetes and Nutrition Education at [facility]. Just let us know so that a referral can be placed.  This is a list of the screening recommended for you and due dates:  Health Maintenance  Topic Date Due   Meningitis B Vaccine (1 of 4 - Increased Risk) Never done   Eye exam for diabetics  05/20/2021   COVID-19 Vaccine (8 - 2023-24 season) 08/16/2022   Flu Shot  08/24/2022   Hemoglobin A1C  12/21/2022   Yearly kidney health urinalysis for diabetes  05/16/2023   Complete foot exam   05/16/2023   Yearly kidney function blood test for diabetes  06/07/2023   Medicare Annual Wellness Visit  08/31/2023   Pneumonia Vaccine (3 of 3 - PPSV23 or PCV20) 09/22/2024   Colon Cancer Screening  12/21/2025   DTaP/Tdap/Td vaccine (2 - Td or Tdap) 10/22/2030   Hepatitis C Screening  Completed   HIV Screening  Completed   Zoster (Shingles) Vaccine  Completed   HPV Vaccine  Aged Out    Advanced directives: (Declined) Advance directive discussed with you today. Even though you declined this today, please call our office should you change your mind, and we can give you the proper paperwork for you to fill out.  Next Medicare Annual Wellness Visit scheduled for next year: Yes, 09/06/23 @ 3pm  Preventive Care 65 Years and Older, Male  Preventive care refers to lifestyle choices and visits with your health care provider that can promote health and wellness. What does preventive care include? A yearly physical exam. This is also called an annual well check. Dental exams once or twice  a year. Routine eye exams. Ask your health care provider how often you should have your eyes checked. Personal lifestyle choices, including: Daily care of your teeth and gums. Regular physical activity. Eating a healthy diet. Avoiding tobacco and drug use. Limiting alcohol use. Practicing safe sex. Taking low doses of aspirin every day. Taking vitamin and mineral supplements as recommended by your health care provider. What happens during an annual well check? The services and screenings done by your health care provider during your annual well check will depend on your age, overall health, lifestyle risk factors, and family history of disease. Counseling  Your health care provider may ask you questions about your: Alcohol use. Tobacco use. Drug use. Emotional well-being. Home and relationship well-being. Sexual activity. Eating habits. History of falls. Memory and ability to understand (cognition). Work and work Astronomer. Screening  You may have the following tests or measurements: Height, weight, and BMI. Blood pressure. Lipid and cholesterol levels. These may be checked every 5 years, or more frequently if you are over 66 years old. Skin check. Lung cancer screening. You may have this screening every year starting at age 37 if you have a 30-pack-year history of smoking and currently smoke or have quit within the past 15 years. Fecal occult blood test (FOBT) of the stool. You may have this test every year starting at age 26. Flexible sigmoidoscopy or colonoscopy. You may have a sigmoidoscopy every 5  years or a colonoscopy every 10 years starting at age 35. Prostate cancer screening. Recommendations will vary depending on your family history and other risks. Hepatitis C blood test. Hepatitis B blood test. Sexually transmitted disease (STD) testing. Diabetes screening. This is done by checking your blood sugar (glucose) after you have not eaten for a while (fasting). You may  have this done every 1-3 years. Abdominal aortic aneurysm (AAA) screening. You may need this if you are a current or former smoker. Osteoporosis. You may be screened starting at age 24 if you are at high risk. Talk with your health care provider about your test results, treatment options, and if necessary, the need for more tests. Vaccines  Your health care provider may recommend certain vaccines, such as: Influenza vaccine. This is recommended every year. Tetanus, diphtheria, and acellular pertussis (Tdap, Td) vaccine. You may need a Td booster every 10 years. Zoster vaccine. You may need this after age 42. Pneumococcal 13-valent conjugate (PCV13) vaccine. One dose is recommended after age 79. Pneumococcal polysaccharide (PPSV23) vaccine. One dose is recommended after age 55. Talk to your health care provider about which screenings and vaccines you need and how often you need them. This information is not intended to replace advice given to you by your health care provider. Make sure you discuss any questions you have with your health care provider. Document Released: 02/05/2015 Document Revised: 09/29/2015 Document Reviewed: 11/10/2014 Elsevier Interactive Patient Education  2017 [employer].  Fall Prevention in the Home Falls can cause injuries. They can happen to people of all ages. There are many things you can do to make your home safe and to help prevent falls. What can I do on the outside of my home? Regularly fix the edges of walkways and driveways and fix any cracks. Remove anything that might make you trip as you walk through a door, such as a raised step or threshold. Trim any bushes or trees on the path to your home. Use bright outdoor lighting. Clear any walking paths of anything that might make someone trip, such as rocks or tools. Regularly check to see if handrails are loose or broken. Make sure that both sides of any steps have handrails. Any raised decks and porches  should have guardrails on the edges. Have any leaves, snow, or ice cleared regularly. Use sand or salt on walking paths during winter. Clean up any spills in your garage right away. This includes oil or grease spills. What can I do in the bathroom? Use night lights. Install grab bars by the toilet and in the tub and shower. Do not use towel bars as grab bars. Use non-skid mats or decals in the tub or shower. If you need to sit down in the shower, use a plastic, non-slip stool. Keep the floor dry. Clean up any water that spills on the floor as soon as it happens. Remove soap buildup in the tub or shower regularly. Attach bath mats securely with double-sided non-slip rug tape. Do not have throw rugs and other things on the floor that can make you trip. What can I do in the bedroom? Use night lights. Make sure that you have a light by your bed that is easy to reach. Do not use any sheets or blankets that are too big for your bed. They should not hang down onto the floor. Have a firm chair that has side arms. You can use this for support while you get dressed. Do not have throw rugs  and other things on the floor that can make you trip. What can I do in the kitchen? Clean up any spills right away. Avoid walking on wet floors. Keep items that you use a lot in easy-to-reach places. If you need to reach something above you, use a strong step stool that has a grab bar. Keep electrical cords out of the way. Do not use floor polish or wax that makes floors slippery. If you must use wax, use non-skid floor wax. Do not have throw rugs and other things on the floor that can make you trip. What can I do with my stairs? Do not leave any items on the stairs. Make sure that there are handrails on both sides of the stairs and use them. Fix handrails that are broken or loose. Make sure that handrails are as long as the stairways. Check any carpeting to make sure that it is firmly attached to the stairs.  Fix any carpet that is loose or worn. Avoid having throw rugs at the top or bottom of the stairs. If you do have throw rugs, attach them to the floor with carpet tape. Make sure that you have a light switch at the top of the stairs and the bottom of the stairs. If you do not have them, ask someone to add them for you. What else can I do to help prevent falls? Wear shoes that: Do not have high heels. Have rubber bottoms. Are comfortable and fit you well. Are closed at the toe. Do not wear sandals. If you use a stepladder: Make sure that it is fully opened. Do not climb a closed stepladder. Make sure that both sides of the stepladder are locked into place. Ask someone to hold it for you, if possible. Clearly mark and make sure that you can see: Any grab bars or handrails. First and last steps. Where the edge of each step is. Use tools that help you move around (mobility aids) if they are needed. These include: Canes. Walkers. Scooters. Crutches. Turn on the lights when you go into a dark area. Replace any light bulbs as soon as they burn out. Set up your furniture so you have a clear path. Avoid moving your furniture around. If any of your floors are uneven, fix them. If there are any pets around you, be aware of where they are. Review your medicines with your doctor. Some medicines can make you feel dizzy. This can increase your chance of falling. Ask your doctor what other things that you can do to help prevent falls. This information is not intended to replace advice given to you by your health care provider. Make sure you discuss any questions you have with your health care provider. Document Released: 11/05/2008 Document Revised: 06/17/2015 Document Reviewed: 02/13/2014 Elsevier Interactive Patient Education  2017 [employer].

## 2022-09-21 ENCOUNTER — Other Ambulatory Visit: Payer: Self-pay | Admitting: Internal Medicine

## 2022-09-21 DIAGNOSIS — E669 Obesity, unspecified: Secondary | ICD-10-CM

## 2022-09-27 DIAGNOSIS — Z950 Presence of cardiac pacemaker: Secondary | ICD-10-CM | POA: Diagnosis not present

## 2022-09-27 DIAGNOSIS — I442 Atrioventricular block, complete: Secondary | ICD-10-CM | POA: Diagnosis not present

## 2022-09-27 DIAGNOSIS — Z45018 Encounter for adjustment and management of other part of cardiac pacemaker: Secondary | ICD-10-CM | POA: Diagnosis not present

## 2022-09-27 DIAGNOSIS — I10 Essential (primary) hypertension: Secondary | ICD-10-CM | POA: Diagnosis not present

## 2022-10-04 DIAGNOSIS — Z45018 Encounter for adjustment and management of other part of cardiac pacemaker: Secondary | ICD-10-CM | POA: Diagnosis not present

## 2022-10-04 DIAGNOSIS — I442 Atrioventricular block, complete: Secondary | ICD-10-CM | POA: Diagnosis not present

## 2022-11-20 ENCOUNTER — Other Ambulatory Visit: Payer: Self-pay | Admitting: Internal Medicine

## 2022-11-20 ENCOUNTER — Telehealth: Payer: Self-pay | Admitting: Internal Medicine

## 2022-11-20 DIAGNOSIS — G629 Polyneuropathy, unspecified: Secondary | ICD-10-CM

## 2022-11-20 MED ORDER — GABAPENTIN 300 MG PO CAPS
ORAL_CAPSULE | ORAL | 5 refills | Status: DC
Start: 2022-11-20 — End: 2023-01-22

## 2022-11-20 NOTE — Telephone Encounter (Signed)
MEDICATION REFILLED

## 2022-11-20 NOTE — Telephone Encounter (Signed)
Prescription Request  11/20/2022  LOV: 07/20/2022  What is the name of the medication or equipment?  gabapentin (NEURONTIN) 300 MG capsule   Have you contacted your pharmacy to request a refill? Yes   Which pharmacy would you like this sent to?  [id number] - [place], [place] - [address] [place] [address] Phone: [phone] Fax: [phone]      Patient notified that their request is being sent to the clinical staff for review and that they should receive a response within 2 business days.   Please advise at [facility] [phone]

## 2022-12-06 ENCOUNTER — Ambulatory Visit: Payer: Medicare HMO | Attending: Cardiology

## 2022-12-06 DIAGNOSIS — I350 Nonrheumatic aortic (valve) stenosis: Secondary | ICD-10-CM

## 2022-12-06 LAB — ECHOCARDIOGRAM COMPLETE
AR max vel: 2 cm2
AV Area VTI: 2 cm2
AV Area mean vel: 1.96 cm2
AV Mean grad: 14.4 mm[Hg]
AV Peak grad: 26.9 mm[Hg]
Ao pk vel: 2.6 m/s
Area-P 1/2: 3.85 cm2
P 1/2 time: 376 ms
S' Lateral: 3.9 cm

## 2022-12-06 MED ORDER — PERFLUTREN LIPID MICROSPHERE
1.0000 mL | INTRAVENOUS | Status: AC | PRN
Start: 2022-12-06 — End: 2022-12-06
  Administered 2022-12-06: 2 mL via INTRAVENOUS

## 2022-12-08 ENCOUNTER — Other Ambulatory Visit: Payer: Self-pay

## 2022-12-08 ENCOUNTER — Telehealth: Payer: Self-pay | Admitting: Cardiology

## 2022-12-08 ENCOUNTER — Telehealth: Payer: Self-pay

## 2022-12-08 MED ORDER — CARVEDILOL 6.25 MG PO TABS: 6.2500 mg | ORAL_TABLET | Freq: Two times a day (BID) | ORAL | 3 refills | Status: DC

## 2022-12-08 MED ORDER — ENTRESTO 24-26 MG PO TABS: 1.0000 | ORAL_TABLET | Freq: Two times a day (BID) | ORAL | 0 refills | Status: DC

## 2022-12-08 NOTE — Telephone Encounter (Signed)
Margrett Rud, [place] 12/08/2022 10:42 AM EST Back to Top    Called patient. No answer. LMOV to call back.   Debbe Odea, MD 12/08/2022  8:12 AM EST     Echocardiogram shows mild to moderately reduced ejection fraction.  We will need to evaluate for presence of coronary artery disease.  Schedule follow-up appointment, patient will need ischemic workup with right and left heart cath.  Stop telmisartan-HCTZ/Micardis.  Start Coreg 6.25 mg twice daily, Entresto 24-26 mg twice daily.

## 2022-12-08 NOTE — Telephone Encounter (Signed)
Spoke with patient.  Reviewed results as followed -   Echocardiogram shows mild to moderately reduced ejection fraction.  We will need to evaluate for presence of coronary artery disease.  Schedule follow-up appointment, patient will need ischemic workup with right and left heart cath.  Stop telmisartan-HCTZ/Micardis.  Start Coreg 6.25 mg twice daily, Entresto 24-26 mg twice daily.   Medication list updated and patient scheduled for Monday - 12/11/2022 @ 8:25am with NP

## 2022-12-08 NOTE — Telephone Encounter (Signed)
Pt returning call to nurse for results

## 2022-12-08 NOTE — Telephone Encounter (Signed)
Duplicate encounter.  Please see other encounter for full conversation.

## 2022-12-10 NOTE — Progress Notes (Unsigned)
Cardiology Clinic Note   Date: 12/11/2022 ID: KHALIQ BURGGRAF, DOB 1955-04-20, MRN [id number]  Primary Cardiologist:  Debbe Odea, MD  Patient Profile    Adam Becker is a 67 y.o. male who presents to the clinic today for follow up after echo.     Past medical history significant for: Complete heart block. PPM implantation 12/19/2021 performed at [facility]. RA lead revision 12/20/2021. HFmrEF. Echo 12/06/2022: EF 40 to 45%.  Severe hypokinesis of the mid to distal anterior, anteroseptal and apical region.  Mild LVH.  Grade I DD.  Normal RV function.  Mild LAE.  Mild MR.  Moderate calcification of the aortic valve, mild AI/AS, mean gradient 14.4 mmHg. Aortic stenosis. Hypertension. Hyperlipidemia. Lipid panel 06/07/2022: LDL 110, HDL 43, TG 303, total 193. TIA.   Neurosyphilis. OSA. CKD stage II. HIV. T2DM.  In summary, patient was first evaluated by Dr. Azucena Cecil on 12/05/2021 for aortic stenosis at the request of Dr. Darrick Huntsman.  Echocardiogram to evaluate cardiac murmur ordered by PCP which showed low normal LV function, no RWMA, Grade II DD, mild to moderate AS/AI, mean gradient 13 mmHg.  He reported a history of rhabdomyolysis with statins and was only taking Zetia at that time.  He was started on bempedoic acid.  Plan to repeat echo in 2024.  Patient presented to the [facility] health ED on 12/17/2021 with complaints of syncope.  He was found to be in complete heart block and underwent PPM implantation on 12/19/2021 and lead revision on 12/20/2021.  Repeat echo 12/06/2022 demonstrated mildly reduced LV function as detailed above.  Patient was instructed to stop Micardis and begin carvedilol and Entresto.     History of Present Illness    Adam Becker is followed by Dr. Azucena Cecil for the above outlined history.  Discussed the use of AI scribe software for clinical note transcription with the patient, who gave verbal consent to  proceed.  The patient presents alone for follow-up after a recent echocardiogram. He started carvedilol and Entresto with good tolerance. Patient denies shortness of breath, dyspnea on exertion, lower extremity edema, orthopnea or PND. No chest pain, pressure, or tightness. No palpitations.  He reports occasional positional lightheadedness particularly when going from bending over with head down to standing. Discussed R/LHC. All questions answered. He is in agreement to proceed.        ROS: All other systems reviewed and are otherwise negative except as noted in History of Present Illness.  Studies Reviewed    EKG Interpretation Date/Time:  Monday December 11 2022 08:18:38 EST Ventricular Rate:  67 PR Interval:  188 QRS Duration:  158 QT Interval:  456 QTC Calculation: 481 R Axis:   -49  Text Interpretation: Sinus rhythm with occasional Premature ventricular complexes Left axis deviation Left bundle branch block LAFB When compared with ECG of 12-Dec-2021 13:18, Premature ventricular complexes are now Present PR interval has decreased Left bundle branch block has replaced Right bundle branch block Confirmed by Carlos Levering [phone]) on 12/11/2022 8:32:17 AM           Physical Exam    VS:  BP 116/68 (BP Location: Right Arm, Patient Position: Sitting)   Pulse 66   Ht 5\' 11"  (1.803 m)   Wt 233 lb 12.8 oz (106.1 kg)   SpO2 99%   BMI 32.61 kg/m  , BMI Body mass index is 32.61 kg/m.  GEN: Well nourished, well developed, in no acute distress. Neck: No JVD or carotid bruits.  Cardiac:  RRR. No murmurs. No rubs or gallops.   Respiratory:  Respirations regular and unlabored. Clear to auscultation without rales, wheezing or rhonchi. GI: Soft, nontender, nondistended. Extremities: Radials/DP/PT 2+ and equal bilaterally. No clubbing or cyanosis. No edema.  Skin: Warm and dry, no rash. Neuro: Strength intact.  Assessment & Plan      HFmrEF Echo November 2024 showed mildly reduced  LV function, severe hypokinesis of the mid to distal anterior, anteroseptal and apical region, Grade I DD.  Patient denies lower extremity edema, abdominal fullness or bloating, or dyspnea. He does not weigh at home because his scale is broken. Suggested getting a new scale and begin weighing daily. Euvolemic and well compensated on exam. -Schedule R/LHC. -Continue carvedilol, Entresto, Jardiance. -CBC and BMP today.   Complete heart block S/p PPM implantation November 2023 performed at [facility] in the setting of syncope. -Continue to follow with [facility] EP.  Hypertension BP today 116/68. He reports occasional, mild lightheadedness with position changes, particularly with bending over to standing. No dizziness.  -Continue carvedilol, Entresto.  Hyperlipidemia LDL May 2024 110.  Patient has a history of rhabdomyolysis on statins. -Continue Zetia and Nexletol.        Disposition: R/LHC scheduled for 11/22. CBC and BMP today. Return in 2 weeks or sooner as needed.      Informed Consent   Shared Decision Making/Informed Consent The risks [stroke (1 in 1000), death (1 in 1000), kidney failure [usually temporary] (1 in 500), bleeding (1 in 200), allergic reaction [possibly serious] (1 in 200)], benefits (diagnostic support and management of coronary artery disease) and alternatives of a cardiac catheterization were discussed in detail with Mr. Ziehm and he is willing to proceed.      Signed, Etta Grandchild. Michelle Vanhise, DNP, NP-C

## 2022-12-10 NOTE — H&P (View-Only) (Signed)
Cardiology Clinic Note   Date: 12/11/2022 ID: Adam Becker, DOB [birth year], MRN [id number]  Primary Cardiologist:  Debbe Odea, MD  Patient Profile    Adam Becker is a 67 y.o. male who presents to the clinic today for follow up after echo.     Past medical history significant for: Complete heart block. PPM implantation 12/19/2021 performed at [facility]. RA lead revision 12/20/2021. HFmrEF. Echo 12/06/2022: EF 40 to 45%.  Severe hypokinesis of the mid to distal anterior, anteroseptal and apical region.  Mild LVH.  Grade I DD.  Normal RV function.  Mild LAE.  Mild MR.  Moderate calcification of the aortic valve, mild AI/AS, mean gradient 14.4 mmHg. Aortic stenosis. Hypertension. Hyperlipidemia. Lipid panel 06/07/2022: LDL 110, HDL 43, TG 303, total 193. TIA.   Neurosyphilis. OSA. CKD stage II. HIV. T2DM.  In summary, patient was first evaluated by Dr. Azucena Cecil on 12/05/2021 for aortic stenosis at the request of Dr. Darrick Huntsman.  Echocardiogram to evaluate cardiac murmur ordered by PCP which showed low normal LV function, no RWMA, Grade II DD, mild to moderate AS/AI, mean gradient 13 mmHg.  He reported a history of rhabdomyolysis with statins and was only taking Zetia at that time.  He was started on bempedoic acid.  Plan to repeat echo in 2024.  Patient presented to the [facility] health ED on 12/17/2021 with complaints of syncope.  He was found to be in complete heart block and underwent PPM implantation on 12/19/2021 and lead revision on 12/20/2021.  Repeat echo 12/06/2022 demonstrated mildly reduced LV function as detailed above.  Patient was instructed to stop Micardis and begin carvedilol and Entresto.     History of Present Illness    Adam Becker is followed by Dr. Azucena Cecil for the above outlined history.  Discussed the use of AI scribe software for clinical note transcription with the patient, who gave verbal consent to  proceed.  The patient presents alone for follow-up after a recent echocardiogram. He started carvedilol and Entresto with good tolerance. Patient denies shortness of breath, dyspnea on exertion, lower extremity edema, orthopnea or PND. No chest pain, pressure, or tightness. No palpitations.  He reports occasional positional lightheadedness particularly when going from bending over with head down to standing. Discussed R/LHC. All questions answered. He is in agreement to proceed.        ROS: All other systems reviewed and are otherwise negative except as noted in History of Present Illness.  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  GEN: Well nourished, well developed, in no acute distress. Neck: No JVD or carotid bruits.  Cardiac:  RRR. No murmurs. No rubs or gallops.   Respiratory:  Respirations regular and unlabored. Clear to auscultation without rales, wheezing or rhonchi. GI: Soft, nontender, nondistended. Extremities: Radials/DP/PT 2+ and equal bilaterally. No clubbing or cyanosis. No edema.  Skin: Warm and dry, no rash. Neuro: Strength intact.  Assessment & Plan      HFmrEF Echo November 2024 showed mildly reduced  LV function, severe hypokinesis of the mid to distal anterior, anteroseptal and apical region, Grade I DD.  Patient denies lower extremity edema, abdominal fullness or bloating, or dyspnea. He does not weigh at home because his scale is broken. Suggested getting a new scale and begin weighing daily. Euvolemic and well compensated on exam. -Schedule R/LHC. -Continue carvedilol, Entresto, Jardiance. -CBC and BMP today.   Complete heart block S/p PPM implantation November 2023 performed at [facility] in the setting of syncope. -Continue to follow with [facility] EP.  Hypertension BP today 116/68. He reports occasional, mild lightheadedness with position changes, particularly with bending over to standing. No dizziness.  -Continue carvedilol, Entresto.  Hyperlipidemia LDL May 2024 110.  Patient has a history of rhabdomyolysis on statins. -Continue Zetia and Nexletol.        Disposition: R/LHC scheduled for 11/22. CBC and BMP today. Return in 2 weeks or sooner as needed.      Informed Consent   Shared Decision Making/Informed Consent The risks [stroke (1 in 1000), death (1 in 1000), kidney failure [usually temporary] (1 in 500), bleeding (1 in 200), allergic reaction [possibly serious] (1 in 200)], benefits (diagnostic support and management of coronary artery disease) and alternatives of a cardiac catheterization were discussed in detail with Mr. Depass and he is willing to proceed.      Signed, Etta Grandchild. Muskaan Smet, DNP, NP-C

## 2022-12-11 ENCOUNTER — Encounter: Payer: Self-pay | Admitting: Student

## 2022-12-11 ENCOUNTER — Ambulatory Visit: Payer: Medicare HMO | Attending: Student | Admitting: Student

## 2022-12-11 VITALS — BP 116/68 | HR 66 | Ht 71.0 in | Wt 233.8 lb

## 2022-12-11 DIAGNOSIS — I442 Atrioventricular block, complete: Secondary | ICD-10-CM

## 2022-12-11 DIAGNOSIS — I10 Essential (primary) hypertension: Secondary | ICD-10-CM

## 2022-12-11 DIAGNOSIS — I5022 Chronic systolic (congestive) heart failure: Secondary | ICD-10-CM | POA: Diagnosis not present

## 2022-12-11 DIAGNOSIS — I350 Nonrheumatic aortic (valve) stenosis: Secondary | ICD-10-CM | POA: Diagnosis not present

## 2022-12-11 DIAGNOSIS — E785 Hyperlipidemia, unspecified: Secondary | ICD-10-CM | POA: Diagnosis not present

## 2022-12-11 NOTE — Patient Instructions (Signed)
Medication Instructions:  Follow procedure medication instructions.   *If you need a refill on your cardiac medications before your next appointment, please call your pharmacy*   Lab Work: Your provider would like for you to have following labs drawn today BMET, CBC.   If you have labs (blood work) drawn today and your tests are completely normal, you will receive your results only by: MyChart Message (if you have MyChart) OR A paper copy in the mail If you have any lab test that is abnormal or we need to change your treatment, we will call you to review the results.   Testing/Procedures: Your physician has requested that you have a cardiac catheterization. Cardiac catheterization is used to diagnose and/or treat various heart conditions. Doctors may recommend this procedure for a number of different reasons. The most common reason is to evaluate chest pain. Chest pain can be a symptom of coronary artery disease (CAD), and cardiac catheterization can show whether plaque is narrowing or blocking your heart's arteries. This procedure is also used to evaluate the valves, as well as measure the blood flow and oxygen levels in different parts of your heart. For further information please visit [email or web address] Please follow instruction sheet, as given.    Follow-Up: At [facility], you and your health needs are our priority.  As part of our continuing mission to provide you with exceptional heart care, we have created designated Provider Care Teams.  These Care Teams include your primary Cardiologist (physician) and Advanced Practice Providers (APPs -  Physician Assistants and Nurse Practitioners) who all work together to provide you with the care you need, when you need it.  We recommend signing up for the patient portal called "MyChart".  Sign up information is provided on this After Visit Summary.  MyChart is used to connect with patients for Virtual Visits (Telemedicine).   Patients are able to view lab/test results, encounter notes, upcoming appointments, etc.  Non-urgent messages can be sent to your provider as well.   To learn more about what you can do with MyChart, go to [email or web address].    Your next appointment:   2 week(s) post heart cath  Provider:   You may see Debbe Odea, MD or one of the following Advanced Practice Providers on your designated Care Team:   Nicolasa Ducking, NP Eula Listen, PA-C Cadence Fransico Michael, PA-C Charlsie Quest, NP Carlos Levering, NP   Other Instructions       Cardiac/Peripheral Catheterization   You are scheduled for a Cardiac Catheterization on Friday, November 22 with Dr. Cristal Deer End.  1. Please arrive at the [facility] Entrance of [facility], [address], [place] [address] at 9:30 AM (This is 1 hour(s) prior to your procedure time).  Proceed to the Check-In Desk directly inside the entrance.  Procedure Parking: Use the entrance off of the [address] side of the hospital. Turn right upon entering and follow the driveway to parking that is directly in front of the Heart & Vascular Center. There is no valet parking available at this entrance, however there is an awning directly in front of the Heart & Vascular Center for drop off/ pick up for patients.      Special note: Every effort is made to have your procedure done on time. Please understand that emergencies sometimes delay scheduled procedures.  2. Diet: Do not eat solid foods after midnight.  You may have clear liquids until 5 AM the day of the procedure.  3.  Labs: You will need to have blood drawn today- CBC, BMET. You do not need to be fasting.  4. Medication instructions in preparation for your procedure:   Contrast Allergy: No  Hold Jardiance 3 days prior to your procedure.  Do not take Diabetes Med Glucophage (Metformin) on the day of the procedure and HOLD 48 HOURS AFTER THE PROCEDURE.  On the morning of your  procedure, take Aspirin 81 mg and any morning medicines NOT listed above.  You may use sips of water.  5. Plan to go home the same day, you will only stay overnight if medically necessary. 6. You MUST have a responsible adult to drive you home. 7. An adult MUST be with you the first 24 hours after you arrive home. 8. Bring a current list of your medications, and the last time and date medication taken. 9. Bring ID and current insurance cards. 10.Please wear clothes that are easy to get on and off and wear slip-on shoes.  Thank you for allowing [place] to care for you!   -- [facility]

## 2022-12-12 LAB — CBC
Hematocrit: 44.3 % (ref 37.5–51.0)
Hemoglobin: 14.6 g/dL (ref 13.0–17.7)
MCH: 32.7 pg (ref 26.6–33.0)
MCHC: 33 g/dL (ref 31.5–35.7)
MCV: 99 fL — ABNORMAL HIGH (ref 79–97)
Platelets: 409 10*3/uL (ref 150–450)
RBC: 4.47 x10E6/uL (ref 4.14–5.80)
RDW: 12.4 % (ref 11.6–15.4)
WBC: 6.3 10*3/uL (ref 3.4–10.8)

## 2022-12-12 LAB — BASIC METABOLIC PANEL
BUN/Creatinine Ratio: 17 (ref 10–24)
BUN: 22 mg/dL (ref 8–27)
CO2: 25 mmol/L (ref 20–29)
Calcium: 9.4 mg/dL (ref 8.6–10.2)
Chloride: 104 mmol/L (ref 96–106)
Creatinine, Ser: 1.32 mg/dL — ABNORMAL HIGH (ref 0.76–1.27)
Glucose: 159 mg/dL — ABNORMAL HIGH (ref 70–99)
Potassium: 5.1 mmol/L (ref 3.5–5.2)
Sodium: 142 mmol/L (ref 134–144)
eGFR: 59 mL/min/{1.73_m2} — ABNORMAL LOW (ref >59)

## 2022-12-15 ENCOUNTER — Ambulatory Visit
Admission: RE | Admit: 2022-12-15 | Discharge: 2022-12-15 | Disposition: A | Discharge status: Home / Self Care | Payer: Medicare HMO | Source: Ambulatory Visit | Attending: Internal Medicine | Admitting: Internal Medicine

## 2022-12-15 ENCOUNTER — Other Ambulatory Visit: Payer: Self-pay

## 2022-12-15 ENCOUNTER — Telehealth: Payer: Self-pay | Admitting: Cardiology

## 2022-12-15 ENCOUNTER — Encounter
Admission: RE | Disposition: A | Discharge status: Home / Self Care | Payer: Self-pay | Source: Ambulatory Visit | Attending: Internal Medicine

## 2022-12-15 DIAGNOSIS — Z79899 Other long term (current) drug therapy: Secondary | ICD-10-CM | POA: Insufficient documentation

## 2022-12-15 DIAGNOSIS — I428 Other cardiomyopathies: Secondary | ICD-10-CM | POA: Diagnosis not present

## 2022-12-15 DIAGNOSIS — I442 Atrioventricular block, complete: Secondary | ICD-10-CM | POA: Insufficient documentation

## 2022-12-15 DIAGNOSIS — I2510 Atherosclerotic heart disease of native coronary artery without angina pectoris: Secondary | ICD-10-CM | POA: Insufficient documentation

## 2022-12-15 DIAGNOSIS — Z950 Presence of cardiac pacemaker: Secondary | ICD-10-CM | POA: Insufficient documentation

## 2022-12-15 DIAGNOSIS — I130 Hypertensive heart and chronic kidney disease with heart failure and stage 1 through stage 4 chronic kidney disease, or unspecified chronic kidney disease: Secondary | ICD-10-CM | POA: Insufficient documentation

## 2022-12-15 DIAGNOSIS — N182 Chronic kidney disease, stage 2 (mild): Secondary | ICD-10-CM | POA: Insufficient documentation

## 2022-12-15 DIAGNOSIS — I5022 Chronic systolic (congestive) heart failure: Secondary | ICD-10-CM | POA: Diagnosis not present

## 2022-12-15 DIAGNOSIS — I509 Heart failure, unspecified: Secondary | ICD-10-CM

## 2022-12-15 DIAGNOSIS — I350 Nonrheumatic aortic (valve) stenosis: Secondary | ICD-10-CM | POA: Diagnosis not present

## 2022-12-15 DIAGNOSIS — I5020 Unspecified systolic (congestive) heart failure: Secondary | ICD-10-CM

## 2022-12-15 DIAGNOSIS — E785 Hyperlipidemia, unspecified: Secondary | ICD-10-CM | POA: Insufficient documentation

## 2022-12-15 HISTORY — PX: RIGHT/LEFT HEART CATH AND CORONARY ANGIOGRAPHY: CATH118266

## 2022-12-15 LAB — POCT I-STAT 7, (LYTES, BLD GAS, ICA,H+H)
Acid-base deficit: 3 mmol/L — ABNORMAL HIGH (ref 0.0–2.0)
Bicarbonate: 22.9 mmol/L (ref 20.0–28.0)
Calcium, Ion: 1.17 mmol/L (ref 1.15–1.40)
HCT: 43 % (ref 39.0–52.0)
Hemoglobin: 14.6 g/dL (ref 13.0–17.0)
O2 Saturation: 94 %
Potassium: 4.2 mmol/L (ref 3.5–5.1)
Sodium: 140 mmol/L (ref 135–145)
TCO2: 24 mmol/L (ref 22–32)
pCO2 arterial: 40.9 mm[Hg] (ref 32–48)
pH, Arterial: 7.355 (ref 7.35–7.45)
pO2, Arterial: 75 mm[Hg] — ABNORMAL LOW (ref 83–108)

## 2022-12-15 LAB — POCT I-STAT EG7
Acid-base deficit: 3 mmol/L — ABNORMAL HIGH (ref 0.0–2.0)
Bicarbonate: 24.1 mmol/L (ref 20.0–28.0)
Calcium, Ion: 1.22 mmol/L (ref 1.15–1.40)
HCT: 44 % (ref 39.0–52.0)
Hemoglobin: 15 g/dL (ref 13.0–17.0)
O2 Saturation: 67 %
Potassium: 4.2 mmol/L (ref 3.5–5.1)
Sodium: 139 mmol/L (ref 135–145)
TCO2: 26 mmol/L (ref 22–32)
pCO2, Ven: 47.4 mm[Hg] (ref 44–60)
pH, Ven: 7.314 (ref 7.25–7.43)
pO2, Ven: 38 mm[Hg] (ref 32–45)

## 2022-12-15 LAB — GLUCOSE, CAPILLARY: Glucose-Capillary: 152 mg/dL — ABNORMAL HIGH (ref 70–99)

## 2022-12-15 SURGERY — RIGHT/LEFT HEART CATH AND CORONARY ANGIOGRAPHY
Anesthesia: Moderate Sedation | Laterality: Bilateral

## 2022-12-15 MED ORDER — ACETAMINOPHEN 325 MG PO TABS
650.0000 mg | ORAL_TABLET | ORAL | Status: DC | PRN
Start: 2022-12-15 — End: 2022-12-15

## 2022-12-15 MED ORDER — HEPARIN (PORCINE) IN NACL 1000-0.9 UT/500ML-% IV SOLN
INTRAVENOUS | Status: DC | PRN
  Administered 2022-12-15 (×2): 500 mL

## 2022-12-15 MED ORDER — FENTANYL CITRATE (PF) 100 MCG/2ML IJ SOLN
INTRAMUSCULAR | Status: AC
  Filled 2022-12-15: qty 2

## 2022-12-15 MED ORDER — SODIUM CHLORIDE 0.9 % WEIGHT BASED INFUSION
1.0000 mL/kg/h | INTRAVENOUS | Status: DC
  Administered 2022-12-15: 1 mL/kg/h via INTRAVENOUS

## 2022-12-15 MED ORDER — SODIUM CHLORIDE 0.9 % IV SOLN: 250.0000 mL | INTRAVENOUS | Status: DC | PRN

## 2022-12-15 MED ORDER — VERAPAMIL HCL 2.5 MG/ML IV SOLN
INTRAVENOUS | Status: DC | PRN
  Administered 2022-12-15 (×2): 2.5 mg via INTRA_ARTERIAL

## 2022-12-15 MED ORDER — SODIUM CHLORIDE 0.9% FLUSH: 3.0000 mL | INTRAVENOUS | Status: DC | PRN

## 2022-12-15 MED ORDER — HEPARIN SODIUM (PORCINE) 1000 UNIT/ML IJ SOLN
INTRAMUSCULAR | Status: DC | PRN
  Administered 2022-12-15: 5000 [IU] via INTRAVENOUS

## 2022-12-15 MED ORDER — IOHEXOL 300 MG/ML  SOLN
INTRAMUSCULAR | Status: DC | PRN
  Administered 2022-12-15: 32 mL

## 2022-12-15 MED ORDER — LABETALOL HCL 5 MG/ML IV SOLN: 10.0000 mg | INTRAVENOUS | Status: DC | PRN

## 2022-12-15 MED ORDER — MIDAZOLAM HCL 2 MG/2ML IJ SOLN
INTRAMUSCULAR | Status: DC | PRN
  Administered 2022-12-15: 1 mg via INTRAVENOUS

## 2022-12-15 MED ORDER — MIDAZOLAM HCL 2 MG/2ML IJ SOLN
INTRAMUSCULAR | Status: AC
  Filled 2022-12-15: qty 2

## 2022-12-15 MED ORDER — ONDANSETRON HCL 4 MG/2ML IJ SOLN: 4.0000 mg | Freq: Four times a day (QID) | INTRAMUSCULAR | Status: DC | PRN

## 2022-12-15 MED ORDER — VERAPAMIL HCL 2.5 MG/ML IV SOLN
INTRAVENOUS | Status: AC
  Filled 2022-12-15: qty 2

## 2022-12-15 MED ORDER — HYDRALAZINE HCL 20 MG/ML IJ SOLN: 10.0000 mg | INTRAMUSCULAR | Status: DC | PRN

## 2022-12-15 MED ORDER — HEPARIN (PORCINE) IN NACL 1000-0.9 UT/500ML-% IV SOLN
INTRAVENOUS | Status: AC
  Filled 2022-12-15: qty 1000

## 2022-12-15 MED ORDER — ASPIRIN 81 MG PO CHEW: 81.0000 mg | CHEWABLE_TABLET | ORAL | Status: DC

## 2022-12-15 MED ORDER — SODIUM CHLORIDE 0.9% FLUSH: 3.0000 mL | Freq: Two times a day (BID) | INTRAVENOUS | Status: DC

## 2022-12-15 MED ORDER — SODIUM CHLORIDE 0.9 % WEIGHT BASED INFUSION
3.0000 mL/kg/h | INTRAVENOUS | Status: AC
  Administered 2022-12-15: 3 mL/kg/h via INTRAVENOUS

## 2022-12-15 MED ORDER — FENTANYL CITRATE (PF) 100 MCG/2ML IJ SOLN
INTRAMUSCULAR | Status: DC | PRN
  Administered 2022-12-15: 50 ug via INTRAVENOUS

## 2022-12-15 MED ORDER — HEPARIN SODIUM (PORCINE) 1000 UNIT/ML IJ SOLN
INTRAMUSCULAR | Status: AC
  Filled 2022-12-15: qty 10

## 2022-12-15 SURGICAL SUPPLY — 13 items
CATH 5FR JL3.5 JR4 ANG PIG MP (CATHETERS) IMPLANT
CATH BALLN WEDGE 5F 110CM (CATHETERS) IMPLANT
DEVICE RAD TR BAND REGULAR (VASCULAR PRODUCTS) IMPLANT
DRAPE BRACHIAL (DRAPES) IMPLANT
GLIDESHEATH SLEND SS 6F .021 (SHEATH) IMPLANT
GUIDEWIRE INQWIRE 1.5J.035X260 (WIRE) IMPLANT
INQWIRE 1.5J .035X260CM (WIRE) ×1
PACK CARDIAC CATH (CUSTOM PROCEDURE TRAY) ×2 IMPLANT
PAD ELECT DEFIB RADIOL ZOLL (MISCELLANEOUS) IMPLANT
PROTECTION STATION PRESSURIZED (MISCELLANEOUS) ×1
SET ATX-X65L (MISCELLANEOUS) IMPLANT
SHEATH GLIDE SLENDER 4/5FR (SHEATH) IMPLANT
STATION PROTECTION PRESSURIZED (MISCELLANEOUS) IMPLANT

## 2022-12-15 NOTE — Telephone Encounter (Signed)
Instructions faxed to number provider

## 2022-12-15 NOTE — Telephone Encounter (Signed)
Caller Lynden Ang) stated she wants a copy of patient's pre-op instructions faxed to fax# [phone].  Caller stated patient was expected at 9:00 am for a 10:00 am start.

## 2022-12-15 NOTE — Interval H&P Note (Signed)
History and Physical Interval Note:  12/15/2022 10:15 AM  Adam Becker  has presented today for surgery, with the diagnosis of HFmrEF.  The various methods of treatment have been discussed with the patient and family. After consideration of risks, benefits and other options for treatment, the patient has consented to  Procedure(s): RIGHT/LEFT HEART CATH AND CORONARY ANGIOGRAPHY (Bilateral) as a surgical intervention.  The patient's history has been reviewed, patient examined, no change in status, stable for surgery.  I have reviewed the patient's chart and labs.  Questions were answered to the patient's satisfaction.    Cath Lab Visit (complete for each Cath Lab visit)  Clinical Evaluation Leading to the Procedure:   ACS: No.  Non-ACS:    Anginal/Heart Failure Classification: NYHA class II  Anti-ischemic medical therapy: Minimal Therapy (1 class of medications)  Non-Invasive Test Results: LVEF 40-45% by echo -> intermediate risk.  Prior CABG: No previous CABG  Adam Becker

## 2022-12-18 ENCOUNTER — Encounter: Payer: Self-pay | Admitting: Internal Medicine

## 2022-12-25 ENCOUNTER — Other Ambulatory Visit: Payer: Self-pay

## 2022-12-25 MED ORDER — ENTRESTO 24-26 MG PO TABS: 1.0000 | ORAL_TABLET | Freq: Two times a day (BID) | ORAL | 0 refills | Status: DC

## 2022-12-25 NOTE — Telephone Encounter (Signed)
Requested Prescriptions   Signed Prescriptions Disp Refills   sacubitril-valsartan (ENTRESTO) 24-26 MG 180 tablet 0    Sig: Take 1 tablet by mouth 2 (two) times daily.    Authorizing Provider: Debbe Odea    Ordering User: Guerry Minors

## 2022-12-26 ENCOUNTER — Encounter: Payer: Self-pay | Admitting: Internal Medicine

## 2022-12-26 ENCOUNTER — Ambulatory Visit (INDEPENDENT_AMBULATORY_CARE_PROVIDER_SITE_OTHER): Payer: Medicare HMO | Admitting: Internal Medicine

## 2022-12-26 DIAGNOSIS — Z125 Encounter for screening for malignant neoplasm of prostate: Secondary | ICD-10-CM | POA: Diagnosis not present

## 2022-12-26 DIAGNOSIS — E669 Obesity, unspecified: Secondary | ICD-10-CM

## 2022-12-26 DIAGNOSIS — I5022 Chronic systolic (congestive) heart failure: Secondary | ICD-10-CM

## 2022-12-26 DIAGNOSIS — Z7984 Long term (current) use of oral hypoglycemic drugs: Secondary | ICD-10-CM

## 2022-12-26 DIAGNOSIS — E7800 Pure hypercholesterolemia, unspecified: Secondary | ICD-10-CM

## 2022-12-26 DIAGNOSIS — E1159 Type 2 diabetes mellitus with other circulatory complications: Secondary | ICD-10-CM | POA: Diagnosis not present

## 2022-12-26 DIAGNOSIS — F5104 Psychophysiologic insomnia: Secondary | ICD-10-CM

## 2022-12-26 DIAGNOSIS — I152 Hypertension secondary to endocrine disorders: Secondary | ICD-10-CM | POA: Diagnosis not present

## 2022-12-26 DIAGNOSIS — E1169 Type 2 diabetes mellitus with other specified complication: Secondary | ICD-10-CM | POA: Diagnosis not present

## 2022-12-26 LAB — POCT GLYCOSYLATED HEMOGLOBIN (HGB A1C): Hemoglobin A1C: 6.8 % — AB (ref 4.0–5.6)

## 2022-12-26 MED ORDER — TRAZODONE HCL 50 MG PO TABS: 25.0000 mg | ORAL_TABLET | Freq: Every evening | ORAL | 3 refills | Status: DC | PRN

## 2022-12-26 MED ORDER — METFORMIN HCL ER 500 MG PO TB24: ORAL_TABLET | ORAL | 1 refills | Status: DC

## 2022-12-26 NOTE — Assessment & Plan Note (Signed)
Discouarge continued use of Low dose alprazolam prescribed .  Recommend trial of relaxium or trazodone

## 2022-12-26 NOTE — Progress Notes (Signed)
Subjective:  Patient ID: Adam Becker, male    DOB: 1956-01-22  Age: 67 y.o. MRN: [id number]  CC: The primary encounter diagnosis was Obesity, diabetes, and hypertension syndrome (HCC). Diagnoses of Pure hypercholesterolemia, Heart failure with mildly reduced ejection fraction (HFmrEF) (HCC), Prostate cancer screening, and Psychophysiological insomnia were also pertinent to this visit.   HPI Adam Becker presents for  Chief Complaint  Patient presents with   Medical Management of Chronic Issues   1) Type 2 DM:    fastings have ranged from 120 to 135 .  Checks 2 hour prandials have been < 160 .  Taking metformin  and jardiance.    2( Occasional  post void insomnia, mind races, can;t go back to sleep.  Reviewed principles of sleep hygiene.   2) non ischemic CM:  reviewed recent cardiac cath   results done on Nov 22    .  Weighing daily, weight has been stable.   No orthopnea.      Outpatient Medications Prior to Visit  Medication Sig Dispense Refill   Accu-Chek Softclix Lancets lancets Use twice daily to check blood sugars  E11.69 100 each 12   acetaminophen (TYLENOL) 500 MG tablet Take 1,000 mg by mouth every 6 (six) hours as needed for moderate pain or headache.     allopurinol (ZYLOPRIM) 100 MG tablet TAKE 1 TABLET BY MOUTH EVERY DAY 90 tablet 3   aspirin EC 81 MG tablet Take 1 tablet (81 mg total) by mouth daily. 90 tablet 3   Bempedoic Acid (NEXLETOL) 180 MG TABS Take 1 tablet (180 mg total) by mouth daily. 90 tablet 3   Blood Glucose Monitoring Suppl (ACCU-CHEK GUIDE ME) w/Device KIT 1 Device by Does not apply route 2 (two) times daily. 1 kit 0   carvedilol (COREG) 6.25 MG tablet Take 1 tablet (6.25 mg total) by mouth 2 (two) times daily. 180 tablet 3   cholecalciferol (VITAMIN D3) 25 MCG (1000 UNIT) tablet Take 1,000 Units by mouth daily.     darunavir (PREZISTA) 800 MG tablet Take 1 tablet (800 mg total) by mouth daily. 30 tablet 11    elvitegravir-cobicistat-emtricitabine-tenofovir (GENVOYA) 150-150-200-10 MG TABS tablet TAKE 1 TABLET BY MOUTH EVERY DAY WITH BREAKFAST 30 tablet 11   empagliflozin (JARDIANCE) 10 MG TABS tablet TAKE 1 TABLET BY MOUTH EVERY DAY BEFORE BREAKFAST 90 tablet 3   ezetimibe (ZETIA) 10 MG tablet Take 1 tablet (10 mg total) by mouth daily. 90 tablet 3   gabapentin (NEURONTIN) 300 MG capsule TAKE 2 CAPSULES BY MOUTH 3 TIMES DAILY. 180 capsule 5   glucose blood (ACCU-CHEK GUIDE) test strip Use twice daily to check blood sugars  E11.69 100 each 12   glucose blood test strip Use to check blood sugars twice daily. ICD-10 E11.69 100 each 12   Lancets (ONETOUCH ULTRASOFT) lancets Use to check blood sugars twice daily. ICD-10 E11.69 100 each 12   loratadine (CLARITIN) 10 MG tablet Take 10 mg by mouth daily.     omeprazole (PRILOSEC) 20 MG capsule Take 20 mg by mouth daily.     rOPINIRole (REQUIP) 1 MG tablet Take 1.5 mg by mouth 4 (four) times daily.     sacubitril-valsartan (ENTRESTO) 24-26 MG Take 1 tablet by mouth 2 (two) times daily. 180 tablet 0   vitamin B-12 (CYANOCOBALAMIN) 1000 MCG tablet Take 1,000 mcg by mouth daily.     metFORMIN (GLUCOPHAGE-XR) 500 MG 24 hr tablet TAKE 1 TABLET BY MOUTH EVERY DAY WITH BREAKFAST  90 tablet 1   No facility-administered medications prior to visit.    Review of Systems;  Patient denies headache, fevers, malaise, unintentional weight loss, skin rash, eye pain, sinus congestion and sinus pain, sore throat, dysphagia,  hemoptysis , cough, dyspnea, wheezing, chest pain, palpitations, orthopnea, edema, abdominal pain, nausea, melena, diarrhea, constipation, flank pain, dysuria, hematuria, urinary  Frequency, nocturia, numbness, tingling, seizures,  Focal weakness, Loss of consciousness,  Tremor, insomnia, depression, anxiety, and suicidal ideation.      Objective:  There were no vitals taken for this visit.  BP Readings from Last 3 Encounters:  12/15/22 123/77   12/11/22 116/68  08/11/22 122/82    Wt Readings from Last 3 Encounters:  12/11/22 233 lb 12.8 oz (106.1 kg)  08/31/22 234 lb (106.1 kg)  08/11/22 236 lb 9.6 oz (107.3 kg)    Physical Exam Vitals reviewed.  Constitutional:      General: He is not in acute distress.    Appearance: Normal appearance. He is normal weight. He is not ill-appearing, toxic-appearing or diaphoretic.  HENT:     Head: Normocephalic.  Eyes:     General: No scleral icterus.       Right eye: No discharge.        Left eye: No discharge.     Conjunctiva/sclera: Conjunctivae normal.  Cardiovascular:     Rate and Rhythm: Normal rate and regular rhythm.     Heart sounds: Normal heart sounds.  Pulmonary:     Effort: Pulmonary effort is normal. No respiratory distress.     Breath sounds: Normal breath sounds.  Musculoskeletal:        General: Normal range of motion.     Cervical back: Normal range of motion.  Skin:    General: Skin is warm and dry.  Neurological:     General: No focal deficit present.     Mental Status: He is alert and oriented to person, place, and time. Mental status is at baseline.  Psychiatric:        Mood and Affect: Mood normal.        Behavior: Behavior normal.        Thought Content: Thought content normal.        Judgment: Judgment normal.    Lab Results  Component Value Date   HGBA1C 6.8 (A) 12/26/2022   HGBA1C 6.7 (A) 06/20/2022   HGBA1C 7.3 (H) 04/25/2022    Lab Results  Component Value Date   CREATININE 1.32 (H) 12/11/2022   CREATININE 1.36 (H) 06/07/2022   CREATININE 1.15 06/02/2022    Lab Results  Component Value Date   WBC 6.3 12/11/2022   HGB 14.6 12/15/2022   HCT 43.0 12/15/2022   PLT 409 12/11/2022   GLUCOSE 159 (H) 12/11/2022   CHOL 193 06/07/2022   TRIG 303 (H) 06/07/2022   HDL 43 06/07/2022   LDLDIRECT 151.0 04/25/2022   LDLCALC 110 (H) 06/07/2022   ALT 42 06/07/2022   AST 39 (H) 06/07/2022   NA 140 12/15/2022   K 4.2 12/15/2022   CL 104  12/11/2022   CREATININE 1.32 (H) 12/11/2022   BUN 22 12/11/2022   CO2 25 12/11/2022   TSH 1.06 04/29/2020   PSA 0.45 04/29/2020   HGBA1C 6.8 (A) 12/26/2022   MICROALBUR 14.5 (H) 05/16/2022    CARDIAC CATHETERIZATION  Result Date: 12/15/2022 Conclusions: Mild, non-obstructive coronary artery disease with 10-20% stenosis of the proximal/mid LAD and mid RCA. Normal left heart filling pressures. Upper normal to mildly  elevated right heart filling pressures. Low normal to mildly reduced Fick cardiac output/index. Recommendations: Continue medical therapy and risk factor modification to prevent progression of mild coronary artery disease. Continue escalation of goal-directed medical therapy for HFmrEF due to nonischemic cardiomyopathy. Yvonne Kendall, MD [facility]   Assessment & Plan:  .Obesity, diabetes, and hypertension syndrome (HCC) Assessment & Plan:  well-controlled on current medications.  hemoglobin A1c  is now  less than 7.0 . Patient is up-to-date on eye exams and foot exam is normal today. Patient has increased  urine microalbumin to creatinine ratio at next visit. 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  Adding hctz   Lab Results  Component Value Date   HGBA1C 6.8 (A) 12/26/2022   Lab Results  Component Value Date   MICROALBUR 14.5 (H) 05/16/2022   MICROALBUR 5.6 (H) 02/22/2021       Orders: -     POCT glycosylated hemoglobin (Hb A1C) -     Comprehensive metabolic panel  Pure hypercholesterolemia -     Lipid panel -     LDL cholesterol, direct  Heart failure with mildly reduced ejection fraction (HFmrEF) [facility]) Assessment & Plan: Nonischemic by diagnostic right/left cardiac catheterization Nov 2024.  Continue aggressive management of weight, hypertension and  diabetes.    Prostate cancer screening Assessment & Plan: He is overdue for screening with PSA   Orders: -     PSA;  Future  Psychophysiological insomnia Assessment & Plan: Discouarge continued use of Low dose alprazolam prescribed .  Recommend trial of relaxium or trazodone    Other orders -     metFORMIN HCl ER; TAKE 1 TABLET BY MOUTH EVERY DAY WITH BREAKFAST  Dispense: 90 tablet; Refill: 1 -     traZODone HCl; Take 0.5-1 tablets (25-50 mg total) by mouth at bedtime as needed for sleep.  Dispense: 30 tablet; Refill: 3     I provided 33 minutes of face-to-face time during this encounter reviewing patient's last visit with me, patient's  most recent visit with cardiology,  nrecent surgical and non surgical procedures, previous  labs and imaging studies, counseling on currently addressed issues,  and post visit ordering to diagnostics and therapeutics .   Follow-up: No follow-ups on file.   Sherlene Shams, MD

## 2022-12-26 NOTE — Patient Instructions (Signed)
  You might want to try using Relaxium for insomnia  (as seen on TV commercials) . It is available through [employer] and contains all natural supplements:  Melatonin 5 mg  Chamomile 25 mg Passionflower extract 75 mg GABA 100 mg Ashwaganda extract 125 mg Magnesium citrate, glycinate, oxide (100 mg)  L tryptophan 500 mg Valerest (proprietary  ingredient ; probably valeria root extract)    The alternative  I can prescribe is trazodone which is NOT  controlled  and NOT addictive .

## 2022-12-26 NOTE — Assessment & Plan Note (Signed)
Nonischemic by diagnostic right/left cardiac catheterization Nov 2024.  Continue aggressive management of weight, hypertension and  diabetes.

## 2022-12-26 NOTE — Assessment & Plan Note (Signed)
He is overdue for screening with PSA

## 2022-12-26 NOTE — Assessment & Plan Note (Addendum)
well-controlled on current medications.  hemoglobin A1c  is now  less than 7.0 . Patient is up-to-date on eye exams and foot exam is normal today. Patient has increased  urine microalbumin to creatinine ratio at next visit. 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  Adding hctz   Lab Results  Component Value Date   HGBA1C 6.8 (A) 12/26/2022   Lab Results  Component Value Date   MICROALBUR 14.5 (H) 05/16/2022   MICROALBUR 5.6 (H) 02/22/2021

## 2022-12-27 DIAGNOSIS — G4733 Obstructive sleep apnea (adult) (pediatric): Secondary | ICD-10-CM | POA: Diagnosis not present

## 2022-12-27 DIAGNOSIS — Z950 Presence of cardiac pacemaker: Secondary | ICD-10-CM | POA: Diagnosis not present

## 2022-12-27 DIAGNOSIS — I442 Atrioventricular block, complete: Secondary | ICD-10-CM | POA: Diagnosis not present

## 2022-12-27 DIAGNOSIS — E785 Hyperlipidemia, unspecified: Secondary | ICD-10-CM | POA: Diagnosis not present

## 2022-12-27 DIAGNOSIS — Z45018 Encounter for adjustment and management of other part of cardiac pacemaker: Secondary | ICD-10-CM | POA: Diagnosis not present

## 2022-12-27 DIAGNOSIS — I10 Essential (primary) hypertension: Secondary | ICD-10-CM | POA: Diagnosis not present

## 2022-12-27 LAB — COMPREHENSIVE METABOLIC PANEL
ALT: 38 U/L (ref 0–53)
AST: 46 U/L — ABNORMAL HIGH (ref 0–37)
Albumin: 4.3 g/dL (ref 3.5–5.2)
Alkaline Phosphatase: 50 U/L (ref 39–117)
BUN: 19 mg/dL (ref 6–23)
CO2: 30 meq/L (ref 19–32)
Calcium: 9.7 mg/dL (ref 8.4–10.5)
Chloride: 104 meq/L (ref 96–112)
Creatinine, Ser: 1.54 mg/dL — ABNORMAL HIGH (ref 0.40–1.50)
GFR: 46.81 mL/min — ABNORMAL LOW (ref >60.00)
Glucose, Bld: 114 mg/dL — ABNORMAL HIGH (ref 70–99)
Potassium: 4.2 meq/L (ref 3.5–5.1)
Sodium: 141 meq/L (ref 135–145)
Total Bilirubin: 0.5 mg/dL (ref 0.2–1.2)
Total Protein: 8.1 g/dL (ref 6.0–8.3)

## 2022-12-27 LAB — LIPID PANEL
Cholesterol: 176 mg/dL (ref 0–200)
HDL: 34.8 mg/dL — ABNORMAL LOW (ref >39.00)
LDL Cholesterol: 88 mg/dL (ref 0–99)
NonHDL: 140.78
Total CHOL/HDL Ratio: 5
Triglycerides: 266 mg/dL — ABNORMAL HIGH (ref 0.0–149.0)
VLDL: 53.2 mg/dL — ABNORMAL HIGH (ref 0.0–40.0)

## 2022-12-27 LAB — LDL CHOLESTEROL, DIRECT: Direct LDL: 107 mg/dL

## 2022-12-27 NOTE — Progress Notes (Signed)
Cardiology Clinic Note   Date: 12/29/2022 ID: MANVIK KOZLOFF, DOB [birth year], MRN [id number]  Primary Cardiologist:  Debbe Odea, MD  Patient Profile    Adam Becker is a 67 y.o. male who presents to the clinic today for follow up after LHC.     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 RA lead revision 12/20/2021. HFmrEF/nonischemic cardiomyopathy. Echo 12/06/2022: EF 40 to 45%.  Severe hypokinesis of the mid to distal anterior, anteroseptal and apical region.  Mild LVH.  Grade I DD.  Normal RV function.  Mild LAE.  Mild MR.  Moderate calcification of the aortic valve, mild AI/AS, mean gradient 14.4 mmHg. R/LHC 12/15/2022: Mild nonobstructive CAD with 10 to 20% stenosis of the proximal/mid LAD and mid RCA.  Normal left heart filling pressures.  Upper normal to mildly elevated right heart filling pressures.  Low normal to mildly reduced Fick cardiac output/index.  Recommendation for continued medical therapy and risk factor modification to prevent progression of mild CAD as well as escalation of GDMT for HFmrEF. Aortic stenosis. Hypertension. Hyperlipidemia. Lipid panel 12/26/2022: LDL 107, HDL 65, TG 266, total 176. TIA.   Neurosyphilis. OSA. CKD stage II. HIV. T2DM.  In summary, patient was first evaluated by Dr. Azucena Cecil on 12/05/2021 for aortic stenosis at the request of Dr. Darrick Huntsman.  Echocardiogram to evaluate cardiac murmur ordered by PCP which showed low normal LV function, no RWMA, Grade II DD, mild to moderate AS/AI, mean gradient 13 mmHg.  He reported a history of rhabdomyolysis with statins and was only taking Zetia at that time.  He was started on bempedoic acid.  Plan to repeat echo in 2024.  Patient presented to the [facility] ED on 12/17/2021 with complaints of syncope.  He was found to be in complete heart block and underwent PPM implantation on 12/19/2021 and  lead revision on 12/20/2021.  Repeat echo 12/06/2022 demonstrated mildly reduced LV function as detailed above.  Patient was instructed to stop Micardis and begin carvedilol and Entresto.  R/LHC showed mild nonobstructive CAD and upper normal to mildly elevated right heart filling pressures (details above).     History of Present Illness    Adam Becker is followed by Dr. Azucena Cecil for the above outlined history.   Patient was last seen in the office by me on 12/11/2022 to follow-up on echo.  He had no complaints at that time and was tolerating Entresto and carvedilol well.  He underwent R/LHC on 12/15/2022 which showed mild nonobstructive CAD, normal left heart filling pressures, upper normal to mildly elevated right heart filling pressures low normal to mildly reduced Fick cardiac output/index.  Today, patient is doing well. Patient denies shortness of breath, dyspnea on exertion, lower extremity edema, orthopnea or PND. No abdominal bloating/fullness or early satiety.  No chest pain, pressure, or tightness. No palpitations. Daily weight is stable. Patient has reduced his nighttime snacking and has lost some weight.      ROS: All other systems reviewed and are otherwise negative except as noted in History of Present Illness.  Studies Reviewed    EKG is not ordered today.   Physical Exam    VS:  BP 117/60 (BP Location: Left Arm, Patient Position: Sitting, Cuff Size: Normal)   Pulse 69   Ht 5\' 11"  (1.803 m)   Wt 237 lb 9.6 oz (107.8 kg)   SpO2 96%   BMI 33.14 kg/m  , BMI Body mass  index is 33.14 kg/m.  GEN: Well nourished, well developed, in no acute distress. Neck: No JVD or carotid bruits. Cardiac:  RRR. No murmurs. No rubs or gallops.   Respiratory:  Respirations regular and unlabored. Clear to auscultation without rales, wheezing or rhonchi. GI: Soft, nontender, nondistended. Extremities: Radials/DP/PT 2+ and equal bilaterally. No clubbing or cyanosis. No edema.   Skin: Warm and dry, no rash. Neuro: Strength intact.  Assessment & Plan   HFmrEF/nonischemic cardiomyopathy Echo November 2024 showed mildly reduced LV function, severe hypokinesis of the mid to distal anterior, anteroseptal and apical region, Grade I DD.  [facility] 12/15/2022 showed mild nonobstructive CAD, normal left heart filling pressures, upper normal to mildly elevated right heart filling pressures, low normal to mildly reduced Fick cardiac output/index.  Patient denies lower extremity edema, abdominal bloating/fullness, early satiety, shortness of breath at rest, DOE, orthopnea or PND. Daily weight is stable.  Euvolemic and well compensated on exam. -Continue carvedilol, Entresto, Jardiance. -Add spironolactone 12.5 mg daily. -If patient experiences hypotension he is instructed to contact the office. Consider decreasing carvedilol if that occurs.   Nonobstructive CAD [facility] 12/15/2022 showed mild nonobstructive CAD.  Patient denies chest pain, pressure or tightness. He is working on healthier eating habits.  -Continue aspirin, carvedilol, Nexletol, Zetia. -Increase physical activity as tolerated.    Complete heart block S/p PPM implantation November 2023 performed at [facility] in the setting of syncope. -Continue to follow with [facility].   Hypertension BP today 117/60. He reports rare, mild lightheadedness with position changes, particularly with bending over to standing. No dizziness. -Continue carvedilol, Entresto. -Add spironolactone. -Contact the office for hypotension.    Hyperlipidemia LDL December 2024 107.  Patient has a history of rhabdomyolysis on statins. -Continue Zetia and Nexletol.   Disposition: Spirnolactone 12.5 mg daily. BMP in 10 days. Return in 3 months or sooner as needed.          Signed, Etta Grandchild. Devesh Monforte, DNP, NP-C

## 2022-12-28 ENCOUNTER — Other Ambulatory Visit: Payer: Self-pay | Admitting: Internal Medicine

## 2022-12-28 DIAGNOSIS — Z125 Encounter for screening for malignant neoplasm of prostate: Secondary | ICD-10-CM

## 2022-12-28 NOTE — Addendum Note (Signed)
Addended by: Sherlene Shams on: 12/28/2022 08:55 AM   Modules accepted: Orders

## 2022-12-29 ENCOUNTER — Encounter: Payer: Self-pay | Admitting: Student

## 2022-12-29 ENCOUNTER — Ambulatory Visit: Payer: Medicare HMO | Attending: Student | Admitting: Student

## 2022-12-29 VITALS — BP 117/60 | HR 69 | Ht 71.0 in | Wt 237.6 lb

## 2022-12-29 DIAGNOSIS — I442 Atrioventricular block, complete: Secondary | ICD-10-CM | POA: Diagnosis not present

## 2022-12-29 DIAGNOSIS — I2510 Atherosclerotic heart disease of native coronary artery without angina pectoris: Secondary | ICD-10-CM

## 2022-12-29 DIAGNOSIS — E785 Hyperlipidemia, unspecified: Secondary | ICD-10-CM | POA: Diagnosis not present

## 2022-12-29 DIAGNOSIS — I10 Essential (primary) hypertension: Secondary | ICD-10-CM | POA: Diagnosis not present

## 2022-12-29 DIAGNOSIS — I5022 Chronic systolic (congestive) heart failure: Secondary | ICD-10-CM

## 2022-12-29 DIAGNOSIS — Z950 Presence of cardiac pacemaker: Secondary | ICD-10-CM | POA: Diagnosis not present

## 2022-12-29 DIAGNOSIS — Z125 Encounter for screening for malignant neoplasm of prostate: Secondary | ICD-10-CM

## 2022-12-29 DIAGNOSIS — Z79899 Other long term (current) drug therapy: Secondary | ICD-10-CM

## 2022-12-29 MED ORDER — SPIRONOLACTONE 25 MG PO TABS: 25.0000 mg | ORAL_TABLET | Freq: Every day | ORAL | 3 refills | Status: DC

## 2022-12-29 NOTE — Patient Instructions (Addendum)
Medication Instructions:  Your physician recommends the following medication changes.  START TAKING: Spironolactone 12.5 mg by mouth daily   *If you need a refill on your cardiac medications before your next appointment, please call your pharmacy*   Lab Work: Your provider would like for you to return in 10 days to have the following labs drawn: (BMP).   Please go to [facility] [address] ([facility] [address], [place] [address] You do not need an appointment.  They are open from 7:30 am-4 pm.  Lunch from 1:00 pm- 2:00 pm You will not need to be fasting.   Testing/Procedures: No test ordered today    Follow-Up: At [facility], you and your health needs are our priority.  As part of our continuing mission to provide you with exceptional heart care, we have created designated Provider Care Teams.  These Care Teams include your primary Cardiologist (physician) and Advanced Practice Providers (APPs -  Physician Assistants and Nurse Practitioners) who all work together to provide you with the care you need, when you need it.  We recommend signing up for the patient portal called "MyChart".  Sign up information is provided on this After Visit Summary.  MyChart is used to connect with patients for Virtual Visits (Telemedicine).  Patients are able to view lab/test results, encounter notes, upcoming appointments, etc.  Non-urgent messages can be sent to your provider as well.   To learn more about what you can do with MyChart, go to [email or web address].    Your next appointment:   3 month(s)  Provider:   You may see Debbe Odea, MD or one of the following Advanced Practice Providers on your designated Care Team:   Nicolasa Ducking, NP Eula Listen, PA-C Cadence Fransico Michael, PA-C Charlsie Quest, NP Carlos Levering, NP    Carlos Levering, NP recommends checking and keeping a log of your blood pressure daily. Please call the office if you notice  your blood pressure is running low  It is best to check your blood pressure 1-2 hours after taking your medications.  Below are some tips that our clinical pharmacists share for home BP monitoring:          Rest 10 minutes before taking your blood pressure.          Don't smoke or drink caffeinated beverages for at least 30 minutes before.          Take your blood pressure before (not after) you eat.          Sit comfortably with your back supported and both feet on the floor (don't cross your legs).          Elevate your arm to heart level on a table or a desk.          Use the proper sized cuff. It should fit smoothly and snugly around your bare upper arm. There should be enough room to slip a fingertip under the cuff. The bottom edge of the cuff should be 1 inch above the crease of the elbow.

## 2022-12-30 LAB — PSA: Prostate Specific Ag, Serum: 1.1 ng/mL (ref 0.0–4.0)

## 2023-01-09 DIAGNOSIS — Z79899 Other long term (current) drug therapy: Secondary | ICD-10-CM | POA: Diagnosis not present

## 2023-01-10 LAB — BASIC METABOLIC PANEL
BUN/Creatinine Ratio: 13 (ref 10–24)
BUN: 18 mg/dL (ref 8–27)
CO2: 20 mmol/L (ref 20–29)
Calcium: 9.7 mg/dL (ref 8.6–10.2)
Chloride: 104 mmol/L (ref 96–106)
Creatinine, Ser: 1.38 mg/dL — ABNORMAL HIGH (ref 0.76–1.27)
Glucose: 160 mg/dL — ABNORMAL HIGH (ref 70–99)
Potassium: 4.8 mmol/L (ref 3.5–5.2)
Sodium: 140 mmol/L (ref 134–144)
eGFR: 56 mL/min/{1.73_m2} — ABNORMAL LOW (ref >59)

## 2023-01-11 ENCOUNTER — Other Ambulatory Visit: Payer: Self-pay

## 2023-01-11 MED ORDER — ENTRESTO 24-26 MG PO TABS: 1.0000 | ORAL_TABLET | Freq: Two times a day (BID) | ORAL | 3 refills | Status: DC

## 2023-01-18 ENCOUNTER — Other Ambulatory Visit: Payer: Self-pay | Admitting: Internal Medicine

## 2023-01-19 ENCOUNTER — Telehealth: Payer: Self-pay

## 2023-01-19 NOTE — Telephone Encounter (Signed)
Copied from CRM [id number]. Topic: Clinical - Medical Advice >> Jan 19, 2023  3:42 PM Theodis Sato wrote:  Reason for CRM: PT would like Dr. Darrick Huntsman to know that he has been having bad chest congestion with excess brown mucus. PT declined an appointment

## 2023-01-19 NOTE — Telephone Encounter (Signed)
Noted. He should be evaluated for this.

## 2023-01-19 NOTE — Telephone Encounter (Signed)
FYI

## 2023-01-21 ENCOUNTER — Other Ambulatory Visit: Payer: Self-pay | Admitting: Internal Medicine

## 2023-01-21 DIAGNOSIS — G629 Polyneuropathy, unspecified: Secondary | ICD-10-CM

## 2023-01-22 ENCOUNTER — Encounter: Payer: Self-pay | Admitting: Family Medicine

## 2023-01-22 ENCOUNTER — Ambulatory Visit (INDEPENDENT_AMBULATORY_CARE_PROVIDER_SITE_OTHER): Payer: Medicare HMO | Admitting: Family Medicine

## 2023-01-22 VITALS — BP 128/86 | HR 76 | Temp 98.6°F | Ht 71.0 in | Wt 230.4 lb

## 2023-01-22 DIAGNOSIS — K921 Melena: Secondary | ICD-10-CM

## 2023-01-22 DIAGNOSIS — J40 Bronchitis, not specified as acute or chronic: Secondary | ICD-10-CM | POA: Insufficient documentation

## 2023-01-22 LAB — COMPREHENSIVE METABOLIC PANEL
ALT: 56 U/L — ABNORMAL HIGH (ref 0–53)
AST: 49 U/L — ABNORMAL HIGH (ref 0–37)
Albumin: 4.4 g/dL (ref 3.5–5.2)
Alkaline Phosphatase: 49 U/L (ref 39–117)
BUN: 18 mg/dL (ref 6–23)
CO2: 25 meq/L (ref 19–32)
Calcium: 8.9 mg/dL (ref 8.4–10.5)
Chloride: 102 meq/L (ref 96–112)
Creatinine, Ser: 1.36 mg/dL (ref 0.40–1.50)
GFR: 54.31 mL/min — ABNORMAL LOW (ref >60.00)
Glucose, Bld: 160 mg/dL — ABNORMAL HIGH (ref 70–99)
Potassium: 3.9 meq/L (ref 3.5–5.1)
Sodium: 135 meq/L (ref 135–145)
Total Bilirubin: 0.4 mg/dL (ref 0.2–1.2)
Total Protein: 8.3 g/dL (ref 6.0–8.3)

## 2023-01-22 LAB — CBC
HCT: 45.5 % (ref 39.0–52.0)
Hemoglobin: 15.3 g/dL (ref 13.0–17.0)
MCHC: 33.7 g/dL (ref 30.0–36.0)
MCV: 100.1 fL — ABNORMAL HIGH (ref 78.0–100.0)
Platelets: 334 10*3/uL (ref 150.0–400.0)
RBC: 4.54 Mil/uL (ref 4.22–5.81)
RDW: 14.1 % (ref 11.5–15.5)
WBC: 4.9 10*3/uL (ref 4.0–10.5)

## 2023-01-22 MED ORDER — OMEPRAZOLE 40 MG PO CPDR: 40.0000 mg | DELAYED_RELEASE_CAPSULE | Freq: Every day | ORAL | 0 refills | Status: DC

## 2023-01-22 MED ORDER — DOXYCYCLINE HYCLATE 100 MG PO TABS: 100.0000 mg | ORAL_TABLET | Freq: Two times a day (BID) | ORAL | 0 refills | Status: DC

## 2023-01-22 NOTE — Patient Instructions (Signed)
Nice to see you. We are going to get lab work today and contact you with the results. I referred you to GI. If you have bright red blood or your symptoms are not improving please let us know. I have sent in doxycycline.  I would suggest waiting a day or 2 to start this to see if you continue to improve.  If you continue to improve do not take the doxycycline.  If you are not improving over the next day or 2 you can go ahead and start the doxycycline.  There is some risk of diarrhea and skin sensitivity to the sun with the doxycycline.

## 2023-01-22 NOTE — Assessment & Plan Note (Addendum)
Patient describes black stools and epigastric discomfort which is concerning for gastric ulcer or lesion or other underlying undetermined cause.  He is on omeprazole that we will have him increase his omeprazole to 40 mg daily for 14 days.  Will refer to GI.  We will check labs as outlined.  Advised to seek medical attention for shortness of breath, fatigue, worsening black stools, bright red blood in his stool, and worsening abdominal pain.

## 2023-01-22 NOTE — Telephone Encounter (Signed)
Pt was seen this morning by Dr. Birdie Sons.

## 2023-01-22 NOTE — Assessment & Plan Note (Signed)
Patient symptoms consistent with bronchitis.  Discussed monitoring for another day or 2 versus going ahead and starting on doxycycline.  Encourage patient to monitor for another day or so and if improving he could hold off on starting the doxycycline.  If not improving he can start the doxycycline.  Discussed the risk of diarrhea and skin sensitivity to the sun with the doxycycline.  Patient's diarrhea may be related to what ever is causing his bronchitis.

## 2023-01-22 NOTE — Progress Notes (Signed)
Marikay Alar, MD Phone: [phone]  Adam Becker is a 67 y.o. male who presents today for same-day visit.  Cough: Patient notes onset of symptoms on 12/25.  Has had cough and chest congestion.  Was coughing up brown mucus.  Notes some sinus congestion.  Notes things are a little bit better though not significantly better.  Notes no shortness of breath with this.  Also notes onset of diarrhea around the same time.  Had some stomach discomfort with this.  Notes some nausea though no vomiting.  Noted a day or 2 ago having black stools.  He did take some Pepto-Bismol although he noted the black stools prior to using the Pepto-Bismol.  Social History   Tobacco Use  Smoking Status Never  Smokeless Tobacco Never    Current Outpatient Medications on File Prior to Visit  Medication Sig Dispense Refill   Accu-Chek Softclix Lancets lancets Use twice daily to check blood sugars  E11.69 100 each 12   acetaminophen (TYLENOL) 500 MG tablet Take 1,000 mg by mouth every 6 (six) hours as needed for moderate pain or headache.     allopurinol (ZYLOPRIM) 100 MG tablet TAKE 1 TABLET BY MOUTH EVERY DAY 90 tablet 3   aspirin EC 81 MG tablet Take 1 tablet (81 mg total) by mouth daily. 90 tablet 3   Bempedoic Acid (NEXLETOL) 180 MG TABS Take 1 tablet (180 mg total) by mouth daily. 90 tablet 3   Blood Glucose Monitoring Suppl (ACCU-CHEK GUIDE ME) w/Device KIT 1 Device by Does not apply route 2 (two) times daily. 1 kit 0   carvedilol (COREG) 6.25 MG tablet Take 1 tablet (6.25 mg total) by mouth 2 (two) times daily. 180 tablet 3   cholecalciferol (VITAMIN D3) 25 MCG (1000 UNIT) tablet Take 1,000 Units by mouth daily.     darunavir (PREZISTA) 800 MG tablet Take 1 tablet (800 mg total) by mouth daily. 30 tablet 11   elvitegravir-cobicistat-emtricitabine-tenofovir (GENVOYA) 150-150-200-10 MG TABS tablet TAKE 1 TABLET BY MOUTH EVERY DAY WITH BREAKFAST 30 tablet 11   empagliflozin (JARDIANCE) 10 MG TABS tablet  TAKE 1 TABLET BY MOUTH EVERY DAY BEFORE BREAKFAST 90 tablet 3   ezetimibe (ZETIA) 10 MG tablet Take 1 tablet (10 mg total) by mouth daily. 90 tablet 3   gabapentin (NEURONTIN) 300 MG capsule TAKE 2 CAPSULES BY MOUTH 3 TIMES A DAY 180 capsule 5   glucose blood (ACCU-CHEK GUIDE) test strip Use twice daily to check blood sugars  E11.69 100 each 12   glucose blood test strip Use to check blood sugars twice daily. ICD-10 E11.69 100 each 12   Lancets (ONETOUCH ULTRASOFT) lancets Use to check blood sugars twice daily. ICD-10 E11.69 100 each 12   loratadine (CLARITIN) 10 MG tablet Take 10 mg by mouth daily.     metFORMIN (GLUCOPHAGE-XR) 500 MG 24 hr tablet TAKE 1 TABLET BY MOUTH EVERY DAY WITH BREAKFAST 90 tablet 1   omeprazole (PRILOSEC) 20 MG capsule Take 20 mg by mouth daily.     rOPINIRole (REQUIP) 1 MG tablet Take 1.5 mg by mouth 4 (four) times daily.     sacubitril-valsartan (ENTRESTO) 24-26 MG Take 1 tablet by mouth 2 (two) times daily. 180 tablet 3   spironolactone (ALDACTONE) 25 MG tablet Take 1 tablet (25 mg total) by mouth daily. 45 tablet 3   traZODone (DESYREL) 50 MG tablet TAKE 0.5-1 TABLETS BY MOUTH AT BEDTIME AS NEEDED FOR SLEEP. 90 tablet 2   vitamin B-12 (CYANOCOBALAMIN) 1000  MCG tablet Take 1,000 mcg by mouth daily.     No current facility-administered medications on file prior to visit.     ROS see history of present illness  Objective  Physical Exam Vitals:   01/22/23 0936  BP: 128/86  Pulse: 76  Temp: 98.6 F (37 C)  SpO2: 96%    BP Readings from Last 3 Encounters:  01/22/23 128/86  12/29/22 117/60  12/15/22 123/77   Wt Readings from Last 3 Encounters:  01/22/23 230 lb 6.4 oz (104.5 kg)  12/29/22 237 lb 9.6 oz (107.8 kg)  12/11/22 233 lb 12.8 oz (106.1 kg)    Physical Exam Constitutional:      General: He is not in acute distress.    Appearance: He is not diaphoretic.  Cardiovascular:     Rate and Rhythm: Normal rate and regular rhythm.     Heart  sounds: Normal heart sounds.  Pulmonary:     Effort: Pulmonary effort is normal.     Comments: Coarse inspiratory breath sounds throughout Abdominal:     General: Bowel sounds are normal. There is no distension.     Palpations: Abdomen is soft.     Tenderness: There is no abdominal tenderness.  Skin:    General: Skin is warm and dry.  Neurological:     Mental Status: He is alert.      Assessment/Plan: Please see individual problem list.  Melena Assessment & Plan: Patient describes black stools and epigastric discomfort which is concerning for gastric ulcer or lesion or other underlying undetermined cause.  He is on omeprazole that we will have him increase his omeprazole to 40 mg daily for 14 days.  Will refer to GI.  We will check labs as outlined.  Advised to seek medical attention for shortness of breath, fatigue, worsening black stools, bright red blood in his stool, and worsening abdominal pain.  Orders: -     CBC -     Comprehensive metabolic panel -     Ambulatory referral to Gastroenterology -     Omeprazole; Take 1 capsule (40 mg total) by mouth daily for 14 days.  Dispense: 14 capsule; Refill: 0  Bronchitis Assessment & Plan: Patient symptoms consistent with bronchitis.  Discussed monitoring for another day or 2 versus going ahead and starting on doxycycline.  Encourage patient to monitor for another day or so and if improving he could hold off on starting the doxycycline.  If not improving he can start the doxycycline.  Discussed the risk of diarrhea and skin sensitivity to the sun with the doxycycline.  Patient's diarrhea may be related to what ever is causing his bronchitis.  Orders: -     Doxycycline Hyclate; Take 1 tablet (100 mg total) by mouth 2 (two) times daily.  Dispense: 14 tablet; Refill: 0    Return if symptoms worsen or fail to improve.   Marikay Alar, MD [facility]

## 2023-01-30 ENCOUNTER — Other Ambulatory Visit: Payer: Self-pay

## 2023-01-30 DIAGNOSIS — R748 Abnormal levels of other serum enzymes: Secondary | ICD-10-CM

## 2023-02-06 ENCOUNTER — Other Ambulatory Visit: Payer: Self-pay | Admitting: Internal Medicine

## 2023-02-08 ENCOUNTER — Ambulatory Visit: Payer: No Typology Code available for payment source | Admitting: Gastroenterology

## 2023-02-08 NOTE — Progress Notes (Deleted)
HPI :    Colonoscopy Nov 2022 - Mild diverticulosis in the sigmoid colon, in the descending colon and in the ascending colon. There was no evidence of diverticular bleeding.  - Non-bleeding internal hemorrhoids. - Otherwise normal colonoscopy. Repeat colonoscopy in 10 years  Past Medical History:  Diagnosis Date   1st degree AV block    seen on ekg   Asplenia 11/11/2014   Chronic kidney disease 11/2011   admission for [facility] cr 11, acidosis   Diabetes mellitus without complication (HCC)    Type II   GERD (gastroesophageal reflux disease)    Gout    Heart murmur    HIV disease (HCC) 06/21/2022   HIV infection (HCC) 1994   Hyperglycemia 04/04/2016   Hyperlipidemia    Hypertension    Impaired fasting glucose 07/27/2015   LLL pneumonia 07/06/2021   Myopathy 06/21/2021   Neuromuscular disorder (HCC)    peripheral neuropathy   Neuropathy    TIA (transient ischemic attack) 08/18/2017   Umbilical hernia    Urethritis 02/11/2015     Past Surgical History:  Procedure Laterality Date   COLONOSCOPY     LIPOSUCTION HEAD / NECK  2001   posterior neck   MUSCLE BIOPSY Right 04/01/2021   Procedure: MUSCLE BIOPSY;  Surgeon: Earline Mayotte, MD;  Location: [facility] ORS;  Service: General;  Laterality: Right;  gastrocnemius; O.R. to notify pathology of procedure   RIGHT/LEFT HEART CATH AND CORONARY ANGIOGRAPHY Bilateral 12/15/2022   Procedure: RIGHT/LEFT HEART CATH AND CORONARY ANGIOGRAPHY;  Surgeon: Yvonne Kendall, MD;  Location: [facility] INVASIVE CV LAB;  Service: Cardiovascular;  Laterality: Bilateral;   SPLENECTOMY  2000   reason unclear "stopped working"   Family History  Problem Relation Age of Onset   Cancer Mother    Breast cancer Mother    Colon polyps Father    Colon cancer Father    Birth defects Son        Breast    Rectal cancer Neg Hx    Stomach cancer Neg Hx    Social History   Tobacco Use   Smoking status: Never   Smokeless tobacco: Never  Vaping Use    Vaping status: Never Used  Substance Use Topics   Alcohol use: No   Drug use: No   Current Outpatient Medications  Medication Sig Dispense Refill   Accu-Chek Softclix Lancets lancets Use twice daily to check blood sugars  E11.69 100 each 12   acetaminophen (TYLENOL) 500 MG tablet Take 1,000 mg by mouth every 6 (six) hours as needed for moderate pain or headache.     allopurinol (ZYLOPRIM) 100 MG tablet TAKE 1 TABLET BY MOUTH EVERY DAY 90 tablet 3   aspirin EC 81 MG tablet Take 1 tablet (81 mg total) by mouth daily. 90 tablet 3   Bempedoic Acid (NEXLETOL) 180 MG TABS Take 1 tablet (180 mg total) by mouth daily. 90 tablet 3   Blood Glucose Monitoring Suppl (ACCU-CHEK GUIDE ME) w/Device KIT 1 Device by Does not apply route 2 (two) times daily. 1 kit 0   carvedilol (COREG) 6.25 MG tablet Take 1 tablet (6.25 mg total) by mouth 2 (two) times daily. 180 tablet 3   cholecalciferol (VITAMIN D3) 25 MCG (1000 UNIT) tablet Take 1,000 Units by mouth daily.     darunavir (PREZISTA) 800 MG tablet Take 1 tablet (800 mg total) by mouth daily. 30 tablet 11   doxycycline (VIBRA-TABS) 100 MG tablet Take 1 tablet (100 mg total) by  mouth 2 (two) times daily. 14 tablet 0   elvitegravir-cobicistat-emtricitabine-tenofovir (GENVOYA) 150-150-200-10 MG TABS tablet TAKE 1 TABLET BY MOUTH EVERY DAY WITH BREAKFAST 30 tablet 11   empagliflozin (JARDIANCE) 10 MG TABS tablet TAKE 1 TABLET BY MOUTH EVERY DAY BEFORE BREAKFAST 90 tablet 3   ezetimibe (ZETIA) 10 MG tablet Take 1 tablet (10 mg total) by mouth daily. 90 tablet 3   gabapentin (NEURONTIN) 300 MG capsule TAKE 2 CAPSULES BY MOUTH 3 TIMES A DAY 180 capsule 5   glucose blood test strip Use to check blood sugars twice daily. ICD-10 E11.69 100 each 12   glucose blood test strip Use twice daily to check blood sugars E11.69 100 each 12   Lancets (ONETOUCH ULTRASOFT) lancets Use to check blood sugars twice daily. ICD-10 E11.69 100 each 12   loratadine (CLARITIN) 10 MG tablet  Take 10 mg by mouth daily.     metFORMIN (GLUCOPHAGE-XR) 500 MG 24 hr tablet TAKE 1 TABLET BY MOUTH EVERY DAY WITH BREAKFAST 90 tablet 1   omeprazole (PRILOSEC) 20 MG capsule Take 20 mg by mouth daily.     omeprazole (PRILOSEC) 40 MG capsule Take 1 capsule (40 mg total) by mouth daily for 14 days. 14 capsule 0   rOPINIRole (REQUIP) 1 MG tablet Take 1.5 mg by mouth 4 (four) times daily.     sacubitril-valsartan (ENTRESTO) 24-26 MG Take 1 tablet by mouth 2 (two) times daily. 180 tablet 3   spironolactone (ALDACTONE) 25 MG tablet Take 1 tablet (25 mg total) by mouth daily. 45 tablet 3   traZODone (DESYREL) 50 MG tablet TAKE 0.5-1 TABLETS BY MOUTH AT BEDTIME AS NEEDED FOR SLEEP. 90 tablet 2   vitamin B-12 (CYANOCOBALAMIN) 1000 MCG tablet Take 1,000 mcg by mouth daily.     No current facility-administered medications for this visit.   Allergies  Allergen Reactions   Pravastatin Other (See Comments)    rhabdomyolysis     Review of Systems: All systems reviewed and negative except where noted in HPI.    No results found.  Physical Exam: There were no vitals taken for this visit. Constitutional: Pleasant,well-developed, ***male in no acute distress. HEENT: Normocephalic and atraumatic. Conjunctivae are normal. No scleral icterus. Neck supple.  Cardiovascular: Normal rate, regular rhythm.  Pulmonary/chest: Effort normal and breath sounds normal. No wheezing, rales or rhonchi. Abdominal: Soft, nondistended, nontender. Bowel sounds active throughout. There are no masses palpable. No hepatomegaly. Extremities: no edema Lymphadenopathy: No cervical adenopathy noted. Neurological: Alert and oriented to person place and time. Skin: Skin is warm and dry. No rashes noted. Psychiatric: Normal mood and affect. Behavior is normal.  CBC    Component Value Date/Time   WBC 4.9 01/22/2023 1009   RBC 4.54 01/22/2023 1009   HGB 15.3 01/22/2023 1009   HGB 14.6 12/11/2022 0907   HCT 45.5  01/22/2023 1009   HCT 44.3 12/11/2022 0907   PLT 334.0 01/22/2023 1009   PLT 409 12/11/2022 0907   MCV 100.1 (H) 01/22/2023 1009   MCV 99 (H) 12/11/2022 0907   MCV 91 11/27/2011 0336   MCH 32.7 12/11/2022 0907   MCH 32.7 06/07/2022 1404   MCHC 33.7 01/22/2023 1009   RDW 14.1 01/22/2023 1009   RDW 12.4 12/11/2022 0907   RDW 13.0 11/27/2011 0336   LYMPHSABS 3,862 06/07/2022 1404   LYMPHSABS 2.9 11/27/2011 0336   MONOABS 0.8 06/02/2022 0752   MONOABS 2.2 (H) 11/27/2011 0336   EOSABS 391 06/07/2022 1404   EOSABS 0.4 11/27/2011  0336   BASOSABS 71 06/07/2022 1404   BASOSABS 0.1 11/27/2011 0336    CMP     Component Value Date/Time   NA 135 01/22/2023 1009   NA 140 01/09/2023 0942   NA 141 11/29/2011 0431   K 3.9 01/22/2023 1009   K 3.3 (L) 11/29/2011 0431   CL 102 01/22/2023 1009   CL 113 (H) 11/29/2011 0431   CO2 25 01/22/2023 1009   CO2 26 11/29/2011 0431   GLUCOSE 160 (H) 01/22/2023 1009   GLUCOSE 106 (H) 11/29/2011 0431   BUN 18 01/22/2023 1009   BUN 18 01/09/2023 0942   BUN 94 (H) 11/29/2011 0431   CREATININE 1.36 01/22/2023 1009   CREATININE 1.36 (H) 06/07/2022 1404   CALCIUM 8.9 01/22/2023 1009   CALCIUM 8.7 11/29/2011 0431   PROT 8.3 01/22/2023 1009   PROT 8.4 (H) 11/27/2011 0336   ALBUMIN 4.4 01/22/2023 1009   ALBUMIN 3.3 (L) 11/28/2011 0440   AST 49 (H) 01/22/2023 1009   AST 29 11/27/2011 0336   ALT 56 (H) 01/22/2023 1009   ALT 25 11/27/2011 0336   ALKPHOS 49 01/22/2023 1009   ALKPHOS 70 11/27/2011 0336   BILITOT 0.4 01/22/2023 1009   BILITOT 0.4 11/27/2011 0336   GFRNONAA >60 06/02/2022 0752   GFRNONAA 58 (L) 06/10/2020 1058   GFRAA 67 06/10/2020 1058       Latest Ref Rng & Units 01/22/2023   10:09 AM 12/15/2022   12:05 PM 12/15/2022   12:02 PM  CBC EXTENDED  WBC 4.0 - 10.5 K/uL 4.9     RBC 4.22 - 5.81 Mil/uL 4.54     Hemoglobin 13.0 - 17.0 g/dL 62.9  52.8  41.3   HCT 39.0 - 52.0 % 45.5  43.0  44.0   Platelets 150.0 - 400.0 K/uL 334.0          ASSESSMENT AND PLAN:  Glori Luis, MD

## 2023-02-16 ENCOUNTER — Other Ambulatory Visit (HOSPITAL_COMMUNITY): Payer: Self-pay

## 2023-02-16 ENCOUNTER — Telehealth: Payer: Self-pay

## 2023-02-16 NOTE — Telephone Encounter (Signed)
Pharmacy Patient Advocate Encounter   Received notification from CoverMyMeds that prior authorization for NEXLETOL is required/requested.   Insurance verification completed.   The patient is insured through [employer] .   Per test claim: The current 30 day co-pay is, $12.15.  No PA needed at this time. This test claim was processed through [facility]- copay amounts may vary at other pharmacies due to pharmacy/plan contracts, or as the patient moves through the different stages of their insurance plan.

## 2023-02-20 ENCOUNTER — Other Ambulatory Visit (INDEPENDENT_AMBULATORY_CARE_PROVIDER_SITE_OTHER): Payer: No Typology Code available for payment source

## 2023-02-20 DIAGNOSIS — Z125 Encounter for screening for malignant neoplasm of prostate: Secondary | ICD-10-CM | POA: Diagnosis not present

## 2023-02-20 DIAGNOSIS — G729 Myopathy, unspecified: Secondary | ICD-10-CM

## 2023-02-20 DIAGNOSIS — R748 Abnormal levels of other serum enzymes: Secondary | ICD-10-CM

## 2023-02-20 LAB — COMPREHENSIVE METABOLIC PANEL
ALT: 24 U/L (ref 0–53)
AST: 24 U/L (ref 0–37)
Albumin: 4.5 g/dL (ref 3.5–5.2)
Alkaline Phosphatase: 48 U/L (ref 39–117)
BUN: 17 mg/dL (ref 6–23)
CO2: 30 meq/L (ref 19–32)
Calcium: 9.8 mg/dL (ref 8.4–10.5)
Chloride: 101 meq/L (ref 96–112)
Creatinine, Ser: 1.34 mg/dL (ref 0.40–1.50)
GFR: 55.25 mL/min — ABNORMAL LOW (ref >60.00)
Glucose, Bld: 160 mg/dL — ABNORMAL HIGH (ref 70–99)
Potassium: 4.8 meq/L (ref 3.5–5.1)
Sodium: 138 meq/L (ref 135–145)
Total Bilirubin: 0.6 mg/dL (ref 0.2–1.2)
Total Protein: 7.8 g/dL (ref 6.0–8.3)

## 2023-02-20 LAB — CK: Total CK: 193 U/L (ref 7–232)

## 2023-02-20 LAB — PSA: PSA: 0.91 ng/mL (ref 0.10–4.00)

## 2023-02-21 ENCOUNTER — Encounter: Payer: Self-pay | Admitting: Family Medicine

## 2023-03-01 DIAGNOSIS — I152 Hypertension secondary to endocrine disorders: Secondary | ICD-10-CM | POA: Diagnosis not present

## 2023-03-01 DIAGNOSIS — E669 Obesity, unspecified: Secondary | ICD-10-CM | POA: Diagnosis not present

## 2023-03-01 DIAGNOSIS — G5603 Carpal tunnel syndrome, bilateral upper limbs: Secondary | ICD-10-CM | POA: Diagnosis not present

## 2023-03-01 DIAGNOSIS — E1159 Type 2 diabetes mellitus with other circulatory complications: Secondary | ICD-10-CM | POA: Diagnosis not present

## 2023-03-01 DIAGNOSIS — G4733 Obstructive sleep apnea (adult) (pediatric): Secondary | ICD-10-CM | POA: Diagnosis not present

## 2023-03-01 DIAGNOSIS — E1169 Type 2 diabetes mellitus with other specified complication: Secondary | ICD-10-CM | POA: Diagnosis not present

## 2023-03-01 DIAGNOSIS — G608 Other hereditary and idiopathic neuropathies: Secondary | ICD-10-CM | POA: Diagnosis not present

## 2023-03-01 DIAGNOSIS — G2581 Restless legs syndrome: Secondary | ICD-10-CM | POA: Diagnosis not present

## 2023-03-19 ENCOUNTER — Other Ambulatory Visit: Payer: Self-pay | Admitting: Internal Medicine

## 2023-03-28 DIAGNOSIS — Z8679 Personal history of other diseases of the circulatory system: Secondary | ICD-10-CM | POA: Diagnosis not present

## 2023-03-28 DIAGNOSIS — I10 Essential (primary) hypertension: Secondary | ICD-10-CM | POA: Diagnosis not present

## 2023-03-28 DIAGNOSIS — I350 Nonrheumatic aortic (valve) stenosis: Secondary | ICD-10-CM | POA: Diagnosis not present

## 2023-03-28 DIAGNOSIS — E1169 Type 2 diabetes mellitus with other specified complication: Secondary | ICD-10-CM | POA: Diagnosis not present

## 2023-03-28 DIAGNOSIS — Z950 Presence of cardiac pacemaker: Secondary | ICD-10-CM | POA: Diagnosis not present

## 2023-03-28 DIAGNOSIS — I5022 Chronic systolic (congestive) heart failure: Secondary | ICD-10-CM | POA: Diagnosis not present

## 2023-03-28 DIAGNOSIS — E785 Hyperlipidemia, unspecified: Secondary | ICD-10-CM | POA: Diagnosis not present

## 2023-03-28 DIAGNOSIS — I442 Atrioventricular block, complete: Secondary | ICD-10-CM | POA: Diagnosis not present

## 2023-03-28 DIAGNOSIS — G4733 Obstructive sleep apnea (adult) (pediatric): Secondary | ICD-10-CM | POA: Diagnosis not present

## 2023-03-28 DIAGNOSIS — I129 Hypertensive chronic kidney disease with stage 1 through stage 4 chronic kidney disease, or unspecified chronic kidney disease: Secondary | ICD-10-CM | POA: Diagnosis not present

## 2023-03-28 DIAGNOSIS — Z45018 Encounter for adjustment and management of other part of cardiac pacemaker: Secondary | ICD-10-CM | POA: Diagnosis not present

## 2023-03-28 NOTE — Progress Notes (Signed)
 Cardiology Clinic Note   Date: 03/30/2023 ID: Adam Becker, DOB 13-Dec-1955, MRN [id number]  Primary Cardiologist:  Debbe Odea, MD  Chief Complaint   Adam Becker is a 68 y.o. male who presents to the clinic today for routine follow up.   Patient Profile   Adam Becker is followed by Dr. Azucena Cecil for the history outlined below.      Past medical history significant for: Nonobstructive CAD. R/LHC 12/15/2022: Mild nonobstructive CAD with 10 to 20% stenosis of the proximal/mid LAD and mid RCA. Complete heart block. PPM implantation 12/19/2021 performed at [facility]. RA lead revision 12/20/2021. HFmrEF/nonischemic cardiomyopathy. Echo 12/06/2022: EF 40 to 45%.  Severe hypokinesis of the mid to distal anterior, anteroseptal and apical region.  Mild LVH.  Grade I DD.  Normal RV function.  Mild LAE.  Mild MR.  Moderate calcification of the aortic valve, mild AI/AS, mean gradient 14.4 mmHg. R/LHC 12/15/2022:   Normal left heart filling pressures.  Upper normal to mildly elevated right heart filling pressures.  Low normal to mildly reduced Fick cardiac output/index.  Recommendation for continued medical therapy and risk factor modification to prevent progression of mild CAD as well as escalation of GDMT for HFmrEF. Aortic stenosis. Hypertension. Hyperlipidemia. Lipid panel 12/26/2022: LDL 107, HDL 65, TG 266, total 176. TIA.   Neurosyphilis. OSA. CKD stage II. HIV. T2DM.  In summary, patient was first evaluated by Dr. Azucena Cecil on 12/05/2021 for aortic stenosis at the request of Dr. Darrick Huntsman.  Echocardiogram to evaluate cardiac murmur ordered by PCP which showed low normal LV function, no RWMA, Grade II DD, mild to moderate AS/AI, mean gradient 13 mmHg.  He reported a history of rhabdomyolysis with statins and was only taking Zetia at that time.  He was started on bempedoic acid.  Plan to repeat echo in 2024.  Patient presented to the [facility]  health ED on 12/17/2021 with complaints of syncope.  He was found to be in complete heart block and underwent PPM implantation on 12/19/2021 and lead revision on 12/20/2021.  Repeat echo 12/06/2022 demonstrated mildly reduced LV function as detailed above.  Patient was instructed to stop Micardis and begin carvedilol and Entresto.  R/LHC showed mild nonobstructive CAD and upper normal to mildly elevated right heart filling pressures (details above).   Patient was last seen in the office by me on 12/29/2022 for follow-up after [facility].  He was doing well at that time.  Spironolactone was added.     History of Present Illness    Today, patient is doing well. Patient denies shortness of breath, dyspnea on exertion, lower extremity edema, orthopnea or PND. Home weight has been stable. No chest pain, pressure, or tightness. No palpitations. He is active walking on the treadmill and doing light weight lifting 3-4 times a week. He is looking forward to the warmer weather so he can do some yard work.      ROS: All other systems reviewed and are otherwise negative except as noted in History of Present Illness.  EKGs/Labs Reviewed    EKG Interpretation Date/Time:  Friday March 30 2023 09:08:42 EST Ventricular Rate:  65 PR Interval:  194 QRS Duration:  160 QT Interval:  454 QTC Calculation: 472 R Axis:   -53  Text Interpretation: Normal sinus rhythm Left axis deviation Left bundle branch block When compared with ECG of 11-Dec-2022 08:18, Premature ventricular complexes are no longer Present Confirmed by Carlos Levering [phone]) on 03/30/2023 9:25:10 AM   02/20/2023:  ALT 24; AST 24; BUN 17; Creatinine, Ser 1.34; Potassium 4.8; Sodium 138   01/22/2023: Hemoglobin 15.3; WBC 4.9   Physical Exam    VS:  BP 117/74 (BP Location: Left Arm, Patient Position: Sitting, Cuff Size: Normal)   Pulse 65   Ht 5\' 11"  (1.803 m)   Wt 234 lb (106.1 kg)   SpO2 95%   BMI 32.64 kg/m  , BMI Body mass index is 32.64  kg/m.  GEN: Well nourished, well developed, in no acute distress. Neck: No JVD or carotid bruits. Cardiac:  RRR. No murmurs. No rubs or gallops.   Respiratory:  Respirations regular and unlabored. Clear to auscultation without rales, wheezing or rhonchi. GI: Soft, nontender, nondistended. Extremities: Radials/DP/PT 2+ and equal bilaterally. No clubbing or cyanosis. No edema.  Skin: Warm and dry, no rash. Neuro: Strength intact.  Assessment & Plan   HFmrEF/nonischemic cardiomyopathy Echo November 2024 showed mildly reduced LV function, severe hypokinesis of the mid to distal anterior, anteroseptal and apical region, Grade I DD.  [facility] 12/15/2022 showed mild nonobstructive CAD, normal left heart filling pressures, upper normal to mildly elevated right heart filling pressures, low normal to mildly reduced Fick cardiac output/index.  Patient denies lower extremity edema, orthopnea, PND. His home weight has been stable.  Euvolemic and well compensated on exam. -Continue carvedilol, Entresto, Jardiance, spironolactone.   Nonobstructive CAD [facility] 12/15/2022 showed mild nonobstructive CAD.  Patient denies chest pain, pressure or tightness. He walks on the treadmill and lifts light weights 3-4 times a week.  -Continue aspirin, carvedilol, Nexletol, Zetia. -Continue to increase physical activity as tolerated.   Complete heart block S/p PPM implantation November 2023 performed at [facility] in the setting of syncope. No report of lightheadedness, dizziness, presyncope or syncope.  -Continue to follow with [facility].   Hypertension BP today 117/74. No report of headaches or dizziness.  -Continue carvedilol, Entresto, spironolactone.   Hyperlipidemia LDL December 2024 107.  Patient has a history of rhabdomyolysis on statins. -Continue Zetia and Nexletol.  Disposition: Return in 6 months or sooner as needed.          Signed, Etta Grandchild. Adam Nold, DNP, NP-C

## 2023-03-30 ENCOUNTER — Ambulatory Visit: Payer: Medicare HMO | Attending: Student | Admitting: Student

## 2023-03-30 ENCOUNTER — Encounter: Payer: Self-pay | Admitting: Student

## 2023-03-30 VITALS — BP 117/74 | HR 65 | Ht 71.0 in | Wt 234.0 lb

## 2023-03-30 DIAGNOSIS — I442 Atrioventricular block, complete: Secondary | ICD-10-CM

## 2023-03-30 DIAGNOSIS — I2510 Atherosclerotic heart disease of native coronary artery without angina pectoris: Secondary | ICD-10-CM

## 2023-03-30 DIAGNOSIS — I428 Other cardiomyopathies: Secondary | ICD-10-CM | POA: Diagnosis not present

## 2023-03-30 DIAGNOSIS — E785 Hyperlipidemia, unspecified: Secondary | ICD-10-CM | POA: Diagnosis not present

## 2023-03-30 DIAGNOSIS — I10 Essential (primary) hypertension: Secondary | ICD-10-CM | POA: Diagnosis not present

## 2023-03-30 DIAGNOSIS — I5022 Chronic systolic (congestive) heart failure: Secondary | ICD-10-CM | POA: Diagnosis not present

## 2023-03-30 NOTE — Patient Instructions (Signed)
 Medication Instructions:  Your physician recommends that you continue on your current medications as directed. Please refer to the Current Medication list given to you today.  *If you need a refill on your cardiac medications before your next appointment, please call your pharmacy*  Lab Work: None ordered If you have labs (blood work) drawn today and your tests are completely normal, you will receive your results only by: MyChart Message (if you have MyChart) OR A paper copy in the mail If you have any lab test that is abnormal or we need to change your treatment, we will call you to review the results.  Follow-Up: At [facility], you and your health needs are our priority.  As part of our continuing mission to provide you with exceptional heart care, we have created designated Provider Care Teams.  These Care Teams include your primary Cardiologist (physician) and Advanced Practice Providers (APPs -  Physician Assistants and Nurse Practitioners) who all work together to provide you with the care you need, when you need it.  Your next appointment:   6 month(s)  Provider:   You may see Debbe Odea, MD or one of the following Advanced Practice Providers on your designated Care Team:   Nicolasa Ducking, NP Eula Listen, PA-C Cadence Fransico Michael, PA-C Charlsie Quest, NP Carlos Levering, NP

## 2023-04-04 DIAGNOSIS — I350 Nonrheumatic aortic (valve) stenosis: Secondary | ICD-10-CM | POA: Diagnosis not present

## 2023-04-04 DIAGNOSIS — I352 Nonrheumatic aortic (valve) stenosis with insufficiency: Secondary | ICD-10-CM | POA: Diagnosis not present

## 2023-04-04 DIAGNOSIS — E785 Hyperlipidemia, unspecified: Secondary | ICD-10-CM | POA: Diagnosis not present

## 2023-04-04 DIAGNOSIS — I071 Rheumatic tricuspid insufficiency: Secondary | ICD-10-CM | POA: Diagnosis not present

## 2023-04-04 DIAGNOSIS — Z8679 Personal history of other diseases of the circulatory system: Secondary | ICD-10-CM | POA: Diagnosis not present

## 2023-04-04 DIAGNOSIS — I5022 Chronic systolic (congestive) heart failure: Secondary | ICD-10-CM | POA: Diagnosis not present

## 2023-04-04 DIAGNOSIS — I442 Atrioventricular block, complete: Secondary | ICD-10-CM | POA: Diagnosis not present

## 2023-04-04 DIAGNOSIS — G4733 Obstructive sleep apnea (adult) (pediatric): Secondary | ICD-10-CM | POA: Diagnosis not present

## 2023-04-04 DIAGNOSIS — I129 Hypertensive chronic kidney disease with stage 1 through stage 4 chronic kidney disease, or unspecified chronic kidney disease: Secondary | ICD-10-CM | POA: Diagnosis not present

## 2023-04-04 DIAGNOSIS — Z45018 Encounter for adjustment and management of other part of cardiac pacemaker: Secondary | ICD-10-CM | POA: Diagnosis not present

## 2023-04-04 DIAGNOSIS — I10 Essential (primary) hypertension: Secondary | ICD-10-CM | POA: Diagnosis not present

## 2023-04-04 DIAGNOSIS — Z950 Presence of cardiac pacemaker: Secondary | ICD-10-CM | POA: Diagnosis not present

## 2023-04-04 DIAGNOSIS — E1169 Type 2 diabetes mellitus with other specified complication: Secondary | ICD-10-CM | POA: Diagnosis not present

## 2023-04-15 ENCOUNTER — Other Ambulatory Visit: Payer: Self-pay | Admitting: Student

## 2023-04-17 ENCOUNTER — Other Ambulatory Visit: Payer: Self-pay

## 2023-04-17 MED ORDER — NEXLETOL 180 MG PO TABS: 180.0000 mg | ORAL_TABLET | Freq: Every day | ORAL | 3 refills | Status: AC

## 2023-04-30 ENCOUNTER — Other Ambulatory Visit: Payer: Self-pay | Admitting: Internal Medicine

## 2023-04-30 DIAGNOSIS — E669 Obesity, unspecified: Secondary | ICD-10-CM

## 2023-05-18 ENCOUNTER — Ambulatory Visit (INDEPENDENT_AMBULATORY_CARE_PROVIDER_SITE_OTHER): Payer: No Typology Code available for payment source | Admitting: Gastroenterology

## 2023-05-18 ENCOUNTER — Encounter: Payer: Self-pay | Admitting: Gastroenterology

## 2023-05-18 VITALS — BP 122/64 | HR 69 | Ht 71.0 in | Wt 233.0 lb

## 2023-05-18 DIAGNOSIS — R748 Abnormal levels of other serum enzymes: Secondary | ICD-10-CM | POA: Diagnosis not present

## 2023-05-18 DIAGNOSIS — K921 Melena: Secondary | ICD-10-CM | POA: Diagnosis not present

## 2023-05-18 DIAGNOSIS — Z800 Family history of malignant neoplasm of digestive organs: Secondary | ICD-10-CM | POA: Diagnosis not present

## 2023-05-18 NOTE — Progress Notes (Signed)
 Discussed the use of AI scribe software for clinical note transcription with the patient, who gave verbal consent to proceed.  HPI : Adam Becker is a 68 year old male with diabetes, OSA, HIV, DM, CKD, HTN, HLD, mild non-obstructive CAD, and complete heart block s/p PPM who is referred by his primary doctor due to concerns about black stools.  Approximately four months ago, he experienced a few episodes of black stools, which he attributes to taking Pepto Bismol. Labs taken at that showed normal CBC and normal BUN.  Since then, he has not had any further episodes of black stools. No current gastrointestinal symptoms such as constipation, diarrhea, abdominal pain, nausea, vomiting, or significant heartburn. He occasionally experiences mild reflux symptoms, which he manages by taking something to help him belch, but these symptoms occur infrequently.  He has a history of mildly elevated liver enzymes, hepatocellular pattern, which have since normalized as of Jan 2025. There is no family history of liver issues, and he denies alcohol consumption. He has a family history of colon cancer in his father. He underwent a colonoscopy a couple of years ago, which was normal, and he is not due for another for ten years.  He has diabetes and acknowledges being overweight. He describes his weight as fluctuating and reports engaging in some physical activity when possible. He has a sweet tooth and consumes high sugar foods occasionally, though he primarily drinks water and only occasionally consumes soda.      Colonoscopy Dec 21, 2020 Indication: Screening, family history of colon cancer (father, >age 86) Normal, repeat 10 years  Colonoscopy Oct 2015 (Dr. Grandville Lax) 4 mm polyp, lymphoid aggregate  Colonoscopy Sept 2010 (Dr. Grandville Lax) Normal  Past Medical History:  Diagnosis Date   1st degree AV block    seen on ekg   Asplenia 11/11/2014   Chronic kidney disease 11/2011   admission for [facility] cr 11,  acidosis   Diabetes mellitus without complication (HCC)    Type II   GERD (gastroesophageal reflux disease)    Gout    Heart murmur    HIV disease (HCC) 06/21/2022   HIV infection (HCC) 1994   Hyperglycemia 04/04/2016   Hyperlipidemia    Hypertension    Impaired fasting glucose 07/27/2015   LLL pneumonia 07/06/2021   Myopathy 06/21/2021   Neuromuscular disorder (HCC)    peripheral neuropathy   Neuropathy    TIA (transient ischemic attack) 08/18/2017   Umbilical hernia    Urethritis 02/11/2015     Past Surgical History:  Procedure Laterality Date   COLONOSCOPY     LIPOSUCTION HEAD / NECK  2001   posterior neck   MUSCLE BIOPSY Right 04/01/2021   Procedure: MUSCLE BIOPSY;  Surgeon: Marshall Skeeter, MD;  Location: [facility] ORS;  Service: General;  Laterality: Right;  gastrocnemius; O.R. to notify pathology of procedure   RIGHT/LEFT HEART CATH AND CORONARY ANGIOGRAPHY Bilateral 12/15/2022   Procedure: RIGHT/LEFT HEART CATH AND CORONARY ANGIOGRAPHY;  Surgeon: Sammy Crisp, MD;  Location: [facility] INVASIVE CV LAB;  Service: Cardiovascular;  Laterality: Bilateral;   SPLENECTOMY  2000   reason unclear "stopped working"   Family History  Problem Relation Age of Onset   Cancer Mother    Breast cancer Mother    Colon polyps Father    Colon cancer Father    Birth defects Son        Breast    Rectal cancer Neg Hx    Stomach cancer Neg Hx  Social History   Tobacco Use   Smoking status: Never   Smokeless tobacco: Never  Vaping Use   Vaping status: Never Used  Substance Use Topics   Alcohol use: No   Drug use: No   Current Outpatient Medications  Medication Sig Dispense Refill   Accu-Chek Softclix Lancets lancets Use twice daily to check blood sugars  E11.69 100 each 12   acetaminophen  (TYLENOL ) 500 MG tablet Take 1,000 mg by mouth every 6 (six) hours as needed for moderate pain or headache.     allopurinol  (ZYLOPRIM ) 100 MG tablet TAKE 1 TABLET BY MOUTH EVERY DAY 90  tablet 3   aspirin  EC 81 MG tablet Take 1 tablet (81 mg total) by mouth daily. 90 tablet 3   Bempedoic Acid  (NEXLETOL ) 180 MG TABS Take 1 tablet (180 mg total) by mouth daily. 90 tablet 3   Blood Glucose Monitoring Suppl (ACCU-CHEK GUIDE ME) w/Device KIT 1 Device by Does not apply route 2 (two) times daily. 1 kit 0   carvedilol  (COREG ) 6.25 MG tablet Take 1 tablet (6.25 mg total) by mouth 2 (two) times daily. 180 tablet 3   cholecalciferol (VITAMIN D3) 25 MCG (1000 UNIT) tablet Take 1,000 Units by mouth daily.     darunavir  (PREZISTA ) 800 MG tablet Take 1 tablet (800 mg total) by mouth daily. 30 tablet 11   doxycycline  (VIBRA -TABS) 100 MG tablet Take 1 tablet (100 mg total) by mouth 2 (two) times daily. 14 tablet 0   elvitegravir-cobicistat-emtricitabine-tenofovir (GENVOYA ) 150-150-200-10 MG TABS tablet TAKE 1 TABLET BY MOUTH EVERY DAY WITH BREAKFAST 30 tablet 11   empagliflozin  (JARDIANCE ) 10 MG TABS tablet TAKE 1 TABLET BY MOUTH EVERY DAY BEFORE BREAKFAST 90 tablet 3   ezetimibe  (ZETIA ) 10 MG tablet Take 1 tablet (10 mg total) by mouth daily. 90 tablet 3   gabapentin  (NEURONTIN ) 300 MG capsule TAKE 2 CAPSULES BY MOUTH 3 TIMES A DAY 180 capsule 5   glucose blood test strip Use to check blood sugars twice daily. ICD-10 E11.69 100 each 12   glucose blood test strip Use twice daily to check blood sugars E11.69 100 each 12   Lancets (ONETOUCH ULTRASOFT) lancets Use to check blood sugars twice daily. ICD-10 E11.69 100 each 12   loratadine (CLARITIN) 10 MG tablet Take 10 mg by mouth daily.     metFORMIN  (GLUCOPHAGE -XR) 500 MG 24 hr tablet TAKE 1 TABLET BY MOUTH EVERY DAY WITH BREAKFAST 90 tablet 1   omeprazole  (PRILOSEC) 20 MG capsule Take 20 mg by mouth daily.     rOPINIRole (REQUIP) 1 MG tablet Take 1.5 mg by mouth 4 (four) times daily.     sacubitril-valsartan (ENTRESTO ) 24-26 MG Take 1 tablet by mouth 2 (two) times daily. 180 tablet 3   spironolactone  (ALDACTONE ) 25 MG tablet Take 1 tablet (25 mg  total) by mouth daily. 90 tablet 3   traZODone  (DESYREL ) 50 MG tablet TAKE 0.5-1 TABLETS BY MOUTH AT BEDTIME AS NEEDED FOR SLEEP. 90 tablet 2   vitamin B-12 (CYANOCOBALAMIN ) 1000 MCG tablet Take 1,000 mcg by mouth daily.     omeprazole  (PRILOSEC) 40 MG capsule Take 1 capsule (40 mg total) by mouth daily for 14 days. 14 capsule 0   No current facility-administered medications for this visit.   Allergies  Allergen Reactions   Pravastatin  Other (See Comments)    rhabdomyolysis     Review of Systems: All systems reviewed and negative except where noted in HPI.    No results found.  Physical Exam: BP 122/64   Pulse 69   Ht 5\' 11"  (1.803 m)   Wt 233 lb (105.7 kg)   BMI 32.50 kg/m  Constitutional: Pleasant,well-developed, African American male in no acute distress. Abdominal: Soft, nondistended, nontender. Bowel sounds active throughout. There are no masses palpable. No hepatomegaly. Extremities: no edema Neurological: Alert and oriented to person place and time. Skin: Skin is warm and dry. No rashes noted. Psychiatric: Normal mood and affect. Behavior is normal.  CBC    Component Value Date/Time   WBC 4.9 01/22/2023 1009   RBC 4.54 01/22/2023 1009   HGB 15.3 01/22/2023 1009   HGB 14.6 12/11/2022 0907   HCT 45.5 01/22/2023 1009   HCT 44.3 12/11/2022 0907   PLT 334.0 01/22/2023 1009   PLT 409 12/11/2022 0907   MCV 100.1 (H) 01/22/2023 1009   MCV 99 (H) 12/11/2022 0907   MCV 91 11/27/2011 0336   MCH 32.7 12/11/2022 0907   MCH 32.7 06/07/2022 1404   MCHC 33.7 01/22/2023 1009   RDW 14.1 01/22/2023 1009   RDW 12.4 12/11/2022 0907   RDW 13.0 11/27/2011 0336   LYMPHSABS 3,862 06/07/2022 1404   LYMPHSABS 2.9 11/27/2011 0336   MONOABS 0.8 06/02/2022 0752   MONOABS 2.2 (H) 11/27/2011 0336   EOSABS 391 06/07/2022 1404   EOSABS 0.4 11/27/2011 0336   BASOSABS 71 06/07/2022 1404   BASOSABS 0.1 11/27/2011 0336    CMP     Component Value Date/Time   NA 138 02/20/2023 0901    NA 140 01/09/2023 0942   NA 141 11/29/2011 0431   K 4.8 02/20/2023 0901   K 3.3 (L) 11/29/2011 0431   CL 101 02/20/2023 0901   CL 113 (H) 11/29/2011 0431   CO2 30 02/20/2023 0901   CO2 26 11/29/2011 0431   GLUCOSE 160 (H) 02/20/2023 0901   GLUCOSE 106 (H) 11/29/2011 0431   BUN 17 02/20/2023 0901   BUN 18 01/09/2023 0942   BUN 94 (H) 11/29/2011 0431   CREATININE 1.34 02/20/2023 0901   CREATININE 1.36 (H) 06/07/2022 1404   CALCIUM  9.8 02/20/2023 0901   CALCIUM  8.7 11/29/2011 0431   PROT 7.8 02/20/2023 0901   PROT 8.4 (H) 11/27/2011 0336   ALBUMIN 4.5 02/20/2023 0901   ALBUMIN 3.3 (L) 11/28/2011 0440   AST 24 02/20/2023 0901   AST 29 11/27/2011 0336   ALT 24 02/20/2023 0901   ALT 25 11/27/2011 0336   ALKPHOS 48 02/20/2023 0901   ALKPHOS 70 11/27/2011 0336   BILITOT 0.6 02/20/2023 0901   BILITOT 0.4 11/27/2011 0336   GFRNONAA >60 06/02/2022 0752   GFRNONAA 58 (L) 06/10/2020 1058   GFRAA 67 06/10/2020 1058       Latest Ref Rng & Units 01/22/2023   10:09 AM 12/15/2022   12:05 PM 12/15/2022   12:02 PM  CBC EXTENDED  WBC 4.0 - 10.5 K/uL 4.9     RBC 4.22 - 5.81 Mil/uL 4.54     Hemoglobin 13.0 - 17.0 g/dL 09.8  11.9  14.7   HCT 39.0 - 52.0 % 45.5  43.0  44.0   Platelets 150.0 - 400.0 K/uL 334.0         ASSESSMENT AND PLAN:  68 year old male with medical history as above, referred for black stools concerning for melena.  However, patient states he was taking Pepto Bismol at the time.  He had normal CBC and BUN and he has not experienced any recurrence of black stools.  Very  low suspicion for melena.  Do not feel upper endoscopy is warranted.    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 Discussed dietary modifications, particularly reducing high sugar foods and sodas, as these are main instigators for fatty liver disease. Encouraged healthy living, exercise, and managing diabetes  as part of the treatment plan. Discussed the benefits of black coffee for liver health. - Advise dietary modifications to reduce high sugar foods and sodas - Encourage healthy living and exercise - Promote black coffee consumption for liver health - Order liver ultrasound to assess for fatty liver disease - Consider elastography to assess liver stiffness if fatty liver is confirmed  Elevated liver enzymes Transient elevation of liver enzymes previously noted, now normalized. Possible MASLD suspected due to risk factors including type 2 diabetes mellitus and being overweight. No family history of liver issues and no alcohol consumption. Discussed the potential for MASLD to progress to cirrhosis in some cases, though most do not develop significant liver disease. Emphasized the importance of monitoring liver health.  Black stools Transient, resolved, suspect secondary to Pepto.  Normal CBC, BUN - No endoscopic evaluation recommended  Colon cancer screening Current guidelines do not recommend colonoscopies every 5 years for patients with first degree relatives diagnosed with colon cancer after age 40 - Patient will be due for next colonoscopy 2032     Rynn Markiewicz E. Cherryl Corona, MD  Gastroenterology   Thersia Flax, MD

## 2023-05-18 NOTE — Patient Instructions (Signed)
 You have been scheduled for an abdominal ultrasound at [facility] (1st floor of hospital) on 05/24/23 at 10am. Please arrive 30 minutes prior to your appointment for registration. Make certain not to have anything to eat or drink after midnight the night before. Should you need to reschedule your appointment, please contact radiology at [phone]. This test typically takes about 30 minutes to perform.   _______________________________________________________  If your blood pressure at your visit was 140/90 or greater, please contact your primary care physician to follow up on this.  _______________________________________________________  If you are age 17 or older, your body mass index should be between 23-30. Your Body mass index is 32.5 kg/m. If this is out of the aforementioned range listed, please consider follow up with your Primary Care Provider.  If you are age 30 or younger, your body mass index should be between 19-25. Your Body mass index is 32.5 kg/m. If this is out of the aformentioned range listed, please consider follow up with your Primary Care Provider.   ________________________________________________________  The [facility] providers would like to encourage you to use MYCHART to communicate with providers for non-urgent requests or questions.  Due to long hold times on the telephone, sending your provider a message by [facility] may be a faster and more efficient way to get a response.  Please allow 48 business hours for a response.  Please remember that this is for non-urgent requests.  _______________________________________________________   It was a pleasure to see you today!  Thank you for trusting me with your gastrointestinal care!    Scott E.Cherryl Corona, MD

## 2023-05-24 ENCOUNTER — Ambulatory Visit

## 2023-05-28 ENCOUNTER — Ambulatory Visit
Admission: RE | Admit: 2023-05-28 | Discharge: 2023-05-28 | Disposition: A | Discharge status: Home / Self Care | Source: Ambulatory Visit | Attending: Gastroenterology | Admitting: Gastroenterology

## 2023-05-28 DIAGNOSIS — K760 Fatty (change of) liver, not elsewhere classified: Secondary | ICD-10-CM | POA: Diagnosis not present

## 2023-05-28 DIAGNOSIS — R748 Abnormal levels of other serum enzymes: Secondary | ICD-10-CM | POA: Diagnosis not present

## 2023-06-01 ENCOUNTER — Encounter: Payer: Self-pay | Admitting: Gastroenterology

## 2023-06-01 NOTE — Progress Notes (Signed)
 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   As discussed most patients with MASLD do not develop significant liver disease in their lifetime, however, some do. MASLD can be completely reversed and your liver can become normal again through lifestyle modifications to include weight loss through diet and exercise and controlling diabetes.  I recommend you repeat an ultrasound in 1 year with elastography (measures liver stiffness).  If there is evidence of scarring in your liver, then we can consider additional medications to slow the progression of your liver injury.  Maya,  Please place reminder for repeat RUQUS with Elastography in 1 year.

## 2023-06-04 ENCOUNTER — Other Ambulatory Visit: Payer: Self-pay | Admitting: Internal Medicine

## 2023-06-05 ENCOUNTER — Other Ambulatory Visit: Payer: Self-pay | Admitting: Infectious Disease

## 2023-06-05 DIAGNOSIS — B20 Human immunodeficiency virus [HIV] disease: Secondary | ICD-10-CM

## 2023-06-05 MED ORDER — GENVOYA 150-150-200-10 MG PO TABS: ORAL_TABLET | ORAL | 0 refills | Status: DC

## 2023-06-07 NOTE — Progress Notes (Signed)
 The 10-year ASCVD risk score (Arnett DK, et al., 2019) is: 28.9%   Values used to calculate the score:     Age: 68 years     Sex: Male     Is Non-Hispanic African American: Yes     Diabetic: Yes     Tobacco smoker: No     Systolic Blood Pressure: 122 mmHg     Is BP treated: Yes     HDL Cholesterol: 34.8 mg/dL     Total Cholesterol: 176 mg/dL  No current statin therapy, pravastatin  listed as allergy.   Cailie Bosshart, BSN, RN

## 2023-06-12 ENCOUNTER — Telehealth: Payer: Self-pay

## 2023-06-12 ENCOUNTER — Other Ambulatory Visit: Payer: Self-pay | Admitting: Infectious Disease

## 2023-06-12 ENCOUNTER — Other Ambulatory Visit: Payer: Self-pay | Admitting: Internal Medicine

## 2023-06-12 DIAGNOSIS — B20 Human immunodeficiency virus [HIV] disease: Secondary | ICD-10-CM

## 2023-06-12 DIAGNOSIS — E1159 Type 2 diabetes mellitus with other circulatory complications: Secondary | ICD-10-CM

## 2023-06-12 NOTE — Telephone Encounter (Signed)
 LVM requesting a call back. Pt needs 1 year f/u with [facility]. No recent MyChart activity.

## 2023-06-12 NOTE — Telephone Encounter (Signed)
 Front desk reaching out ot schedule follow up appt

## 2023-06-13 NOTE — Telephone Encounter (Signed)
 Refill was sent 5/13  Arlon Bergamo, BSN, RN

## 2023-06-19 ENCOUNTER — Other Ambulatory Visit: Payer: Self-pay

## 2023-06-19 ENCOUNTER — Encounter: Payer: Self-pay | Admitting: Infectious Disease

## 2023-06-19 ENCOUNTER — Ambulatory Visit (INDEPENDENT_AMBULATORY_CARE_PROVIDER_SITE_OTHER): Admitting: Infectious Disease

## 2023-06-19 VITALS — BP 116/78 | HR 63 | Temp 97.8°F | Ht 71.0 in | Wt 230.0 lb

## 2023-06-19 DIAGNOSIS — Z113 Encounter for screening for infections with a predominantly sexual mode of transmission: Secondary | ICD-10-CM

## 2023-06-19 DIAGNOSIS — A523 Neurosyphilis, unspecified: Secondary | ICD-10-CM

## 2023-06-19 DIAGNOSIS — E7800 Pure hypercholesterolemia, unspecified: Secondary | ICD-10-CM | POA: Diagnosis not present

## 2023-06-19 DIAGNOSIS — Z1212 Encounter for screening for malignant neoplasm of rectum: Secondary | ICD-10-CM

## 2023-06-19 DIAGNOSIS — B20 Human immunodeficiency virus [HIV] disease: Secondary | ICD-10-CM

## 2023-06-19 DIAGNOSIS — N342 Other urethritis: Secondary | ICD-10-CM | POA: Diagnosis not present

## 2023-06-19 DIAGNOSIS — E785 Hyperlipidemia, unspecified: Secondary | ICD-10-CM

## 2023-06-19 DIAGNOSIS — I442 Atrioventricular block, complete: Secondary | ICD-10-CM

## 2023-06-19 DIAGNOSIS — A528 Late syphilis, latent: Secondary | ICD-10-CM | POA: Diagnosis not present

## 2023-06-19 DIAGNOSIS — I10 Essential (primary) hypertension: Secondary | ICD-10-CM

## 2023-06-19 DIAGNOSIS — Z950 Presence of cardiac pacemaker: Secondary | ICD-10-CM | POA: Diagnosis not present

## 2023-06-19 DIAGNOSIS — I5022 Chronic systolic (congestive) heart failure: Secondary | ICD-10-CM

## 2023-06-19 DIAGNOSIS — E119 Type 2 diabetes mellitus without complications: Secondary | ICD-10-CM

## 2023-06-19 DIAGNOSIS — K7581 Nonalcoholic steatohepatitis (NASH): Secondary | ICD-10-CM

## 2023-06-19 DIAGNOSIS — I129 Hypertensive chronic kidney disease with stage 1 through stage 4 chronic kidney disease, or unspecified chronic kidney disease: Secondary | ICD-10-CM

## 2023-06-19 DIAGNOSIS — N182 Chronic kidney disease, stage 2 (mild): Secondary | ICD-10-CM

## 2023-06-19 MED ORDER — GENVOYA 150-150-200-10 MG PO TABS
ORAL_TABLET | ORAL | 0 refills | Status: DC
Start: 2023-06-19 — End: 2023-07-02

## 2023-06-19 MED ORDER — DARUNAVIR 800 MG PO TABS: 800.0000 mg | ORAL_TABLET | Freq: Every day | ORAL | 11 refills | Status: DC

## 2023-06-19 NOTE — Progress Notes (Signed)
 Subjective:  Chief complaint: follow-up for HIV disease on medications   Patient ID: Adam Becker, male    DOB: [birth year], 68 y.o.   MRN: [id number]  HPI  Past Medical History:  Diagnosis Date   1st degree AV block    seen on ekg   Asplenia 11/11/2014   Chronic kidney disease 11/2011   admission for [facility] cr 11, acidosis   Diabetes mellitus without complication (HCC)    Type II   GERD (gastroesophageal reflux disease)    Gout    Heart murmur    HIV disease (HCC) 06/21/2022   HIV infection (HCC) 1994   Hyperglycemia 04/04/2016   Hyperlipidemia    Hypertension    Impaired fasting glucose 07/27/2015   LLL pneumonia 07/06/2021   Myopathy 06/21/2021   Neuromuscular disorder (HCC)    peripheral neuropathy   Neuropathy    TIA (transient ischemic attack) 08/18/2017   Umbilical hernia    Urethritis 02/11/2015    Past Surgical History:  Procedure Laterality Date   COLONOSCOPY     LIPOSUCTION HEAD / NECK  2001   posterior neck   MUSCLE BIOPSY Right 04/01/2021   Procedure: MUSCLE BIOPSY;  Surgeon: Marshall Skeeter, MD;  Location: [facility] ORS;  Service: General;  Laterality: Right;  gastrocnemius; O.R. to notify pathology of procedure   RIGHT/LEFT HEART CATH AND CORONARY ANGIOGRAPHY Bilateral 12/15/2022   Procedure: RIGHT/LEFT HEART CATH AND CORONARY ANGIOGRAPHY;  Surgeon: Sammy Crisp, MD;  Location: [facility];  Service: Cardiovascular;  Laterality: Bilateral;   SPLENECTOMY  2000   reason unclear "stopped working"    Family History  Problem Relation Age of Onset   Cancer Mother    Breast cancer Mother    Colon polyps Father    Colon cancer Father    Birth defects Son        Breast    Rectal cancer Neg Hx    Stomach cancer Neg Hx       Social History   Socioeconomic History   Marital status: Single    Spouse name: Not on file   Number of children: 0   Years of education: Not on file   Highest education level: Not on file  Occupational History    Occupation: retired Games developer  Tobacco Use   Smoking status: Never   Smokeless tobacco: Never  Vaping Use   Vaping status: Never Used  Substance and Sexual Activity   Alcohol use: No   Drug use: No   Sexual activity: Not Currently  Other Topics Concern   Not on file  Social History Narrative   Independent baseline. Lives by himself.  Education 12th.  Children none.  Caffeine tea one cup 4 x week.   Social Drivers of Corporate investment banker Strain: Low Risk  (08/31/2022)   Overall Financial Resource Strain (CARDIA)    Difficulty of Paying Living Expenses: Not hard at all  Food Insecurity: No Food Insecurity (08/31/2022)   Hunger Vital Sign    Worried About Running Out of Food in the Last Year: Never true    Ran Out of Food in the Last Year: Never true  Transportation Needs: No Transportation Needs (08/31/2022)   PRAPARE - Administrator, Civil Service (Medical): No    Lack of Transportation (Non-Medical): No  Physical Activity: Sufficiently Active (08/31/2022)   Exercise Vital Sign    Days of Exercise per Week: 7 days    Minutes of Exercise per Session:  30 min  Stress: No Stress Concern Present (08/31/2022)   [employer] of Occupational Health - Occupational Stress Questionnaire    Feeling of Stress : Not at all  Social Connections: Moderately Integrated (08/31/2022)   Social Connection and Isolation Panel [NHANES]    Frequency of Communication with Friends and Family: More than three times a week    Frequency of Social Gatherings with Friends and Family: Twice a week    Attends Religious Services: More than 4 times per year    Active Member of [employer] or Organizations: Yes    Attends Engineer, structural: More than 4 times per year    Marital Status: Never married    Allergies  Allergen Reactions   Pravastatin  Other (See Comments)    rhabdomyolysis     Current Outpatient Medications:    Accu-Chek Softclix Lancets lancets, Use twice daily to  check blood sugars  E11.69, Disp: 100 each, Rfl: 12   acetaminophen  (TYLENOL ) 500 MG tablet, Take 1,000 mg by mouth every 6 (six) hours as needed for moderate pain or headache., Disp: , Rfl:    allopurinol  (ZYLOPRIM ) 100 MG tablet, TAKE 1 TABLET BY MOUTH EVERY DAY, Disp: 90 tablet, Rfl: 3   aspirin  EC 81 MG tablet, Take 1 tablet (81 mg total) by mouth daily., Disp: 90 tablet, Rfl: 3   Bempedoic Acid  (NEXLETOL ) 180 MG TABS, Take 1 tablet (180 mg total) by mouth daily., Disp: 90 tablet, Rfl: 3   Blood Glucose Monitoring Suppl (ACCU-CHEK GUIDE ME) w/Device KIT, 1 Device by Does not apply route 2 (two) times daily., Disp: 1 kit, Rfl: 0   carvedilol  (COREG ) 6.25 MG tablet, Take 1 tablet (6.25 mg total) by mouth 2 (two) times daily., Disp: 180 tablet, Rfl: 3   cholecalciferol (VITAMIN D3) 25 MCG (1000 UNIT) tablet, Take 1,000 Units by mouth daily., Disp: , Rfl:    empagliflozin  (JARDIANCE ) 10 MG TABS tablet, TAKE 1 TABLET BY MOUTH EVERY DAY BEFORE BREAKFAST, Disp: 90 tablet, Rfl: 1   ezetimibe  (ZETIA ) 10 MG tablet, TAKE 1 TABLET BY MOUTH EVERY DAY, Disp: 90 tablet, Rfl: 3   gabapentin  (NEURONTIN ) 300 MG capsule, TAKE 2 CAPSULES BY MOUTH 3 TIMES A DAY, Disp: 180 capsule, Rfl: 5   loratadine (CLARITIN) 10 MG tablet, Take 10 mg by mouth daily., Disp: , Rfl:    metFORMIN  (GLUCOPHAGE -XR) 500 MG 24 hr tablet, TAKE 1 TABLET BY MOUTH EVERY DAY WITH BREAKFAST, Disp: 90 tablet, Rfl: 1   omeprazole  (PRILOSEC) 20 MG capsule, Take 20 mg by mouth daily., Disp: , Rfl:    rOPINIRole (REQUIP) 1 MG tablet, Take 1.5 mg by mouth 4 (four) times daily., Disp: , Rfl:    sacubitril-valsartan (ENTRESTO ) 24-26 MG, Take 1 tablet by mouth 2 (two) times daily., Disp: 180 tablet, Rfl: 3   spironolactone  (ALDACTONE ) 25 MG tablet, Take 1 tablet (25 mg total) by mouth daily., Disp: 90 tablet, Rfl: 3   traZODone  (DESYREL ) 50 MG tablet, TAKE 0.5-1 TABLETS BY MOUTH AT BEDTIME AS NEEDED FOR SLEEP., Disp: 90 tablet, Rfl: 2   vitamin B-12  (CYANOCOBALAMIN ) 1000 MCG tablet, Take 1,000 mcg by mouth daily., Disp: , Rfl:    darunavir  (PREZISTA ) 800 MG tablet, Take 1 tablet (800 mg total) by mouth daily., Disp: 30 tablet, Rfl: 11   doxycycline  (VIBRA -TABS) 100 MG tablet, Take 1 tablet (100 mg total) by mouth 2 (two) times daily. (Patient not taking: Reported on 06/19/2023), Disp: 14 tablet, Rfl: 0   elvitegravir-cobicistat-emtricitabine-tenofovir (GENVOYA )  150-150-200-10 MG TABS tablet, TAKE 1 TABLET BY MOUTH EVERY DAY WITH BREAKFAST, Disp: 30 tablet, Rfl: 0   glucose blood test strip, Use to check blood sugars twice daily. ICD-10 E11.69, Disp: 100 each, Rfl: 12   glucose blood test strip, Use twice daily to check blood sugars E11.69, Disp: 100 each, Rfl: 12   Lancets (ONETOUCH ULTRASOFT) lancets, Use to check blood sugars twice daily. ICD-10 E11.69, Disp: 100 each, Rfl: 12   omeprazole  (PRILOSEC) 40 MG capsule, Take 1 capsule (40 mg total) by mouth daily for 14 days., Disp: 14 capsule, Rfl: 0   Review of Systems  Constitutional:  Negative for activity change, appetite change, chills, diaphoresis, fatigue, fever and unexpected weight change.  HENT:  Negative for congestion, rhinorrhea, sinus pressure, sneezing, sore throat and trouble swallowing.   Eyes:  Negative for photophobia and visual disturbance.  Respiratory:  Negative for cough, chest tightness, shortness of breath, wheezing and stridor.   Cardiovascular:  Negative for chest pain, palpitations and leg swelling.  Gastrointestinal:  Negative for abdominal distention, abdominal pain, anal bleeding, blood in stool, constipation, diarrhea, nausea and vomiting.  Genitourinary:  Negative for difficulty urinating, dysuria, flank pain and hematuria.  Musculoskeletal:  Negative for arthralgias, back pain, gait problem, joint swelling and myalgias.  Skin:  Negative for color change, pallor, rash and wound.  Neurological:  Negative for dizziness, tremors, weakness and light-headedness.   Hematological:  Negative for adenopathy. Does not bruise/bleed easily.  Psychiatric/Behavioral:  Negative for agitation, behavioral problems, confusion, decreased concentration, dysphoric mood and sleep disturbance.        Objective:   Physical Exam Constitutional:      Appearance: He is well-developed.  HENT:     Head: Normocephalic and atraumatic.  Eyes:     Conjunctiva/sclera: Conjunctivae normal.  Cardiovascular:     Rate and Rhythm: Normal rate and regular rhythm.  Pulmonary:     Effort: Pulmonary effort is normal. No respiratory distress.     Breath sounds: No wheezing.  Abdominal:     General: There is no distension.     Palpations: Abdomen is soft.  Musculoskeletal:        General: No tenderness. Normal range of motion.     Cervical back: Normal range of motion and neck supple.  Skin:    General: Skin is warm and dry.     Coloration: Skin is not pale.     Findings: No erythema or rash.  Neurological:     General: No focal deficit present.     Mental Status: He is alert and oriented to person, place, and time.  Psychiatric:        Mood and Affect: Mood normal.        Behavior: Behavior normal.        Thought Content: Thought content normal.        Judgment: Judgment normal.           Assessment & Plan:   Assessment and Plan    HIV infection, undetectable viral load HIV well-controlled with undetectable viral load and CD4 count of 1214.    --check HIV RNA, CD4 and routine labs - [facility] [place]   Rectal cancer screening:  anal Pap smears for rectal cancer screening due to potential HPV risk. - Order anal Pap smear.  Type 2 diabetes mellitus Diabetes well-managed with Jardiance  and metformin . Hemoglobin A1c at 6.8. Discussed adding GLP-1 receptor agonist for weight loss and improved diabetes  and liver management.  Fatty liver disease Confirmed by ultrasound with elevated liver function tests due to hepatic  steatosis. Discussed weight loss medications to reduce liver fat and improve function. Emphasized weight reduction.  Presence of cardiac pacemaker Pacemaker managed by cardiology with no current issues.  General Health Maintenance Discussed potential COVID vaccinations in the fall.  Follow-Up Agreed to follow up in 10 months per [facility] program guidelines.     STI screen: he has not been sexually active since 2017 but we will check urine GC per RW guidelinesa nd RPR

## 2023-06-20 LAB — C. TRACHOMATIS/N. GONORRHOEAE RNA
C. trachomatis RNA, TMA: NOT DETECTED
N. gonorrhoeae RNA, TMA: NOT DETECTED

## 2023-06-21 LAB — CBC WITH DIFFERENTIAL/PLATELET
Absolute Lymphocytes: 2901 {cells}/uL (ref 850–3900)
Absolute Monocytes: 560 {cells}/uL (ref 200–950)
Basophils Absolute: 62 {cells}/uL (ref 0–200)
Basophils Relative: 1.1 %
Eosinophils Absolute: 386 {cells}/uL (ref 15–500)
Eosinophils Relative: 6.9 %
HCT: 43.8 % (ref 38.5–50.0)
Hemoglobin: 14.7 g/dL (ref 13.2–17.1)
MCH: 33.3 pg — ABNORMAL HIGH (ref 27.0–33.0)
MCHC: 33.6 g/dL (ref 32.0–36.0)
MCV: 99.1 fL (ref 80.0–100.0)
MPV: 9.7 fL (ref 7.5–12.5)
Monocytes Relative: 10 %
Neutro Abs: 1691 {cells}/uL (ref 1500–7800)
Neutrophils Relative %: 30.2 %
Platelets: 425 10*3/uL — ABNORMAL HIGH (ref 140–400)
RBC: 4.42 10*6/uL (ref 4.20–5.80)
RDW: 12.7 % (ref 11.0–15.0)
Total Lymphocyte: 51.8 %
WBC: 5.6 10*3/uL (ref 3.8–10.8)

## 2023-06-21 LAB — COMPLETE METABOLIC PANEL WITHOUT GFR
AG Ratio: 1.3 (calc) (ref 1.0–2.5)
ALT: 19 U/L (ref 9–46)
AST: 21 U/L (ref 10–35)
Albumin: 4.6 g/dL (ref 3.6–5.1)
Alkaline phosphatase (APISO): 40 U/L (ref 35–144)
BUN: 19 mg/dL (ref 7–25)
CO2: 27 mmol/L (ref 20–32)
Calcium: 9.7 mg/dL (ref 8.6–10.3)
Chloride: 101 mmol/L (ref 98–110)
Creat: 1.31 mg/dL (ref 0.70–1.35)
Globulin: 3.5 g/dL (ref 1.9–3.7)
Glucose, Bld: 140 mg/dL — ABNORMAL HIGH (ref 65–99)
Potassium: 4.7 mmol/L (ref 3.5–5.3)
Sodium: 137 mmol/L (ref 135–146)
Total Bilirubin: 0.6 mg/dL (ref 0.2–1.2)
Total Protein: 8.1 g/dL (ref 6.1–8.1)

## 2023-06-21 LAB — LIPID PANEL
Cholesterol: 173 mg/dL (ref <200)
HDL: 42 mg/dL (ref >=40)
LDL Cholesterol (Calc): 101 mg/dL — ABNORMAL HIGH
Non-HDL Cholesterol (Calc): 131 mg/dL — ABNORMAL HIGH (ref <130)
Total CHOL/HDL Ratio: 4.1 (calc) (ref <5.0)
Triglycerides: 178 mg/dL — ABNORMAL HIGH (ref <150)

## 2023-06-21 LAB — T-HELPER CELLS (CD4) COUNT (NOT AT ARMC)
Absolute CD4: 1177 {cells}/uL (ref 490–1740)
CD4 T Helper %: 40 % (ref 30–61)
Total lymphocyte count: 2972 {cells}/uL (ref 850–3900)

## 2023-06-21 LAB — RPR: RPR Ser Ql: REACTIVE — AB

## 2023-06-21 LAB — HIV-1 RNA QUANT-NO REFLEX-BLD
HIV 1 RNA Quant: NOT DETECTED {copies}/mL
HIV-1 RNA Quant, Log: NOT DETECTED {Log_copies}/mL

## 2023-06-21 LAB — T PALLIDUM AB: T Pallidum Abs: POSITIVE — AB

## 2023-06-21 LAB — RPR TITER: RPR Titer: 1:2 {titer} — ABNORMAL HIGH

## 2023-06-22 LAB — CYTOLOGY - NON PAP

## 2023-06-27 DIAGNOSIS — Z950 Presence of cardiac pacemaker: Secondary | ICD-10-CM | POA: Diagnosis not present

## 2023-06-27 DIAGNOSIS — G4733 Obstructive sleep apnea (adult) (pediatric): Secondary | ICD-10-CM | POA: Diagnosis not present

## 2023-06-27 DIAGNOSIS — Z8679 Personal history of other diseases of the circulatory system: Secondary | ICD-10-CM | POA: Diagnosis not present

## 2023-06-27 DIAGNOSIS — I5022 Chronic systolic (congestive) heart failure: Secondary | ICD-10-CM | POA: Diagnosis not present

## 2023-06-27 DIAGNOSIS — I129 Hypertensive chronic kidney disease with stage 1 through stage 4 chronic kidney disease, or unspecified chronic kidney disease: Secondary | ICD-10-CM | POA: Diagnosis not present

## 2023-06-27 DIAGNOSIS — I442 Atrioventricular block, complete: Secondary | ICD-10-CM | POA: Diagnosis not present

## 2023-06-27 DIAGNOSIS — I10 Essential (primary) hypertension: Secondary | ICD-10-CM | POA: Diagnosis not present

## 2023-06-27 DIAGNOSIS — E1169 Type 2 diabetes mellitus with other specified complication: Secondary | ICD-10-CM | POA: Diagnosis not present

## 2023-06-27 DIAGNOSIS — E785 Hyperlipidemia, unspecified: Secondary | ICD-10-CM | POA: Diagnosis not present

## 2023-06-27 DIAGNOSIS — Z45018 Encounter for adjustment and management of other part of cardiac pacemaker: Secondary | ICD-10-CM | POA: Diagnosis not present

## 2023-06-27 DIAGNOSIS — I350 Nonrheumatic aortic (valve) stenosis: Secondary | ICD-10-CM | POA: Diagnosis not present

## 2023-07-01 ENCOUNTER — Other Ambulatory Visit: Payer: Self-pay | Admitting: Infectious Disease

## 2023-07-01 DIAGNOSIS — B20 Human immunodeficiency virus [HIV] disease: Secondary | ICD-10-CM

## 2023-07-02 ENCOUNTER — Other Ambulatory Visit: Payer: Self-pay

## 2023-07-02 DIAGNOSIS — B20 Human immunodeficiency virus [HIV] disease: Secondary | ICD-10-CM

## 2023-07-02 MED ORDER — GENVOYA 150-150-200-10 MG PO TABS: ORAL_TABLET | ORAL | 11 refills | Status: AC

## 2023-07-10 ENCOUNTER — Ambulatory Visit (INDEPENDENT_AMBULATORY_CARE_PROVIDER_SITE_OTHER)

## 2023-07-10 VITALS — BP 96/84 | HR 75 | Ht 71.0 in | Wt 230.6 lb

## 2023-07-10 DIAGNOSIS — K219 Gastro-esophageal reflux disease without esophagitis: Secondary | ICD-10-CM | POA: Diagnosis not present

## 2023-07-10 DIAGNOSIS — R1013 Epigastric pain: Secondary | ICD-10-CM | POA: Diagnosis not present

## 2023-07-10 MED ORDER — PANTOPRAZOLE SODIUM 40 MG PO TBEC: DELAYED_RELEASE_TABLET | ORAL | 0 refills | Status: DC

## 2023-07-10 NOTE — Assessment & Plan Note (Addendum)
 Currently taking Omeprazole  20 mg daily. D/C  Trial of Pantoprazole 40 mg twice a day for 14 days, then once a day with goal of 40 mg Pantoprazole prn.  Discuss potential need for EGD, GI evaluation with the patient.

## 2023-07-10 NOTE — Patient Instructions (Signed)
-   Take Protonix 40 mg twice a day for next 14 days then 40 mg once a day after that. You can take 40 mg once a day for about 20-30 days then transition to as needed for acid reflux.   - We are checking labs including lipase, kidney, liver, electrolytes level. If all of these comes back normal, recommend getting CT abdomen and referral to GI for further evaluation.   - Eat nutrition rich food, smaller portion. Avoid eating fatty meals. Stay hydrated push for 50-60 oz of water daily.

## 2023-07-10 NOTE — Progress Notes (Signed)
 Acute Office Visit  Subjective:    Patient ID: Adam Becker, male    DOB: 07-16-1955, 68 y.o.   MRN: [id number]  Chief Complaint  Patient presents with   Abdominal Pain   Patient is in today for following acute concern:  Pressure like sensation on epigastric area for about 2 months, mostly at night. He also endorses tinglings/electrical like sensation on left lower ribs that started about 3 days ago. Mostly noticeable at bed time. Patient denies chest pain.  He has a h/o GERD. He take Pantoprazole 20 mg, over the counter every day.  He also reports of early satiety for about 2-3 weeks. Sensation of food stuck in his abdomen for few months.  Has a h/o HIV on antiviral medication. F/U with ID (Dr. Blain Bulls).  Over the last month: No unintentional weight loss. No nausea, vomiting, change in bowel habit (no diarrhea, constipation) No blood in stool.  He does not take NSAIDs. No recent change in diet. d    As per HPI    Objective:    BP 96/84   Pulse 75   Ht 5' 11 (1.803 m)   Wt 230 lb 9.6 oz (104.6 kg)   SpO2 96%   BMI 32.16 kg/m    Physical Exam Constitutional:      Appearance: Normal appearance. He is obese.  HENT:     Head: Normocephalic and atraumatic.     Mouth/Throat:     Mouth: Mucous membranes are moist.  Neck:     Thyroid : No thyroid  mass or thyroid  tenderness.   Cardiovascular:     Rate and Rhythm: Normal rate and regular rhythm.  Pulmonary:     Effort: Pulmonary effort is normal.     Breath sounds: Normal breath sounds.  Abdominal:     General: Abdomen is protuberant. Bowel sounds are normal.     Palpations: Abdomen is soft.     Tenderness: There is abdominal tenderness. There is no guarding or rebound. Negative signs include McBurney's sign.     Comments: Linear healed surgical scar on mid abdomin noted.    Musculoskeletal:     Cervical back: Neck supple. No rigidity.     Right lower leg: No edema.     Left lower leg: No edema.   Skin:     General: Skin is warm.   Neurological:     Mental Status: He is alert and oriented to person, place, and time.   Psychiatric:        Mood and Affect: Mood normal.        Behavior: Behavior normal.     No results found for any visits on 07/10/23.     Assessment & Plan:  Pleasant 68 year old male with medical h/o MASLD, HIV on HAART, GERD presenting for epigastric pain, GERD evaluation.   Gastroesophageal reflux disease, unspecified whether esophagitis present Assessment & Plan: Currently taking Omeprazole  20 mg daily. D/C  Trial of Pantoprazole 40 mg twice a day for 14 days, then once a day with goal of 40 mg Pantoprazole prn.  Discuss potential need for EGD, GI evaluation with the patient.   Orders: -     Pantoprazole Sodium; Take 1 tablet (40 mg total) by mouth 2 (two) times daily for 14 days, THEN 1 tablet (40 mg total) daily for 30 days, THEN 1 tablet (40 mg total) daily for 30 days, THEN 1 tablet (40 mg total) daily as needed.  Dispense: 60 tablet; Refill: 0  Epigastric  abdominal pain Assessment & Plan: D/D includes GERD, esophagitis (CMV), PUD, pancreatitis, HAART related side effect.  Will obtain CMP, CBC, lipase. If all labs normal recommend GI follow up for potential EGD and CT abdomen.  Patient agreeable.   Orders: -     Comprehensive metabolic panel with GFR -     Lipase -     CBC with Differential/Platelet   Return for 6-8 weeks with PCP Dr. Madelon Scheuermann .  Jacklin Mascot, MD

## 2023-07-10 NOTE — Assessment & Plan Note (Signed)
 D/D includes GERD, esophagitis (CMV), PUD, pancreatitis, HAART related side effect.  Will obtain CMP, CBC, lipase. If all labs normal recommend GI follow up for potential EGD and CT abdomen.  Patient agreeable.

## 2023-07-11 ENCOUNTER — Ambulatory Visit: Payer: Self-pay

## 2023-07-11 DIAGNOSIS — R7989 Other specified abnormal findings of blood chemistry: Secondary | ICD-10-CM | POA: Insufficient documentation

## 2023-07-11 LAB — COMPREHENSIVE METABOLIC PANEL WITH GFR
ALT: 21 U/L (ref 0–53)
AST: 22 U/L (ref 0–37)
Albumin: 4.5 g/dL (ref 3.5–5.2)
Alkaline Phosphatase: 36 U/L — ABNORMAL LOW (ref 39–117)
BUN: 24 mg/dL — ABNORMAL HIGH (ref 6–23)
CO2: 29 meq/L (ref 19–32)
Calcium: 9.6 mg/dL (ref 8.4–10.5)
Chloride: 102 meq/L (ref 96–112)
Creatinine, Ser: 1.69 mg/dL — ABNORMAL HIGH (ref 0.40–1.50)
GFR: 41.71 mL/min — ABNORMAL LOW (ref >60.00)
Glucose, Bld: 118 mg/dL — ABNORMAL HIGH (ref 70–99)
Potassium: 4.7 meq/L (ref 3.5–5.1)
Sodium: 138 meq/L (ref 135–145)
Total Bilirubin: 0.4 mg/dL (ref 0.2–1.2)
Total Protein: 8 g/dL (ref 6.0–8.3)

## 2023-07-11 LAB — CBC WITH DIFFERENTIAL/PLATELET
Basophils Absolute: 0 10*3/uL (ref 0.0–0.1)
Basophils Relative: 0.4 % (ref 0.0–3.0)
Eosinophils Absolute: 0.4 10*3/uL (ref 0.0–0.7)
Eosinophils Relative: 6.6 % — ABNORMAL HIGH (ref 0.0–5.0)
HCT: 42.8 % (ref 39.0–52.0)
Hemoglobin: 14.5 g/dL (ref 13.0–17.0)
Lymphocytes Relative: 46.9 % — ABNORMAL HIGH (ref 12.0–46.0)
Lymphs Abs: 3.2 10*3/uL (ref 0.7–4.0)
MCHC: 34 g/dL (ref 30.0–36.0)
MCV: 100.6 fl — ABNORMAL HIGH (ref 78.0–100.0)
Monocytes Absolute: 0.8 10*3/uL (ref 0.1–1.0)
Monocytes Relative: 11.6 % (ref 3.0–12.0)
Neutro Abs: 2.3 10*3/uL (ref 1.4–7.7)
Neutrophils Relative %: 34.5 % — ABNORMAL LOW (ref 43.0–77.0)
Platelets: 389 10*3/uL (ref 150.0–400.0)
RBC: 4.25 Mil/uL (ref 4.22–5.81)
RDW: 13.6 % (ref 11.5–15.5)
WBC: 6.8 10*3/uL (ref 4.0–10.5)

## 2023-07-11 LAB — LIPASE: Lipase: 41 U/L (ref 11.0–59.0)

## 2023-07-11 NOTE — Progress Notes (Signed)
 Please let the patient's lab does not show obvious reason behind abdominal pain. I recommend reaching out to gastroenterologist Dr. Geralyn Knee Cunningham's office to schedule an appointment with him. Patient is already established with him.   Blood work showed patient's creatinine to be elevated from baseline. I recommend focusing on drinking about 50-60 oz of water daily and repeat BMP in 1 week. Lab ordered.   Thank you,  Jacklin Mascot, MD

## 2023-07-12 ENCOUNTER — Other Ambulatory Visit: Payer: Self-pay

## 2023-07-12 ENCOUNTER — Other Ambulatory Visit: Payer: Self-pay | Admitting: Internal Medicine

## 2023-07-12 ENCOUNTER — Telehealth: Payer: Self-pay

## 2023-07-12 DIAGNOSIS — K219 Gastro-esophageal reflux disease without esophagitis: Secondary | ICD-10-CM

## 2023-07-12 NOTE — Telephone Encounter (Signed)
 Received voicemail from patient requesting refills of Prezista . Called Jemiah back, no answer.   Left HIPAA compliant generic voicemail stating that [facility] of [facility] [place] and to please call if he is still having trouble getting his medication.   Jazmin Ley, BSN, RN

## 2023-07-16 NOTE — Telephone Encounter (Signed)
 Historical medication Last OV: 12/26/2022 Next OV: 08/30/2023

## 2023-07-19 ENCOUNTER — Other Ambulatory Visit

## 2023-07-19 DIAGNOSIS — R7989 Other specified abnormal findings of blood chemistry: Secondary | ICD-10-CM | POA: Diagnosis not present

## 2023-07-19 LAB — BASIC METABOLIC PANEL WITH GFR
BUN: 21 mg/dL (ref 6–23)
CO2: 29 meq/L (ref 19–32)
Calcium: 9.4 mg/dL (ref 8.4–10.5)
Chloride: 101 meq/L (ref 96–112)
Creatinine, Ser: 1.46 mg/dL (ref 0.40–1.50)
GFR: 49.7 mL/min — ABNORMAL LOW (ref >60.00)
Glucose, Bld: 161 mg/dL — ABNORMAL HIGH (ref 70–99)
Potassium: 4.7 meq/L (ref 3.5–5.1)
Sodium: 137 meq/L (ref 135–145)

## 2023-07-21 ENCOUNTER — Ambulatory Visit: Payer: Self-pay

## 2023-07-21 NOTE — Progress Notes (Signed)
 Please let the patient know his kidney function is improved compared to his last lab. No further recommendations and follow up with PCP as scheduled.   Thank you,  Luke Shade, MD

## 2023-07-25 ENCOUNTER — Other Ambulatory Visit: Payer: Self-pay

## 2023-07-25 ENCOUNTER — Other Ambulatory Visit: Payer: Self-pay | Admitting: Infectious Disease

## 2023-07-25 DIAGNOSIS — B20 Human immunodeficiency virus [HIV] disease: Secondary | ICD-10-CM

## 2023-07-25 DIAGNOSIS — K219 Gastro-esophageal reflux disease without esophagitis: Secondary | ICD-10-CM

## 2023-07-25 MED ORDER — DARUNAVIR 800 MG PO TABS
800.0000 mg | ORAL_TABLET | Freq: Every day | ORAL | 10 refills | Status: AC
Start: 2023-07-25 — End: ?

## 2023-08-30 ENCOUNTER — Encounter: Payer: Self-pay | Admitting: Internal Medicine

## 2023-08-30 ENCOUNTER — Ambulatory Visit (INDEPENDENT_AMBULATORY_CARE_PROVIDER_SITE_OTHER): Admitting: Internal Medicine

## 2023-08-30 ENCOUNTER — Telehealth: Payer: Self-pay

## 2023-08-30 VITALS — BP 106/64 | HR 71 | Temp 97.7°F | Ht 71.0 in | Wt 231.4 lb

## 2023-08-30 DIAGNOSIS — B20 Human immunodeficiency virus [HIV] disease: Secondary | ICD-10-CM | POA: Diagnosis not present

## 2023-08-30 DIAGNOSIS — I152 Hypertension secondary to endocrine disorders: Secondary | ICD-10-CM | POA: Diagnosis not present

## 2023-08-30 DIAGNOSIS — K7581 Nonalcoholic steatohepatitis (NASH): Secondary | ICD-10-CM | POA: Diagnosis not present

## 2023-08-30 DIAGNOSIS — K219 Gastro-esophageal reflux disease without esophagitis: Secondary | ICD-10-CM

## 2023-08-30 DIAGNOSIS — G629 Polyneuropathy, unspecified: Secondary | ICD-10-CM | POA: Diagnosis not present

## 2023-08-30 DIAGNOSIS — E1159 Type 2 diabetes mellitus with other circulatory complications: Secondary | ICD-10-CM | POA: Diagnosis not present

## 2023-08-30 DIAGNOSIS — E1169 Type 2 diabetes mellitus with other specified complication: Secondary | ICD-10-CM

## 2023-08-30 DIAGNOSIS — E7800 Pure hypercholesterolemia, unspecified: Secondary | ICD-10-CM | POA: Diagnosis not present

## 2023-08-30 DIAGNOSIS — Z6832 Body mass index (BMI) 32.0-32.9, adult: Secondary | ICD-10-CM | POA: Diagnosis not present

## 2023-08-30 DIAGNOSIS — R1319 Other dysphagia: Secondary | ICD-10-CM

## 2023-08-30 DIAGNOSIS — E669 Obesity, unspecified: Secondary | ICD-10-CM

## 2023-08-30 DIAGNOSIS — I10 Essential (primary) hypertension: Secondary | ICD-10-CM

## 2023-08-30 DIAGNOSIS — I69391 Dysphagia following cerebral infarction: Secondary | ICD-10-CM

## 2023-08-30 LAB — POCT GLYCOSYLATED HEMOGLOBIN (HGB A1C): Hemoglobin A1C: 6.1 % — AB (ref 4.0–5.6)

## 2023-08-30 LAB — TSH: TSH: 0.69 u[IU]/mL (ref 0.35–5.50)

## 2023-08-30 MED ORDER — PANTOPRAZOLE SODIUM 40 MG PO TBEC: 40.0000 mg | DELAYED_RELEASE_TABLET | Freq: Every day | ORAL | 3 refills | Status: DC

## 2023-08-30 MED ORDER — TELMISARTAN-HCTZ 80-12.5 MG PO TABS: 1.0000 | ORAL_TABLET | Freq: Every day | ORAL | 1 refills | Status: DC

## 2023-08-30 MED ORDER — GABAPENTIN 300 MG PO CAPS: 600.0000 mg | ORAL_CAPSULE | Freq: Three times a day (TID) | ORAL | 5 refills | Status: AC

## 2023-08-30 NOTE — Patient Instructions (Signed)
 Your swallowing issue may be due to esophageal spasm or esophageal stricture   I am ordering a DG esophagus to evaluate your swallowing issues.   This is an x ray of your esophagus during different phases of swallowing.  You will be asked to drink a chalky substance and pictures will be taken during your swallowing to see if there is a stricture.  You either walk in to [facility] and request the test  or  call [phone] to set this up at [facility], o  .   Please try to get this done BEFORE your GI appt next Wednesday so the report will be available for your visit    Pease return in 3 months.

## 2023-08-30 NOTE — Assessment & Plan Note (Addendum)
 Reviewed last visit with ID May 2025   notes from OV summarized here:  HIV well-controlled with undetectable viral load and CD4 count of 1214. SABRA  Continue Genvoya  and Prezista   Lab Results  Component Value Date   HIV1RNAQUANT NOT DETECTED 06/19/2023    Lab Results  Component Value Date   CD4TCELL 40 06/19/2023   CD4TABS 1,214 06/07/2022

## 2023-08-30 NOTE — Assessment & Plan Note (Signed)
 well-controlled on current medications.  hemoglobin A1c  is now  less than 7.0 . Patient is up-to-date on eye exams and foot exam is normal today. Patient has increased  urine microalbumin to creatinine ratio abd us  taking an ARB and SGLT 2 inhibitor  with good tolerance . 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  Adding hctz   Lab Results  Component Value Date   HGBA1C 6.8 (A) 12/26/2022   Lab Results  Component Value Date   MICROALBUR 47.1 (H) 10/29/2014   MICROALBUR 5.9 (H) 04/28/2014

## 2023-08-30 NOTE — Telephone Encounter (Signed)
 Copied from CRM [id number]. Topic: Clinical - Medication Question >> Aug 30, 2023  3:16 PM Chasity T wrote: Reason for CRM: Patient is calling to request a callback for medication telmisartan -hydrochlorothiazide  (MICARDIS  HCT) 80-12.5 MG tablet stating that he does not take that medication anymore and is confused o if Dr Marylynn wants to start back taking it or not. Please contact him back regarding concerns.

## 2023-08-30 NOTE — Telephone Encounter (Signed)
 Noted and pt is aware.

## 2023-08-30 NOTE — Progress Notes (Signed)
 Subjective:  Patient ID: Adam Becker, male    DOB: Aug 20, 1955  Age: 68 y.o. MRN: [id number]  CC: The primary encounter diagnosis was Obesity, diabetes, and hypertension syndrome (HCC). Diagnoses of Neuropathy, Pure hypercholesterolemia, Human immunodeficiency virus (HIV) disease (HCC), Esophageal dysphagia, Gastroesophageal reflux disease, unspecified whether esophagitis present, Hypertension, unspecified type, Dysphagia as late effect of stroke, and Metabolic dysfunction-associated steatohepatitis (MASH) were also pertinent to this visit.   HPI Adam Becker presents for  Chief Complaint  Patient presents with   Medical Management of Chronic Issues   Seen June 17 by Dr Abbey for epigastric /upper GI pain.  Lipase, labs normal .  PPI increased to 40 mg  .  Still having occasional episodes of food stopping in mid esophagus  accompanied by pressure if he bends over.  . He has an appt with GI next week   Type 2 DM:  He feels generally well, is exercising several times per week and checking blood sugars once daily at variable times.  BS have been under 130 fasting.    Denies any recent hypoglyemic events.  Taking his medications as directed. Following a carbohydrate modified diet 6 days per week. Denies numbness, burning and tingling of extremities. Appetite is good.        Fatty liver : noted on May 2025 ultrasound done 2/2 elevated liver enzymes, which have normalized per June labs.   Lab Results  Component Value Date   ALT 17 08/30/2023   AST 19 08/30/2023   ALKPHOS 41 08/30/2023   BILITOT 0.4 08/30/2023     Outpatient Medications Prior to Visit  Medication Sig Dispense Refill   Accu-Chek Softclix Lancets lancets Use twice daily to check blood sugars  E11.69 100 each 12   acetaminophen  (TYLENOL ) 500 MG tablet Take 1,000 mg by mouth every 6 (six) hours as needed for moderate pain or headache.     allopurinol  (ZYLOPRIM ) 100 MG tablet TAKE 1 TABLET BY MOUTH EVERY DAY 90  tablet 3   aspirin  EC 81 MG tablet Take 1 tablet (81 mg total) by mouth daily. 90 tablet 3   Bempedoic Acid  (NEXLETOL ) 180 MG TABS Take 1 tablet (180 mg total) by mouth daily. 90 tablet 3   Blood Glucose Monitoring Suppl (ACCU-CHEK GUIDE ME) w/Device KIT 1 Device by Does not apply route 2 (two) times daily. 1 kit 0   carvedilol  (COREG ) 6.25 MG tablet Take 1 tablet (6.25 mg total) by mouth 2 (two) times daily. 180 tablet 3   cholecalciferol (VITAMIN D3) 25 MCG (1000 UNIT) tablet Take 1,000 Units by mouth daily.     darunavir  (PREZISTA ) 800 MG tablet Take 1 tablet (800 mg total) by mouth daily. 30 tablet 10   elvitegravir-cobicistat-emtricitabine-tenofovir (GENVOYA ) 150-150-200-10 MG TABS tablet TAKE 1 TABLET BY MOUTH EVERY DAY WITH BREAKFAST 30 tablet 11   empagliflozin  (JARDIANCE ) 10 MG TABS tablet TAKE 1 TABLET BY MOUTH EVERY DAY BEFORE BREAKFAST 90 tablet 1   ezetimibe  (ZETIA ) 10 MG tablet TAKE 1 TABLET BY MOUTH EVERY DAY 90 tablet 3   glucose blood test strip Use to check blood sugars twice daily. ICD-10 E11.69 100 each 12   glucose blood test strip Use twice daily to check blood sugars E11.69 100 each 12   Lancets (ONETOUCH ULTRASOFT) lancets Use to check blood sugars twice daily. ICD-10 E11.69 100 each 12   loratadine (CLARITIN) 10 MG tablet Take 10 mg by mouth daily.     metFORMIN  (GLUCOPHAGE -XR) 500 MG 24 hr  tablet TAKE 1 TABLET BY MOUTH EVERY DAY WITH BREAKFAST 90 tablet 1   rOPINIRole (REQUIP) 1 MG tablet Take 1.5 mg by mouth 4 (four) times daily.     sacubitril-valsartan (ENTRESTO ) 24-26 MG Take 1 tablet by mouth 2 (two) times daily. 180 tablet 3   spironolactone  (ALDACTONE ) 25 MG tablet Take 1 tablet (25 mg total) by mouth daily. 90 tablet 3   traZODone  (DESYREL ) 50 MG tablet TAKE 1/2 TO 1 TABLET BY MOUTH AT BEDTIME AS NEEDED FOR SLEEP 90 tablet 2   vitamin B-12 (CYANOCOBALAMIN ) 1000 MCG tablet Take 1,000 mcg by mouth daily.     gabapentin  (NEURONTIN ) 300 MG capsule TAKE 2 CAPSULES BY  MOUTH 3 TIMES A DAY 180 capsule 5   pantoprazole  (PROTONIX ) 40 MG tablet Take 1 tablet (40 mg total) by mouth 2 (two) times daily for 14 days, THEN 1 tablet (40 mg total) daily for 30 days, THEN 1 tablet (40 mg total) daily for 30 days, THEN 1 tablet (40 mg total) daily as needed. 60 tablet 0   telmisartan -hydrochlorothiazide  (MICARDIS  HCT) 80-12.5 MG tablet TAKE 1 TABLET BY MOUTH EVERY DAY 90 tablet 0   No facility-administered medications prior to visit.    Review of Systems;  Patient denies headache, fevers, malaise, unintentional weight loss, skin rash, eye pain, sinus congestion and sinus pain, sore throat,   hemoptysis , cough, dyspnea, wheezing, chest pain, palpitations, orthopnea, edema, abdominal pain, nausea, melena, diarrhea, constipation, flank pain, dysuria, hematuria, urinary  Frequency, nocturia, numbness, tingling, seizures,  Focal weakness, Loss of consciousness,  Tremor, insomnia, depression, anxiety, and suicidal ideation.      Objective:  BP 106/64   Pulse 71   Temp 97.7 F (36.5 C) (Oral)   Ht 5' 11 (1.803 m)   Wt 231 lb 6.4 oz (105 kg)   SpO2 95%   BMI 32.27 kg/m   BP Readings from Last 3 Encounters:  08/30/23 106/64  07/10/23 96/84  06/19/23 116/78    Wt Readings from Last 3 Encounters:  08/30/23 231 lb 6.4 oz (105 kg)  07/10/23 230 lb 9.6 oz (104.6 kg)  06/19/23 230 lb (104.3 kg)    Physical Exam Vitals reviewed.  Constitutional:      General: He is not in acute distress.    Appearance: Normal appearance. He is normal weight. He is not ill-appearing, toxic-appearing or diaphoretic.  HENT:     Head: Normocephalic.  Eyes:     General: No scleral icterus.       Right eye: No discharge.        Left eye: No discharge.     Conjunctiva/sclera: Conjunctivae normal.  Cardiovascular:     Rate and Rhythm: Normal rate and regular rhythm.     Heart sounds: Normal heart sounds.  Pulmonary:     Effort: Pulmonary effort is normal. No respiratory distress.      Breath sounds: Normal breath sounds.  Musculoskeletal:        General: Normal range of motion.     Cervical back: Normal range of motion.  Skin:    General: Skin is warm and dry.  Neurological:     General: No focal deficit present.     Mental Status: He is alert and oriented to person, place, and time. Mental status is at baseline.  Psychiatric:        Mood and Affect: Mood normal.        Behavior: Behavior normal.        Thought  Content: Thought content normal.        Judgment: Judgment normal.     Lab Results  Component Value Date   HGBA1C 6.1 (A) 08/30/2023   HGBA1C 6.8 (A) 12/26/2022   HGBA1C 6.7 (A) 06/20/2022    Lab Results  Component Value Date   CREATININE 1.33 08/30/2023   CREATININE 1.46 07/19/2023   CREATININE 1.69 (H) 07/10/2023    Lab Results  Component Value Date   WBC 6.8 07/10/2023   HGB 14.5 07/10/2023   HCT 42.8 07/10/2023   PLT 389.0 07/10/2023   GLUCOSE 171 (H) 08/30/2023   CHOL 170 08/30/2023   TRIG 212.0 (H) 08/30/2023   HDL 39.80 08/30/2023   LDLDIRECT 104.0 08/30/2023   LDLCALC 88 08/30/2023   ALT 17 08/30/2023   AST 19 08/30/2023   NA 141 08/30/2023   K 4.2 08/30/2023   CL 103 08/30/2023   CREATININE 1.33 08/30/2023   BUN 16 08/30/2023   CO2 26 08/30/2023   TSH 0.69 08/30/2023   PSA 0.91 02/20/2023   HGBA1C 6.1 (A) 08/30/2023   MICROALBUR 0.7 08/30/2023    US  Abdomen Limited RUQ (LIVER/GB) Result Date: 05/28/2023 CLINICAL DATA:  Elevarted liver enzymes EXAM: ULTRASOUND ABDOMEN LIMITED RIGHT UPPER QUADRANT COMPARISON:  November 26, 2011 FINDINGS: Gallbladder: No gallstones or wall thickening visualized. No sonographic Murphy sign noted by sonographer. Common bile duct: Diameter: Visualized portion measures 4 mm, within normal limits. Liver: No focal lesion identified. Diffusely increased in parenchymal echogenicity with poor acoustic penetration. Portal vein is patent on color Doppler imaging with normal direction of blood flow  towards the liver. Other: None. IMPRESSION: Hepatic steatosis. Electronically Signed   By: Corean Salter M.D.   On: 05/28/2023 12:43    Assessment & Plan:  .Obesity, diabetes, and hypertension syndrome (HCC) Assessment & Plan:  well-controlled on current medications.  hemoglobin A1c  is now  less than 7.0 . Patient is up-to-date on eye exams and foot exam is normal today. Patient has increased  urine microalbumin to creatinine ratio abd us  taking an ARB and SGLT 2 inhibitor  with good tolerance . 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  Adding hctz   Lab Results  Component Value Date   HGBA1C 6.8 (A) 12/26/2022   Lab Results  Component Value Date   MICROALBUR 47.1 (H) 10/29/2014   MICROALBUR 5.9 (H) 04/28/2014       Orders: -     Microalbumin / creatinine urine ratio -     Comprehensive metabolic panel with GFR -     TSH -     POCT glycosylated hemoglobin (Hb A1C)  Neuropathy -     Gabapentin ; Take 2 capsules (600 mg total) by mouth 3 (three) times daily.  Dispense: 180 capsule; Refill: 5  Pure hypercholesterolemia -     Lipid panel -     LDL cholesterol, direct  Human immunodeficiency virus (HIV) disease (HCC) Assessment & Plan: Reviewed last visit with ID May 2025   notes from OV summarized here:  HIV well-controlled with undetectable viral load and CD4 count of 1214. SABRA  Continue Genvoya  and Prezista   Lab Results  Component Value Date   HIV1RNAQUANT NOT DETECTED 06/19/2023    Lab Results  Component Value Date   CD4TCELL 40 06/19/2023   CD4TABS 1,214 06/07/2022      Esophageal dysphagia Assessment & Plan: DG esophagus recommended to assess for stricture, achalasia,  prior  to next weeks's GI appt  Orders: -     DG ESOPHAGUS W SINGLE CM (SOL OR THIN BA); Future  Gastroesophageal reflux disease, unspecified whether esophagitis present  Hypertension, unspecified type  Dysphagia as late  effect of stroke Assessment & Plan: DG esophagus recommended to assess for stricture, achalasia,  prior  to next weeks's GI appt   Metabolic dysfunction-associated steatohepatitis (MASH) Assessment & Plan: Accomanied by T2DM and obesity.  Reviewed potential risks and benefits of GLP 1 agonists.  Deferring until GI evaluation is complete.    Other orders -     Pantoprazole  Sodium; Take 1 tablet (40 mg total) by mouth daily.  Dispense: 30 tablet; Refill: 3     I spent 34 minutes on the day of this face to face encounter reviewing patient's  most recent visit with cardiology,  nephrology,  and neurology,  prior relevant surgical and non surgical procedures, recent  labs and imaging studies, counseling on weight management,  reviewing the assessment and plan with patient, and post visit ordering and reviewing of  diagnostics and therapeutics with patient  .   Follow-up: Return in about 3 months (around 11/29/2023) for follow up diabetes.   Adam LITTIE Kettering, MD

## 2023-08-30 NOTE — Telephone Encounter (Signed)
 Spoke with pt to let him know that the medication was refilled because it was still on his medication list as a medication he was taking and it was due for refill. When reviewing medications with pt today during visit he stated that he was still taking everything on his list. I advised pt that if he has been off of that medication to not start back on it unless Dr Marylynn advises him to do so. Pt stated that he has been off of the medication for quite and while now. I have discontinued medication from med list so it will not get refilled again.

## 2023-08-30 NOTE — Addendum Note (Signed)
 Addended by: HARRIETTE RAISIN on: 08/30/2023 04:40 PM   Modules accepted: Orders

## 2023-08-31 LAB — MICROALBUMIN / CREATININE URINE RATIO
Creatinine,U: 72.7 mg/dL
Microalb Creat Ratio: 10.1 mg/g (ref 0.0–30.0)
Microalb, Ur: 0.7 mg/dL (ref 0.0–1.9)

## 2023-08-31 LAB — COMPREHENSIVE METABOLIC PANEL WITH GFR
ALT: 17 U/L (ref 0–53)
AST: 19 U/L (ref 0–37)
Albumin: 4.5 g/dL (ref 3.5–5.2)
Alkaline Phosphatase: 41 U/L (ref 39–117)
BUN: 16 mg/dL (ref 6–23)
CO2: 26 meq/L (ref 19–32)
Calcium: 9.7 mg/dL (ref 8.4–10.5)
Chloride: 103 meq/L (ref 96–112)
Creatinine, Ser: 1.33 mg/dL (ref 0.40–1.50)
GFR: 55.54 mL/min — ABNORMAL LOW (ref >60.00)
Glucose, Bld: 171 mg/dL — ABNORMAL HIGH (ref 70–99)
Potassium: 4.2 meq/L (ref 3.5–5.1)
Sodium: 141 meq/L (ref 135–145)
Total Bilirubin: 0.4 mg/dL (ref 0.2–1.2)
Total Protein: 7.7 g/dL (ref 6.0–8.3)

## 2023-08-31 LAB — LIPID PANEL
Cholesterol: 170 mg/dL (ref 0–200)
HDL: 39.8 mg/dL (ref >39.00)
LDL Cholesterol: 88 mg/dL (ref 0–99)
NonHDL: 130.44
Total CHOL/HDL Ratio: 4
Triglycerides: 212 mg/dL — ABNORMAL HIGH (ref 0.0–149.0)
VLDL: 42.4 mg/dL — ABNORMAL HIGH (ref 0.0–40.0)

## 2023-08-31 LAB — LDL CHOLESTEROL, DIRECT: Direct LDL: 104 mg/dL

## 2023-09-02 DIAGNOSIS — R1310 Dysphagia, unspecified: Secondary | ICD-10-CM | POA: Insufficient documentation

## 2023-09-02 DIAGNOSIS — K7581 Nonalcoholic steatohepatitis (NASH): Secondary | ICD-10-CM | POA: Insufficient documentation

## 2023-09-02 NOTE — Assessment & Plan Note (Signed)
 Accomanied by T2DM and obesity.  Reviewed potential risks and benefits of GLP 1 agonists.  Deferring until GI evaluation is complete.

## 2023-09-02 NOTE — Assessment & Plan Note (Signed)
 DG esophagus recommended to assess for stricture, achalasia,  prior  to next weeks's GI appt

## 2023-09-03 ENCOUNTER — Ambulatory Visit: Payer: Self-pay | Admitting: Internal Medicine

## 2023-09-04 ENCOUNTER — Ambulatory Visit
Admission: RE | Admit: 2023-09-04 | Discharge: 2023-09-04 | Disposition: A | Discharge status: Home / Self Care | Source: Ambulatory Visit | Attending: Internal Medicine | Admitting: Internal Medicine

## 2023-09-04 DIAGNOSIS — K224 Dyskinesia of esophagus: Secondary | ICD-10-CM | POA: Diagnosis not present

## 2023-09-04 DIAGNOSIS — R1319 Other dysphagia: Secondary | ICD-10-CM | POA: Insufficient documentation

## 2023-09-04 DIAGNOSIS — K219 Gastro-esophageal reflux disease without esophagitis: Secondary | ICD-10-CM | POA: Diagnosis not present

## 2023-09-04 DIAGNOSIS — R1310 Dysphagia, unspecified: Secondary | ICD-10-CM | POA: Diagnosis not present

## 2023-09-04 NOTE — Progress Notes (Signed)
 Ellouise Console, PA-C [address] [place], [place]  [address] Phone: [phone]   Primary Care Physician: Marylynn Verneita CROME, MD  Primary Gastroenterologist:  Ellouise Console, PA-C / Glendia Holt, MD   Chief Complaint: Dysphagia       HPI:   Adam Becker is a 68 y.o. male is referred by his PCP to evaluate dysphagia.  Currently on pantoprazole  40 Mg daily for acid reflux.  Current symptoms:  He has had dysphagia for 5-6 months.  GERD is well controlled on PPI.  Feels like solid food is getting stuck in his lower esophagus.  He feels like food sits in the epigastrium.  No regurgitation.  No dysphagia to liquids.  Denies nausea, vomiting, abdominal pain, or weight loss.  09/04/2023 barium swallow tablet: Mild to moderate esophageal dysmotility.  No evidence of esophageal stricture, hiatal hernia or GERD.  Barium tablet passed with no delay.  PMH: HIV, GERD, moderate aortic stenosis, CHF, HTN, type 2 diabetes, MASH, obesity, CKD, permanent internal pacemaker for heart block.  11/2022 echo decreased LVEF 40 to 45%.  Patient has had multiple colonoscopies in 2008 (No Polyps), 2010 (No Polyps), 2015 (4mm colon mucosal polyp), 2022 (No polyps).  No previous EGD.  Patient's Father had Colon Cancer.  Patient has no personal history of precancerous polyps.  11/2020 last colonoscopy: No polyps. 10 year Repeat.  Current Outpatient Medications  Medication Sig Dispense Refill   Accu-Chek Softclix Lancets lancets Use twice daily to check blood sugars  E11.69 100 each 12   acetaminophen  (TYLENOL ) 500 MG tablet Take 1,000 mg by mouth every 6 (six) hours as needed for moderate pain or headache.     allopurinol  (ZYLOPRIM ) 100 MG tablet TAKE 1 TABLET BY MOUTH EVERY DAY 90 tablet 3   aspirin  EC 81 MG tablet Take 1 tablet (81 mg total) by mouth daily. 90 tablet 3   Bempedoic Acid  (NEXLETOL ) 180 MG TABS Take 1 tablet (180 mg total) by mouth daily. 90 tablet 3   Blood Glucose Monitoring Suppl  (ACCU-CHEK GUIDE ME) w/Device KIT 1 Device by Does not apply route 2 (two) times daily. 1 kit 0   carvedilol  (COREG ) 6.25 MG tablet Take 1 tablet (6.25 mg total) by mouth 2 (two) times daily. 180 tablet 3   cholecalciferol (VITAMIN D3) 25 MCG (1000 UNIT) tablet Take 1,000 Units by mouth daily.     darunavir  (PREZISTA ) 800 MG tablet Take 1 tablet (800 mg total) by mouth daily. 30 tablet 10   elvitegravir-cobicistat-emtricitabine-tenofovir (GENVOYA ) 150-150-200-10 MG TABS tablet TAKE 1 TABLET BY MOUTH EVERY DAY WITH BREAKFAST 30 tablet 11   empagliflozin  (JARDIANCE ) 10 MG TABS tablet TAKE 1 TABLET BY MOUTH EVERY DAY BEFORE BREAKFAST 90 tablet 1   ezetimibe  (ZETIA ) 10 MG tablet TAKE 1 TABLET BY MOUTH EVERY DAY 90 tablet 3   gabapentin  (NEURONTIN ) 300 MG capsule Take 2 capsules (600 mg total) by mouth 3 (three) times daily. 180 capsule 5   glucose blood test strip Use to check blood sugars twice daily. ICD-10 E11.69 100 each 12   glucose blood test strip Use twice daily to check blood sugars E11.69 100 each 12   Lancets (ONETOUCH ULTRASOFT) lancets Use to check blood sugars twice daily. ICD-10 E11.69 100 each 12   loratadine (CLARITIN) 10 MG tablet Take 10 mg by mouth daily.     metFORMIN  (GLUCOPHAGE -XR) 500 MG 24 hr tablet TAKE 1 TABLET BY MOUTH EVERY DAY WITH BREAKFAST 90 tablet 1  pantoprazole  (PROTONIX ) 40 MG tablet Take 1 tablet (40 mg total) by mouth daily. 30 tablet 3   rOPINIRole (REQUIP) 1 MG tablet Take 1.5 mg by mouth 4 (four) times daily.     sacubitril-valsartan (ENTRESTO ) 24-26 MG Take 1 tablet by mouth 2 (two) times daily. 180 tablet 3   spironolactone  (ALDACTONE ) 25 MG tablet Take 1 tablet (25 mg total) by mouth daily. 90 tablet 3   traZODone  (DESYREL ) 50 MG tablet TAKE 1/2 TO 1 TABLET BY MOUTH AT BEDTIME AS NEEDED FOR SLEEP 90 tablet 2   vitamin B-12 (CYANOCOBALAMIN ) 1000 MCG tablet Take 1,000 mcg by mouth daily.     No current facility-administered medications for this visit.     Allergies as of 09/05/2023 - Review Complete 09/05/2023  Allergen Reaction Noted   Pravastatin  Other (See Comments) 06/03/2020    Past Medical History:  Diagnosis Date   1st degree AV block    seen on ekg   Asplenia 11/11/2014   Chronic kidney disease 11/2011   admission for [facility] cr 11, acidosis   Diabetes mellitus without complication (HCC)    Type II   GERD (gastroesophageal reflux disease)    Gout    Heart murmur    HIV disease (HCC) 06/21/2022   HIV infection (HCC) 1994   Hyperglycemia 04/04/2016   Hyperlipidemia    Hypertension    Impaired fasting glucose 07/27/2015   LLL pneumonia 07/06/2021   Myopathy 06/21/2021   Neuromuscular disorder (HCC)    peripheral neuropathy   Neuropathy    TIA (transient ischemic attack) 08/18/2017   Umbilical hernia    Urethritis 02/11/2015    Past Surgical History:  Procedure Laterality Date   COLONOSCOPY     LIPOSUCTION HEAD / NECK  2001   posterior neck   MUSCLE BIOPSY Right 04/01/2021   Procedure: MUSCLE BIOPSY;  Surgeon: Dessa Reyes ORN, MD;  Location: [facility] ORS;  Service: General;  Laterality: Right;  gastrocnemius; O.R. to notify pathology of procedure   RIGHT/LEFT HEART CATH AND CORONARY ANGIOGRAPHY Bilateral 12/15/2022   Procedure: RIGHT/LEFT HEART CATH AND CORONARY ANGIOGRAPHY;  Surgeon: Mady Bruckner, MD;  Location: [facility];  Service: Cardiovascular;  Laterality: Bilateral;   SPLENECTOMY  2000   reason unclear stopped working    Review of Systems:    All systems reviewed and negative except where noted in HPI.    Physical Exam:  BP 116/72   Pulse 65   Ht 5' 11 (1.803 m)   Wt 232 lb (105.2 kg)   BMI 32.36 kg/m  No LMP for male patient.  General: Well-nourished, well-developed in no acute distress.  Lungs: Clear to auscultation bilaterally. Non-labored. Heart: Regular rate and rhythm, no murmurs rubs or gallops.  Abdomen: Bowel sounds are normal; Abdomen is Soft; No hepatosplenomegaly,  masses or hernias;  No Abdominal Tenderness; No guarding or rebound tenderness. Neuro: Alert and oriented x 3.  Grossly intact.  Psych: Alert and cooperative, normal mood and affect.   Imaging Studies: DG ESOPHAGUS W SINGLE CM (SOL OR THIN BA) Result Date: 09/04/2023 CLINICAL DATA:  68 year old male with a history of GERD, well controlled on medication, and dysphagia for the past 5-6 months. Admits to frequent episodes of solid foods getting stuck in his mid esophagus. Denies any episodes of regurgitation or symptoms with liquids. EXAM: ESOPHAGUS/BARIUM SWALLOW/TABLET STUDY TECHNIQUE: Combined double and single contrast examination was performed using effervescent crystals, high-density barium, and thin liquid barium. This exam was performed by Sherrilee Bal, PA-C,  and was supervised and interpreted by Manford Cummins, MD. FLUOROSCOPY: Radiation Exposure Index (as provided by the fluoroscopic device): 44.6 mGy Kerma COMPARISON:  Chest radiograph dated 10/20/2021 FINDINGS: Swallowing: Appears normal. No vestibular penetration or aspiration seen. Pharynx: Unremarkable. Esophagus: Normal appearance. No obvious mucosal abnormality or visible ulceration. Esophageal motility: Mild to moderate esophageal stasis with delayed passage of contrast from the upper esophagus into the distal esophagus and stomach. Hiatal Hernia: None visualized. Gastroesophageal reflux: None visualized with coughing or Valsalva. Ingested 13mm barium tablet: Passed without difficulty into the stomach. Other: None. IMPRESSION: Mild to moderate esophageal dysmotility. Electronically Signed   By: Limin  Xu M.D.   On: 09/04/2023 15:11    Labs: CBC    Component Value Date/Time   WBC 6.8 07/10/2023 1450   RBC 4.25 07/10/2023 1450   HGB 14.5 07/10/2023 1450   HGB 14.6 12/11/2022 0907   HCT 42.8 07/10/2023 1450   HCT 44.3 12/11/2022 0907   PLT 389.0 07/10/2023 1450   PLT 409 12/11/2022 0907   MCV 100.6 (H) 07/10/2023 1450   MCV 99 (H)  12/11/2022 0907   MCV 91 11/27/2011 0336   MCH 33.3 (H) 06/19/2023 0933   MCHC 34.0 07/10/2023 1450   RDW 13.6 07/10/2023 1450   RDW 12.4 12/11/2022 0907   RDW 13.0 11/27/2011 0336   LYMPHSABS 3.2 07/10/2023 1450   LYMPHSABS 2.9 11/27/2011 0336   MONOABS 0.8 07/10/2023 1450   MONOABS 2.2 (H) 11/27/2011 0336   EOSABS 0.4 07/10/2023 1450   EOSABS 0.4 11/27/2011 0336   BASOSABS 0.0 07/10/2023 1450   BASOSABS 0.1 11/27/2011 0336    CMP     Component Value Date/Time   NA 141 08/30/2023 1054   NA 140 01/09/2023 0942   NA 141 11/29/2011 0431   K 4.2 08/30/2023 1054   K 3.3 (L) 11/29/2011 0431   CL 103 08/30/2023 1054   CL 113 (H) 11/29/2011 0431   CO2 26 08/30/2023 1054   CO2 26 11/29/2011 0431   GLUCOSE 171 (H) 08/30/2023 1054   GLUCOSE 106 (H) 11/29/2011 0431   BUN 16 08/30/2023 1054   BUN 18 01/09/2023 0942   BUN 94 (H) 11/29/2011 0431   CREATININE 1.33 08/30/2023 1054   CREATININE 1.31 06/19/2023 0933   CALCIUM  9.7 08/30/2023 1054   CALCIUM  8.7 11/29/2011 0431   PROT 7.7 08/30/2023 1054   PROT 8.4 (H) 11/27/2011 0336   ALBUMIN 4.5 08/30/2023 1054   ALBUMIN 3.3 (L) 11/28/2011 0440   AST 19 08/30/2023 1054   AST 29 11/27/2011 0336   ALT 17 08/30/2023 1054   ALT 25 11/27/2011 0336   ALKPHOS 41 08/30/2023 1054   ALKPHOS 70 11/27/2011 0336   BILITOT 0.4 08/30/2023 1054   BILITOT 0.4 11/27/2011 0336   GFRNONAA >60 06/02/2022 0752   GFRNONAA 58 (L) 06/10/2020 1058   GFRAA 67 06/10/2020 1058       Assessment and Plan:   Adam Becker is a 68 y.o. y/o male presents for evaluation of:  1.  Dysphagia: Barium swallow test yesterday, showed esophageal dysmotility.  No stricture. - Scheduling EGD in [facility] I discussed risks of EGD with patient to include risk of bleeding, perforation, and risk of sedation.  Patient expressed understanding and agrees to proceed with EGD.   2.  GERD -controlled on pantoprazole  40 Mg daily. - Continue pantoprazole  40 Mg once daily. -  Recommend Lifestyle Modifications to prevent Acid Reflux.  Rec. Avoid coffee, sodas, peppermint, garlic, onions, alcohol, citrus  fruits, chocolate, tomatoes, fatty and spicey foods.  Avoid eating 2-3 hours before bedtime.    3.  Esophageal dysmotility - I gave patient education regarding esophageal dysmotility.  Lengthy discussion with patient regarding education and treatment.  Recommend eat small frequent meals.  Eat sitting up.  Try Gaviscon 30 mL after each meal.    Ellouise Console, PA-C  Follow up will be based on the EGD results and GI symptoms.

## 2023-09-05 ENCOUNTER — Ambulatory Visit (INDEPENDENT_AMBULATORY_CARE_PROVIDER_SITE_OTHER): Admitting: Physician Assistant

## 2023-09-05 ENCOUNTER — Encounter: Payer: Self-pay | Admitting: Physician Assistant

## 2023-09-05 VITALS — BP 116/72 | HR 65 | Ht 71.0 in | Wt 232.0 lb

## 2023-09-05 DIAGNOSIS — R1310 Dysphagia, unspecified: Secondary | ICD-10-CM

## 2023-09-05 DIAGNOSIS — K224 Dyskinesia of esophagus: Secondary | ICD-10-CM

## 2023-09-05 DIAGNOSIS — K219 Gastro-esophageal reflux disease without esophagitis: Secondary | ICD-10-CM

## 2023-09-05 NOTE — Patient Instructions (Addendum)
 For Esophageal Dysmotility: -  Recommend eat small frequent meals.  Eat sitting up.  Try OTC Gaviscon 30 mL after each meal.   You have been scheduled for an Endoscopy. Please follow written instructions given to you at your visit today.  If you use inhalers (even only as needed), please bring them with you on the day of your procedure.  If you take any of the following medications, they will need to be adjusted prior to your procedure:   DO NOT TAKE 7 DAYS PRIOR TO TEST- Trulicity (dulaglutide) Ozempic, Wegovy (semaglutide) Mounjaro (tirzepatide) Bydureon Bcise (exanatide extended release)  DO NOT TAKE 1 DAY PRIOR TO YOUR TEST Rybelsus (semaglutide) Adlyxin (lixisenatide) Victoza (liraglutide) Byetta (exanatide) ___________________________________________________________________________  Please follow up sooner if symptoms increase or worsen  Due to recent changes in healthcare laws, you may see the results of your imaging and laboratory studies on MyChart before your provider has had a chance to review them.  We understand that in some cases there may be results that are confusing or concerning to you. Not all laboratory results come back in the same time frame and the provider may be waiting for multiple results in order to interpret others.  Please give us  48 hours in order for your provider to thoroughly review all the results before contacting the office for clarification of your results.   Thank you for trusting me with your gastrointestinal care!   Ellouise Console, PA-C _______________________________________________________  If your blood pressure at your visit was 140/90 or greater, please contact your primary care physician to follow up on this.  _______________________________________________________  If you are age 38 or older, your body mass index should be between 23-30. Your Body mass index is 32.36 kg/m. If this is out of the aforementioned range listed, please  consider follow up with your Primary Care Provider.  If you are age 36 or younger, your body mass index should be between 19-25. Your Body mass index is 32.36 kg/m. If this is out of the aformentioned range listed, please consider follow up with your Primary Care Provider.   ________________________________________________________  The [facility] providers would like to encourage you to use MYCHART to communicate with providers for non-urgent requests or questions.  Due to long hold times on the telephone, sending your provider a message by [facility] may be a faster and more efficient way to get a response.  Please allow 48 business hours for a response.  Please remember that this is for non-urgent requests.  _______________________________________________________

## 2023-09-13 NOTE — Progress Notes (Signed)
 Agree with the assessment and plan as outlined by Brigitte Canard, PA-C.

## 2023-09-17 ENCOUNTER — Encounter (HOSPITAL_BASED_OUTPATIENT_CLINIC_OR_DEPARTMENT_OTHER): Payer: Self-pay

## 2023-09-17 DIAGNOSIS — H524 Presbyopia: Secondary | ICD-10-CM | POA: Diagnosis not present

## 2023-09-18 ENCOUNTER — Ambulatory Visit

## 2023-09-19 DIAGNOSIS — N1831 Chronic kidney disease, stage 3a: Secondary | ICD-10-CM | POA: Diagnosis not present

## 2023-09-19 DIAGNOSIS — Z008 Encounter for other general examination: Secondary | ICD-10-CM | POA: Diagnosis not present

## 2023-09-19 DIAGNOSIS — E669 Obesity, unspecified: Secondary | ICD-10-CM | POA: Diagnosis not present

## 2023-09-19 DIAGNOSIS — Z6831 Body mass index (BMI) 31.0-31.9, adult: Secondary | ICD-10-CM | POA: Diagnosis not present

## 2023-09-19 DIAGNOSIS — Z950 Presence of cardiac pacemaker: Secondary | ICD-10-CM | POA: Diagnosis not present

## 2023-09-26 ENCOUNTER — Encounter: Payer: Self-pay | Admitting: Gastroenterology

## 2023-09-26 DIAGNOSIS — I5022 Chronic systolic (congestive) heart failure: Secondary | ICD-10-CM | POA: Diagnosis not present

## 2023-09-26 DIAGNOSIS — I10 Essential (primary) hypertension: Secondary | ICD-10-CM | POA: Diagnosis not present

## 2023-09-26 DIAGNOSIS — I442 Atrioventricular block, complete: Secondary | ICD-10-CM | POA: Diagnosis not present

## 2023-09-26 DIAGNOSIS — Z45018 Encounter for adjustment and management of other part of cardiac pacemaker: Secondary | ICD-10-CM | POA: Diagnosis not present

## 2023-09-26 DIAGNOSIS — Z8679 Personal history of other diseases of the circulatory system: Secondary | ICD-10-CM | POA: Diagnosis not present

## 2023-09-26 DIAGNOSIS — E785 Hyperlipidemia, unspecified: Secondary | ICD-10-CM | POA: Diagnosis not present

## 2023-09-26 DIAGNOSIS — Z950 Presence of cardiac pacemaker: Secondary | ICD-10-CM | POA: Diagnosis not present

## 2023-09-26 DIAGNOSIS — I350 Nonrheumatic aortic (valve) stenosis: Secondary | ICD-10-CM | POA: Diagnosis not present

## 2023-09-26 DIAGNOSIS — G4733 Obstructive sleep apnea (adult) (pediatric): Secondary | ICD-10-CM | POA: Diagnosis not present

## 2023-09-26 DIAGNOSIS — E1169 Type 2 diabetes mellitus with other specified complication: Secondary | ICD-10-CM | POA: Diagnosis not present

## 2023-09-26 DIAGNOSIS — I129 Hypertensive chronic kidney disease with stage 1 through stage 4 chronic kidney disease, or unspecified chronic kidney disease: Secondary | ICD-10-CM | POA: Diagnosis not present

## 2023-09-27 ENCOUNTER — Encounter: Payer: Self-pay | Admitting: Cardiology

## 2023-09-27 ENCOUNTER — Ambulatory Visit: Attending: Cardiology | Admitting: Cardiology

## 2023-09-27 VITALS — BP 100/60 | HR 69 | Ht 71.0 in | Wt 229.5 lb

## 2023-09-27 DIAGNOSIS — I10 Essential (primary) hypertension: Secondary | ICD-10-CM

## 2023-09-27 DIAGNOSIS — I442 Atrioventricular block, complete: Secondary | ICD-10-CM

## 2023-09-27 DIAGNOSIS — E782 Mixed hyperlipidemia: Secondary | ICD-10-CM

## 2023-09-27 DIAGNOSIS — I428 Other cardiomyopathies: Secondary | ICD-10-CM

## 2023-09-27 DIAGNOSIS — I350 Nonrheumatic aortic (valve) stenosis: Secondary | ICD-10-CM

## 2023-09-27 NOTE — Progress Notes (Signed)
 Cardiology Office Note:    Date:  09/27/2023   ID:  [name], DOB Jun 19, 1955, MRN [id number]  PCP:  Marylynn Verneita CROME, MD   [facility] Providers Cardiologist:  Redell Cave, MD     Referring MD: Marylynn Verneita CROME, MD   Chief Complaint  Patient presents with   6 month follow up     Doing well.     History of Present Illness:    Adam Becker is a 68 y.o. 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  Doing okay, denies chest pain or shortness of breath.  Compliant with medications as prescribed.  Pacemaker was placed at [facility], device is being monitored by [facility].  He denies chest pain or shortness of breath, denies edema, compliant with medications as prescribed.   Prior notes/testing Echo 11/2022 EF 40 to 45%, mild aortic valve stenosis. Left heart cath 11/2022 15 to 20% etiology stenosis. History of rhabdomyolysis with statins.  Past Medical History:  Diagnosis Date   1st degree AV block    seen on ekg   Asplenia 11/11/2014   Chronic kidney disease 11/2011   admission for [facility] cr 11, acidosis   Diabetes mellitus without complication (HCC)    Type II   GERD (gastroesophageal reflux disease)    Gout    Heart murmur    HIV disease (HCC) 06/21/2022   HIV infection (HCC) 1994   Hyperglycemia 04/04/2016   Hyperlipidemia    Hypertension    Impaired fasting glucose 07/27/2015   LLL pneumonia 07/06/2021   Myopathy 06/21/2021   Neuromuscular disorder (HCC)    peripheral neuropathy   Neuropathy    TIA (transient ischemic attack) 08/18/2017   Umbilical hernia    Urethritis 02/11/2015    Past Surgical History:  Procedure Laterality Date   COLONOSCOPY     LIPOSUCTION HEAD / NECK  2001   posterior neck   MUSCLE BIOPSY Right 04/01/2021   Procedure: MUSCLE BIOPSY;  Surgeon: Dessa Reyes ORN, MD;  Location: [facility] ORS;  Service: General;   Laterality: Right;  gastrocnemius; O.R. to notify pathology of procedure   RIGHT/LEFT HEART CATH AND CORONARY ANGIOGRAPHY Bilateral 12/15/2022   Procedure: RIGHT/LEFT HEART CATH AND CORONARY ANGIOGRAPHY;  Surgeon: Mady Bruckner, MD;  Location: [facility] INVASIVE CV LAB;  Service: Cardiovascular;  Laterality: Bilateral;   SPLENECTOMY  2000   reason unclear stopped working    Current Medications: Current Meds  Medication Sig   Accu-Chek Softclix Lancets lancets Use twice daily to check blood sugars  E11.69   acetaminophen  (TYLENOL ) 500 MG tablet Take 1,000 mg by mouth every 6 (six) hours as needed for moderate pain or headache.   allopurinol  (ZYLOPRIM ) 100 MG tablet TAKE 1 TABLET BY MOUTH EVERY DAY   aspirin  EC 81 MG tablet Take 1 tablet (81 mg total) by mouth daily.   Bempedoic Acid  (NEXLETOL ) 180 MG TABS Take 1 tablet (180 mg total) by mouth daily.   Blood Glucose Monitoring Suppl (ACCU-CHEK GUIDE ME) w/Device KIT 1 Device by Does not apply route 2 (two) times daily.   carvedilol  (COREG ) 6.25 MG tablet Take 1 tablet (6.25 mg total) by mouth 2 (two) times daily.   cholecalciferol (VITAMIN D3) 25 MCG (1000 UNIT) tablet Take 1,000 Units by mouth daily.   darunavir  (PREZISTA ) 800 MG tablet Take 1 tablet (800 mg total) by mouth daily.   elvitegravir-cobicistat-emtricitabine-tenofovir (GENVOYA ) 150-150-200-10 MG TABS  tablet TAKE 1 TABLET BY MOUTH EVERY DAY WITH BREAKFAST   empagliflozin  (JARDIANCE ) 10 MG TABS tablet TAKE 1 TABLET BY MOUTH EVERY DAY BEFORE BREAKFAST   ezetimibe  (ZETIA ) 10 MG tablet TAKE 1 TABLET BY MOUTH EVERY DAY   gabapentin  (NEURONTIN ) 300 MG capsule Take 2 capsules (600 mg total) by mouth 3 (three) times daily.   glucose blood test strip Use to check blood sugars twice daily. ICD-10 E11.69   glucose blood test strip Use twice daily to check blood sugars E11.69   Lancets (ONETOUCH ULTRASOFT) lancets Use to check blood sugars twice daily. ICD-10 E11.69   loratadine (CLARITIN) 10  MG tablet Take 10 mg by mouth daily.   metFORMIN  (GLUCOPHAGE -XR) 500 MG 24 hr tablet TAKE 1 TABLET BY MOUTH EVERY DAY WITH BREAKFAST   pantoprazole  (PROTONIX ) 40 MG tablet Take 1 tablet (40 mg total) by mouth daily.   rOPINIRole (REQUIP) 1 MG tablet Take 1.5 mg by mouth 4 (four) times daily.   sacubitril-valsartan (ENTRESTO ) 24-26 MG Take 1 tablet by mouth 2 (two) times daily.   spironolactone  (ALDACTONE ) 25 MG tablet Take 1 tablet (25 mg total) by mouth daily.   traZODone  (DESYREL ) 50 MG tablet TAKE 1/2 TO 1 TABLET BY MOUTH AT BEDTIME AS NEEDED FOR SLEEP   vitamin B-12 (CYANOCOBALAMIN ) 1000 MCG tablet Take 1,000 mcg by mouth daily.     Allergies:   Pravastatin    Social History   Socioeconomic History   Marital status: Single    Spouse name: Not on file   Number of children: 0   Years of education: Not on file   Highest education level: Not on file  Occupational History   Occupation: retired non-profit  Tobacco Use   Smoking status: Never   Smokeless tobacco: Never  Vaping Use   Vaping status: Never Used  Substance and Sexual Activity   Alcohol use: No   Drug use: No   Sexual activity: Not Currently  Other Topics Concern   Not on file  Social History Narrative   Independent baseline. Lives by himself.  Education 12th.  Children none.  Caffeine tea one cup 4 x week.   Social Drivers of Corporate investment banker Strain: Low Risk  (08/31/2022)   Overall Financial Resource Strain (CARDIA)    Difficulty of Paying Living Expenses: Not hard at all  Food Insecurity: No Food Insecurity (08/31/2022)   Hunger Vital Sign    Worried About Running Out of Food in the Last Year: Never true    Ran Out of Food in the Last Year: Never true  Transportation Needs: No Transportation Needs (08/31/2022)   PRAPARE - Administrator, Civil Service (Medical): No    Lack of Transportation (Non-Medical): No  Physical Activity: Sufficiently Active (08/31/2022)   Exercise Vital Sign    Days  of Exercise per Week: 7 days    Minutes of Exercise per Session: 30 min  Stress: No Stress Concern Present (08/31/2022)   [employer] of Occupational Health - Occupational Stress Questionnaire    Feeling of Stress : Not at all  Social Connections: Moderately Integrated (08/31/2022)   Social Connection and Isolation Panel    Frequency of Communication with Friends and Family: More than three times a week    Frequency of Social Gatherings with Friends and Family: Twice a week    Attends Religious Services: More than 4 times per year    Active Member of Clubs or Organizations: Yes    Attends  Engineer, structural: More than 4 times per year    Marital Status: Never married     Family History: The patient's family history includes Birth defects in his son; Breast cancer in his mother; Colon cancer in his father; Colon polyps in his father. There is no history of Rectal cancer or Stomach cancer.  ROS:   Please see the history of present illness.     All other systems reviewed and are negative.  EKGs/Labs/Other Studies Reviewed:    The following studies were reviewed today:  EKG Interpretation Date/Time:  Thursday September 27 2023 08:51:28 EDT Ventricular Rate:  69 PR Interval:  194 QRS Duration:  156 QT Interval:  440 QTC Calculation: 471 R Axis:   -60  Text Interpretation: Atrial-sensed ventricular-paced rhythm Confirmed by Darliss Rogue ([id number]) on 09/27/2023 9:13:11 AM    Recent Labs: 07/10/2023: Hemoglobin 14.5; Platelets 389.0 08/30/2023: ALT 17; BUN 16; Creatinine, Ser 1.33; Potassium 4.2; Sodium 141; TSH 0.69  Recent Lipid Panel    Component Value Date/Time   CHOL 170 08/30/2023 1054   TRIG 212.0 (H) 08/30/2023 1054   HDL 39.80 08/30/2023 1054   CHOLHDL 4 08/30/2023 1054   VLDL 42.4 (H) 08/30/2023 1054   LDLCALC 88 08/30/2023 1054   LDLCALC 101 (H) 06/19/2023 0933   LDLDIRECT 104.0 08/30/2023 1054     Risk Assessment/Calculations:             Physical Exam:    VS:  BP 100/60 (BP Location: Left Arm, Patient Position: Sitting, Cuff Size: Normal)   Pulse 69   Ht 5' 11 (1.803 m)   Wt 229 lb 8 oz (104.1 kg)   SpO2 94%   BMI 32.01 kg/m     Wt Readings from Last 3 Encounters:  09/27/23 229 lb 8 oz (104.1 kg)  09/05/23 232 lb (105.2 kg)  08/30/23 231 lb 6.4 oz (105 kg)     GEN:  Well nourished, well developed in no acute distress HEENT: Normal NECK: No JVD; No carotid bruits CARDIAC: RRR RESPIRATORY:  Clear to auscultation without rales, wheezing or rhonchi  ABDOMEN: Soft, non-tender, non-distended MUSCULOSKELETAL:  No edema; No deformity  SKIN: Warm and dry NEUROLOGIC:  Alert and oriented x 3 PSYCHIATRIC:  Normal affect   ASSESSMENT:    1. Nonischemic cardiomyopathy (HCC)   2. Aortic valve stenosis, etiology of cardiac valve disease unspecified   3. Primary hypertension   4. Mixed hyperlipidemia   5. CHB (complete heart block) (HCC)    PLAN:    In order of problems listed above:  Nonischemic cardiomyopathy, EF 40 to 45%.  Appears euvolemic.  Continue Coreg  6.25 mg twice daily, Entresto  24-26 mg twice daily, spironolactone  25 mg daily.  Low normal BP preventing uptitration of GDMT.  Repeat echo in 2 months, about 1 year from previous. Mild aortic valve stenosis on echo 11/2022.  Continue to monitor serially. Hypertension, BP controlled, continue Coreg , Entresto , spironolactone  see #1 above. Hyperlipidemia, continue Zetia , bempedoic acid .  History of rhabdomyolysis with statins.   Complete heart block s/p permanent pacemaker 11/23.  Device being monitored at [facility].  Follow-up in 5 months     Medication Adjustments/Labs and Tests Ordered: Current medicines are reviewed at length with the patient today.  Concerns regarding medicines are outlined above.  Orders Placed This Encounter  Procedures   EKG 12-Lead   ECHOCARDIOGRAM COMPLETE   No orders of the defined types were placed in this  encounter.   Patient Instructions  Medication Instructions:  Your physician recommends that you continue on your current medications as directed. Please refer to the Current Medication list given to you today.   *If you need a refill on your cardiac medications before your next appointment, please call your pharmacy*  Lab Work: No labs ordered today  If you have labs (blood work) drawn today and your tests are completely normal, you will receive your results only by: MyChart Message (if you have MyChart) OR A paper copy in the mail If you have any lab test that is abnormal or we need to change your treatment, we will call you to review the results.  Testing/Procedures: Your physician has requested that you have an echocardiogram in 2 months. Echocardiography is a painless test that uses sound waves to create images of your heart. It provides your doctor with information about the size and shape of your heart and how well your heart's chambers and valves are working.   You may receive an ultrasound enhancing agent through an IV if needed to better visualize your heart during the echo. This procedure takes approximately one hour.  There are no restrictions for this procedure.  This will take place at [address] [facility] [address], [place] [address]  Please note: We ask at that you not bring children with you during ultrasound (echo/ vascular) testing. Due to room size and safety concerns, children are not allowed in the ultrasound rooms during exams. Our front office staff cannot provide observation of children in our lobby area while testing is being conducted. An adult accompanying a patient to their appointment will only be allowed in the ultrasound room at the discretion of the ultrasound technician under special circumstances. We apologize for any inconvenience.   Follow-Up: At [facility], you and your health needs are our priority.  As part of our  continuing mission to provide you with exceptional heart care, our providers are all part of one team.  This team includes your primary Cardiologist (physician) and Advanced Practice Providers or APPs (Physician Assistants and Nurse Practitioners) who all work together to provide you with the care you need, when you need it.  Your next appointment:   5 month(s)  Provider:   You may see Redell Cave, MD or one of the following Advanced Practice Providers on your designated Care Team:   Lonni Meager, NP Lesley Maffucci, PA-C Bernardino Bring, PA-C Cadence Stockton, PA-C Tylene Lunch, NP Barnie Hila, NP    We recommend signing up for the patient portal called MyChart.  Sign up information is provided on this After Visit Summary.  MyChart is used to connect with patients for Virtual Visits (Telemedicine).  Patients are able to view lab/test results, encounter notes, upcoming appointments, etc.  Non-urgent messages can be sent to your provider as well.   To learn more about what you can do with MyChart, go to [email or web address].         Signed, Redell Cave, MD  09/27/2023 10:08 AM    [facility]

## 2023-09-27 NOTE — Patient Instructions (Signed)
 Medication Instructions:  Your physician recommends that you continue on your current medications as directed. Please refer to the Current Medication list given to you today.   *If you need a refill on your cardiac medications before your next appointment, please call your pharmacy*  Lab Work: No labs ordered today  If you have labs (blood work) drawn today and your tests are completely normal, you will receive your results only by: MyChart Message (if you have MyChart) OR A paper copy in the mail If you have any lab test that is abnormal or we need to change your treatment, we will call you to review the results.  Testing/Procedures: Your physician has requested that you have an echocardiogram in 2 months. Echocardiography is a painless test that uses sound waves to create images of your heart. It provides your doctor with information about the size and shape of your heart and how well your heart's chambers and valves are working.   You may receive an ultrasound enhancing agent through an IV if needed to better visualize your heart during the echo. This procedure takes approximately one hour.  There are no restrictions for this procedure.  This will take place at [address] [facility] [address]) [address], [place] [address]  Please note: We ask at that you not bring children with you during ultrasound (echo/ vascular) testing. Due to room size and safety concerns, children are not allowed in the ultrasound rooms during exams. Our front office staff cannot provide observation of children in our lobby area while testing is being conducted. An adult accompanying a patient to their appointment will only be allowed in the ultrasound room at the discretion of the ultrasound technician under special circumstances. We apologize for any inconvenience.   Follow-Up: At [facility], you and your health needs are our priority.  As part of our continuing mission to provide you with  exceptional heart care, our providers are all part of one team.  This team includes your primary Cardiologist (physician) and Advanced Practice Providers or APPs (Physician Assistants and Nurse Practitioners) who all work together to provide you with the care you need, when you need it.  Your next appointment:   5 month(s)  Provider:   You may see Redell Cave, MD or one of the following Advanced Practice Providers on your designated Care Team:   Lonni Meager, NP Lesley Maffucci, PA-C Bernardino Bring, PA-C Cadence Rotonda, PA-C Tylene Lunch, NP Barnie Hila, NP    We recommend signing up for the patient portal called MyChart.  Sign up information is provided on this After Visit Summary.  MyChart is used to connect with patients for Virtual Visits (Telemedicine).  Patients are able to view lab/test results, encounter notes, upcoming appointments, etc.  Non-urgent messages can be sent to your provider as well.   To learn more about what you can do with MyChart, go to [email or web address].

## 2023-10-03 ENCOUNTER — Other Ambulatory Visit: Payer: Self-pay | Admitting: Internal Medicine

## 2023-10-03 ENCOUNTER — Other Ambulatory Visit: Payer: Self-pay | Admitting: Cardiology

## 2023-10-03 ENCOUNTER — Ambulatory Visit: Admitting: Gastroenterology

## 2023-10-03 ENCOUNTER — Encounter: Payer: Self-pay | Admitting: Gastroenterology

## 2023-10-03 VITALS — BP 112/70 | HR 71 | Temp 97.6°F | Resp 16 | Ht 71.0 in | Wt 229.0 lb

## 2023-10-03 DIAGNOSIS — K229 Disease of esophagus, unspecified: Secondary | ICD-10-CM | POA: Diagnosis not present

## 2023-10-03 DIAGNOSIS — R1310 Dysphagia, unspecified: Secondary | ICD-10-CM | POA: Diagnosis not present

## 2023-10-03 DIAGNOSIS — K319 Disease of stomach and duodenum, unspecified: Secondary | ICD-10-CM | POA: Diagnosis not present

## 2023-10-03 DIAGNOSIS — I10 Essential (primary) hypertension: Secondary | ICD-10-CM | POA: Diagnosis not present

## 2023-10-03 DIAGNOSIS — G4733 Obstructive sleep apnea (adult) (pediatric): Secondary | ICD-10-CM | POA: Diagnosis not present

## 2023-10-03 DIAGNOSIS — K3189 Other diseases of stomach and duodenum: Secondary | ICD-10-CM | POA: Diagnosis not present

## 2023-10-03 DIAGNOSIS — E119 Type 2 diabetes mellitus without complications: Secondary | ICD-10-CM | POA: Diagnosis not present

## 2023-10-03 MED ORDER — SODIUM CHLORIDE 0.9 % IV SOLN: 500.0000 mL | Freq: Once | INTRAVENOUS | Status: AC

## 2023-10-03 NOTE — Op Note (Signed)
  [facility] Patient Name: Adam Becker Procedure Date: 10/03/2023 9:29 AM MRN: [id number] Endoscopist: Glendia E. Stacia , MD, [id number] Age: 68 Referring MD:  Date of Birth: 08-14-1955 Gender: Male Account #: [id number] Procedure:                Upper GI endoscopy Indications:              Dysphagia Medicines:                Monitored Anesthesia Care Procedure:                Pre-Anesthesia Assessment:                           - Prior to the procedure, a History and Physical                            was performed, and patient medications and                            allergies were reviewed. The patient's tolerance of                            previous anesthesia was also reviewed. The risks                            and benefits of the procedure and the sedation                            options and risks were discussed with the patient.                            All questions were answered, and informed consent                            was obtained. Prior Anticoagulants: The patient has                            taken no anticoagulant or antiplatelet agents. ASA                            Grade Assessment: III - A patient with severe                            systemic disease. After reviewing the risks and                            benefits, the patient was deemed in satisfactory                            condition to undergo the procedure.                           After obtaining informed consent, the endoscope was  passed under direct vision. Throughout the                            procedure, the patient's blood pressure, pulse, and                            oxygen saturations were monitored continuously. The                            Olympus scope [id number] was introduced through the                            mouth, and advanced to the antrum of the stomach.                            The upper GI endoscopy was  accomplished without                            difficulty. The patient tolerated the procedure                            well. Scope In: Scope Out: Findings:                 The examined portions of the nasopharynx,                            oropharynx and larynx were normal.                           The examined esophagus was normal. Biopsies were                            obtained from the proximal and distal esophagus                            with cold forceps for histology of suspected                            eosinophilic esophagitis. Estimated blood loss was                            minimal. A guidewire was placed and the scope was                            withdrawn. Dilation was performed with a Savary                            dilator with no resistance at 18 mm. The dilation                            site was examined following endoscope reinsertion                            and showed  no change. Estimated blood loss: none.                           A single 6 mm submucosal papule (nodule) was found                            in the gastric body. The nodule was mobile when                            probed with the forceps. Bite on bite biopsies were                            taken with a cold forceps for histology. Estimated                            blood loss was minimal.                           The exam of the stomach was otherwise normal. Complications:            No immediate complications. Estimated Blood Loss:     Estimated blood loss was minimal. Impression:               - The examined portions of the nasopharynx,                            oropharynx and larynx were normal.                           - Normal esophagus. Dilated.                           - A single submucosal nodule found in the stomach.                            Biopsied.                           - Biopsies were taken with a cold forceps for                            evaluation  of eosinophilic esophagitis.                           - No endoscopic abnormalities to explain the                            patient's dysphagia. Suspect esophageal dysmotility                            based on barium swallow findings. Recommendation:           - Patient has a contact number available for                            emergencies. The signs  and symptoms of potential                            delayed complications were discussed with the                            patient. Return to normal activities tomorrow.                            Written discharge instructions were provided to the                            patient.                           - Resume previous diet.                           - Continue present medications.                           - Await pathology results. Consider CT vs EUS to                            further evaluate small gastric nodule Dejanira Pamintuan E. Stacia, MD 10/03/2023 10:08:33 AM This report has been signed electronically.

## 2023-10-03 NOTE — Patient Instructions (Signed)
 YOU HAD AN ENDOSCOPIC PROCEDURE TODAY AT THE [facility]:   Refer to the procedure report that was given to you for any specific questions about what was found during the examination.  If the procedure report does not answer your questions, please call your gastroenterologist to clarify.  If you requested that your care partner not be given the details of your procedure findings, then the procedure report has been included in a sealed envelope for you to review at your convenience later.  YOU SHOULD EXPECT: Some feelings of bloating in the abdomen. Passage of more gas than usual.  Walking can help get rid of the air that was put into your GI tract during the procedure and reduce the bloating. If you had a lower endoscopy (such as a colonoscopy or flexible sigmoidoscopy) you may notice spotting of blood in your stool or on the toilet paper. If you underwent a bowel prep for your procedure, you may not have a normal bowel movement for a few days.  Please Note:  You might notice some irritation and congestion in your nose or some drainage.  This is from the oxygen used during your procedure.  There is no need for concern and it should clear up in a day or so.  SYMPTOMS TO REPORT IMMEDIATELY:   Following upper endoscopy (EGD)  Vomiting of blood or coffee ground material  New chest pain or pain under the shoulder blades  Painful or persistently difficult swallowing  New shortness of breath  Fever of 100F or higher  Black, tarry-looking stools  For urgent or emergent issues, a gastroenterologist can be reached at any hour by calling [phone]. Do not use MyChart messaging for urgent concerns.    DIET:  We do recommend a small meal at first, but then you may proceed to your regular diet.  Drink plenty of fluids but you should avoid alcoholic beverages for 24 hours.  MEDICATIONS: Continue present medications.  FOLLOW UP: Await pathology results. Consider CT versus EUS to further  evaluate small gastric nodules.  Thank you for allowing us  to provide for your healthcare needs today.  ACTIVITY:  You should plan to take it easy for the rest of today and you should NOT DRIVE or use heavy machinery until tomorrow (because of the sedation medicines used during the test).    FOLLOW UP: Our staff will call the number listed on your records the next business day following your procedure.  We will call around 7:15- 8:00 am to check on you and address any questions or concerns that you may have regarding the information given to you following your procedure. If we do not reach you, we will leave a message.     If any biopsies were taken you will be contacted by phone or by letter within the next 1-3 weeks.  Please call us  at [phone] if you have not heard about the biopsies in 3 weeks.    SIGNATURES/CONFIDENTIALITY: You and/or your care partner have signed paperwork which will be entered into your electronic medical record.  These signatures attest to the fact that that the information above on your After Visit Summary has been reviewed and is understood.  Full responsibility of the confidentiality of this discharge information lies with you and/or your care-partner.

## 2023-10-03 NOTE — Progress Notes (Signed)
 Called to room to assist during endoscopic procedure.  Patient ID and intended procedure confirmed with present staff. Received instructions for my participation in the procedure from the performing physician.

## 2023-10-03 NOTE — Progress Notes (Signed)
 History and Physical Interval Note:  10/03/2023 9:34 AM  Adam Becker  has presented today for endoscopic procedure(s), with the diagnosis of  Encounter Diagnosis  Name Primary?   Dysphagia, unspecified type Yes  .  The various methods of evaluation and treatment have been discussed with the patient and/or family. After consideration of risks, benefits and other options for treatment, the patient has consented to  the endoscopic procedure(s).   The patient's history has been reviewed, patient examined, no change in status, stable for endoscopic procedure(s).  I have reviewed the patient's chart and labs.  Questions were answered to the patient's satisfaction.     Shaylyn Bawa E. Stacia, MD [facility]

## 2023-10-04 ENCOUNTER — Other Ambulatory Visit: Payer: Self-pay | Admitting: Cardiology

## 2023-10-04 ENCOUNTER — Telehealth: Payer: Self-pay | Admitting: *Deleted

## 2023-10-04 NOTE — Telephone Encounter (Signed)
  Follow up Call-     10/03/2023    8:56 AM  Call back number  Post procedure Call Back phone  # [phone]  Permission to leave phone message Yes     Patient questions:  Do you have a fever, pain , or abdominal swelling? No. Pain Score  0 *  Have you tolerated food without any problems? Yes.    Have you been able to return to your normal activities? Yes.    Do you have any questions about your discharge instructions: Diet   No. Medications  No. Follow up visit  No.  Do you have questions or concerns about your Care? No.  Actions: * If pain score is 4 or above: No action needed, pain <4.

## 2023-10-05 ENCOUNTER — Telehealth: Payer: Self-pay

## 2023-10-05 NOTE — Telephone Encounter (Signed)
 Copied from CRM #[id number]. Topic: Medical Record Request - Records Request >> Oct 05, 2023  2:46 PM Harlene ORN wrote: Reason for CRM: [facility]  Faxed over medical records request on 10/02/2023. Is calling to follow up on request.  [facility] Phone: [phone] [facility]: [phone]

## 2023-10-05 NOTE — Telephone Encounter (Signed)
Family history has been updated

## 2023-10-05 NOTE — Telephone Encounter (Signed)
 Copied from CRM #[id number]. Topic: Clinical - Medical Advice >> Oct 05, 2023 12:18 PM Tysheama G wrote: Reason for CRM: Patient wanted Dr.[name] to know his father passed away from pancreatic cancer not colon cancer.

## 2023-10-05 NOTE — Telephone Encounter (Signed)
 I have not received. Have any of you seen it come through or sent it to medical records already?

## 2023-10-05 NOTE — Telephone Encounter (Signed)
 FYI

## 2023-10-08 LAB — SURGICAL PATHOLOGY

## 2023-10-10 ENCOUNTER — Other Ambulatory Visit: Payer: Self-pay

## 2023-10-10 ENCOUNTER — Ambulatory Visit: Payer: Self-pay | Admitting: Gastroenterology

## 2023-10-10 DIAGNOSIS — K3189 Other diseases of stomach and duodenum: Secondary | ICD-10-CM

## 2023-10-10 NOTE — Progress Notes (Signed)
 Adam Becker,  The biopsies of your esophagus showed mild reactive changes, likely from acid reflux, but no evidence of eosinophilic esophagitis to explain your swallowing difficulties.  Your symptoms may be due to poor peristalsis (contraction and relaxation) of the esophagus, as suggested on your barium swallow.  The biopsies of the small nodule in the stomach showed benign mucosa and were nondiagnostic.  Although I am not very concerned about this small nodule, I do think obtaining a CT of the abdomen/pelvis to make sure there are no masses or enlarged lymph nodes is reasonable.    Team,  Please order CT abd/pelvis with IV/PO contrast to evaluate gastric nodule found on EGD

## 2023-10-14 ENCOUNTER — Other Ambulatory Visit: Payer: Self-pay | Admitting: Infectious Disease

## 2023-10-14 ENCOUNTER — Other Ambulatory Visit: Payer: Self-pay

## 2023-10-14 DIAGNOSIS — B20 Human immunodeficiency virus [HIV] disease: Secondary | ICD-10-CM

## 2023-10-15 ENCOUNTER — Ambulatory Visit
Admission: RE | Admit: 2023-10-15 | Discharge: 2023-10-15 | Disposition: A | Discharge status: Home / Self Care | Source: Ambulatory Visit | Attending: Gastroenterology | Admitting: Gastroenterology

## 2023-10-15 DIAGNOSIS — K3189 Other diseases of stomach and duodenum: Secondary | ICD-10-CM | POA: Insufficient documentation

## 2023-10-15 MED ORDER — IOHEXOL 300 MG/ML  SOLN
75.0000 mL | Freq: Once | INTRAMUSCULAR | Status: AC | PRN
Start: 2023-10-15 — End: 2023-10-15
  Administered 2023-10-15: 75 mL via INTRAVENOUS

## 2023-10-15 NOTE — Telephone Encounter (Signed)
 10 refills were sent in July 2025

## 2023-10-23 ENCOUNTER — Ambulatory Visit: Payer: Self-pay | Admitting: Gastroenterology

## 2023-10-23 NOTE — Progress Notes (Signed)
 Adam Becker, Your CT did not show any concerning findings such as lymphadenopathy or masses near the stomach.  Based on the very benign appearance of the mobile nodule seen during your upper endoscopy, I would not recommend any further follow-up.  Incidentally noted were a fat-containing hernia without any bowel in the center of your abdomen.  In the absence of any significant symptoms, I would recommend against surgery, but I can refer you to a surgeon if you would like to discuss.   Fatty change of the liver was noted, which we had previously discussed on your ultrasound.    Aortic atherosclerosis was also noted.  This is hardening of the blood vessels typically related to high blood pressure and high cholesterol.  Follow-up with your PCP regarding management of these comorbidities.

## 2023-10-30 NOTE — Progress Notes (Signed)
 KWALI WRINKLE                                          MRN: [id number]   10/30/2023   The [facility] Quality Team Specialist reviewed this patient medical record for the purposes of chart review for care gap closure. The following were reviewed: abstraction for care gap closure-kidney health evaluation for diabetes:eGFR  and uACR.    [facility] Quality Team

## 2023-11-27 ENCOUNTER — Ambulatory Visit: Attending: Cardiology

## 2023-11-27 DIAGNOSIS — I428 Other cardiomyopathies: Secondary | ICD-10-CM

## 2023-11-27 DIAGNOSIS — I350 Nonrheumatic aortic (valve) stenosis: Secondary | ICD-10-CM

## 2023-11-28 LAB — ECHOCARDIOGRAM COMPLETE
AR max vel: 2.7 cm2
AV Area VTI: 2.77 cm2
AV Area mean vel: 2.76 cm2
AV Mean grad: 13.3 mmHg
AV Peak grad: 25.1 mmHg
Ao pk vel: 2.51 m/s
Area-P 1/2: 3.72 cm2
S' Lateral: 3.1 cm

## 2023-11-29 ENCOUNTER — Ambulatory Visit: Payer: Self-pay | Admitting: Cardiology

## 2023-12-05 ENCOUNTER — Encounter: Payer: Self-pay | Admitting: Internal Medicine

## 2023-12-05 ENCOUNTER — Ambulatory Visit (INDEPENDENT_AMBULATORY_CARE_PROVIDER_SITE_OTHER): Admitting: Internal Medicine

## 2023-12-05 VITALS — BP 112/72 | HR 72 | Temp 97.7°F | Ht 71.0 in | Wt 227.2 lb

## 2023-12-05 DIAGNOSIS — Z7984 Long term (current) use of oral hypoglycemic drugs: Secondary | ICD-10-CM

## 2023-12-05 DIAGNOSIS — N62 Hypertrophy of breast: Secondary | ICD-10-CM | POA: Diagnosis not present

## 2023-12-05 DIAGNOSIS — I442 Atrioventricular block, complete: Secondary | ICD-10-CM | POA: Diagnosis not present

## 2023-12-05 DIAGNOSIS — T50905A Adverse effect of unspecified drugs, medicaments and biological substances, initial encounter: Secondary | ICD-10-CM

## 2023-12-05 DIAGNOSIS — I10 Essential (primary) hypertension: Secondary | ICD-10-CM | POA: Diagnosis not present

## 2023-12-05 DIAGNOSIS — I350 Nonrheumatic aortic (valve) stenosis: Secondary | ICD-10-CM

## 2023-12-05 DIAGNOSIS — E669 Obesity, unspecified: Secondary | ICD-10-CM | POA: Diagnosis not present

## 2023-12-05 DIAGNOSIS — E119 Type 2 diabetes mellitus without complications: Secondary | ICD-10-CM

## 2023-12-05 DIAGNOSIS — E7800 Pure hypercholesterolemia, unspecified: Secondary | ICD-10-CM

## 2023-12-05 DIAGNOSIS — I5020 Unspecified systolic (congestive) heart failure: Secondary | ICD-10-CM

## 2023-12-05 LAB — LIPID PANEL
Cholesterol: 161 mg/dL (ref 0–200)
HDL: 41.5 mg/dL (ref >39.00)
LDL Cholesterol: 97 mg/dL (ref 0–99)
NonHDL: 119.81
Total CHOL/HDL Ratio: 4
Triglycerides: 113 mg/dL (ref 0.0–149.0)
VLDL: 22.6 mg/dL (ref 0.0–40.0)

## 2023-12-05 LAB — COMPREHENSIVE METABOLIC PANEL WITH GFR
ALT: 20 U/L (ref 0–53)
AST: 21 U/L (ref 0–37)
Albumin: 4.5 g/dL (ref 3.5–5.2)
Alkaline Phosphatase: 36 U/L — ABNORMAL LOW (ref 39–117)
BUN: 21 mg/dL (ref 6–23)
CO2: 27 meq/L (ref 19–32)
Calcium: 9.4 mg/dL (ref 8.4–10.5)
Chloride: 105 meq/L (ref 96–112)
Creatinine, Ser: 1.34 mg/dL (ref 0.40–1.50)
GFR: 54.94 mL/min — ABNORMAL LOW (ref >60.00)
Glucose, Bld: 146 mg/dL — ABNORMAL HIGH (ref 70–99)
Potassium: 4.7 meq/L (ref 3.5–5.1)
Sodium: 140 meq/L (ref 135–145)
Total Bilirubin: 0.5 mg/dL (ref 0.2–1.2)
Total Protein: 7.8 g/dL (ref 6.0–8.3)

## 2023-12-05 LAB — HEMOGLOBIN A1C: Hgb A1c MFr Bld: 6.9 % — ABNORMAL HIGH (ref 4.6–6.5)

## 2023-12-05 LAB — LDL CHOLESTEROL, DIRECT: Direct LDL: 105 mg/dL

## 2023-12-05 MED ORDER — METFORMIN HCL ER 500 MG PO TB24: ORAL_TABLET | ORAL | 1 refills | Status: AC

## 2023-12-05 NOTE — Assessment & Plan Note (Signed)
 S/p dual chamber Pacemaker Nov 2023

## 2023-12-05 NOTE — Assessment & Plan Note (Signed)
 Secondary to use of spironolactone .  Breast exa is without masses.  Patient has deferred mammogram

## 2023-12-05 NOTE — Assessment & Plan Note (Signed)
 Nonischemic by diagnostic right/left cardiac catheterization Nov 2024.  Continue aggressive management of weight, hypertension and  diabetes.

## 2023-12-05 NOTE — Assessment & Plan Note (Signed)
 Remains well-controlled on current medications.  hemoglobin A1c  is less than 7.0 . Patient is up-to-date on eye exams and foot exam is normal today. Patient has increased  urine microalbumin to creatinine ratio and is  taking an ARB and SGLT 2 inhibitor  with good tolerance . 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   Lab Results  Component Value Date   HGBA1C 6.9 (H) 12/05/2023   Lab Results  Component Value Date   MICROALBUR 0.7 08/30/2023   MICROALBUR 47.1 (H) 10/29/2014

## 2023-12-05 NOTE — Progress Notes (Signed)
 Subjective:  Patient ID: Adam Becker, male    DOB: 06-Nov-1955  Age: 68 y.o. MRN: [id number]  CC: The primary encounter diagnosis was Obesity, diabetes, and hypertension syndrome (HCC). Diagnoses of Pure hypercholesterolemia, Drug-induced gynecomastia, CHB (complete heart block) (HCC), Aortic stenosis, moderate, and Heart failure with mildly reduced ejection fraction (HFmrEF) (HCC) were also pertinent to this visit.   HPI Adam Becker presents for  Chief Complaint  Patient presents with   Medical Management of Chronic Issues    Nipples tender and sore for about a mnth   1) type 2 DM with neuropathy : He feels generally well, is exercising several times per week and checking blood sugars once daily at variable times.  BS have been under 130 fasting and < 150 post prandially.  Denies any recent hypoglyemic events.  Taking his medications as directed. Following a carbohydrate modified diet 6 days per week. Denies numbness, burning and tingling of extremities. Appetite is good.        2) gynecomastia :  he reports bilateral nipple tenderness for the pas tmonth, without redness or discharge.   no masses felt.  Taking spironolactone   3) gets fatigued walking long distances from the parking lot.  No chest pain . Usually when carrying something heavy   Outpatient Medications Prior to Visit  Medication Sig Dispense Refill   Accu-Chek Softclix Lancets lancets Use twice daily to check blood sugars  E11.69 100 each 12   allopurinol  (ZYLOPRIM ) 100 MG tablet TAKE 1 TABLET BY MOUTH EVERY DAY 90 tablet 3   aspirin  EC 81 MG tablet Take 1 tablet (81 mg total) by mouth daily. 90 tablet 3   Bempedoic Acid  (NEXLETOL ) 180 MG TABS Take 1 tablet (180 mg total) by mouth daily. 90 tablet 3   Blood Glucose Monitoring Suppl (ACCU-CHEK GUIDE ME) w/Device KIT 1 Device by Does not apply route 2 (two) times daily. 1 kit 0   carvedilol  (COREG ) 6.25 MG tablet TAKE 1 TABLET BY MOUTH TWICE A DAY 180 tablet 2    cholecalciferol (VITAMIN D3) 25 MCG (1000 UNIT) tablet Take 1,000 Units by mouth daily.     darunavir  (PREZISTA ) 800 MG tablet Take 1 tablet (800 mg total) by mouth daily. 30 tablet 10   elvitegravir-cobicistat-emtricitabine-tenofovir (GENVOYA ) 150-150-200-10 MG TABS tablet TAKE 1 TABLET BY MOUTH EVERY DAY WITH BREAKFAST 30 tablet 11   ezetimibe  (ZETIA ) 10 MG tablet TAKE 1 TABLET BY MOUTH EVERY DAY 90 tablet 3   gabapentin  (NEURONTIN ) 300 MG capsule Take 2 capsules (600 mg total) by mouth 3 (three) times daily. 180 capsule 5   glucose blood test strip Use to check blood sugars twice daily. ICD-10 E11.69 100 each 12   glucose blood test strip Use twice daily to check blood sugars E11.69 100 each 12   JARDIANCE  10 MG TABS tablet TAKE 1 TABLET BY MOUTH EVERY DAY BEFORE BREAKFAST 90 tablet 1   Lancets (ONETOUCH ULTRASOFT) lancets Use to check blood sugars twice daily. ICD-10 E11.69 100 each 12   loratadine (CLARITIN) 10 MG tablet Take 10 mg by mouth daily.     pantoprazole  (PROTONIX ) 40 MG tablet TAKE 1 TABLET 2 TIMES DAILY FOR 14 DAYS, THEN 1 TABLET DAILY FOR 30 DAYS, THEN 1 TABLET DAILY AS NEEDED 90 tablet 1   rOPINIRole (REQUIP) 1 MG tablet Take 1.5 mg by mouth 4 (four) times daily.     sacubitril-valsartan (ENTRESTO ) 24-26 MG TAKE 1 TABLET BY MOUTH TWICE A DAY 180 tablet  3   spironolactone  (ALDACTONE ) 25 MG tablet Take 1 tablet (25 mg total) by mouth daily. 90 tablet 3   traZODone  (DESYREL ) 50 MG tablet TAKE 1/2 TO 1 TABLET BY MOUTH AT BEDTIME AS NEEDED FOR SLEEP 90 tablet 2   vitamin B-12 (CYANOCOBALAMIN ) 1000 MCG tablet Take 1,000 mcg by mouth daily.     acetaminophen  (TYLENOL ) 500 MG tablet Take 1,000 mg by mouth every 6 (six) hours as needed for moderate pain or headache. (Patient not taking: Reported on 12/05/2023)     metFORMIN  (GLUCOPHAGE -XR) 500 MG 24 hr tablet TAKE 1 TABLET BY MOUTH EVERY DAY WITH BREAKFAST 90 tablet 1   Facility-Administered Medications Prior to Visit  Medication Dose  Route Frequency Provider Last Rate Last Admin   0.9 %  sodium chloride  infusion  500 mL Intravenous Once Stacia Glendia BRAVO, MD        Review of Systems;  Patient denies headache, fevers, malaise, unintentional weight loss, skin rash, eye pain, sinus congestion and sinus pain, sore throat, dysphagia,  hemoptysis , cough, dyspnea, wheezing, chest pain, palpitations, orthopnea, edema, abdominal pain, nausea, melena, diarrhea, constipation, flank pain, dysuria, hematuria, urinary  Frequency, nocturia, numbness, tingling, seizures,  Focal weakness, Loss of consciousness,  Tremor, insomnia, depression, anxiety, and suicidal ideation.      Objective:  BP 112/72   Pulse 72   Temp 97.7 F (36.5 C) (Oral)   Ht 5' 11 (1.803 m)   Wt 227 lb 3.2 oz (103.1 kg)   SpO2 95%   BMI 31.69 kg/m   BP Readings from Last 3 Encounters:  12/05/23 112/72  10/03/23 112/70  09/27/23 100/60    Wt Readings from Last 3 Encounters:  12/05/23 227 lb 3.2 oz (103.1 kg)  10/03/23 229 lb (103.9 kg)  09/27/23 229 lb 8 oz (104.1 kg)    Physical Exam Vitals reviewed.  Constitutional:      General: He is not in acute distress.    Appearance: Normal appearance. He is normal weight. He is not ill-appearing, toxic-appearing or diaphoretic.  HENT:     Head: Normocephalic and atraumatic.     Right Ear: Tympanic membrane, ear canal and external ear normal. There is no impacted cerumen.     Left Ear: Tympanic membrane, ear canal and external ear normal. There is no impacted cerumen.     Nose: Nose normal.     Mouth/Throat:     Mouth: Mucous membranes are moist.     Pharynx: Oropharynx is clear.  Eyes:     General: No scleral icterus.       Right eye: No discharge.        Left eye: No discharge.     Conjunctiva/sclera: Conjunctivae normal.  Neck:     Thyroid : No thyromegaly.     Vascular: No carotid bruit or JVD.  Cardiovascular:     Rate and Rhythm: Normal rate and regular rhythm.     Heart sounds: Normal  heart sounds.  Pulmonary:     Effort: Pulmonary effort is normal. No respiratory distress.     Breath sounds: Normal breath sounds.  Chest:     Chest wall: Swelling present.  Breasts:    Right: No inverted nipple or nipple discharge.     Left: No inverted nipple or nipple discharge.     Comments: Gynecomastia  Abdominal:     General: Bowel sounds are normal.     Palpations: Abdomen is soft. There is no mass.     Tenderness: There  is no abdominal tenderness. There is no guarding or rebound.  Musculoskeletal:        General: Normal range of motion.     Cervical back: Normal range of motion and neck supple.  Lymphadenopathy:     Cervical: No cervical adenopathy.  Skin:    General: Skin is warm and dry.  Neurological:     General: No focal deficit present.     Mental Status: He is alert and oriented to person, place, and time. Mental status is at baseline.  Psychiatric:        Mood and Affect: Mood normal.        Behavior: Behavior normal.        Thought Content: Thought content normal.        Judgment: Judgment normal.     Lab Results  Component Value Date   HGBA1C 6.9 (H) 12/05/2023   HGBA1C 6.1 (A) 08/30/2023   HGBA1C 6.8 (A) 12/26/2022    Lab Results  Component Value Date   CREATININE 1.34 12/05/2023   CREATININE 1.33 08/30/2023   CREATININE 1.46 07/19/2023    Lab Results  Component Value Date   WBC 6.8 07/10/2023   HGB 14.5 07/10/2023   HCT 42.8 07/10/2023   PLT 389.0 07/10/2023   GLUCOSE 146 (H) 12/05/2023   CHOL 161 12/05/2023   TRIG 113.0 12/05/2023   HDL 41.50 12/05/2023   LDLDIRECT 105.0 12/05/2023   LDLCALC 97 12/05/2023   ALT 20 12/05/2023   AST 21 12/05/2023   NA 140 12/05/2023   K 4.7 12/05/2023   CL 105 12/05/2023   CREATININE 1.34 12/05/2023   BUN 21 12/05/2023   CO2 27 12/05/2023   TSH 0.69 08/30/2023   PSA 0.91 02/20/2023   HGBA1C 6.9 (H) 12/05/2023   MICROALBUR 0.7 08/30/2023    CT ABDOMEN PELVIS W CONTRAST Result Date:  10/20/2023 CLINICAL DATA:  Gastric nodule found on EGD EXAM: CT ABDOMEN AND PELVIS WITH CONTRAST TECHNIQUE: Multidetector CT imaging of the abdomen and pelvis was performed using the standard protocol following bolus administration of intravenous contrast. RADIATION DOSE REDUCTION: This exam was performed according to the departmental dose-optimization program which includes automated exposure control, adjustment of the mA and/or kV according to patient size and/or use of iterative reconstruction technique. CONTRAST:  75mL OMNIPAQUE  IOHEXOL  300 MG/ML  SOLN COMPARISON:  None Available. FINDINGS: Lower chest: No acute pleural or parenchymal lung disease. Dual lead cardiac pacer identified. Hepatobiliary: Decreased liver attenuation consistent with hepatic steatosis. No focal liver abnormality. Gallbladder is unremarkable. Pancreas: Unremarkable. No pancreatic ductal dilatation or surrounding inflammatory changes. Spleen: Prior splenectomy. Adrenals/Urinary Tract: Simple appearing bilateral renal cortical cysts do not require specific imaging follow-up. No urinary tract calculi or obstructive uropathy. The adrenals and bladder are unremarkable. Stomach/Bowel: No bowel obstruction or ileus. Normal appendix right lower quadrant. Scattered colonic diverticulosis without evidence of acute diverticulitis. There are no abnormalities identified within the stomach to correspond to the 6 mm nodule seen on recent upper endoscopy. Vascular/Lymphatic: Aortic atherosclerosis. No enlarged abdominal or pelvic lymph nodes. Reproductive: Prostate is unremarkable. Other: No free fluid or free intraperitoneal gas. There are 2 fat containing supraumbilical midline ventral hernias, large is seen superiorly with a 2.7 cm defect in the anterior abdominal wall. No bowel herniation. Small fat containing umbilical hernia as well. Musculoskeletal: No acute or destructive bony abnormalities. Reconstructed images demonstrate no additional  findings. IMPRESSION: 1. No CT findings to correspond to the subcentimeter gastric nodule identified on recent endoscopies. No other  acute gastric findings. 2. Colonic diverticulosis without diverticulitis. 3. Fat containing umbilical and midline supraumbilical ventral hernias as above. No bowel herniation. 4. Hepatic steatosis. 5.  Aortic Atherosclerosis (ICD10-I70.0). Electronically Signed   By: Ozell Daring M.D.   On: 10/20/2023 13:49    Assessment & Plan:  .Obesity, diabetes, and hypertension syndrome (HCC) Assessment & Plan:  Remains well-controlled on current medications.  hemoglobin A1c  is less than 7.0 . Patient is up-to-date on eye exams and foot exam is normal today. Patient has increased  urine microalbumin to creatinine ratio and is  taking an ARB and SGLT 2 inhibitor  with good tolerance . 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   Lab Results  Component Value Date   HGBA1C 6.9 (H) 12/05/2023   Lab Results  Component Value Date   MICROALBUR 0.7 08/30/2023   MICROALBUR 47.1 (H) 10/29/2014       Orders: -     Comprehensive metabolic panel with GFR -     Hemoglobin A1c  Pure hypercholesterolemia -     Lipid panel -     LDL cholesterol, direct  Drug-induced gynecomastia Assessment & Plan: Secondary to use of spironolactone .  Breast exa is without masses.  Patient has deferred mammogram   CHB (complete heart block) [facility]) Assessment & Plan: S/p dual chamber Pacemaker Nov 2023    Aortic stenosis, moderate Assessment & Plan: Found on ECHO during workup for systolic murmur.  Now managed by Cardiology.  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   He is on the appropriate medications and is requesting handicapped parking permit   Heart failure with mildly reduced ejection fraction (HFmrEF) [facility]) Assessment & Plan: Nonischemic by diagnostic right/left cardiac catheterization Nov  2024.  Continue aggressive management of weight, hypertension and  diabetes.    Other orders -     metFORMIN  HCl ER; TAKE 1 TABLET BY MOUTH EVERY DAY WITH BREAKFAST  Dispense: 90 tablet; Refill: 1   Follow-up: Return in about 6 months (around 06/03/2024) for follow up diabetes.   Verneita LITTIE Kettering, MD

## 2023-12-05 NOTE — Assessment & Plan Note (Addendum)
 Found on ECHO during workup for systolic murmur.  Now managed by Cardiology.  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   He is on the appropriate medications and is requesting handicapped parking permit

## 2023-12-05 NOTE — Patient Instructions (Signed)
 Your breast tenderness occurs because your breasts have enlarged.  This is called gynecomastia and is a side effect of spironolactone .  It is not serious and does increase your risk for breast  cancer  If you would like a mammogram to confirm the condition and rule out a mass,  I will be happy to order one

## 2023-12-07 ENCOUNTER — Ambulatory Visit: Payer: Self-pay | Admitting: Internal Medicine

## 2023-12-11 NOTE — Telephone Encounter (Signed)
 open in error

## 2023-12-12 NOTE — Telephone Encounter (Signed)
 open in error

## 2023-12-19 ENCOUNTER — Telehealth: Payer: Self-pay

## 2023-12-19 NOTE — Telephone Encounter (Signed)
 Copied from CRM #[id number]. Topic: General - Other >> Dec 19, 2023  2:17 PM Victoria A wrote: Reason for CRM: Lupita with [facility] called and provided email and call back number if there are any questions regarding office note :  [email or web address]  [phone] Ext [phone]

## 2023-12-27 NOTE — Telephone Encounter (Signed)
 noted

## 2024-01-14 ENCOUNTER — Ambulatory Visit: Payer: Self-pay

## 2024-01-14 ENCOUNTER — Ambulatory Visit

## 2024-01-14 NOTE — Progress Notes (Deleted)
" °  ° ° °  Acute visit   Patient: Adam Becker   DOB: [birth year]   68 y.o. Male  MRN: [id number] PCP: Marylynn Verneita CROME, MD   No chief complaint on file.  Subjective    Discussed the use of AI scribe software for clinical note transcription with the patient, who gave verbal consent to proceed.  History of Present Illness     Review of systems as noted in HPI.   Objective    There were no vitals taken for this visit. Physical Exam    No results found for any visits on 01/14/24.  Assessment & Plan     Problem List Items Addressed This Visit   None   Assessment and Plan Assessment & Plan      No orders of the defined types were placed in this encounter.    No follow-ups on file.      Isaiah DELENA Pepper, MD  [facility] [phone] (phone) [phone] (fax) "

## 2024-01-14 NOTE — Telephone Encounter (Signed)
 FYI Only or Action Required?: FYI only for provider: appointment scheduled on 01/15/2024.  Patient was last seen in primary care on 12/05/2023 by Marylynn Verneita CROME, MD.  Called Nurse Triage reporting Diarrhea.  Symptoms began today.  Interventions attempted: Nothing.  Symptoms are: unchanged.  Triage Disposition: See HCP Within 4 Hours (Or PCP Triage)  Patient/caregiver understands and will follow disposition?: Yes  Copied from CRM [id number]. Topic: Clinical - Red Word Triage >> Jan 14, 2024  1:50 PM Eva FALCON wrote: Red Word that prompted transfer to Nurse Triage: got up this morning, started having diarrhea, drank whole bottle of pepto bismol, forehead is warm, pain in shoulder and back. Is wanting to know if he should go to urgent care or ER? Reason for Disposition  [1] SEVERE diarrhea (e.g., 7 or more times / day more than normal) AND [2] age > 60 years  Answer Assessment - Initial Assessment Questions Head feels warm, pain across his shoulders, aching (5) constant. Drank half a bottle of pepto. Denies urinary changes. 1. DIARRHEA SEVERITY: How bad is the diarrhea? How many more stools have you had in the past 24 hours than normal?      Once or twice and hour since 9 2. ONSET: When did the diarrhea begin?      This morning around 9am 3. STOOL DESCRIPTION:  How loose or watery is the diarrhea? What is the stool color? Is there any blood or mucous in the stool?     Almost like water 4. VOMITING: Are you also vomiting? If Yes, ask: How many times in the past 24 hours?      Denies 5. ABDOMEN PAIN: Are you having any abdomen pain? If Yes, ask: What does it feel like? (e.g., crampy, dull, intermittent, constant)      Denies 6. ABDOMEN PAIN SEVERITY: If present, ask: How bad is the pain?  (e.g., Scale 1-10; mild, moderate, or severe)     Denies 7. ORAL INTAKE: If vomiting, Have you been able to drink liquids? How much liquids have you had in the past 24  hours?     Denies 8. HYDRATION: Any signs of dehydration? (e.g., dry mouth [not just dry lips], too weak to stand, dizziness, new weight loss) When did you last urinate?     Denies 9. EXPOSURE: Have you traveled to a foreign country recently? Have you been exposed to anyone with diarrhea? Could you have eaten any food that was spoiled?     Denies 10. ANTIBIOTIC USE: Are you taking antibiotics now or have you taken antibiotics in the past 2 months?       Denies 11. OTHER SYMPTOMS: Do you have any other symptoms? (e.g., fever, blood in stool)    Shoulder pain,  Protocols used: Diarrhea-A-AH

## 2024-01-14 NOTE — Telephone Encounter (Signed)
 Pt is scheduled for today with Dr. Franchot

## 2024-01-22 ENCOUNTER — Ambulatory Visit: Admitting: *Deleted

## 2024-01-22 VITALS — BP 118/77 | Ht 71.0 in | Wt 228.0 lb

## 2024-01-22 DIAGNOSIS — Z0000 Encounter for general adult medical examination without abnormal findings: Secondary | ICD-10-CM | POA: Diagnosis not present

## 2024-01-22 NOTE — Patient Instructions (Signed)
 Adam Becker,  Thank you for taking the time for your Medicare Wellness Visit. I appreciate your continued commitment to your health goals. Please review the care plan we discussed, and feel free to reach out if I can assist you further.  Please note that Annual Wellness Visits do not include a physical exam. Some assessments may be limited, especially if the visit was conducted virtually. If needed, we may recommend an in-person follow-up with your provider.  Ongoing Care Seeing your primary care provider every 3 to 6 months helps us  monitor your health and provide consistent, personalized care.  Remember to keep your vaccines up to date.   Referrals If a referral was made during today's visit and you haven't received any updates within two weeks, please contact the referred provider directly to check on the status.  Recommended Screenings:  Health Maintenance  Topic Date Due   Meningitis B Vaccine (1 of 4 - Increased Risk) Never done   COVID-19 Vaccine (9 - Pfizer risk 2025-26 season) 04/21/2024   Hemoglobin A1C  06/03/2024   Yearly kidney health urinalysis for diabetes  08/29/2024   Complete foot exam   08/29/2024   Eye exam for diabetics  09/16/2024   Pneumococcal Vaccine for age over 24 (4 of 4 - PCV20 or PCV21) 09/22/2024   Yearly kidney function blood test for diabetes  12/04/2024   Medicare Annual Wellness Visit  01/21/2025   Colon Cancer Screening  12/21/2025   DTaP/Tdap/Td vaccine (2 - Td or Tdap) 10/22/2030   Flu Shot  Completed   Hepatitis C Screening  Completed   Zoster (Shingles) Vaccine  Completed   Hepatitis B Vaccine  Discontinued       01/22/2024    9:42 AM  Advanced Directives  Does Patient Have a Medical Advance Directive? No  Would patient like information on creating a medical advance directive? No - Patient declined    Vision: Annual vision screenings are recommended for early detection of glaucoma, cataracts, and diabetic retinopathy. These exams can  also reveal signs of chronic conditions such as diabetes and high blood pressure.  Dental: Annual dental screenings help detect early signs of oral cancer, gum disease, and other conditions linked to overall health, including heart disease and diabetes.  Please see the attached documents for additional preventive care recommendations.

## 2024-01-22 NOTE — Progress Notes (Signed)
 "  Chief Complaint  Patient presents with   Medicare Wellness     Subjective:   Adam Becker is a 68 y.o. male who presents for a Medicare Annual Wellness Visit.  Visit info / Clinical Intake: Medicare Wellness Visit Type:: Subsequent Annual Wellness Visit Persons participating in visit and providing information:: patient Medicare Wellness Visit Mode:: Telephone If telephone:: video declined Since this visit was completed virtually, some vitals may be partially provided or unavailable. Missing vitals are due to the limitations of the virtual format.: Documented vitals are patient reported If Telephone or Video please confirm:: I connected with patient using audio/video enable telemedicine. I verified patient identity with two identifiers, discussed telehealth limitations, and patient agreed to proceed. Patient Location:: Home Provider Location:: Office/Home Interpreter Needed?: No Pre-visit prep was completed: yes AWV questionnaire completed by patient prior to visit?: no Living arrangements:: (!) lives alone Patient's Overall Health Status Rating: good Typical amount of pain: none Does pain affect daily life?: no Are you currently prescribed opioids?: no  Dietary Habits and Nutritional Risks How many meals a day?: 2 Eats fruit and vegetables daily?: yes Most meals are obtained by: preparing own meals In the last 2 weeks, have you had any of the following?: (!) nausea, vomiting, diarrhea (little diarrhea a week ago for a day, fine now) Diabetic:: (!) yes Any non-healing wounds?: no How often do you check your BS?: 1 Would you like to be referred to a Nutritionist or for Diabetic Management? : no  Functional Status Activities of Daily Living (to include ambulation/medication): Independent Ambulation: Independent Medication Administration: Independent Home Management (perform basic housework or laundry): Independent Manage your own finances?: yes Primary transportation  is: driving Concerns about vision?: no *vision screening is required for WTM* Concerns about hearing?: no  Fall Screening Falls in the past year?: 0 Number of falls in past year: 0 Was there an injury with Fall?: 0 Fall Risk Category Calculator: 0 Patient Fall Risk Level: Low Fall Risk  Fall Risk Patient at Risk for Falls Due to: No Fall Risks Fall risk Follow up: Falls evaluation completed; Falls prevention discussed  Home and Transportation Safety: All rugs have non-skid backing?: yes All stairs or steps have railings?: yes Grab bars in the bathtub or shower?: (!) no Have non-skid surface in bathtub or shower?: (!) no Good home lighting?: yes Regular seat belt use?: yes Hospital stays in the last year:: no  Cognitive Assessment Difficulty concentrating, remembering, or making decisions? : no Will 6CIT or Mini Cog be Completed: yes What year is it?: 0 points What month is it?: 0 points Give patient an address phrase to remember (5 components): [address] [name] About what time is it?: 0 points Count backwards from 20 to 1: 0 points Say the months of the year in reverse: 0 points Repeat the address phrase from earlier: 2 points 6 CIT Score: 2 points  Advance Directives (For Healthcare) Does Patient Have a Medical Advance Directive?: No Would patient like information on creating a medical advance directive?: No - Patient declined  Reviewed/Updated  Reviewed/Updated: Reviewed All (Medical, Surgical, Family, Medications, Allergies, Care Teams, Patient Goals)    Allergies (verified) Pravastatin    Current Medications (verified) Outpatient Encounter Medications as of 01/22/2024  Medication Sig   Accu-Chek Softclix Lancets lancets Use twice daily to check blood sugars  E11.69   acetaminophen  (TYLENOL ) 500 MG tablet Take 1,000 mg by mouth every 6 (six) hours as needed for moderate pain or headache.  allopurinol  (ZYLOPRIM ) 100 MG tablet TAKE 1 TABLET BY MOUTH  EVERY DAY   aspirin  EC 81 MG tablet Take 1 tablet (81 mg total) by mouth daily.   Bempedoic Acid  (NEXLETOL ) 180 MG TABS Take 1 tablet (180 mg total) by mouth daily.   Blood Glucose Monitoring Suppl (ACCU-CHEK GUIDE ME) w/Device KIT 1 Device by Does not apply route 2 (two) times daily.   carvedilol  (COREG ) 6.25 MG tablet TAKE 1 TABLET BY MOUTH TWICE A DAY   cholecalciferol (VITAMIN D3) 25 MCG (1000 UNIT) tablet Take 1,000 Units by mouth daily.   darunavir  (PREZISTA ) 800 MG tablet Take 1 tablet (800 mg total) by mouth daily.   elvitegravir-cobicistat-emtricitabine-tenofovir (GENVOYA ) 150-150-200-10 MG TABS tablet TAKE 1 TABLET BY MOUTH EVERY DAY WITH BREAKFAST   ezetimibe  (ZETIA ) 10 MG tablet TAKE 1 TABLET BY MOUTH EVERY DAY   gabapentin  (NEURONTIN ) 300 MG capsule Take 2 capsules (600 mg total) by mouth 3 (three) times daily.   glucose blood test strip Use to check blood sugars twice daily. ICD-10 E11.69   glucose blood test strip Use twice daily to check blood sugars E11.69   JARDIANCE  10 MG TABS tablet TAKE 1 TABLET BY MOUTH EVERY DAY BEFORE BREAKFAST   Lancets (ONETOUCH ULTRASOFT) lancets Use to check blood sugars twice daily. ICD-10 E11.69   loratadine (CLARITIN) 10 MG tablet Take 10 mg by mouth daily.   metFORMIN  (GLUCOPHAGE -XR) 500 MG 24 hr tablet TAKE 1 TABLET BY MOUTH EVERY DAY WITH BREAKFAST   pantoprazole  (PROTONIX ) 40 MG tablet TAKE 1 TABLET 2 TIMES DAILY FOR 14 DAYS, THEN 1 TABLET DAILY FOR 30 DAYS, THEN 1 TABLET DAILY AS NEEDED   rOPINIRole (REQUIP) 1 MG tablet Take 1.5 mg by mouth 4 (four) times daily.   sacubitril-valsartan (ENTRESTO ) 24-26 MG TAKE 1 TABLET BY MOUTH TWICE A DAY   spironolactone  (ALDACTONE ) 25 MG tablet Take 1 tablet (25 mg total) by mouth daily.   traZODone  (DESYREL ) 50 MG tablet TAKE 1/2 TO 1 TABLET BY MOUTH AT BEDTIME AS NEEDED FOR SLEEP   vitamin B-12 (CYANOCOBALAMIN ) 1000 MCG tablet Take 1,000 mcg by mouth daily.   Facility-Administered Encounter Medications  as of 01/22/2024  Medication   0.9 %  sodium chloride  infusion    History: Past Medical History:  Diagnosis Date   1st degree AV block    seen on ekg   Asplenia 11/11/2014   Chronic kidney disease 11/2011   admission for [facility] cr 11, acidosis   Diabetes mellitus without complication (HCC)    Type II   GERD (gastroesophageal reflux disease)    Gout    Heart murmur    HIV disease (HCC) 06/21/2022   HIV infection (HCC) 1994   Hyperglycemia 04/04/2016   Hyperlipidemia    Hypertension    Impaired fasting glucose 07/27/2015   LLL pneumonia 07/06/2021   Myopathy 06/21/2021   Neuromuscular disorder (HCC)    peripheral neuropathy   Neuropathy    TIA (transient ischemic attack) 08/18/2017   Umbilical hernia    Urethritis 02/11/2015   Past Surgical History:  Procedure Laterality Date   COLONOSCOPY     LIPOSUCTION HEAD / NECK  2001   posterior neck   MUSCLE BIOPSY Right 04/01/2021   Procedure: MUSCLE BIOPSY;  Surgeon: Dessa Reyes ORN, MD;  Location: [facility] ORS;  Service: General;  Laterality: Right;  gastrocnemius; O.R. to notify pathology of procedure   RIGHT/LEFT HEART CATH AND CORONARY ANGIOGRAPHY Bilateral 12/15/2022   Procedure: RIGHT/LEFT HEART CATH AND CORONARY  ANGIOGRAPHY;  Surgeon: Mady Bruckner, MD;  Location: [facility] INVASIVE [facility];  Service: Cardiovascular;  Laterality: Bilateral;   SPLENECTOMY  2000   reason unclear stopped working   Family History  Problem Relation Age of Onset   Breast cancer Mother    Colon polyps Father    Pancreatic cancer Father    Birth defects Son        Breast    Rectal cancer Neg Hx    Stomach cancer Neg Hx    Esophageal cancer Neg Hx    Social History   Occupational History   Occupation: retired games developer  Tobacco Use   Smoking status: Never   Smokeless tobacco: Never  Vaping Use   Vaping status: Never Used  Substance and Sexual Activity   Alcohol use: No   Drug use: No   Sexual activity: Not Currently   Tobacco  Counseling Counseling given: Not Answered  SDOH Screenings   Food Insecurity: No Food Insecurity (01/22/2024)  Housing: Low Risk (01/22/2024)  Transportation Needs: No Transportation Needs (01/22/2024)  Utilities: Not At Risk (01/22/2024)  Alcohol Screen: Low Risk (01/22/2024)  Depression (PHQ2-9): Low Risk (01/22/2024)  Financial Resource Strain: Low Risk (01/22/2024)  Physical Activity: Sufficiently Active (01/22/2024)  Social Connections: Moderately Integrated (01/22/2024)  Stress: No Stress Concern Present (01/22/2024)  Tobacco Use: Low Risk (01/22/2024)  Health Literacy: Adequate Health Literacy (01/22/2024)   See flowsheets for full screening details  Depression Screen PHQ 2 & 9 Depression Scale- Over the past 2 weeks, how often have you been bothered by any of the following problems? Little interest or pleasure in doing things: 0 Feeling down, depressed, or hopeless (PHQ Adolescent also includes...irritable): 0 PHQ-2 Total Score: 0 Trouble falling or staying asleep, or sleeping too much: 0 Feeling tired or having little energy: 0 Poor appetite or overeating (PHQ Adolescent also includes...weight loss): 0 Feeling bad about yourself - or that you are a failure or have let yourself or your family down: 0 Trouble concentrating on things, such as reading the newspaper or watching television (PHQ Adolescent also includes...like school work): 0 Moving or speaking so slowly that other people could have noticed. Or the opposite - being so fidgety or restless that you have been moving around a lot more than usual: 0 Thoughts that you would be better off dead, or of hurting yourself in some way: 0 PHQ-9 Total Score: 0 If you checked off any problems, how difficult have these problems made it for you to do your work, take care of things at home, or get along with other people?: Not difficult at all     Goals Addressed             This Visit's Progress    Patient Stated        Wants to lose more weight             Objective:    Today's Vitals   01/22/24 0937  BP: 118/77  Weight: 228 lb (103.4 kg)  Height: 5' 11 (1.803 m)   Body mass index is 31.8 kg/m.  Hearing/Vision screen Hearing Screening - Comments:: No issues Vision Screening - Comments:: Glasses, [employer], up to date Immunizations and Health Maintenance Health Maintenance  Topic Date Due   Meningococcal B Vaccine (1 of 4 - Increased Risk) Never done   COVID-19 Vaccine (9 - Pfizer risk 2025-26 season) 04/21/2024   HEMOGLOBIN A1C  06/03/2024   Diabetic kidney evaluation - Urine ACR  08/29/2024   FOOT  EXAM  08/29/2024   OPHTHALMOLOGY EXAM  09/16/2024   Pneumococcal Vaccine: 50+ Years (4 of 4 - PCV20 or PCV21) 09/22/2024   Diabetic kidney evaluation - eGFR measurement  12/04/2024   Medicare Annual Wellness (AWV)  01/21/2025   Colonoscopy  12/21/2025   DTaP/Tdap/Td (2 - Td or Tdap) 10/22/2030   Influenza Vaccine  Completed   Hepatitis C Screening  Completed   Zoster Vaccines- Shingrix  Completed   Hepatitis B Vaccines 19-59 Average Risk  Discontinued        Assessment/Plan:  This is a routine wellness examination for Troy.  Patient Care Team: Marylynn Verneita CROME, MD as PCP - General (Internal Medicine) Fleeta Rothman, Jomarie SAILOR, MD as PCP - Infectious Diseases (Infectious Diseases) Darliss Rogue, MD as PCP - Cardiology (Cardiology) Vivian Barefoot, Lavern Backers, MD as Referring Physician (Cardiology) Maree Jannett POUR, MD as Consulting Physician (Neurology)  I have personally reviewed and noted the following in the patients chart:   Medical and social history Use of alcohol, tobacco or illicit drugs  Current medications and supplements including opioid prescriptions. Functional ability and status Nutritional status Physical activity Advanced directives List of other physicians Hospitalizations, surgeries, and ER visits in previous 12 months Vitals Screenings to include  cognitive, depression, and falls Referrals and appointments  No orders of the defined types were placed in this encounter.  In addition, I have reviewed and discussed with patient certain preventive protocols, quality metrics, and best practice recommendations. A written personalized care plan for preventive services as well as general preventive health recommendations were provided to patient.   Angeline Fredericks, LPN   [id number]   Return in 1 year (on 01/21/2025).  After Visit Summary: (MyChart) Due to this being a telephonic visit, the after visit summary with patients personalized plan was offered to patient via MyChart   Nurse Notes: Discussed the need to keep vaccines up to date.  "

## 2024-01-29 ENCOUNTER — Encounter: Payer: Self-pay | Admitting: Emergency Medicine

## 2024-01-29 ENCOUNTER — Other Ambulatory Visit: Payer: Self-pay

## 2024-01-29 ENCOUNTER — Emergency Department
Admission: EM | Admit: 2024-01-29 | Discharge: 2024-01-29 | Disposition: A | Discharge status: Home / Self Care | Attending: Emergency Medicine | Admitting: Emergency Medicine

## 2024-01-29 DIAGNOSIS — Z7982 Long term (current) use of aspirin: Secondary | ICD-10-CM | POA: Diagnosis not present

## 2024-01-29 DIAGNOSIS — Z7984 Long term (current) use of oral hypoglycemic drugs: Secondary | ICD-10-CM | POA: Insufficient documentation

## 2024-01-29 DIAGNOSIS — E1142 Type 2 diabetes mellitus with diabetic polyneuropathy: Secondary | ICD-10-CM | POA: Diagnosis not present

## 2024-01-29 DIAGNOSIS — Z21 Asymptomatic human immunodeficiency virus [HIV] infection status: Secondary | ICD-10-CM | POA: Insufficient documentation

## 2024-01-29 DIAGNOSIS — N189 Chronic kidney disease, unspecified: Secondary | ICD-10-CM | POA: Insufficient documentation

## 2024-01-29 DIAGNOSIS — Z955 Presence of coronary angioplasty implant and graft: Secondary | ICD-10-CM | POA: Diagnosis not present

## 2024-01-29 DIAGNOSIS — R197 Diarrhea, unspecified: Secondary | ICD-10-CM | POA: Insufficient documentation

## 2024-01-29 DIAGNOSIS — I130 Hypertensive heart and chronic kidney disease with heart failure and stage 1 through stage 4 chronic kidney disease, or unspecified chronic kidney disease: Secondary | ICD-10-CM | POA: Diagnosis not present

## 2024-01-29 DIAGNOSIS — I509 Heart failure, unspecified: Secondary | ICD-10-CM | POA: Diagnosis not present

## 2024-01-29 DIAGNOSIS — Z79899 Other long term (current) drug therapy: Secondary | ICD-10-CM | POA: Insufficient documentation

## 2024-01-29 LAB — CBC WITH DIFFERENTIAL/PLATELET
Abs Immature Granulocytes: 0.09 K/uL — ABNORMAL HIGH (ref 0.00–0.07)
Basophils Absolute: 0 K/uL (ref 0.0–0.1)
Basophils Relative: 0 %
Eosinophils Absolute: 1.1 K/uL — ABNORMAL HIGH (ref 0.0–0.5)
Eosinophils Relative: 7 %
HCT: 42 % (ref 39.0–52.0)
Hemoglobin: 14.2 g/dL (ref 13.0–17.0)
Immature Granulocytes: 1 %
Lymphocytes Relative: 32 %
Lymphs Abs: 5.1 K/uL — ABNORMAL HIGH (ref 0.7–4.0)
MCH: 33.6 pg (ref 26.0–34.0)
MCHC: 33.8 g/dL (ref 30.0–36.0)
MCV: 99.3 fL (ref 80.0–100.0)
Monocytes Absolute: 1.9 K/uL — ABNORMAL HIGH (ref 0.1–1.0)
Monocytes Relative: 12 %
Neutro Abs: 7.6 K/uL (ref 1.7–7.7)
Neutrophils Relative %: 48 %
Platelets: 447 K/uL — ABNORMAL HIGH (ref 150–400)
RBC: 4.23 MIL/uL (ref 4.22–5.81)
RDW: 14.4 % (ref 11.5–15.5)
WBC: 15.8 K/uL — ABNORMAL HIGH (ref 4.0–10.5)
nRBC: 0 % (ref 0.0–0.2)

## 2024-01-29 LAB — COMPREHENSIVE METABOLIC PANEL WITH GFR
ALT: 18 U/L (ref 0–44)
AST: 24 U/L (ref 15–41)
Albumin: 4.2 g/dL (ref 3.5–5.0)
Alkaline Phosphatase: 59 U/L (ref 38–126)
Anion gap: 11 (ref 5–15)
BUN: 13 mg/dL (ref 8–23)
CO2: 26 mmol/L (ref 22–32)
Calcium: 10.1 mg/dL (ref 8.9–10.3)
Chloride: 103 mmol/L (ref 98–111)
Creatinine, Ser: 1.16 mg/dL (ref 0.61–1.24)
GFR, Estimated: >60 mL/min
Glucose, Bld: 150 mg/dL — ABNORMAL HIGH (ref 70–99)
Potassium: 4 mmol/L (ref 3.5–5.1)
Sodium: 140 mmol/L (ref 135–145)
Total Bilirubin: 0.3 mg/dL (ref 0.0–1.2)
Total Protein: 7.9 g/dL (ref 6.5–8.1)

## 2024-01-29 LAB — LIPASE, BLOOD: Lipase: 40 U/L (ref 11–51)

## 2024-01-29 MED ORDER — DIPHENOXYLATE-ATROPINE 2.5-0.025 MG PO TABS
1.0000 | ORAL_TABLET | Freq: Once | ORAL | Status: AC
  Administered 2024-01-29: 1 via ORAL
  Filled 2024-01-29: qty 1

## 2024-01-29 MED ORDER — DIPHENOXYLATE-ATROPINE 2.5-0.025 MG PO TABS: 1.0000 | ORAL_TABLET | Freq: Four times a day (QID) | ORAL | 0 refills | Status: AC | PRN

## 2024-01-29 NOTE — ED Provider Notes (Signed)
 "  [facility] Provider Note    Event Date/Time   First MD Initiated Contact with Patient 01/29/24 [phone]     (approximate)   History   Diarrhea   HPI  CRIT OBREMSKI is a 69 y.o. male with history of HIV, hypertension, hyperlipidemia, TIA, complete heart block status post pacemaker, CHF who presents to the emergency department with complaints of diarrhea for several days.  No bloody stools or melena.  No nausea or vomiting.  No abdominal pain.  No fever.  No recent travel, sick contact or antibiotic use.  No recent hospitalization.  Has used Imodium x 3 with improvement of symptoms.  Has had previous splenectomy.  No other abdominal surgery.   History provided by patient.    Past Medical History:  Diagnosis Date   1st degree AV block    seen on ekg   Asplenia 11/11/2014   Chronic kidney disease 11/2011   admission for ATN cr 11, acidosis   Diabetes mellitus without complication (HCC)    Type II   GERD (gastroesophageal reflux disease)    Gout    Heart murmur    HIV disease (HCC) 06/21/2022   HIV infection (HCC) 1994   Hyperglycemia 04/04/2016   Hyperlipidemia    Hypertension    Impaired fasting glucose 07/27/2015   LLL pneumonia 07/06/2021   Myopathy 06/21/2021   Neuromuscular disorder (HCC)    peripheral neuropathy   Neuropathy    TIA (transient ischemic attack) 08/18/2017   Umbilical hernia    Urethritis 02/11/2015    Past Surgical History:  Procedure Laterality Date   COLONOSCOPY     LIPOSUCTION HEAD / NECK  2001   posterior neck   MUSCLE BIOPSY Right 04/01/2021   Procedure: MUSCLE BIOPSY;  Surgeon: Dessa Reyes ORN, MD;  Location: [facility] ORS;  Service: General;  Laterality: Right;  gastrocnemius; O.R. to notify pathology of procedure   RIGHT/LEFT HEART CATH AND CORONARY ANGIOGRAPHY Bilateral 12/15/2022   Procedure: RIGHT/LEFT HEART CATH AND CORONARY ANGIOGRAPHY;  Surgeon: Mady Bruckner, MD;  Location: [facility] INVASIVE CV LAB;   Service: Cardiovascular;  Laterality: Bilateral;   SPLENECTOMY  2000   reason unclear stopped working    MEDICATIONS:  Prior to Admission medications  Medication Sig Start Date End Date Taking? Authorizing Provider  Accu-Chek Softclix Lancets lancets Use twice daily to check blood sugars  E11.69 07/20/22   Marylynn Verneita CROME, MD  acetaminophen  (TYLENOL ) 500 MG tablet Take 1,000 mg by mouth every 6 (six) hours as needed for moderate pain or headache.    [provider]  allopurinol  (ZYLOPRIM ) 100 MG tablet TAKE 1 TABLET BY MOUTH EVERY DAY 03/20/23   Marylynn Verneita CROME, MD  aspirin  EC 81 MG tablet Take 1 tablet (81 mg total) by mouth daily. 08/22/18   Guse, Tinnie HERO, FNP  Bempedoic Acid  (NEXLETOL ) 180 MG TABS Take 1 tablet (180 mg total) by mouth daily. 04/17/23   Darliss Rogue, MD  Blood Glucose Monitoring Suppl (ACCU-CHEK GUIDE ME) w/Device KIT 1 Device by Does not apply route 2 (two) times daily. 10/02/21   Webb, Padonda B, FNP  carvedilol  (COREG ) 6.25 MG tablet TAKE 1 TABLET BY MOUTH TWICE A DAY 10/03/23   Darliss Rogue, MD  cholecalciferol (VITAMIN D3) 25 MCG (1000 UNIT) tablet Take 1,000 Units by mouth daily.    [provider]  darunavir  (PREZISTA ) 800 MG tablet Take 1 tablet (800 mg total) by mouth daily. 07/25/23   Fleeta Kathie Jomarie LOISE, MD  elvitegravir-cobicistat-emtricitabine-tenofovir (GENVOYA ) 150-150-200-10 MG TABS tablet TAKE 1 TABLET BY MOUTH EVERY DAY WITH BREAKFAST 07/02/23   Fleeta Rothman, Jomarie SAILOR, MD  ezetimibe  (ZETIA ) 10 MG tablet TAKE 1 TABLET BY MOUTH EVERY DAY 06/12/23   Marylynn Verneita CROME, MD  gabapentin  (NEURONTIN ) 300 MG capsule Take 2 capsules (600 mg total) by mouth 3 (three) times daily. 08/30/23   Marylynn Verneita CROME, MD  glucose blood test strip Use to check blood sugars twice daily. ICD-10 E11.69 07/26/22   Marylynn Verneita CROME, MD  glucose blood test strip Use twice daily to check blood sugars E11.69 02/06/23   Marylynn Verneita CROME, MD  JARDIANCE  10 MG TABS tablet TAKE 1  TABLET BY MOUTH EVERY DAY BEFORE BREAKFAST 10/04/23   Marylynn Verneita CROME, MD  Lancets [facility] ULTRASOFT) lancets Use to check blood sugars twice daily. ICD-10 E11.69 07/26/22   Marylynn Verneita CROME, MD  loratadine (CLARITIN) 10 MG tablet Take 10 mg by mouth daily.    [provider]  metFORMIN  (GLUCOPHAGE -XR) 500 MG 24 hr tablet TAKE 1 TABLET BY MOUTH EVERY DAY WITH BREAKFAST 12/05/23   [name], Teresa L, MD  pantoprazole  (PROTONIX ) 40 MG tablet TAKE 1 TABLET 2 TIMES DAILY FOR 14 DAYS, THEN 1 TABLET DAILY FOR 30 DAYS, THEN 1 TABLET DAILY AS NEEDED 10/16/23   [name], Teresa L, MD  rOPINIRole (REQUIP) 1 MG tablet Take 1.5 mg by mouth 4 (four) times daily. 06/02/20   [provider]  sacubitril-valsartan (ENTRESTO ) 24-26 MG TAKE 1 TABLET BY MOUTH TWICE A DAY 10/04/23   Darliss Rogue, MD  spironolactone  (ALDACTONE ) 25 MG tablet Take 1 tablet (25 mg total) by mouth daily. 04/16/23   Darliss Rogue, MD  traZODone  (DESYREL ) 50 MG tablet TAKE 1/2 TO 1 TABLET BY MOUTH AT BEDTIME AS NEEDED FOR SLEEP 07/16/23   Marylynn Verneita CROME, MD  vitamin B-12 (CYANOCOBALAMIN ) 1000 MCG tablet Take 1,000 mcg by mouth daily.    [provider]    Physical Exam   Triage Vital Signs: ED Triage Vitals  Encounter Vitals Group     BP 01/29/24 0257 121/80     Girls Systolic BP Percentile --      Girls Diastolic BP Percentile --      Boys Systolic BP Percentile --      Boys Diastolic BP Percentile --      Pulse Rate 01/29/24 0257 80     Resp 01/29/24 0257 18     Temp 01/29/24 0257 97.6 F (36.4 C)     Temp Source 01/29/24 0257 Oral     SpO2 01/29/24 0257 95 %     Weight 01/29/24 0257 228 lb (103.4 kg)     Height 01/29/24 0257 5' 11 (1.803 m)     Head Circumference --      Peak Flow --      Pain Score 01/29/24 0307 0     Pain Loc --      Pain Education --      Exclude from Growth Chart --     Most recent vital signs: Vitals:   01/29/24 0257 01/29/24 0500  BP: 121/80 121/81  Pulse: 80 73   Resp: 18   Temp: 97.6 F (36.4 C)   SpO2: 95% 94%    CONSTITUTIONAL: Alert, responds appropriately to questions. Well-appearing; well-nourished HEAD: Normocephalic, atraumatic EYES: Conjunctivae clear, pupils appear equal, sclera nonicteric ENT: normal nose; moist mucous membranes NECK: Supple, normal ROM CARD: RRR; S1 and S2 appreciated RESP: Normal chest excursion  without splinting or tachypnea; breath sounds clear and equal bilaterally; no wheezes, no rhonchi, no rales, no hypoxia or respiratory distress, speaking full sentences ABD/GI: Non-distended; soft, non-tender, no rebound, no guarding, no peritoneal signs BACK: The back appears normal EXT: Normal ROM in all joints; no deformity noted, no edema SKIN: Normal color for age and race; warm; no rash on exposed skin NEURO: Moves all extremities equally, normal speech PSYCH: The patient's mood and manner are appropriate.   ED Results / Procedures / Treatments   LABS: (all labs ordered are listed, but only abnormal results are displayed) Labs Reviewed  CBC WITH DIFFERENTIAL/PLATELET - Abnormal; Notable for the following components:      Result Value   WBC 15.8 (*)    Platelets 447 (*)    Lymphs Abs 5.1 (*)    Monocytes Absolute 1.9 (*)    Eosinophils Absolute 1.1 (*)    Abs Immature Granulocytes 0.09 (*)    All other components within normal limits  COMPREHENSIVE METABOLIC PANEL WITH GFR - Abnormal; Notable for the following components:   Glucose, Bld 150 (*)    All other components within normal limits  LIPASE, BLOOD  URINALYSIS, ROUTINE W REFLEX MICROSCOPIC     EKG:  EKG Interpretation Date/Time:    Ventricular Rate:    PR Interval:    QRS Duration:    QT Interval:    QTC Calculation:   R Axis:      Text Interpretation:           RADIOLOGY: My personal review and interpretation of imaging:    I have personally reviewed all radiology reports.   No results found.   PROCEDURES:  Critical Care  performed: No    Procedures    IMPRESSION / MDM / ASSESSMENT AND PLAN / ED COURSE  I reviewed the triage vital signs and the nursing notes.    Patient here with complaints of diarrhea for several days.    DIFFERENTIAL DIAGNOSIS (includes but not limited to):   Infectious diarrhea, viral gastroenteritis, doubt colitis, diverticulitis, perforation given benign exam, no signs clinically of dehydration   Patient's presentation is most consistent with acute presentation with potential threat to life or bodily function.   PLAN: Labs obtained from triage show slight leukocytosis.  Normal electrolytes, hemoglobin, LFTs and lipase.  Abdominal exam here is benign.  Patient does have history of HIV but is on Genvoya  and his last CD4 count in May was normal.  He is immunocompetent.  No indication to obtain CT imaging of his abdomen given no pain and benign exam here.  Discussed with patient that in an immunocompetent patient without risk factors for C. difficile or recent travel, no indication for stool cultures from the ED or starting him on antibiotics.  Recommended diet changes, probiotics and will discharge with prescription for Lomotil .  Have recommended close follow-up with his PCP.  It does appear that he contacted them on 01/14/2024 for diarrhea and they ordered stool studies but does not look like those have been sent.  He is extremely well-appearing and well-hydrated here.  I do not feel further emergent workup is needed.  Patient is comfortable with this plan.   MEDICATIONS GIVEN IN ED: Medications  diphenoxylate -atropine  (LOMOTIL ) 2.5-0.025 MG per tablet 1 tablet (1 tablet Oral Given 01/29/24 0503)     ED COURSE:  At this time, I do not feel there is any life-threatening condition present. I reviewed all nursing notes, vitals, pertinent previous records.  All lab  and urine results, EKGs, imaging ordered have been independently reviewed and interpreted by myself.  I reviewed all  available radiology reports from any imaging ordered this visit.  Based on my assessment, I feel the patient is safe to be discharged home without further emergent workup and can continue workup as an outpatient as needed. Discussed all findings, treatment plan as well as usual and customary return precautions.  They verbalize understanding and are comfortable with this plan.  Outpatient follow-up has been provided as needed.  All questions have been answered.    CONSULTS:  none   OUTSIDE RECORDS REVIEWED: Reviewed last family medicine notes.       FINAL CLINICAL IMPRESSION(S) / ED DIAGNOSES   Final diagnoses:  Diarrhea in adult patient     Rx / DC Orders   ED Discharge Orders          Ordered    diphenoxylate -atropine  (LOMOTIL ) 2.5-0.025 MG tablet  4 times daily PRN        01/29/24 0502             Note:  This document was prepared using Dragon voice recognition software and may include unintentional dictation errors.   Caleb Prigmore, Josette SAILOR, DO 01/29/24 0730  "

## 2024-01-29 NOTE — ED Triage Notes (Signed)
 Patient ambulatory to triage with steady gait, without difficulty or distress noted; pt reports diarrhea since Sunday; denies any accomp symptoms

## 2024-02-08 ENCOUNTER — Other Ambulatory Visit: Payer: Self-pay | Admitting: Internal Medicine

## 2024-06-09 ENCOUNTER — Ambulatory Visit: Admitting: Internal Medicine

## 2025-01-26 ENCOUNTER — Ambulatory Visit
